# Patient Record
Sex: Male | Born: 1985
Health system: Southern US, Community
[De-identification: ages and names within clinical notes are randomized; demographics above are authoritative.]

## PROBLEM LIST (undated history)

## (undated) DIAGNOSIS — R7989 Other specified abnormal findings of blood chemistry: Secondary | ICD-10-CM

## (undated) DIAGNOSIS — M549 Dorsalgia, unspecified: Secondary | ICD-10-CM

## (undated) DIAGNOSIS — F1910 Other psychoactive substance abuse, uncomplicated: Secondary | ICD-10-CM

## (undated) DIAGNOSIS — F1990 Other psychoactive substance use, unspecified, uncomplicated: Secondary | ICD-10-CM

## (undated) DIAGNOSIS — R945 Abnormal results of liver function studies: Secondary | ICD-10-CM

---
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
## 2009-08-03 ENCOUNTER — Emergency Department (HOSPITAL_COMMUNITY): Admission: EM | Admit: 2009-08-03 | Discharge: 2009-08-04 | Payer: Self-pay | Admitting: Emergency Medicine

## 2009-11-16 ENCOUNTER — Emergency Department (HOSPITAL_COMMUNITY): Admission: EM | Admit: 2009-11-16 | Discharge: 2009-11-16 | Payer: Self-pay | Admitting: Family Medicine

## 2010-01-11 ENCOUNTER — Emergency Department (HOSPITAL_COMMUNITY): Admission: EM | Admit: 2010-01-11 | Discharge: 2010-01-11 | Payer: Self-pay | Admitting: Emergency Medicine

## 2010-09-11 ENCOUNTER — Emergency Department (HOSPITAL_COMMUNITY)
Admission: EM | Admit: 2010-09-11 | Discharge: 2010-09-11 | Payer: BC Managed Care – PPO | Source: Home / Self Care | Admitting: Emergency Medicine

## 2010-11-10 ENCOUNTER — Inpatient Hospital Stay (INDEPENDENT_AMBULATORY_CARE_PROVIDER_SITE_OTHER)
Admission: RE | Admit: 2010-11-10 | Discharge: 2010-11-10 | Disposition: A | Discharge status: Home / Self Care | Payer: BC Managed Care – PPO | Source: Ambulatory Visit | Attending: Family Medicine | Admitting: Family Medicine

## 2010-11-10 DIAGNOSIS — M549 Dorsalgia, unspecified: Secondary | ICD-10-CM

## 2010-11-28 ENCOUNTER — Emergency Department (HOSPITAL_COMMUNITY)
Admission: EM | Admit: 2010-11-28 | Discharge: 2010-11-28 | Disposition: A | Discharge status: Home / Self Care | Payer: BC Managed Care – PPO | Attending: Emergency Medicine | Admitting: Emergency Medicine

## 2010-11-28 DIAGNOSIS — K029 Dental caries, unspecified: Secondary | ICD-10-CM | POA: Insufficient documentation

## 2010-11-28 DIAGNOSIS — K089 Disorder of teeth and supporting structures, unspecified: Secondary | ICD-10-CM | POA: Insufficient documentation

## 2011-02-09 ENCOUNTER — Emergency Department (HOSPITAL_COMMUNITY)
Admission: EM | Admit: 2011-02-09 | Discharge: 2011-02-10 | Disposition: A | Discharge status: Home / Self Care | Payer: Self-pay | Attending: Emergency Medicine | Admitting: Emergency Medicine

## 2011-02-09 DIAGNOSIS — R112 Nausea with vomiting, unspecified: Secondary | ICD-10-CM | POA: Insufficient documentation

## 2011-02-09 DIAGNOSIS — R10819 Abdominal tenderness, unspecified site: Secondary | ICD-10-CM | POA: Insufficient documentation

## 2011-02-09 DIAGNOSIS — R1013 Epigastric pain: Secondary | ICD-10-CM | POA: Insufficient documentation

## 2011-02-09 DIAGNOSIS — R630 Anorexia: Secondary | ICD-10-CM | POA: Insufficient documentation

## 2011-02-09 DIAGNOSIS — R197 Diarrhea, unspecified: Secondary | ICD-10-CM | POA: Insufficient documentation

## 2011-02-09 LAB — URINALYSIS, ROUTINE W REFLEX MICROSCOPIC
Bilirubin Urine: NEGATIVE
Ketones, ur: NEGATIVE mg/dL
Nitrite: NEGATIVE
Protein, ur: NEGATIVE mg/dL
Urobilinogen, UA: 1 mg/dL (ref 0.0–1.0)

## 2011-02-09 LAB — BASIC METABOLIC PANEL
BUN: 14 mg/dL (ref 6–23)
CO2: 30 mEq/L (ref 19–32)
Calcium: 9.2 mg/dL (ref 8.4–10.5)
Chloride: 104 mEq/L (ref 96–112)
Creatinine, Ser: 1.08 mg/dL (ref 0.4–1.5)
GFR calc Af Amer: >60 mL/min (ref >60)
GFR calc non Af Amer: >60 mL/min (ref >60)
Glucose, Bld: 98 mg/dL (ref 70–99)
Potassium: 4.2 mEq/L (ref 3.5–5.1)
Sodium: 141 mEq/L (ref 135–145)

## 2011-02-09 LAB — CBC
HCT: 43 % (ref 39.0–52.0)
Hemoglobin: 15.4 g/dL (ref 13.0–17.0)
MCH: 30.2 pg (ref 26.0–34.0)
MCHC: 35.8 g/dL (ref 30.0–36.0)
MCV: 84.3 fL (ref 78.0–100.0)
Platelets: 191 10*3/uL (ref 150–400)
RBC: 5.1 MIL/uL (ref 4.22–5.81)
RDW: 12.4 % (ref 11.5–15.5)
WBC: 11.9 10*3/uL — ABNORMAL HIGH (ref 4.0–10.5)

## 2011-02-09 LAB — HEPATIC FUNCTION PANEL
ALT: 17 U/L (ref 0–53)
AST: 25 U/L (ref 0–37)
Alkaline Phosphatase: 61 U/L (ref 39–117)

## 2011-02-09 LAB — DIFFERENTIAL
Basophils Absolute: 0 10*3/uL (ref 0.0–0.1)
Basophils Relative: 0 % (ref 0–1)
Eosinophils Absolute: 0.4 10*3/uL (ref 0.0–0.7)
Eosinophils Relative: 3 % (ref 0–5)
Lymphocytes Relative: 22 % (ref 12–46)
Lymphs Abs: 2.6 10*3/uL (ref 0.7–4.0)
Monocytes Absolute: 1.3 10*3/uL — ABNORMAL HIGH (ref 0.1–1.0)
Monocytes Relative: 11 % (ref 3–12)
Neutro Abs: 7.6 10*3/uL (ref 1.7–7.7)
Neutrophils Relative %: 64 % (ref 43–77)

## 2011-02-26 ENCOUNTER — Emergency Department (HOSPITAL_COMMUNITY)
Admission: EM | Admit: 2011-02-26 | Discharge: 2011-02-26 | Disposition: A | Discharge status: Home / Self Care | Payer: Self-pay | Attending: Emergency Medicine | Admitting: Emergency Medicine

## 2011-02-26 ENCOUNTER — Emergency Department (HOSPITAL_COMMUNITY): Payer: Self-pay

## 2011-02-26 DIAGNOSIS — M79609 Pain in unspecified limb: Secondary | ICD-10-CM | POA: Insufficient documentation

## 2011-02-26 DIAGNOSIS — W311XXA Contact with metalworking machines, initial encounter: Secondary | ICD-10-CM | POA: Insufficient documentation

## 2011-02-26 DIAGNOSIS — Y990 Civilian activity done for income or pay: Secondary | ICD-10-CM | POA: Insufficient documentation

## 2011-02-26 DIAGNOSIS — S61409A Unspecified open wound of unspecified hand, initial encounter: Secondary | ICD-10-CM | POA: Insufficient documentation

## 2011-02-26 DIAGNOSIS — S6990XA Unspecified injury of unspecified wrist, hand and finger(s), initial encounter: Secondary | ICD-10-CM | POA: Insufficient documentation

## 2011-04-15 ENCOUNTER — Emergency Department (HOSPITAL_COMMUNITY)
Admission: EM | Admit: 2011-04-15 | Discharge: 2011-04-15 | Disposition: A | Discharge status: Home / Self Care | Payer: Self-pay | Attending: Emergency Medicine | Admitting: Emergency Medicine

## 2011-04-15 ENCOUNTER — Emergency Department (HOSPITAL_COMMUNITY): Payer: Self-pay

## 2011-04-15 DIAGNOSIS — R220 Localized swelling, mass and lump, head: Secondary | ICD-10-CM | POA: Insufficient documentation

## 2011-04-15 DIAGNOSIS — R6884 Jaw pain: Secondary | ICD-10-CM | POA: Insufficient documentation

## 2011-04-15 DIAGNOSIS — S02609A Fracture of mandible, unspecified, initial encounter for closed fracture: Secondary | ICD-10-CM | POA: Insufficient documentation

## 2011-04-15 DIAGNOSIS — R51 Headache: Secondary | ICD-10-CM | POA: Insufficient documentation

## 2011-09-25 ENCOUNTER — Emergency Department (HOSPITAL_COMMUNITY)
Admission: EM | Admit: 2011-09-25 | Discharge: 2011-09-25 | Disposition: A | Discharge status: Home / Self Care | Payer: BC Managed Care – PPO | Attending: Emergency Medicine | Admitting: Emergency Medicine

## 2011-09-25 ENCOUNTER — Encounter (HOSPITAL_COMMUNITY): Payer: Self-pay | Admitting: *Deleted

## 2011-09-25 DIAGNOSIS — K089 Disorder of teeth and supporting structures, unspecified: Secondary | ICD-10-CM | POA: Insufficient documentation

## 2011-09-25 DIAGNOSIS — K047 Periapical abscess without sinus: Secondary | ICD-10-CM

## 2011-09-25 DIAGNOSIS — R6884 Jaw pain: Secondary | ICD-10-CM | POA: Insufficient documentation

## 2011-09-25 DIAGNOSIS — K029 Dental caries, unspecified: Secondary | ICD-10-CM | POA: Insufficient documentation

## 2011-09-25 DIAGNOSIS — K044 Acute apical periodontitis of pulpal origin: Secondary | ICD-10-CM | POA: Insufficient documentation

## 2011-09-25 MED ORDER — PENICILLIN V POTASSIUM 500 MG PO TABS: 500.0000 mg | ORAL_TABLET | Freq: Three times a day (TID) | ORAL | Status: AC

## 2011-09-25 MED ORDER — HYDROCODONE-ACETAMINOPHEN 5-325 MG PO TABS: 1.0000 | ORAL_TABLET | ORAL | Status: AC | PRN

## 2011-09-25 NOTE — ED Notes (Signed)
The pt has a  Toothache for 3 days .

## 2011-09-25 NOTE — ED Notes (Signed)
Patient states that he has lost filling on this left lower side of his mouth in two teeth. Pt states that he has had increase in pain to same x 3 days.  Pt is not established with a dentist. Pt does have dark spots noted to teeth at this time.

## 2011-09-25 NOTE — ED Provider Notes (Signed)
History     CSN: [id number]  Arrival date & time 09/25/11  0800   First MD Initiated Contact with Patient 09/25/11 [phone]      Chief Complaint  Patient presents with  . Dental Pain    (Consider location/radiation/quality/duration/timing/severity/associated sxs/prior treatment) HPI Comments: Pt with 3 days of L lower jaw pain where he knows he has had prior dental fillings and dental work.  Pt with no fevers.  Has increased pain with eating and changes in temp.  Otherwise no SOB or difficulty swallowing.  Has tried ibuprofen for pain.    Patient is a 26 y.o. male presenting with tooth pain. The history is provided by the patient. No language interpreter was used.  Dental PainThe primary symptoms include mouth pain. Primary symptoms do not include headaches, fever, shortness of breath or cough.    History reviewed. No pertinent past medical history.  History reviewed. No pertinent past surgical history.  History reviewed. No pertinent family history.  History  Substance Use Topics  . Smoking status: Current Everyday Smoker  . Smokeless tobacco: Not on file  . Alcohol Use: Yes      Review of Systems  Constitutional: Negative.  Negative for fever and chills.  HENT: Positive for dental problem.   Eyes: Negative.  Negative for discharge and redness.  Respiratory: Negative.  Negative for cough and shortness of breath.   Cardiovascular: Negative.  Negative for chest pain.  Gastrointestinal: Negative.  Negative for nausea, vomiting and abdominal pain.  Genitourinary: Negative.  Negative for hematuria.  Musculoskeletal: Negative.  Negative for back pain.  Skin: Negative.  Negative for color change and rash.  Neurological: Negative for syncope and headaches.  Hematological: Negative.  Negative for adenopathy.  Psychiatric/Behavioral: Negative.  Negative for confusion.  All other systems reviewed and are negative.    Allergies  Tramadol  Home Medications   Current  Outpatient Rx  Name Route Sig Dispense Refill  . HYDROCODONE-ACETAMINOPHEN 5-325 MG PO TABS Oral Take 1 tablet by mouth every 4 (four) hours as needed for pain. 10 tablet 0  . PENICILLIN V POTASSIUM 500 MG PO TABS Oral Take 1 tablet (500 mg total) by mouth 3 (three) times daily. 30 tablet 0    BP 139/69  Pulse 76  Temp(Src) 97.7 F (36.5 C) (Oral)  Resp 20  SpO2 99%  Physical Exam  Nursing note and vitals reviewed. Constitutional: He is oriented to person, place, and time. He appears well-developed and well-nourished.  Non-toxic appearance. He does not have a sickly appearance.  HENT:  Head: Normocephalic and atraumatic.       L lower tooth has erythema and mild decay with tenderness to palpation.  No significant facial swellling.    Eyes: Conjunctivae, EOM and lids are normal. Pupils are equal, round, and reactive to light.  Neck: Trachea normal, normal range of motion and full passive range of motion without pain. Neck supple.  Cardiovascular: Normal rate.   Pulmonary/Chest: Effort normal.  Abdominal: Soft. Normal appearance. There is no CVA tenderness.  Musculoskeletal: Normal range of motion.  Neurological: He is alert and oriented to person, place, and time. He has normal strength.  Skin: Skin is warm, dry and intact. No rash noted.  Psychiatric: He has a normal mood and affect. His behavior is normal. Judgment and thought content normal.    ED Course  Procedures (including critical care time)  Labs Reviewed - No data to display No results found.   1. Dental infection  MDM  Pt with early dental abscess with redness at gums and tenderness.  Will place on penicillin and pain meds and given instruction to f/u with dentist this week.  Call tomorrow for appt.  He understands to return for worsening redness, swelling, pain.          Nat Christen, MD 09/25/11 [phone]

## 2011-12-14 ENCOUNTER — Encounter (HOSPITAL_COMMUNITY): Payer: Self-pay | Admitting: *Deleted

## 2011-12-14 ENCOUNTER — Emergency Department (HOSPITAL_COMMUNITY)
Admission: EM | Admit: 2011-12-14 | Discharge: 2011-12-14 | Disposition: A | Discharge status: Home / Self Care | Payer: Self-pay | Attending: Emergency Medicine | Admitting: Emergency Medicine

## 2011-12-14 ENCOUNTER — Emergency Department (HOSPITAL_COMMUNITY): Payer: Self-pay

## 2011-12-14 DIAGNOSIS — M545 Low back pain, unspecified: Secondary | ICD-10-CM | POA: Insufficient documentation

## 2011-12-14 DIAGNOSIS — S39012A Strain of muscle, fascia and tendon of lower back, initial encounter: Secondary | ICD-10-CM

## 2011-12-14 DIAGNOSIS — W1789XA Other fall from one level to another, initial encounter: Secondary | ICD-10-CM | POA: Insufficient documentation

## 2011-12-14 DIAGNOSIS — S335XXA Sprain of ligaments of lumbar spine, initial encounter: Secondary | ICD-10-CM | POA: Insufficient documentation

## 2011-12-14 DIAGNOSIS — Y9239 Other specified sports and athletic area as the place of occurrence of the external cause: Secondary | ICD-10-CM | POA: Insufficient documentation

## 2011-12-14 MED ORDER — IBUPROFEN 600 MG PO TABS: 600.0000 mg | ORAL_TABLET | Freq: Four times a day (QID) | ORAL | Status: AC | PRN

## 2011-12-14 MED ORDER — HYDROCODONE-ACETAMINOPHEN 5-500 MG PO TABS: 1.0000 | ORAL_TABLET | Freq: Four times a day (QID) | ORAL | Status: AC | PRN

## 2011-12-14 MED ORDER — HYDROCODONE-ACETAMINOPHEN 5-325 MG PO TABS
2.0000 | ORAL_TABLET | Freq: Once | ORAL | Status: AC
  Administered 2011-12-14: 2 via ORAL
  Filled 2011-12-14: qty 2

## 2011-12-14 NOTE — ED Notes (Signed)
To ed for eval of lower back pain since falling out of a bounce house yesterday. Ambulatory into triage. Denies numbness or tingling. Appears in nad.

## 2011-12-14 NOTE — Discharge Instructions (Signed)
Take motrin as prescribed.  You may  take vicodin as need for pain. No driving for the next 6 hours or when taking vicodin. Also, do not take tylenol or acetaminophen containing medication when taking vicodin. Avoid bending at waist or heavy lifting more than 20 lbs for the next week. Try heat/heating pad to sore area. Follow up with primary care doctor in coming wee for recheck - discuss referral to back specialist and/or further workup if symptoms fail to improve/resolve. Return to ER if worse, leg numbness/weakness, intractable pain, other concern.     Back Pain, Adult Low back pain is very common. About 1 in 5 people have back pain.The cause of low back pain is rarely dangerous. The pain often gets better over time.About half of people with a sudden onset of back pain feel better in just 2 weeks. About 8 in 10 people feel better by 6 weeks.  CAUSES Some common causes of back pain include:  Strain of the muscles or ligaments supporting the spine.   Wear and tear (degeneration) of the spinal discs.   Arthritis.   Direct injury to the back.  DIAGNOSIS Most of the time, the direct cause of low back pain is not known.However, back pain can be treated effectively even when the exact cause of the pain is unknown.Answering your caregiver's questions about your overall health and symptoms is one of the most accurate ways to make sure the cause of your pain is not dangerous. If your caregiver needs more information, he or she may order lab work or imaging tests (X-rays or MRIs).However, even if imaging tests show changes in your back, this usually does not require surgery. HOME CARE INSTRUCTIONS For many people, back pain returns.Since low back pain is rarely dangerous, it is often a condition that people can learn to  [facility] their own.   Remain active. It is stressful on the back to sit or stand in one place. Do not sit, drive, or stand in one place for more than 30 minutes at a time. Take  short walks on level surfaces as soon as pain allows.Try to increase the length of time you walk each day.   Do not stay in bed.Resting more than 1 or 2 days can delay your recovery.   Do not avoid exercise or work.Your body is made to move.It is not dangerous to be active, even though your back may hurt.Your back will likely heal faster if you return to being active before your pain is gone.   Pay attention to your body when you bend and lift. Many people have less discomfortwhen lifting if they bend their knees, keep the load close to their bodies,and avoid twisting. Often, the most comfortable positions are those that put less stress on your recovering back.   Find a comfortable position to sleep. Use a firm mattress and lie on your side with your knees slightly bent. If you lie on your back, put a pillow under your knees.   Only take over-the-counter or prescription medicines as directed by your caregiver. Over-the-counter medicines to reduce pain and inflammation are often the most helpful.Your caregiver may prescribe muscle relaxant drugs.These medicines help dull your pain so you can more quickly return to your normal activities and healthy exercise.   Put ice on the injured area.   Put ice in a plastic bag.   Place a towel between your skin and the bag.   Leave the ice on for 15 to 20 minutes, 3 to  4 times a day for the first 2 to 3 days. After that, ice and heat may be alternated to reduce pain and spasms.   Ask your caregiver about trying back exercises and gentle massage. This may be of some benefit.   Avoid feeling anxious or stressed.Stress increases muscle tension and can worsen back pain.It is important to recognize when you are anxious or stressed and learn ways to manage it.Exercise is a great option.  SEEK MEDICAL CARE IF:  You have pain that is not relieved with rest or medicine.   You have pain that does not improve in 1 week.   You have new symptoms.    You are generally not feeling well.  SEEK IMMEDIATE MEDICAL CARE IF:   You have pain that radiates from your back into your legs.   You develop new bowel or bladder control problems.   You have unusual weakness or numbness in your arms or legs.   You develop nausea or vomiting.   You develop abdominal pain.   You feel faint.  Document Released: 08/15/2005 Document Revised: 08/04/2011 Document Reviewed: 01/03/2011 [facility] Patient Information 2012 [employer], [place].     Lumbosacral Strain Lumbosacral strain is one of the most common causes of back pain. There are many causes of back pain. Most are not serious conditions. CAUSES  Your backbone (spinal column) is made up of 24 main vertebral bodies, the sacrum, and the coccyx. These are held together by muscles and tough, fibrous tissue (ligaments). Nerve roots pass through the openings between the vertebrae. A sudden move or injury to the back may cause injury to, or pressure on, these nerves. This may result in localized back pain or pain movement (radiation) into the buttocks, down the leg, and into the foot. Sharp, shooting pain from the buttock down the back of the leg (sciatica) is frequently associated with a ruptured (herniated) disk. Pain may be caused by muscle spasm alone. Your caregiver can often find the cause of your pain by the details of your symptoms and an exam. In some cases, you may need tests (such as X-rays). Your caregiver will work with you to decide if any tests are needed based on your specific exam. HOME CARE INSTRUCTIONS   Avoid an underactive lifestyle. Active exercise, as directed by your caregiver, is your greatest weapon against back pain.   Avoid hard physical activities (tennis, racquetball, waterskiing) if you are not in proper physical condition for it. This may aggravate or create problems.   If you have a back problem, avoid sports requiring sudden body movements. Swimming and walking are generally  safer activities.   Maintain good posture.   Avoid becoming overweight (obese).   Use bed rest for only the most extreme, sudden (acute) episode. Your caregiver will help you determine how much bed rest is necessary.   For acute conditions, you may put ice on the injured area.   Put ice in a plastic bag.   Place a towel between your skin and the bag.   Leave the ice on for 15 to 20 minutes at a time, every 2 hours, or as needed.   After you are improved and more active, it may help to apply heat for 30 minutes before activities.  See your caregiver if you are having pain that lasts longer than expected. Your caregiver can advise appropriate exercises or therapy if needed. With conditioning, most back problems can be avoided. SEEK IMMEDIATE MEDICAL CARE IF:   You have numbness,  tingling, weakness, or problems with the use of your arms or legs.   You experience severe back pain not relieved with medicines.   There is a change in bowel or bladder control.   You have increasing pain in any area of the body, including your belly (abdomen).   You notice shortness of breath, dizziness, or feel faint.   You feel sick to your stomach (nauseous), are throwing up (vomiting), or become sweaty.   You notice discoloration of your toes or legs, or your feet get very cold.   Your back pain is getting worse.   You have a fever.  MAKE SURE YOU:   Understand these instructions.   Will watch your condition.   Will get help right away if you are not doing well or get worse.  Document Released: 05/25/2005 Document Revised: 08/04/2011 Document Reviewed: 11/14/2008 [facility] Patient Information 2012 [place], [place].

## 2011-12-14 NOTE — ED Provider Notes (Signed)
History     CSN: [id number]  Arrival date & time 12/14/11  9562   First MD Initiated Contact with Patient 12/14/11 1029      Chief Complaint  Patient presents with  . Back Pain  pt states fell out of bounce house yesterday. Landed no back. C/o low back pain since. Constant, dull, nonradiating, worse w bending/turning. 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 No radicular or leg pain. No numbness/weakness. No gi or gu c/o. No fever. Denies other pain or injury.   (Consider location/radiation/quality/duration/timing/severity/associated sxs/prior treatment) Patient is a 26 y.o. male presenting with back pain. The history is provided by the patient.  Back Pain  Pertinent negatives include no fever, no numbness, no headaches and no weakness.    History reviewed. No pertinent past medical history.  History reviewed. No pertinent past surgical history.  History reviewed. No pertinent family history.  History  Substance Use Topics  . Smoking status: Current Everyday Smoker  . Smokeless tobacco: Not on file  . Alcohol Use: Yes      Review of Systems  Constitutional: Negative for fever and chills.  HENT: Negative for neck pain.   Musculoskeletal: Positive for back pain.  Skin: Negative for wound.  Neurological: Negative for weakness, numbness and headaches.    Allergies  Tramadol  Home Medications   Current Outpatient Rx  Name Route Sig Dispense Refill  . ACETAMINOPHEN 500 MG PO TABS Oral Take 1,000 mg by mouth every 6 (six) hours as needed. For pain.      BP 121/72  Pulse 72  Temp(Src) 97.7 F (36.5 C) (Oral)  Resp 20  SpO2 98%  Physical Exam  Nursing note and vitals reviewed. Constitutional: He is oriented to person, place, and time. He appears well-developed and well-nourished. No distress.  HENT:  Head: Atraumatic.  Eyes: Pupils are equal, round, and reactive to light.  Neck: Neck supple. No tracheal deviation  present.  Cardiovascular: Normal rate.   Pulmonary/Chest: Effort normal. No accessory muscle usage. No respiratory distress.  Abdominal: Soft. He exhibits no distension. There is no tenderness.  Musculoskeletal: Normal range of motion. He exhibits no edema and no tenderness.       ctls spine nt x for lumbar area where diffuse tenderness. Spine aligned, no step off.   Neurological: He is alert and oriented to person, place, and time. He displays normal reflexes.       Straight leg raise neg. Motor intact bil. Normal reflexes. Steady gait.    Skin: Skin is warm and dry.  Psychiatric: He has a normal mood and affect.    ED Course  Procedures (including critical care time)  Dg Lumbar Spine Complete  12/14/2011  *RADIOLOGY REPORT*  Clinical Data: Fall, low back pain.  LUMBAR SPINE - COMPLETE 4+ VIEW  Comparison: None  Findings: There are five lumbar-type vertebral bodies.  No fracture or malalignment.  Disc spaces well maintained.  SI joints are symmetric.  IMPRESSION: No acute findings.  Original Report Authenticated By: Cyndie Chime, M.D.      MDM  Xrays.   Pt indicates that he has ride, does not have to drive home, requests pain med.  vicodin po.       Suzi Roots, MD 12/14/11 [phone]

## 2011-12-28 ENCOUNTER — Emergency Department (HOSPITAL_COMMUNITY): Payer: Self-pay

## 2011-12-28 ENCOUNTER — Encounter (HOSPITAL_COMMUNITY): Payer: Self-pay | Admitting: *Deleted

## 2011-12-28 ENCOUNTER — Emergency Department (HOSPITAL_COMMUNITY)
Admission: EM | Admit: 2011-12-28 | Discharge: 2011-12-28 | Disposition: A | Discharge status: Home / Self Care | Payer: Self-pay | Attending: Emergency Medicine | Admitting: Emergency Medicine

## 2011-12-28 DIAGNOSIS — M25561 Pain in right knee: Secondary | ICD-10-CM

## 2011-12-28 DIAGNOSIS — M79609 Pain in unspecified limb: Secondary | ICD-10-CM | POA: Insufficient documentation

## 2011-12-28 DIAGNOSIS — F17200 Nicotine dependence, unspecified, uncomplicated: Secondary | ICD-10-CM | POA: Insufficient documentation

## 2011-12-28 DIAGNOSIS — M549 Dorsalgia, unspecified: Secondary | ICD-10-CM | POA: Insufficient documentation

## 2011-12-28 DIAGNOSIS — M542 Cervicalgia: Secondary | ICD-10-CM | POA: Insufficient documentation

## 2011-12-28 DIAGNOSIS — Y9289 Other specified places as the place of occurrence of the external cause: Secondary | ICD-10-CM | POA: Insufficient documentation

## 2011-12-28 MED ORDER — ORPHENADRINE CITRATE ER 100 MG PO TB12
100.0000 mg | ORAL_TABLET | Freq: Two times a day (BID) | ORAL | Status: AC
Start: 2011-12-28 — End: 2012-01-07
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
## 2011-12-28 NOTE — ED Provider Notes (Signed)
Medical screening examination/treatment/procedure(s) were performed by non-physician practitioner and as supervising physician I was immediately available for consultation/collaboration.   Gwyneth Sprout, MD 12/28/11 2040

## 2011-12-28 NOTE — Discharge Instructions (Signed)
Read the information below.  Follow the care instructions listed below, including rest, ice, compression, and elevation of your knee.  Use the resources below to find a primary care provider for follow up if you do not have a primary care provider.  You may return to the ER at any time for worsening condition or any new symptoms that concern you.  Knee Pain Knee pain can be a result of an injury or other medical conditions. Treatment will depend on the cause of your pain. HOME CARE  Only take medicine as told by your doctor.   Keep a healthy weight. Being overweight can make the knee hurt more.   Stretch before exercising or playing sports.   If there is constant knee pain, change the way you exercise. Ask your doctor for advice.   Make sure shoes fit well. Choose the right shoe for the sport or activity.   Protect your knees. Wear kneepads if needed.   Rest when you are tired.  GET HELP RIGHT AWAY IF:   Your knee pain does not stop.   Your knee pain does not get better.   Your knee joint feels hot to the touch.   You have a temperature by mouth above 102 F (38.9 C), not controlled by medicine.   Your baby is older than 3 months with a rectal temperature of 102 F (38.9 C) or higher.   Your baby is 76 months old or younger with a rectal temperature of 100.4 F (38 C) or higher.  MAKE SURE YOU:   Understand these instructions.   Will watch this condition.   Will get help right away if you are not doing well or get worse.  Document Released: 11/11/2008 Document Revised: 08/04/2011 Document Reviewed: 11/11/2008 [facility] Patient Information 2012 [place], [place].  Knee Wraps (Elastic Bandage) and RICE Knee wraps come in many different shapes and sizes and perform many different functions. Some wraps may provide cold therapy or warmth. Your caregiver will help you to determine what is best for your protection, or recovery following your injury. The following are some general  tips to help you use a knee wrap:  Use the wrap as directed.   Do not keep the wrap so tight that it cuts off the circulation of the leg below the wrap.   If your lower leg becomes blue, loses feeling, or becomes swollen below the wrap, it is probably too tight. Loosen the wrap as needed to improve these problems.   See your caregiver or trainer if the wrap seems to be making your problems worse rather than better.  Wraps in general help to remind you that you have an injury. They provide limited support. The few pounds of support they provide are minimal considering the hundreds of pounds of pressure it takes to injure a joint or tear ligaments.  The routine care of many injuries includes Rest, Ice, Compression, and Elevation (RICE).  Rest is required to allow your body to heal. Generally following bumps and bruises, routine activities can be resumed when comfortable. Injured tendons (cord-like structures that attach muscle to bone) and bones take approximately 6 to 12 weeks to heal.   Ice following an injury helps keep the swelling down and reduces pain. Do not apply ice directly to skin. Apply ice bags for 20-30 minutes every 3-4 hours for the first 2-3 days following injury or surgery. Place ice in a plastic bag with a towel around it.   Compression helps keep swelling  down, gives support, and helps with discomfort. If a knee wrap has been applied, it should be removed and reapplied every 3 to 4 hours. It should be applied firmly enough to keep swelling down, but not too tightly. Watch your lower leg and toes for swelling, bluish discoloration, coldness, numbness or excessive pain. If any of these symptoms (problems) occur, remove the knee wrap and reapply more loosely. If these symptoms persist, contact your caregiver immediately.   Elevation helps reduce swelling, and decreases pain. With extremities (arms/hands and legs/feet), the injured area should be placed near to or above the level of  the heart if possible.  Persistent pain and inability to use the injured area for more than 2 to 3 days are warning signs indicating that you should see a caregiver for a follow-up visit as soon as possible. Initially, a hairline fracture (this is the same as a broken bone) may not be seen on x-rays.  Persistent pain and swelling mean limitedweight bearing (use of crutches as instructed) should continue. You may need further x-rays.  X-rays may not show a non-displaced fracture until a week or ten days later. Make a follow-up appointment with your caregiver. A radiologist (a specialist in reading x-rays) will re-read your x-rays. Make sure you know how to get your x-ray results. Do not assume everything is normal if you do not hear from your caregiver. MAKE SURE YOU:   Understand these instructions.   Will watch your condition.   Will get help right away if you are not doing well or get worse.  Document Released: 02/04/2002 Document Revised: 08/04/2011 Document Reviewed: 12/04/2008 [facility] Patient Information 2012 [place], [place].  If you have no primary doctor, here are some resources that may be helpful:  Medicaid-accepting [facility] Providers:   - [facility]- [address]      [phone]      Mon-Fri 9am-7pm, Sat 9am-1pm   - [facility]- [address] [place], [place]      [phone]   - [facility]- [address] [place]      [phone]   [facility]- [address]      [phone]   - Renaye Rakers- [address] [place], [address]      [phone]      Only accepts [place] patients       after they have her name applied to their card   Self Pay (no insurance) in [place]:   - Sickle Cell Patients: Dr Willey Blade, [facility] Internal [facility]      [address] [place]      [phone]   - Health [phone]   - Physician Referral Service- [phone]   - [facility] Urgent Care- [address] [place]      [phone]   [facility]- [address]   - [facility]- see information above      (Speak to [employer] if you do not have insurance)   - [facility]- [address] [place]      [phone]   - [facility] [place]- [address]      [phone]   - [facility]- [address]      [phone]   - Dr Julio Sicks-  [address], [place]  [phone]   [facility]- [address]      [phone]   - [facility]- [address]      [phone]      Also [address]      [phone]   - [facility]- [address]      [phone]      1st and 3rd Saturday every month, 10am-1pm Other agencies that provide inexpensive medical care:    [facility]  [phone]    [facility]  [phone]    [facility]  [phone]    [facility]  [phone]    [facility]  [phone]  General Information: Finding a doctor when you do not have health insurance can be tricky. Although you are not limited by an insurance plan, you are of course limited by her finances and how much but he can pay out of pocket.  What are your options if you don't have health insurance?   1) Find a Librarian, academic and Pay Out of Pocket Although you won't have to find out who is covered by your insurance plan, it is a good idea to ask around and get recommendations. You will then need to call the office and see if the doctor you have chosen will accept you as a new patient and what types of options they offer for patients who are self-pay. Some doctors offer discounts or will set up payment plans for their patients who do not have insurance, but you will need to ask so you aren't surprised when you get to your appointment.  2) Contact Your Local Health Department Not all health departments have doctors that can see patients  for sick visits, but many do, so it is worth a call to see if yours does. If you don't know where your local health department is, you can check in your phone book. The CDC also has a tool to help you locate your state's health department, and many state websites also have listings of all of their local health departments.  3) Find a Walk-in Clinic If your illness is not likely to be very severe or complicated, you may want to try a walk in clinic. These are popping up all over the country in pharmacies, drugstores, and shopping centers. They're usually staffed by nurse practitioners or physician assistants that have been trained to treat common illnesses and complaints. They're usually fairly quick and inexpensive. However, if you have serious medical issues or chronic medical problems, these are probably not your best option   RESOURCE GUIDE  Dental Problems  Patients with Medicaid: [facility] [phone] [address]                                           [phone] [address] [employer] [address] Phone:  [phone]                                                  Phone:  [phone]  If unable to pay or uninsured, contact:  [facility] or [address]  [facility]. to become qualified for the adult dental clinic.  Chronic Pain Problems Contact [facility]  [phone] Patients need to be referred by their primary care doctor.  Insufficient Money for Medicine Contact United Way:  call "211" or [facility] [phone].  No Primary Care Doctor Call [facility]  [phone] Other agencies that provide inexpensive medical care    [facility]  [phone]    [facility]  [phone]    [facility]  [phone]    [facility]  [phone]    [facility]  [phone]    [facility]  [phone]  Psychological Services [facility]  [phone] [facility]  [phone] [facility]   [phone] (emergency services [phone])  Substance Abuse Resources [facility]  [phone] [facility] [phone] [facility] [place] [phone] Floydene Flock [phone] Residential & Outpatient Substance Abuse Program  [phone]  Abuse/Neglect [facility] Child Abuse Hotline 432-599-2920 [facility] Child Abuse Hotline [phone] (After Hours)  Emergency Shelter [facility] [phone]  Maternity Homes Room at the [place] [facility] [place] [phone] [facility] Services [phone]  MRSA Hotline #:   469 301 7930    Shel[facility] Resources  [facility]     United Way                          [facility] Dept. [address] [place]                       [address]      [address]  Blondell Reveal Phone:  [phone]                                   Phone:  [phone]                 Phone:  [phone]  [facility] Phone:  [phone]  [facility] Child Abuse Hotline 534-454-5318 [phone] (After Hours)

## 2011-12-28 NOTE — Progress Notes (Signed)
Orthopedic Tech Progress Note Patient Details:  Boluwatife Flight [birth year] [id number]  Other Ortho Devices Type of Ortho Device: Crutches;Knee Sleeve Ortho Device Location: right knee Ortho Device Interventions: Application   Trinitie Mcgirr T 12/28/2011, 9:18 AM
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
## 2012-03-06 ENCOUNTER — Emergency Department (HOSPITAL_COMMUNITY): Payer: Self-pay

## 2012-03-06 ENCOUNTER — Emergency Department (HOSPITAL_COMMUNITY)
Admission: EM | Admit: 2012-03-06 | Discharge: 2012-03-06 | Disposition: A | Discharge status: Home / Self Care | Payer: Self-pay | Attending: Emergency Medicine | Admitting: Emergency Medicine

## 2012-03-06 ENCOUNTER — Encounter (HOSPITAL_COMMUNITY): Payer: Self-pay | Admitting: Emergency Medicine

## 2012-03-06 DIAGNOSIS — S93601A Unspecified sprain of right foot, initial encounter: Secondary | ICD-10-CM

## 2012-03-06 DIAGNOSIS — S93609A Unspecified sprain of unspecified foot, initial encounter: Secondary | ICD-10-CM | POA: Insufficient documentation

## 2012-03-06 DIAGNOSIS — W19XXXA Unspecified fall, initial encounter: Secondary | ICD-10-CM

## 2012-03-06 DIAGNOSIS — IMO0002 Reserved for concepts with insufficient information to code with codable children: Secondary | ICD-10-CM | POA: Insufficient documentation

## 2012-03-06 DIAGNOSIS — W1789XA Other fall from one level to another, initial encounter: Secondary | ICD-10-CM | POA: Insufficient documentation

## 2012-03-06 DIAGNOSIS — S80212A Abrasion, left knee, initial encounter: Secondary | ICD-10-CM

## 2012-03-06 MED ORDER — HYDROCODONE-ACETAMINOPHEN 5-325 MG PO TABS
2.0000 | ORAL_TABLET | Freq: Once | ORAL | Status: AC
  Administered 2012-03-06: 2 via ORAL
  Filled 2012-03-06: qty 2

## 2012-03-06 MED ORDER — IBUPROFEN 400 MG PO TABS
800.0000 mg | ORAL_TABLET | Freq: Once | ORAL | Status: AC
  Administered 2012-03-06: 800 mg via ORAL
  Filled 2012-03-06: qty 2

## 2012-03-06 MED ORDER — HYDROCODONE-ACETAMINOPHEN 5-325 MG PO TABS: ORAL_TABLET | ORAL | Status: DC

## 2012-03-06 MED ORDER — HYDROCODONE-ACETAMINOPHEN 10-325 MG PO TABS: 2.0000 | ORAL_TABLET | Freq: Once | ORAL | Status: DC

## 2012-03-06 NOTE — ED Notes (Signed)
Pt c/o right foot pain after twisting foot today; pt sts some swelling noted

## 2012-03-06 NOTE — ED Notes (Signed)
Pt to XR

## 2012-03-06 NOTE — Progress Notes (Signed)
Orthopedic Tech Progress Note Patient Details:  Jon Castro 1986-04-12 [id number]  Ortho Devices Type of Ortho Device: Crutches;Postop boot Ortho Device/Splint Location: (R) LE Ortho Device/Splint Interventions: Ordered;Application   Jennye Moccasin 03/06/2012, 6:10 PM

## 2012-03-06 NOTE — ED Notes (Signed)
Pt fell from porch while playing with child, states he "rolled his foot" also has abrasions to left knee.

## 2012-03-06 NOTE — ED Provider Notes (Signed)
History  This chart was scribed for Ward Givens, MD by Erskine Emery. This patient was seen in room TR04C/TR04C and the patient's care was started at 16:18.   CSN: [id number]  Arrival date & time 03/06/12  1547   First MD Initiated Contact with Patient 03/06/12 1618      Chief Complaint  Patient presents with  . Foot Pain    (Consider location/radiation/quality/duration/timing/severity/associated sxs/prior treatment) HPI Jon Castro is a 26 y.o. male who presents to the Emergency Department complaining of constant moderate right foot pain with associated swelling with an onset of 10 minutes PTA. Pt reports he stepped on the outer side of his foot while walking down steps and heard a pop. Pt also has an abrasion on his left knee but no pain in the right knee. Pt denies LOC or hitting his head.   Pt has [name] and went to an orthopedist near the [facility] hospital a number of years ago when he broke his toe.    History reviewed. No pertinent past medical history.  History reviewed. No pertinent past surgical history.  History reviewed. No pertinent family history.  History  Substance Use Topics  . Smoking status: Current Everyday Smoker  . Smokeless tobacco: Not on file  . Alcohol Use: Yes   Pt denies he is a habitual smoker. 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    Review of Systems  Musculoskeletal: Positive for arthralgias.  Skin: Positive for wound.    Allergies  Codeine and Tramadol  Home Medications  No current outpatient prescriptions on file.  Vital Signs: BP 122/77  Pulse 97  Temp 98.5 F (36.9 C) (Oral)  Resp 18  SpO2 96%  Vital signs normal    Physical Exam  Nursing note and vitals reviewed. Constitutional: He is oriented to person, place, and time. He appears well-developed and well-nourished. No distress.  HENT:  Head: Normocephalic and atraumatic.  Right Ear: External ear normal.  Left Ear: External ear normal.    Eyes: Conjunctivae and EOM are normal. Pupils are equal, round, and reactive to light.  Neck: Normal range of motion. Neck supple. No tracheal deviation present.  Cardiovascular: Normal rate and regular rhythm.        Good pedal pulse  Pulmonary/Chest: Effort normal. No respiratory distress.  Musculoskeletal: Normal range of motion. He exhibits tenderness.       No effusion of the right knee or left knee. Right knee non tender. No right lower leg tenderness or malleolus tenderness. Moderate swelling and ecchymosis laterally of right foot with diffuse tenderness.   Neurological: He is alert and oriented to person, place, and time.  Skin: Skin is warm and dry.       Superficial  abrasions of left knee. Normal toe color and warmth.   Psychiatric: He has a normal mood and affect. His behavior is normal.    ED Course  Procedures (including critical care time)  Pt placed on crutches with post op shoe  Meds ordered this encounter  Medications  . ibuprofen (ADVIL,MOTRIN) tablet 800 mg    Sig:   . HYDROcodone-acetaminophen (NORCO) 10-325 MG per tablet 2 tablet    Sig:   . HYDROcodone-acetaminophen (NORCO) 5-325 MG per tablet    Sig: Take 1 or 2 po Q 6hrs for pain    Dispense:  16 tablet    Refill:  0    DIAGNOSTIC STUDIES: Oxygen Saturation is 96% on room air, adequate  by my interpretation.    COORDINATION OF CARE:  17: 42--I discussed treatment plan including a shoe to protect the foot, crutches, and pain medication, with pt and pt agreed. I informed the pt of the results of his x-ray. I instructed the pt to follow up with an orthopedist.   Dg Foot Complete Right  03/06/2012  *RADIOLOGY REPORT*  Clinical Data: Right foot pain in the region of the fourth and fifth metatarsals following an injury today.  RIGHT FOOT COMPLETE - 3+ VIEW  Comparison: None.  Findings: Normal appearing bones and soft tissues without fracture or dislocation.  IMPRESSION: Normal examination.  Original Report  Authenticated By: Darrol Angel, M.D.      1. Sprain of right foot   2. Fall   3. Abrasion of knee, left    Plan discharge  New Prescriptions   HYDROCODONE-ACETAMINOPHEN (NORCO) 5-325 MG PER TABLET    Take 1 or 2 po Q 6hrs for pain  ibuprofen OTC  Devoria Albe, MD, FACEP    MDM   I personally performed the services described in this documentation, which was scribed in my presence. The recorded information has been reviewed and considered.  Devoria Albe, MD, Armando Gang         Ward Givens, MD 03/06/12 [phone]

## 2012-03-06 NOTE — ED Notes (Signed)
Ortho Tech paged.

## 2012-03-06 NOTE — ED Notes (Signed)
NAD noted at time of d/c home. Pt verbalized understanding of d/c inst. 

## 2012-04-19 ENCOUNTER — Emergency Department (HOSPITAL_COMMUNITY)
Admission: EM | Admit: 2012-04-19 | Discharge: 2012-04-19 | Disposition: A | Discharge status: Home / Self Care | Payer: Self-pay | Attending: Emergency Medicine | Admitting: Emergency Medicine

## 2012-04-19 ENCOUNTER — Encounter (HOSPITAL_COMMUNITY): Payer: Self-pay | Admitting: Emergency Medicine

## 2012-04-19 DIAGNOSIS — K089 Disorder of teeth and supporting structures, unspecified: Secondary | ICD-10-CM | POA: Insufficient documentation

## 2012-04-19 DIAGNOSIS — F17200 Nicotine dependence, unspecified, uncomplicated: Secondary | ICD-10-CM | POA: Insufficient documentation

## 2012-04-19 DIAGNOSIS — K0889 Other specified disorders of teeth and supporting structures: Secondary | ICD-10-CM

## 2012-04-19 MED ORDER — OXYCODONE-ACETAMINOPHEN 5-325 MG PO TABS
1.0000 | ORAL_TABLET | Freq: Once | ORAL | Status: AC
  Administered 2012-04-19: 1 via ORAL
  Filled 2012-04-19: qty 1

## 2012-04-19 MED ORDER — OXYCODONE-ACETAMINOPHEN 5-325 MG PO TABS: 1.0000 | ORAL_TABLET | ORAL | Status: AC | PRN

## 2012-04-19 MED ORDER — PENICILLIN V POTASSIUM 500 MG PO TABS: 500.0000 mg | ORAL_TABLET | Freq: Four times a day (QID) | ORAL | Status: AC

## 2012-04-19 NOTE — ED Notes (Signed)
PT. REPROTS PROGRESSING LEFT LOWER MOLAR PAIN / SWELLING FOR SEVERAL DAYS WORSE THIS EVENING .

## 2012-04-19 NOTE — ED Provider Notes (Signed)
History     CSN: [id number]  Arrival date & time 04/19/12  4098   First MD Initiated Contact with Patient 04/19/12 [phone]      No chief complaint on file.   (Consider location/radiation/quality/duration/timing/severity/associated sxs/prior treatment) HPI Comments: Patient reports second lower molar pain x 2 days.  Pain is throbbing radiates back to the back of his jaw, worse with eating.  Has had a hole in that tooth for some time, was supposed to have it pulled last week but was laid off and didn't have the money.  Denies fevers, sore throat, difficulty swallowing or breathing.    The history is provided by the patient.    History reviewed. No pertinent past medical history.  History reviewed. No pertinent past surgical history.  No family history on file.  History  Substance Use Topics  . Smoking status: Current Everyday Smoker  . Smokeless tobacco: Not on file  . Alcohol Use: Yes      Review of Systems  Constitutional: Negative for fever and chills.  HENT: Positive for dental problem. Negative for sore throat, facial swelling and trouble swallowing.   Respiratory: Negative for shortness of breath, wheezing and stridor.     Allergies  Vicodin; Codeine; and Tramadol  Home Medications  No current outpatient prescriptions on file.  BP 131/73  Pulse 71  Temp 98.1 F (36.7 C) (Oral)  Resp 16  Ht 6\' 1"  (1.854 m)  Wt 165 lb (74.844 kg)  BMI 21.77 kg/m2  SpO2 100%  Physical Exam  Nursing note and vitals reviewed. Constitutional: He appears well-developed and well-nourished. No distress.  HENT:  Head: Normocephalic and atraumatic.  Mouth/Throat: Uvula is midline and oropharynx is clear and moist. Mucous membranes are not dry. Abnormal dentition. No uvula swelling. No oropharyngeal exudate, posterior oropharyngeal edema, posterior oropharyngeal erythema or tonsillar abscesses.    Neck: Neck supple. No tracheal deviation present.  Pulmonary/Chest: Effort normal.  No stridor.  Lymphadenopathy:    He has cervical adenopathy.  Neurological: He is alert.  Skin: He is not diaphoretic.    ED Course  Procedures (including critical care time)  Labs Reviewed - No data to display No results found.   1. Pain, dental       MDM  Pt with left lower molar pain.  No voice change, pain or difficulty swallowing, no fevers.  No airway concerns.  No obvious abscess.  No clinical concern for Ludwig's angina or other deeper infection.  Pt d/c home with percocet (see pt's multiple allergies), penicillin, dental follow up.  Pt checked on DEA database showing no prescriptions in the past 6 months.  Discussed diagnosis and follow up with patient.  Pt given return precautions.  Pt verbalizes understanding and agrees with plan.           [place], [place] 04/19/12 [phone]

## 2012-04-20 NOTE — ED Provider Notes (Signed)
Medical screening examination/treatment/procedure(s) were performed by non-physician practitioner and as supervising physician I was immediately available for consultation/collaboration.   Benny Lennert, MD 04/20/12 [phone]

## 2012-05-14 ENCOUNTER — Encounter (HOSPITAL_COMMUNITY): Payer: Self-pay | Admitting: *Deleted

## 2012-05-14 ENCOUNTER — Emergency Department (HOSPITAL_COMMUNITY)
Admission: EM | Admit: 2012-05-14 | Discharge: 2012-05-14 | Disposition: A | Discharge status: Home / Self Care | Payer: Self-pay | Attending: Emergency Medicine | Admitting: Emergency Medicine

## 2012-05-14 DIAGNOSIS — M549 Dorsalgia, unspecified: Secondary | ICD-10-CM | POA: Insufficient documentation

## 2012-05-14 DIAGNOSIS — J029 Acute pharyngitis, unspecified: Secondary | ICD-10-CM | POA: Insufficient documentation

## 2012-05-14 DIAGNOSIS — F17200 Nicotine dependence, unspecified, uncomplicated: Secondary | ICD-10-CM | POA: Insufficient documentation

## 2012-05-14 DIAGNOSIS — Z885 Allergy status to narcotic agent status: Secondary | ICD-10-CM | POA: Insufficient documentation

## 2012-05-14 LAB — POCT I-STAT, CHEM 8
Glucose, Bld: 112 mg/dL — ABNORMAL HIGH (ref 70–99)
HCT: 48 % (ref 39.0–52.0)
Hemoglobin: 16.3 g/dL (ref 13.0–17.0)
Potassium: 4 mEq/L (ref 3.5–5.1)
Sodium: 141 mEq/L (ref 135–145)
TCO2: 26 mmol/L (ref 0–100)

## 2012-05-14 MED ORDER — OXYCODONE HCL 5 MG/5ML PO SOLN: 5.0000 mg | ORAL | Status: DC | PRN

## 2012-05-14 MED ORDER — IBUPROFEN 800 MG PO TABS: 800.0000 mg | ORAL_TABLET | Freq: Three times a day (TID) | ORAL | Status: DC | PRN

## 2012-05-14 MED ORDER — KETOROLAC TROMETHAMINE 30 MG/ML IJ SOLN
60.0000 mg | Freq: Once | INTRAMUSCULAR | Status: AC
  Administered 2012-05-14: 60 mg via INTRAMUSCULAR
  Filled 2012-05-14 (×2): qty 1

## 2012-05-14 MED ORDER — OXYCODONE-ACETAMINOPHEN 5-325 MG PO TABS
1.0000 | ORAL_TABLET | Freq: Once | ORAL | Status: AC
  Administered 2012-05-14: 1 via ORAL
  Filled 2012-05-14: qty 1

## 2012-05-14 NOTE — ED Provider Notes (Signed)
Medical screening examination/treatment/procedure(s) were performed by non-physician practitioner and as supervising physician I was immediately available for consultation/collaboration.  Marwan T Powers, MD 05/14/12 2339 

## 2012-05-14 NOTE — ED Notes (Signed)
Pt reports waking up this am with headache, sore throat, lower back pain, and sweating. Swelling noted to throat.

## 2012-05-14 NOTE — ED Provider Notes (Signed)
History     CSN: [id number]  Arrival date & time 05/14/12  1318   First MD Initiated Contact with Patient 05/14/12 1900      Chief Complaint  Patient presents with  . Sore Throat  . Back Pain  . Headache    (Consider location/radiation/quality/duration/timing/severity/associated sxs/prior treatment) HPI Comments: Patient reports he woke up this morning with stabbing bilateral lower back pain that is constant, nonradiating, also with bilateral temporal headache and sore throat.  Pain with swallowing but no difficulty swallowing.  Pt has been sweating all day, feeling hot, but denies any chills or known fevers.  Denies neck pain or stiffness, myalgias, CP, SOB, coughing, abdominal pain, N/V/D, change in urinary or bowel habits.  No known sick contacts.    Patient is a 26 y.o. male presenting with pharyngitis, back pain, and headaches. The history is provided by the patient.  Sore Throat Associated symptoms include diaphoresis, headaches and a sore throat. Pertinent negatives include no abdominal pain, chest pain, chills, coughing, fever, nausea or vomiting.  Back Pain  Associated symptoms include headaches. Pertinent negatives include no chest pain, no fever, no abdominal pain and no dysuria.  Headache  Pertinent negatives include no fever, no shortness of breath, no nausea and no vomiting.    History reviewed. No pertinent past medical history.  History reviewed. No pertinent past surgical history.  History reviewed. No pertinent family history.  History  Substance Use Topics  . Smoking status: Current Some Day Smoker  . Smokeless tobacco: Not on file  . Alcohol Use: Yes      Review of Systems  Constitutional: Positive for diaphoresis. Negative for fever and chills.  HENT: Positive for sore throat. Negative for trouble swallowing.   Respiratory: Negative for cough and shortness of breath.   Cardiovascular: Negative for chest pain.  Gastrointestinal: Negative for nausea,  vomiting, abdominal pain, diarrhea, constipation and blood in stool.  Genitourinary: Negative for dysuria, urgency, frequency and hematuria.  Musculoskeletal: Positive for back pain.  Neurological: Positive for headaches.    Allergies  Vicodin; Codeine; and Tramadol  Home Medications  No current outpatient prescriptions on file.  BP 110/82  Pulse 100  Temp 98.7 F (37.1 C) (Oral)  Resp 18  SpO2 99%  Physical Exam  Nursing note and vitals reviewed. Constitutional: He appears well-developed and well-nourished. No distress.  HENT:  Head: Normocephalic and atraumatic.  Mouth/Throat: Uvula is midline. Mucous membranes are not dry. No uvula swelling. Oropharyngeal exudate, posterior oropharyngeal edema and posterior oropharyngeal erythema present. No tonsillar abscesses.  Eyes: Conjunctivae normal are normal.  Neck: Normal range of motion and phonation normal. Neck supple. No tracheal tenderness present.  Cardiovascular: Normal rate and regular rhythm.   Pulmonary/Chest: Effort normal and breath sounds normal. No stridor. No respiratory distress. He has no wheezes. He has no rales. He exhibits no tenderness.  Abdominal: Soft. He exhibits no distension and no mass. There is no tenderness. There is no rebound and no guarding.  Musculoskeletal:       Cervical back: He exhibits no tenderness and no bony tenderness.       Thoracic back: He exhibits no tenderness and no bony tenderness.       Lumbar back: He exhibits no tenderness and no bony tenderness.  Lymphadenopathy:    He has cervical adenopathy.  Neurological: He is alert. He exhibits normal muscle tone.  Skin: He is not diaphoretic.    ED Course  Procedures (including critical care time)  Labs Reviewed  POCT I-STAT, CHEM 8 - Abnormal; Notable for the following:    Glucose, Bld 112 (*)     All other components within normal limits  RAPID STREP SCREEN  STREP A DNA PROBE   No results found.   1. Pharyngitis   2. Back  pain       MDM  Pt with one day of sore throat, headache, back pain.  Strep screen is negative.  Pharynx is erythematous, tonsils enlarged, exudate present.  Mild cervical adenopathy, absence of cough.  Pt is afebrile.   Have sent strep dna probe.  Pt worried about his kidneys given his back pain, i-stat chem 8 sent, which is negative except for mildly elevated glucose.  Pt d/c home with symptomatic treatment.  Pt aware dna probe is pending.  Discussed all results with patient.  Pt given return precautions.  Pt verbalizes understanding and agrees with plan.           [place], [place] 05/14/12 2311

## 2012-05-15 LAB — STREP A DNA PROBE: Group A Strep Probe: NEGATIVE

## 2012-06-10 ENCOUNTER — Encounter (HOSPITAL_COMMUNITY): Payer: Self-pay | Admitting: *Deleted

## 2012-06-10 ENCOUNTER — Emergency Department (HOSPITAL_COMMUNITY)
Admission: EM | Admit: 2012-06-10 | Discharge: 2012-06-10 | Disposition: A | Discharge status: Home / Self Care | Payer: Self-pay | Attending: Emergency Medicine | Admitting: Emergency Medicine

## 2012-06-10 DIAGNOSIS — K029 Dental caries, unspecified: Secondary | ICD-10-CM | POA: Insufficient documentation

## 2012-06-10 DIAGNOSIS — K0889 Other specified disorders of teeth and supporting structures: Secondary | ICD-10-CM

## 2012-06-10 MED ORDER — IBUPROFEN 800 MG PO TABS: 800.0000 mg | ORAL_TABLET | Freq: Three times a day (TID) | ORAL | Status: DC

## 2012-06-10 MED ORDER — PENICILLIN V POTASSIUM 500 MG PO TABS: 500.0000 mg | ORAL_TABLET | Freq: Three times a day (TID) | ORAL | Status: DC

## 2012-06-10 NOTE — ED Provider Notes (Signed)
History     CSN: [id number]  Arrival date & time 06/10/12  1126   First MD Initiated Contact with Patient 06/10/12 1131      Chief Complaint  Patient presents with  . Dental Pain    (Consider location/radiation/quality/duration/timing/severity/associated sxs/prior treatment) HPI  26 year old male presents complaining of dental pain. Patient reports a gradual onset of pain to his right lower premolar for the past 2 days. Describe pain as a sharp and throbbing sensation worsening with eating, with cold air, with cold water. Pain has been persistent, nonradiating, and is keeping him up at night. He denies fever, chills, throat swelling, neck stiffness, ear pain, chest pain, short of breath. He has not tried anything to alleviate his symptoms.  History reviewed. No pertinent past medical history.  History reviewed. No pertinent past surgical history.  No family history on file.  History  Substance Use Topics  . Smoking status: Current Some Day Smoker  . Smokeless tobacco: Not on file  . Alcohol Use: Yes      Review of Systems  Constitutional: Negative for fever.  HENT: Positive for dental problem. Negative for sore throat, trouble swallowing and voice change.   Skin: Negative for rash.  Neurological: Negative for numbness.    Allergies  Vicodin; Codeine; and Tramadol  Home Medications   Current Outpatient Rx  Name Route Sig Dispense Refill  . IBUPROFEN 800 MG PO TABS Oral Take 1 tablet (800 mg total) by mouth every 8 (eight) hours as needed for pain. 21 tablet 0  . OXYCODONE HCL 5 MG/5ML PO SOLN Oral Take 5 mLs (5 mg total) by mouth every 4 (four) hours as needed for pain. 80 mL 0    BP 128/67  Pulse 69  Temp 97.9 F (36.6 C) (Oral)  Resp 18  SpO2 99%  Physical Exam  Nursing note and vitals reviewed. Constitutional: He appears well-developed and well-nourished. No distress.  HENT:  Head: Normocephalic and atraumatic.  Right Ear: External ear normal.  Left  Ear: External ear normal.  Nose: Nose normal.  Mouth/Throat: Oropharynx is clear and moist. No oropharyngeal exudate.    Eyes: Conjunctivae normal are normal.  Neck: Normal range of motion. Neck supple.  Pulmonary/Chest: Effort normal. No respiratory distress.  Lymphadenopathy:    He has no cervical adenopathy.  Neurological: He is alert.  Skin: Skin is warm. No rash noted.  Psychiatric: He has a normal mood and affect.    ED Course  Procedures (including critical care time)  Labs Reviewed - No data to display No results found.   No diagnosis found.  1. Dental pain  MDM  Pt with left lower molar pain. No voice change, pain or difficulty swallowing, no fevers. No airway concerns. No obvious abscess. No clinical concern for Ludwig's angina or other deeper infection.  Patient has been seen in the ED multiple times in the past for the same dental pain. I will prescribe antibiotic and will refer to dentist for further care. Discussed diagnosis and follow up with patient. Pt given return precautions. Pt verbalizes understanding and agrees with plan.    he has an extensive list of allergies to narcotic pain medication, therefore I will recommend ibuprofen for pain. A check on [place] drug database revealed his last narcotic prescription was given on 05/14/12.    BP 128/67  Pulse 69  Temp 97.9 F (36.6 C) (Oral)  Resp 18  SpO2 99%  Nursing notes reviewed and considered in documentation  Previous records reviewed  and considered            Fayrene Helper, PA-C 06/10/12 1146

## 2012-06-10 NOTE — ED Provider Notes (Signed)
Medical screening examination/treatment/procedure(s) were performed by non-physician practitioner and as supervising physician I was immediately available for consultation/collaboration.   Zara Wendt B. Bernette Mayers, MD 06/10/12 [phone]

## 2012-06-10 NOTE — ED Notes (Signed)
Pt is here with left lower dental pain

## 2012-06-10 NOTE — ED Notes (Signed)
Pt presents to department for evaluation of L lower molar pain. Ongoing x2 days. States tooth filling fell out. 8/10 pain at the time. No facial swelling. Denies SOB. He is conscious alert and oriented x4. No signs of distress noted.

## 2012-06-12 ENCOUNTER — Emergency Department (HOSPITAL_COMMUNITY): Payer: Self-pay

## 2012-06-12 ENCOUNTER — Encounter (HOSPITAL_COMMUNITY): Payer: Self-pay | Admitting: Emergency Medicine

## 2012-06-12 ENCOUNTER — Emergency Department (HOSPITAL_COMMUNITY)
Admission: EM | Admit: 2012-06-12 | Discharge: 2012-06-12 | Disposition: A | Discharge status: Home / Self Care | Payer: Self-pay | Attending: Emergency Medicine | Admitting: Emergency Medicine

## 2012-06-12 DIAGNOSIS — M542 Cervicalgia: Secondary | ICD-10-CM | POA: Insufficient documentation

## 2012-06-12 DIAGNOSIS — M545 Low back pain, unspecified: Secondary | ICD-10-CM | POA: Insufficient documentation

## 2012-06-12 DIAGNOSIS — W19XXXA Unspecified fall, initial encounter: Secondary | ICD-10-CM

## 2012-06-12 DIAGNOSIS — S335XXA Sprain of ligaments of lumbar spine, initial encounter: Secondary | ICD-10-CM | POA: Insufficient documentation

## 2012-06-12 DIAGNOSIS — S161XXA Strain of muscle, fascia and tendon at neck level, initial encounter: Secondary | ICD-10-CM

## 2012-06-12 DIAGNOSIS — F17200 Nicotine dependence, unspecified, uncomplicated: Secondary | ICD-10-CM | POA: Insufficient documentation

## 2012-06-12 DIAGNOSIS — W11XXXA Fall on and from ladder, initial encounter: Secondary | ICD-10-CM | POA: Insufficient documentation

## 2012-06-12 DIAGNOSIS — S39012A Strain of muscle, fascia and tendon of lower back, initial encounter: Secondary | ICD-10-CM

## 2012-06-12 DIAGNOSIS — S139XXA Sprain of joints and ligaments of unspecified parts of neck, initial encounter: Secondary | ICD-10-CM | POA: Insufficient documentation

## 2012-06-12 MED ORDER — IBUPROFEN 800 MG PO TABS
800.0000 mg | ORAL_TABLET | Freq: Once | ORAL | Status: AC
  Administered 2012-06-12: 800 mg via ORAL
  Filled 2012-06-12: qty 1

## 2012-06-12 MED ORDER — IBUPROFEN 800 MG PO TABS: 800.0000 mg | ORAL_TABLET | Freq: Three times a day (TID) | ORAL | Status: DC | PRN

## 2012-06-12 MED ORDER — CYCLOBENZAPRINE HCL 10 MG PO TABS: 10.0000 mg | ORAL_TABLET | Freq: Three times a day (TID) | ORAL | Status: DC | PRN

## 2012-06-12 NOTE — ED Notes (Signed)
Per patient, states he was cleaning gutters and fell off ladder-fell about 7-8 feet-fell on right side and rolled to back-no LOC-did not hit head

## 2012-06-12 NOTE — ED Provider Notes (Signed)
History     CSN: [id number]  Arrival date & time 06/12/12  1007   First MD Initiated Contact with Patient 06/12/12 1037      Chief Complaint  Patient presents with  . Fall    (Consider location/radiation/quality/duration/timing/severity/associated sxs/prior treatment) HPI Pt reports he fell approx 8 feet off a ladder earlier today landing on his R side and rolling onto his back. Complaining of neck and lower back pain, moderate to severe, aching and worse with palpation. Denies head injury or LOC.   History reviewed. No pertinent past medical history.  History reviewed. No pertinent past surgical history.  No family history on file.  History  Substance Use Topics  . Smoking status: Current Some Day Smoker  . Smokeless tobacco: Not on file  . Alcohol Use: Yes      Review of Systems All other systems reviewed and are negative except as noted in HPI.   Allergies  Vicodin; Codeine; and Tramadol  Home Medications   Current Outpatient Rx  Name Route Sig Dispense Refill  . IBUPROFEN 800 MG PO TABS Oral Take 1 tablet (800 mg total) by mouth 3 (three) times daily. 21 tablet 0  . PENICILLIN V POTASSIUM 500 MG PO TABS Oral Take 1 tablet (500 mg total) by mouth 3 (three) times daily. 30 tablet 0    BP 118/78  Pulse 79  Temp 98.2 F (36.8 C) (Oral)  Resp 18  SpO2 100%  Physical Exam  Nursing note and vitals reviewed. Constitutional: He is oriented to person, place, and time. He appears well-developed and well-nourished.  HENT:  Head: Normocephalic and atraumatic.  Eyes: EOM are normal. Pupils are equal, round, and reactive to light.  Neck: Normal range of motion. Neck supple.  Cardiovascular: Normal rate, normal heart sounds and intact distal pulses.   Pulmonary/Chest: Effort normal and breath sounds normal.  Abdominal: Bowel sounds are normal. He exhibits no distension. There is no tenderness.  Musculoskeletal: Normal range of motion. He exhibits tenderness. He  exhibits no edema.       Cervical back: He exhibits bony tenderness.       Thoracic back: He exhibits no bony tenderness.       Lumbar back: He exhibits bony tenderness.  Neurological: He is alert and oriented to person, place, and time. He has normal strength. No cranial nerve deficit or sensory deficit.  Skin: Skin is warm and dry. No rash noted.  Psychiatric: He has a normal mood and affect.    ED Course  Procedures (including critical care time)  Labs Reviewed - No data to display Dg Lumbar Spine Complete  06/12/2012  *RADIOLOGY REPORT*  Clinical Data: Post fall, now with low back pain and stiffness  LUMBAR SPINE - COMPLETE 4+ VIEW  Comparison: Lumbar spine radiograph - 12/14/2011  Findings:  There are five non-rib bearing lumbar type vertebral bodies.  Mild scoliotic curvature of the thoracolumbar spine, possibly positional.  No anterolisthesis or retrolisthesis.  No definite pars defects.  There is unchanged mild concavity of the superior endplates of the L1, L2 and L3 vertebral bodies.  Remaining vertebral body heights appear preserved.  There is unchanged mild multilevel DDD, worse at L1 - L2 and L2 - L3.  Limited visualization of the bilateral SI joints is normal. Regional bowel gas pattern and soft tissues are normal.  IMPRESSION: Grossly unchanged mild multilevel DDD, worse at L1 - L2 and L2 - L3   Original Report Authenticated By: Judene Companion.D.  No diagnosis found.    MDM  Pt placed in C-collar, xrays ordered. He has numerous ED visits for pain complaints including 2 days ago at [facility] for toothache.   1:26 PM Lumbar xray neg as above. C spine report has not crossed over, but also negative for injury. Collar removed. Pt advised NSAID and flexeril.    Charles B. Bernette Mayers, MD 06/12/12 1327

## 2012-06-12 NOTE — ED Notes (Signed)
Patient transported to X-ray 

## 2012-08-23 ENCOUNTER — Emergency Department (HOSPITAL_COMMUNITY)
Admission: EM | Admit: 2012-08-23 | Discharge: 2012-08-23 | Disposition: A | Discharge status: Home / Self Care | Payer: Self-pay | Attending: Emergency Medicine | Admitting: Emergency Medicine

## 2012-08-23 ENCOUNTER — Encounter (HOSPITAL_COMMUNITY): Payer: Self-pay | Admitting: Emergency Medicine

## 2012-08-23 DIAGNOSIS — K089 Disorder of teeth and supporting structures, unspecified: Secondary | ICD-10-CM | POA: Insufficient documentation

## 2012-08-23 DIAGNOSIS — K0889 Other specified disorders of teeth and supporting structures: Secondary | ICD-10-CM

## 2012-08-23 DIAGNOSIS — F17200 Nicotine dependence, unspecified, uncomplicated: Secondary | ICD-10-CM | POA: Insufficient documentation

## 2012-08-23 MED ORDER — IBUPROFEN 600 MG PO TABS: 600.0000 mg | ORAL_TABLET | Freq: Four times a day (QID) | ORAL | Status: DC | PRN

## 2012-08-23 MED ORDER — AMOXICILLIN 500 MG PO CAPS: 500.0000 mg | ORAL_CAPSULE | Freq: Three times a day (TID) | ORAL | Status: DC

## 2012-08-23 MED ORDER — AMOXICILLIN 500 MG PO CAPS
500.0000 mg | ORAL_CAPSULE | Freq: Once | ORAL | Status: AC
  Administered 2012-08-23: 500 mg via ORAL
  Filled 2012-08-23: qty 1

## 2012-08-23 MED ORDER — OXYCODONE-ACETAMINOPHEN 5-325 MG PO TABS
1.0000 | ORAL_TABLET | Freq: Once | ORAL | Status: AC
  Administered 2012-08-23: 1 via ORAL
  Filled 2012-08-23: qty 1

## 2012-08-23 MED ORDER — KETOROLAC TROMETHAMINE 60 MG/2ML IM SOLN
60.0000 mg | Freq: Once | INTRAMUSCULAR | Status: DC
  Filled 2012-08-23: qty 2

## 2012-08-23 NOTE — ED Provider Notes (Signed)
History   This chart was scribed for Jaynie Crumble, PA, by Leone Payor, ED Scribe. This patient was seen in room WTR7/WTR7 and the patient's care was started at 1832.   CSN: [id number]  Arrival date & time 08/23/12  1653   First MD Initiated Contact with Patient 08/23/12 1832      Chief Complaint  Patient presents with  . Dental Pain     The history is provided by the patient. No language interpreter was used.   Jon Castro is a 26 y.o. male who presents to the Emergency Department complaining of recurrent dental pain to the lower, left 2nd molar starting late last night. Pt reports that he has had his tooth fixed in the past and now states the pain has returned after a part of the tooth fell out. He states that he called his dentist and was told he wouldn't be seen for 3 weeks. Pt reports taking ibuprofen with no relief. He denies fever, headache, nausea, vomiting.   Pt is a current everyday smoker and occasional alcohol user.  History reviewed. No pertinent past medical history.  History reviewed. No pertinent past surgical history.  History reviewed. No pertinent family history.  History  Substance Use Topics  . Smoking status: Current Some Day Smoker  . Smokeless tobacco: Not on file  . Alcohol Use: Yes      Review of Systems  Constitutional: Negative.  Negative for fever.  HENT: Positive for dental problem.   Respiratory: Negative.   Cardiovascular: Negative.   Gastrointestinal: Negative.  Negative for nausea and vomiting.  Musculoskeletal: Negative.   Skin: Negative.   Neurological: Negative.  Negative for headaches.  Hematological: Negative.   Psychiatric/Behavioral: Negative.     Allergies  Vicodin; Codeine; and Tramadol  Home Medications   Current Outpatient Rx  Name  Route  Sig  Dispense  Refill  . CYCLOBENZAPRINE HCL 10 MG PO TABS   Oral   Take 1 tablet (10 mg total) by mouth 3 (three) times daily as needed for muscle spasms.   30 tablet   0   . IBUPROFEN 800 MG PO TABS   Oral   Take 1 tablet (800 mg total) by mouth 3 (three) times daily.   21 tablet   0   . IBUPROFEN 800 MG PO TABS   Oral   Take 1 tablet (800 mg total) by mouth every 8 (eight) hours as needed for pain.   30 tablet   0   . PENICILLIN V POTASSIUM 500 MG PO TABS   Oral   Take 1 tablet (500 mg total) by mouth 3 (three) times daily.   30 tablet   0     BP 114/65  Pulse 80  Temp 98.7 F (37.1 C) (Oral)  Resp 20  SpO2 100%  Physical Exam  Nursing note and vitals reviewed. Constitutional: He is oriented to person, place, and time. He appears well-developed and well-nourished. No distress.  HENT:  Head: Normocephalic and atraumatic.  Right Ear: External ear normal.  Left Ear: External ear normal.       Poor dentition, multiple caries. Tenderness to lower, left 2nd molar. No signs of an abscess.   Eyes: EOM are normal. Pupils are equal, round, and reactive to light.  Neck: Neck supple. No tracheal deviation present.  Cardiovascular: Normal rate, regular rhythm and normal heart sounds.   Pulmonary/Chest: Effort normal and breath sounds normal. No respiratory distress.  Musculoskeletal: Normal range of motion.  Neurological: He  is alert and oriented to person, place, and time.  Skin: Skin is warm and dry.  Psychiatric: He has a normal mood and affect. His behavior is normal.    ED Course  Procedures (including critical care time) DIAGNOSTIC STUDIES: Oxygen Saturation is 100% on room air, normal by my interpretation.    COORDINATION OF CARE:   7:16 PM Discussed treatment plan which includes antibiotic and OTC pain medication with pt at bedside and pt agreed to plan.   Labs Reviewed - No data to display No results found.   1. Pain, dental       MDM  PT with dental pain. Multiple visits for the same. Poor dentition. No signs of an abscess. Afebrile, non toxic. Will treat with antibiotics, pain medications here, over the counter meds  at home. Follow up.      I personally performed the services described in this documentation, which was scribed in my presence. The recorded information has been reviewed and is accurate.    Lottie Mussel, PA 08/23/12 1921

## 2012-08-23 NOTE — ED Notes (Signed)
Pt states he has a ride home. Pt refused IM Toradol.

## 2012-08-23 NOTE — ED Notes (Signed)
Patient reports that he has had this tooth fixed in the past. The patient reports that now the pain is back and he is unable to see the dentist for 3 weeks

## 2012-08-23 NOTE — ED Notes (Signed)
Pt not in [facility] when called

## 2012-08-24 NOTE — ED Provider Notes (Signed)
Medical screening examination/treatment/procedure(s) were performed by non-physician practitioner and as supervising physician I was immediately available for consultation/collaboration.   Dannetta Lekas M  Space, MD 08/24/12 1532 

## 2012-12-05 ENCOUNTER — Emergency Department (HOSPITAL_COMMUNITY)
Admission: EM | Admit: 2012-12-05 | Discharge: 2012-12-05 | Disposition: A | Discharge status: Home / Self Care | Payer: Self-pay | Attending: Emergency Medicine | Admitting: Emergency Medicine

## 2012-12-05 ENCOUNTER — Emergency Department (HOSPITAL_COMMUNITY): Payer: Self-pay

## 2012-12-05 ENCOUNTER — Encounter (HOSPITAL_COMMUNITY): Payer: Self-pay | Admitting: Emergency Medicine

## 2012-12-05 DIAGNOSIS — M25512 Pain in left shoulder: Secondary | ICD-10-CM

## 2012-12-05 DIAGNOSIS — S46012A Strain of muscle(s) and tendon(s) of the rotator cuff of left shoulder, initial encounter: Secondary | ICD-10-CM

## 2012-12-05 DIAGNOSIS — X58XXXA Exposure to other specified factors, initial encounter: Secondary | ICD-10-CM | POA: Insufficient documentation

## 2012-12-05 DIAGNOSIS — Y929 Unspecified place or not applicable: Secondary | ICD-10-CM | POA: Insufficient documentation

## 2012-12-05 DIAGNOSIS — Y9383 Activity, rough housing and horseplay: Secondary | ICD-10-CM | POA: Insufficient documentation

## 2012-12-05 DIAGNOSIS — S43429A Sprain of unspecified rotator cuff capsule, initial encounter: Secondary | ICD-10-CM | POA: Insufficient documentation

## 2012-12-05 DIAGNOSIS — F17200 Nicotine dependence, unspecified, uncomplicated: Secondary | ICD-10-CM | POA: Insufficient documentation

## 2012-12-05 MED ORDER — IBUPROFEN 800 MG PO TABS
800.0000 mg | ORAL_TABLET | Freq: Once | ORAL | Status: AC
  Administered 2012-12-05: 800 mg via ORAL
  Filled 2012-12-05: qty 1

## 2012-12-05 MED ORDER — OXYCODONE-ACETAMINOPHEN 5-325 MG PO TABS
1.0000 | ORAL_TABLET | Freq: Once | ORAL | Status: AC
  Administered 2012-12-05: 1 via ORAL
  Filled 2012-12-05: qty 1

## 2012-12-05 MED ORDER — OXYCODONE-ACETAMINOPHEN 5-325 MG PO TABS: 1.0000 | ORAL_TABLET | ORAL | Status: DC | PRN

## 2012-12-05 NOTE — ED Notes (Signed)
Pt discharged.Vital signs stable and GCS 15 

## 2012-12-05 NOTE — ED Notes (Signed)
PT. REPORTS LEFT SHOULDER INJURY THIS MORNING WHILE PLAYING AROUND / WRESTLING WITH GIRLFRIEND.

## 2012-12-06 NOTE — ED Provider Notes (Signed)
History     CSN: [id number]  Arrival date & time 12/05/12  2440   First MD Initiated Contact with Patient 12/05/12 0534      Chief Complaint  Patient presents with  . Shoulder Injury    (Consider location/radiation/quality/duration/timing/severity/associated sxs/prior treatment) HPI Bailen Geffre is a 27 y.o. male with shoudler pain sustained after wrestling with his girlfriend.  Pain is 8-9/10, sharp in the left shoulder, non-radiating, hurts to move the shoulder in flexion, aBduction especially at extremes of motion, denies fever, chills, chest pain, shortness of breath, abdominal pain, nausea, vomiting, or diarrhea, denies other injuries, denies hitting his head or LOC.  No past medical history on file.  History reviewed. No pertinent past surgical history.  No family history on file.  History  Substance Use Topics  . Smoking status: Current Some Day Smoker  . Smokeless tobacco: Not on file  . Alcohol Use: Yes      Review of Systems At least 10pt or greater review of systems completed and are negative except where specified in the HPI.  Allergies  Vicodin; Codeine; and Tramadol  Home Medications   Current Outpatient Rx  Name  Route  Sig  Dispense  Refill  . oxyCODONE-acetaminophen (PERCOCET/ROXICET) 5-325 MG per tablet   Oral   Take 1-2 tablets by mouth every 4 (four) hours as needed for pain.   13 tablet   0     BP 130/73  Pulse 72  Temp(Src) 98.7 F (37.1 C) (Oral)  Resp 18  SpO2 99%  Physical Exam  Nursing notes reviewed.  Electronic medical record reviewed. VITAL SIGNS:   Filed Vitals:   12/05/12 0337 12/05/12 0705  BP: 98/78 130/73  Pulse: 64 72  Temp: 97.4 F (36.3 C) 98.7 F (37.1 C)  TempSrc: Oral Oral  Resp: 17 18  SpO2: 100% 99%   CONSTITUTIONAL: Awake, oriented, appears non-toxic HENT: Atraumatic, normocephalic, oral mucosa pink and moist, airway patent. Nares patent without drainage. External ears normal. EYES: Conjunctiva  clear, EOMI, PERRLA NECK: Trachea midline, non-tender, supple CARDIOVASCULAR: Normal heart rate, Normal rhythm, No murmurs, rubs, gallops PULMONARY/CHEST: Clear to auscultation, no rhonchi, wheezes, or rales. Symmetrical breath sounds. Non-tender. ABDOMINAL: Non-distended, soft, non-tender - no rebound or guarding.  BS normal. NEUROLOGIC: Non-focal, moving all four extremities, no gross sensory or motor deficits. EXTREMITIES: No clubbing, cyanosis, or edema. Tender to palpation over anterior humeral head over the supraspinatus and infraspinatus.  Pain is worse over the first 20 degrees of abduction and forward flexion.   SKIN: Warm, Dry, No erythema, No rash   ED Course  Procedures (including critical care time)  Labs Reviewed - No data to display Dg Shoulder Left  12/05/2012  *RADIOLOGY REPORT*  Clinical Data: Sudden onset of left shoulder pain.  LEFT SHOULDER - 2+ VIEW  Comparison: None.  Findings: There is no evidence of fracture or dislocation.  The left humeral head is seated within the glenoid fossa.  The acromioclavicular joint is unremarkable in appearance.  No significant soft tissue abnormalities are seen.  The visualized portions of the left lung are clear.  IMPRESSION: No evidence of fracture or dislocation.   Original Report Authenticated By: Tonia Ghent, M.D.      1. Shoulder pain, acute, left   2. Rotator cuff strain, left, initial encounter       MDM  Nolton Denis is a 27 y.o. male presenting with rotator cuff strain.  XR of shoulder are negative, no AC separation.  Sling for comfort,  early mobilization, RICE therapy.  No swelling or discoloration, doubt ruptured tendon, torn muscle, no MSK emergency, doubt nec fasc.  DC to home in good condition.        Jones Skene, MD 12/06/12 2313

## 2012-12-26 ENCOUNTER — Emergency Department (HOSPITAL_COMMUNITY)
Admission: EM | Admit: 2012-12-26 | Discharge: 2012-12-26 | Disposition: A | Discharge status: Home / Self Care | Payer: Self-pay | Attending: Emergency Medicine | Admitting: Emergency Medicine

## 2012-12-26 ENCOUNTER — Encounter (HOSPITAL_COMMUNITY): Payer: Self-pay | Admitting: Emergency Medicine

## 2012-12-26 DIAGNOSIS — M549 Dorsalgia, unspecified: Secondary | ICD-10-CM

## 2012-12-26 DIAGNOSIS — M545 Low back pain, unspecified: Secondary | ICD-10-CM | POA: Insufficient documentation

## 2012-12-26 DIAGNOSIS — F17200 Nicotine dependence, unspecified, uncomplicated: Secondary | ICD-10-CM | POA: Insufficient documentation

## 2012-12-26 MED ORDER — DIAZEPAM 5 MG PO TABS
5.0000 mg | ORAL_TABLET | Freq: Once | ORAL | Status: AC
  Administered 2012-12-26: 5 mg via ORAL
  Filled 2012-12-26: qty 1

## 2012-12-26 MED ORDER — NAPROXEN 500 MG PO TABS: 500.0000 mg | ORAL_TABLET | Freq: Two times a day (BID) | ORAL | Status: DC

## 2012-12-26 MED ORDER — DIAZEPAM 5 MG PO TABS: 5.0000 mg | ORAL_TABLET | Freq: Three times a day (TID) | ORAL | Status: DC | PRN

## 2012-12-26 NOTE — ED Provider Notes (Signed)
History     CSN: [id number]  Arrival date & time 12/26/12  4098   First MD Initiated Contact with Patient 12/26/12 [phone]      Chief Complaint  Patient presents with  . Back Pain    (Consider location/radiation/quality/duration/timing/severity/associated sxs/prior treatment) HPI 27 yo male presents to the ER with complaint of mid to low back pain ongoing for the last 2 weeks.  Pt reports pain has worsened over the last 2-3 days.  He has had shooting pains into his legs, right greater than left.  Pain goes all the way down to heel.  No incontinence (bowel or bladder), no erection problems, no h/o ivdu, no weight loss, no night sweats.  Pain is worse with flexion or extension.  No weakness or numbness of legs.  Pt reports h/o same about 4 years ago when he was doing a lot of lifting with [employer].  Pt started new job 4 weeks ago, has been lifting a lot at work.  He has been taking motrin 600 mg q4-6 hours with minimal improvement.  No pcm.  History reviewed. No pertinent past medical history.  History reviewed. No pertinent past surgical history.  No family history on file.  History  Substance Use Topics  . Smoking status: Current Some Day Smoker  . Smokeless tobacco: Not on file  . Alcohol Use: Yes      Review of Systems  All other systems reviewed and are negative.    Allergies  Vicodin; Codeine; and Tramadol  Home Medications   Current Outpatient Rx  Name  Route  Sig  Dispense  Refill  . oxyCODONE-acetaminophen (PERCOCET/ROXICET) 5-325 MG per tablet   Oral   Take 1-2 tablets by mouth every 4 (four) hours as needed for pain.   13 tablet   0     BP 119/73  Pulse 81  Temp(Src) 99 F (37.2 C) (Oral)  Resp 20  SpO2 100%  Physical Exam  Nursing note and vitals reviewed. Constitutional: He is oriented to person, place, and time. He appears well-developed and well-nourished.  HENT:  Head: Normocephalic and atraumatic.  Nose: Nose normal.  Mouth/Throat: Oropharynx  is clear and moist.  Eyes: Conjunctivae and EOM are normal. Pupils are equal, round, and reactive to light.  Neck: Normal range of motion. Neck supple. No JVD present. No tracheal deviation present. No thyromegaly present.  Cardiovascular: Normal rate, regular rhythm, normal heart sounds and intact distal pulses.  Exam reveals no gallop and no friction rub.   No murmur heard. Pulmonary/Chest: Effort normal and breath sounds normal. No stridor. No respiratory distress. He has no wheezes. He has no rales. He exhibits no tenderness.  Abdominal: Soft. Bowel sounds are normal. He exhibits no distension and no mass. There is no tenderness. There is no rebound and no guarding.  Musculoskeletal: Normal range of motion. He exhibits tenderness (diffuse mid back pain both on spinous process and paraspinal muscles.  No stepoff or crepitus, no overlying skin changes). He exhibits no edema.  Lymphadenopathy:    He has no cervical adenopathy.  Neurological: He is oriented to person, place, and time. He has normal reflexes. He exhibits normal muscle tone. Coordination normal.  Neg straight leg raise bilaterally.  Normal strength and dtrs.  Skin: Skin is warm and dry. No rash noted. No erythema. No pallor.  Psychiatric: He has a normal mood and affect. His behavior is normal. Judgment and thought content normal.    ED Course  Procedures (including critical care time)  Labs Reviewed - No data to display No results found.   1. Back pain       MDM  27 yo male with back pain.  No red flags on history or physical.  Will give valium for muscle spasm component, switch to naprosyn, and given back exercises handout as well as resource information.  Do not feel sxs are due to infection, acute disc herniation, or bony abn.        Olivia Mackie, MD 12/26/12 [phone]

## 2012-12-26 NOTE — ED Notes (Signed)
"  I hurt my back 2 weeks ago, I do a lot of lifting at work, i am having pain in the middle of my back that is going down my right and left leg"

## 2013-01-07 ENCOUNTER — Ambulatory Visit: Payer: Self-pay | Admitting: Family Medicine

## 2013-03-25 ENCOUNTER — Encounter (HOSPITAL_COMMUNITY): Payer: Self-pay | Admitting: *Deleted

## 2013-03-25 ENCOUNTER — Emergency Department (HOSPITAL_COMMUNITY): Payer: Self-pay

## 2013-03-25 ENCOUNTER — Emergency Department (HOSPITAL_COMMUNITY)
Admission: EM | Admit: 2013-03-25 | Discharge: 2013-03-25 | Disposition: A | Discharge status: Home / Self Care | Payer: Self-pay | Attending: Emergency Medicine | Admitting: Emergency Medicine

## 2013-03-25 DIAGNOSIS — S60229A Contusion of unspecified hand, initial encounter: Secondary | ICD-10-CM | POA: Insufficient documentation

## 2013-03-25 DIAGNOSIS — F17200 Nicotine dependence, unspecified, uncomplicated: Secondary | ICD-10-CM | POA: Insufficient documentation

## 2013-03-25 DIAGNOSIS — S60221A Contusion of right hand, initial encounter: Secondary | ICD-10-CM

## 2013-03-25 MED ORDER — OXYCODONE-ACETAMINOPHEN 5-325 MG PO TABS: 1.0000 | ORAL_TABLET | ORAL | Status: DC | PRN

## 2013-03-25 NOTE — ED Provider Notes (Signed)
Medical screening examination/treatment/procedure(s) were performed by non-physician practitioner and as supervising physician I was immediately available for consultation/collaboration.   Anastasija Anfinson, MD 03/25/13 0333 
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
## 2013-03-25 NOTE — ED Notes (Signed)
Pt in route to Xray

## 2013-03-29 ENCOUNTER — Emergency Department (HOSPITAL_COMMUNITY): Payer: No Typology Code available for payment source

## 2013-03-29 ENCOUNTER — Encounter (HOSPITAL_COMMUNITY): Payer: Self-pay | Admitting: Emergency Medicine

## 2013-03-29 ENCOUNTER — Emergency Department (HOSPITAL_COMMUNITY)
Admission: EM | Admit: 2013-03-29 | Discharge: 2013-03-29 | Disposition: A | Discharge status: Home / Self Care | Payer: No Typology Code available for payment source | Attending: Emergency Medicine | Admitting: Emergency Medicine

## 2013-03-29 DIAGNOSIS — Y92410 Unspecified street and highway as the place of occurrence of the external cause: Secondary | ICD-10-CM | POA: Insufficient documentation

## 2013-03-29 DIAGNOSIS — S8000XA Contusion of unspecified knee, initial encounter: Secondary | ICD-10-CM | POA: Insufficient documentation

## 2013-03-29 DIAGNOSIS — F17200 Nicotine dependence, unspecified, uncomplicated: Secondary | ICD-10-CM | POA: Insufficient documentation

## 2013-03-29 DIAGNOSIS — Y939 Activity, unspecified: Secondary | ICD-10-CM | POA: Insufficient documentation

## 2013-03-29 DIAGNOSIS — IMO0002 Reserved for concepts with insufficient information to code with codable children: Secondary | ICD-10-CM | POA: Insufficient documentation

## 2013-03-29 DIAGNOSIS — S199XXA Unspecified injury of neck, initial encounter: Secondary | ICD-10-CM | POA: Insufficient documentation

## 2013-03-29 DIAGNOSIS — S0993XA Unspecified injury of face, initial encounter: Secondary | ICD-10-CM | POA: Insufficient documentation

## 2013-03-29 DIAGNOSIS — S8002XA Contusion of left knee, initial encounter: Secondary | ICD-10-CM

## 2013-03-29 MED ORDER — NAPROXEN 250 MG PO TABS
500.0000 mg | ORAL_TABLET | Freq: Once | ORAL | Status: AC
  Administered 2013-03-29: 500 mg via ORAL
  Filled 2013-03-29: qty 2

## 2013-03-29 MED ORDER — NAPROXEN 500 MG PO TABS: 500.0000 mg | ORAL_TABLET | Freq: Two times a day (BID) | ORAL | Status: DC
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
## 2013-03-30 NOTE — ED Provider Notes (Signed)
Medical screening examination/treatment/procedure(s) were performed by non-physician practitioner and as supervising physician I was immediately available for consultation/collaboration.   Kysha Muralles J. Cherlynn Popiel, MD 03/30/13 1753 

## 2013-07-23 ENCOUNTER — Encounter (HOSPITAL_COMMUNITY): Payer: Self-pay | Admitting: Emergency Medicine

## 2013-07-23 ENCOUNTER — Emergency Department (HOSPITAL_COMMUNITY)
Admission: EM | Admit: 2013-07-23 | Discharge: 2013-07-23 | Disposition: A | Discharge status: Home / Self Care | Payer: Self-pay | Attending: Emergency Medicine | Admitting: Emergency Medicine

## 2013-07-23 DIAGNOSIS — K0381 Cracked tooth: Secondary | ICD-10-CM | POA: Insufficient documentation

## 2013-07-23 DIAGNOSIS — H9209 Otalgia, unspecified ear: Secondary | ICD-10-CM | POA: Insufficient documentation

## 2013-07-23 DIAGNOSIS — Z888 Allergy status to other drugs, medicaments and biological substances status: Secondary | ICD-10-CM | POA: Insufficient documentation

## 2013-07-23 DIAGNOSIS — R6884 Jaw pain: Secondary | ICD-10-CM | POA: Insufficient documentation

## 2013-07-23 DIAGNOSIS — K0889 Other specified disorders of teeth and supporting structures: Secondary | ICD-10-CM

## 2013-07-23 DIAGNOSIS — K089 Disorder of teeth and supporting structures, unspecified: Secondary | ICD-10-CM | POA: Insufficient documentation

## 2013-07-23 DIAGNOSIS — K029 Dental caries, unspecified: Secondary | ICD-10-CM | POA: Insufficient documentation

## 2013-07-23 DIAGNOSIS — Z885 Allergy status to narcotic agent status: Secondary | ICD-10-CM | POA: Insufficient documentation

## 2013-07-23 DIAGNOSIS — M26629 Arthralgia of temporomandibular joint, unspecified side: Secondary | ICD-10-CM

## 2013-07-23 DIAGNOSIS — F17200 Nicotine dependence, unspecified, uncomplicated: Secondary | ICD-10-CM | POA: Insufficient documentation

## 2013-07-23 MED ORDER — CYCLOBENZAPRINE HCL 10 MG PO TABS: 10.0000 mg | ORAL_TABLET | Freq: Three times a day (TID) | ORAL | Status: DC | PRN

## 2013-07-23 MED ORDER — OXYCODONE-ACETAMINOPHEN 5-325 MG PO TABS
2.0000 | ORAL_TABLET | Freq: Once | ORAL | Status: AC
  Administered 2013-07-23: 2 via ORAL
  Filled 2013-07-23: qty 2

## 2013-07-23 MED ORDER — PENICILLIN V POTASSIUM 500 MG PO TABS: 500.0000 mg | ORAL_TABLET | Freq: Four times a day (QID) | ORAL | Status: AC

## 2013-07-23 MED ORDER — OXYCODONE-ACETAMINOPHEN 5-325 MG PO TABS: 1.0000 | ORAL_TABLET | Freq: Four times a day (QID) | ORAL | Status: DC | PRN

## 2013-07-23 MED ORDER — PENICILLIN V POTASSIUM 250 MG PO TABS
500.0000 mg | ORAL_TABLET | Freq: Once | ORAL | Status: AC
  Administered 2013-07-23: 500 mg via ORAL
  Filled 2013-07-23: qty 2

## 2013-07-23 MED ORDER — CYCLOBENZAPRINE HCL 10 MG PO TABS
5.0000 mg | ORAL_TABLET | Freq: Once | ORAL | Status: AC
  Administered 2013-07-23: 5 mg via ORAL
  Filled 2013-07-23: qty 1

## 2013-07-23 NOTE — ED Provider Notes (Signed)
Medical screening examination/treatment/procedure(s) were performed by non-physician practitioner and as supervising physician I was immediately available for consultation/collaboration.    Eren Ryser M Tiyana Galla, MD 07/23/13 0622 
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
## 2013-07-23 NOTE — ED Provider Notes (Signed)
CSN: [id number]     Arrival date & time 07/23/13  0125 History   First MD Initiated Contact with Patient 07/23/13 0243     Chief Complaint  Patient presents with  . Facial Pain   HPI  History provided by the patient. Patient is a 27 year old male who presents with complaints of persistent and worsening left facial pain. Patient reports having pain in her left TMJ the past 2 days. He has been taking Tylenol and ibuprofen at home seemed to help slightly. Today in late this evening pain became significantly worse. The pain radiates towards the left ear. It is worse with opening and closing his mouth. He denies any injuries or trauma. He does report having some ongoing pains to his left lower molar tooth. Denies any swelling of the gums or face. Denies any fever, chills or sweats. No other aggravating or alleviating factors. No other associated symptoms.    History reviewed. No pertinent past medical history. History reviewed. No pertinent past surgical history. History reviewed. No pertinent family history. History  Substance Use Topics  . Smoking status: Current Some Day Smoker  . Smokeless tobacco: Not on file  . Alcohol Use: Yes    Review of Systems  Constitutional: Negative for fever, chills and fatigue.  HENT: Positive for ear pain. Negative for facial swelling, hearing loss, trouble swallowing and voice change.   Respiratory: Negative for cough.   Gastrointestinal: Negative for nausea and vomiting.  All other systems reviewed and are negative.    Allergies  Vicodin; Codeine; and Tramadol  Home Medications   Current Outpatient Rx  Name  Route  Sig  Dispense  Refill  . acetaminophen (TYLENOL) 325 MG tablet   Oral   Take 650 mg by mouth every 6 (six) hours as needed for mild pain.          BP 156/105  Pulse 75  Temp(Src) 98.2 F (36.8 C) (Oral)  Resp 18  Ht 6\' 1"  (1.854 m)  Wt 155 lb 8 oz (70.534 kg)  BMI 20.52 kg/m2  SpO2 99% Physical Exam  Nursing note and  vitals reviewed. Constitutional: He is oriented to person, place, and time. He appears well-developed and well-nourished.  HENT:  Head: Normocephalic.  Right Ear: Tympanic membrane normal.  Left Ear: Tympanic membrane normal.  Mouth/Throat: Oropharynx is clear and moist.  Poor dentition throughout. Multiple dental caries some decay of teeth. There is decaying fracturing of the left lower molar tooth. No sniff and swelling of the gums. No swelling of the tongue. Patient has significant reproducible pains over the left TMJ. There is no significant popping or clicking. No swelling. Skin is normal without erythema.  Cardiovascular: Normal rate and regular rhythm.   Pulmonary/Chest: Effort normal and breath sounds normal. No stridor. No respiratory distress. He has no wheezes. He has no rales.  Abdominal: Soft.  Lymphadenopathy:    He has no cervical adenopathy.  Neurological: He is alert and oriented to person, place, and time.  Skin: Skin is warm.  Psychiatric: He has a normal mood and affect. His behavior is normal.    ED Course  Procedures    DIAGNOSTIC STUDIES: Oxygen Saturation is 99% on room air.    COORDINATION OF CARE:  Nursing notes reviewed. Vital signs reviewed. Initial pt interview and examination performed.   3:00 AM patient seen and evaluated. Patient appears uncomfortable in pain but no acute distress. Discussed treatment plan with patient which includes pain medicine and muscle relaxer. I also discussed  possibilities of dental infection for which we will give initial dose of antibiotics. Patient agrees with this plan. We will provide referral to dentist to call and followup with greater today.   Treatment plan initiated: Medications  cyclobenzaprine (FLEXERIL) tablet 5 mg (5 mg Oral Given 07/23/13 0258)  oxyCODONE-acetaminophen (PERCOCET/ROXICET) 5-325 MG per tablet 2 tablet (2 tablets Oral Given 07/23/13 0257)  penicillin v potassium (VEETID) tablet 500 mg (500 mg  Oral Given 07/23/13 0257)      MDM   1. Pain, dental   2. TMJ arthralgia        Angus Seller, PA-C 07/23/13 [phone]

## 2013-11-21 ENCOUNTER — Emergency Department (HOSPITAL_COMMUNITY)
Admission: EM | Admit: 2013-11-21 | Discharge: 2013-11-21 | Disposition: A | Discharge status: Home / Self Care | Payer: BC Managed Care – PPO | Attending: Emergency Medicine | Admitting: Emergency Medicine

## 2013-11-21 ENCOUNTER — Encounter (HOSPITAL_COMMUNITY): Payer: Self-pay | Admitting: Emergency Medicine

## 2013-11-21 DIAGNOSIS — Y9389 Activity, other specified: Secondary | ICD-10-CM | POA: Insufficient documentation

## 2013-11-21 DIAGNOSIS — Y929 Unspecified place or not applicable: Secondary | ICD-10-CM | POA: Insufficient documentation

## 2013-11-21 DIAGNOSIS — S39012A Strain of muscle, fascia and tendon of lower back, initial encounter: Secondary | ICD-10-CM

## 2013-11-21 DIAGNOSIS — F17200 Nicotine dependence, unspecified, uncomplicated: Secondary | ICD-10-CM | POA: Insufficient documentation

## 2013-11-21 DIAGNOSIS — Y990 Civilian activity done for income or pay: Secondary | ICD-10-CM | POA: Insufficient documentation

## 2013-11-21 DIAGNOSIS — X500XXA Overexertion from strenuous movement or load, initial encounter: Secondary | ICD-10-CM | POA: Insufficient documentation

## 2013-11-21 DIAGNOSIS — S335XXA Sprain of ligaments of lumbar spine, initial encounter: Secondary | ICD-10-CM | POA: Insufficient documentation

## 2013-11-21 DIAGNOSIS — Z8739 Personal history of other diseases of the musculoskeletal system and connective tissue: Secondary | ICD-10-CM | POA: Insufficient documentation

## 2013-11-21 HISTORY — DX: Dorsalgia, unspecified: M54.9

## 2013-11-21 MED ORDER — OXYCODONE-ACETAMINOPHEN 5-325 MG PO TABS
1.0000 | ORAL_TABLET | Freq: Once | ORAL | Status: AC
  Administered 2013-11-21: 1 via ORAL
  Filled 2013-11-21: qty 1

## 2013-11-21 MED ORDER — DIAZEPAM 5 MG PO TABS
5.0000 mg | ORAL_TABLET | Freq: Once | ORAL | Status: AC
  Administered 2013-11-21: 5 mg via ORAL
  Filled 2013-11-21: qty 1

## 2013-11-21 MED ORDER — IBUPROFEN 800 MG PO TABS: 800.0000 mg | ORAL_TABLET | Freq: Three times a day (TID) | ORAL | Status: DC

## 2013-11-21 MED ORDER — KETOROLAC TROMETHAMINE 60 MG/2ML IM SOLN
60.0000 mg | Freq: Once | INTRAMUSCULAR | Status: AC
  Administered 2013-11-21: 60 mg via INTRAMUSCULAR
  Filled 2013-11-21: qty 2

## 2013-11-21 MED ORDER — DIAZEPAM 5 MG PO TABS: 5.0000 mg | ORAL_TABLET | Freq: Two times a day (BID) | ORAL | Status: DC

## 2013-11-21 NOTE — Discharge Instructions (Signed)
Take valium as needed for severe muscle pain/spasm.  Do not drive within four hours of taking this medication (may cause drowsiness or confusion).   Take ibuprofen as well; up to 800mg  three times a day with food.  Apply a heating pad or ice pack to sore muscles and avoid activities that aggravate pain.  You should return to the ER if you develop change in or worsening of pain, fever (100.5 degrees or greater), inability to walk due to leg weakness or loss of control of bladder/bowels.

## 2013-11-21 NOTE — ED Notes (Addendum)
C/o lower back pain that radiates down R leg since yesterday.  Pt states, "I think I just twisted it the wrong way moving a box at work."

## 2013-11-21 NOTE — ED Provider Notes (Signed)
CSN: [id number]     Arrival date & time 11/21/13  0205 History   First MD Initiated Contact with Patient 11/21/13 [phone]     Chief Complaint  Patient presents with  . Back Pain     (Consider location/radiation/quality/duration/timing/severity/associated sxs/prior Treatment) HPI History provided by pt.   Pt presents w/ right low back pain since yesterday morning.  Started while lifting a heavy car part while twisting at work.  Pain constant, non-radiating, no relief w/ tylenol.  No associated fever, urinary sx, bowel dysfunction, abd pain, LE weakness/paresthesias.  Has bulging discs of cervical spine but otherwise no pertinent PMH.   Past Medical History  Diagnosis Date  . Back pain    History reviewed. No pertinent past surgical history. No family history on file. History  Substance Use Topics  . Smoking status: Current Some Day Smoker  . Smokeless tobacco: Not on file  . Alcohol Use: Yes    Review of Systems  All other systems reviewed and are negative.      Allergies  Vicodin; Codeine; and Tramadol  Home Medications   Current Outpatient Rx  Name  Route  Sig  Dispense  Refill  . acetaminophen (TYLENOL) 500 MG tablet   Oral   Take 1,000 mg by mouth every 6 (six) hours as needed for moderate pain.          BP 129/49  Pulse 91  Temp(Src) 98.1 F (36.7 C) (Oral)  Resp 18  Wt 158 lb 5 oz (71.81 kg)  SpO2 96% Physical Exam  Nursing note and vitals reviewed. Constitutional: He is oriented to person, place, and time. He appears well-developed and well-nourished.  HENT:  Head: Normocephalic and atraumatic.  Eyes:  Normal appearance  Neck: Normal range of motion.  Cardiovascular: Normal rate and regular rhythm.   Pulmonary/Chest: Effort normal and breath sounds normal.  Genitourinary:  No CVA ttp  Musculoskeletal:  Lumbar spine non-tender.  Mild tenderness at R SI joint. Full active ROM of LE w/ minimal aggravation of back pain.  Nml patellar reflexes.  No  saddle anesthesia. Distal sensation intact.  2+ DP pulses.    Neurological: He is alert and oriented to person, place, and time.  Skin: Skin is warm and dry. No rash noted.  Psychiatric: He has a normal mood and affect. His behavior is normal.    ED Course  Procedures (including critical care time) Labs Review Labs Reviewed - No data to display Imaging Review No results found.   EKG Interpretation None      MDM   Final diagnoses:  Lumbar strain    28yo healthy M presents w/ R low back pain that started while lifting/twisting at work.  Afebrile,  NAD, localized tenderness SI joint, no NV deficits BLE, ambulatory.  Pt received one percocet, one valium and 60mg  IM toradol w/ improvement in pain.  Prescribed short course of valium and 800mg  ibuprofen.  He has been in the process of finding a neurosurgeon because new job/health insurance.  Has a PCP.  Return precautions discussed.     Otilio Miuatherine E Amaia Lavallie, PA-C 11/21/13 1616  Arie Sabinaatherine E Gill Delrossi, PA-C 11/21/13 1616

## 2013-11-24 NOTE — ED Provider Notes (Signed)
Medical screening examination/treatment/procedure(s) were performed by non-physician practitioner and as supervising physician I was immediately available for consultation/collaboration.   EKG Interpretation None        Maneh Sieben, MD 11/24/13 0814 

## 2014-05-30 ENCOUNTER — Encounter (HOSPITAL_COMMUNITY): Payer: Self-pay | Admitting: Emergency Medicine

## 2014-05-30 ENCOUNTER — Emergency Department (HOSPITAL_COMMUNITY)
Admission: EM | Admit: 2014-05-30 | Discharge: 2014-05-30 | Disposition: A | Discharge status: Home / Self Care | Payer: BC Managed Care – PPO | Attending: Emergency Medicine | Admitting: Emergency Medicine

## 2014-05-30 DIAGNOSIS — K088 Other specified disorders of teeth and supporting structures: Secondary | ICD-10-CM | POA: Insufficient documentation

## 2014-05-30 DIAGNOSIS — K0889 Other specified disorders of teeth and supporting structures: Secondary | ICD-10-CM

## 2014-05-30 DIAGNOSIS — Z720 Tobacco use: Secondary | ICD-10-CM | POA: Insufficient documentation

## 2014-05-30 MED ORDER — OXYCODONE HCL 5 MG PO TABS: 5.0000 mg | ORAL_TABLET | Freq: Two times a day (BID) | ORAL | Status: DC | PRN

## 2014-05-30 MED ORDER — OXYCODONE HCL 5 MG PO TABS
5.0000 mg | ORAL_TABLET | Freq: Once | ORAL | Status: AC
  Administered 2014-05-30: 5 mg via ORAL
  Filled 2014-05-30: qty 1

## 2014-05-30 MED ORDER — BUPIVACAINE-EPINEPHRINE (PF) 0.25% -1:200000 IJ SOLN
10.0000 mL | Freq: Once | INTRAMUSCULAR | Status: AC
  Administered 2014-05-30: 10 mL
  Filled 2014-05-30 (×2): qty 10

## 2014-05-30 NOTE — ED Provider Notes (Signed)
CSN: [id number]     Arrival date & time 05/30/14  [id number] History   First MD Initiated Contact with Patient 05/30/14 0403     Chief Complaint  Patient presents with  . Dental Pain     (Consider location/radiation/quality/duration/timing/severity/associated sxs/prior Treatment) HPI Jon Castro is a 28 y.o. male with no symptoms past medical history coming in with dental pain. Patient states he's had intermittent pain has been worse in the past 2 days. It is his right upper teeth, tooth #2. It radiates up his cheekbone. He got significantly worse today, he tried Goody's powder and Motrin without any relief. Patient states he has not been to dentist because she does not have insurance, and has not had the money to pay for visit. He denies any fevers or recent infections. He denies any swelling or drainage of pus from the area. Patient has no further complaints.  10 Systems reviewed and are negative for acute change except as noted in the HPI.     Past Medical History  Diagnosis Date  . Back pain    History reviewed. No pertinent past surgical history. History reviewed. No pertinent family history. History  Substance Use Topics  . Smoking status: Current Some Day Smoker  . Smokeless tobacco: Not on file  . Alcohol Use: No    Review of Systems    Allergies  Vicodin; Codeine; and Tramadol  Home Medications   Prior to Admission medications   Medication Sig Start Date End Date Taking? Authorizing Provider  Aspirin-Acetaminophen-Caffeine (GOODY HEADACHE PO) Take 1 packet by mouth once as needed (for pain).   Yes Historical Provider, MD  ibuprofen (ADVIL,MOTRIN) 200 MG tablet Take 800 mg by mouth every 6 (six) hours as needed for moderate pain.   Yes Historical Provider, MD  oxyCODONE (ROXICODONE) 5 MG immediate release tablet Take 1 tablet (5 mg total) by mouth 2 (two) times daily as needed for severe pain or breakthrough pain. 05/30/14   Tomasita CrumbleAdeleke Tashema Tiller, MD   BP 145/96  Pulse 78   Temp(Src) 98.2 F (36.8 C) (Oral)  Resp 22  SpO2 99% Physical Exam  Nursing note and vitals reviewed. Constitutional: He is oriented to person, place, and time. Vital signs are normal. He appears well-developed and well-nourished.  Non-toxic appearance. He does not appear ill. He appears distressed.  HENT:  Head: Normocephalic and atraumatic.  Nose: Nose normal.  Mouth/Throat: Oropharynx is clear and moist. No oropharyngeal exudate.  Tooth #2 is tipped down to the root. There is tenderness to palpation. There is no swelling or drainage seen.  Eyes: Conjunctivae and EOM are normal. Pupils are equal, round, and reactive to light. No scleral icterus.  Neck: Normal range of motion. Neck supple. No tracheal deviation, no edema, no erythema and normal range of motion present. No mass and no thyromegaly present.  Cardiovascular: Normal rate, regular rhythm, S1 normal, S2 normal, normal heart sounds, intact distal pulses and normal pulses.  Exam reveals no gallop and no friction rub.   No murmur heard. Pulses:      Radial pulses are 2+ on the right side, and 2+ on the left side.       Dorsalis pedis pulses are 2+ on the right side, and 2+ on the left side.  Pulmonary/Chest: Effort normal and breath sounds normal. No respiratory distress. He has no wheezes. He has no rhonchi. He has no rales.  Abdominal: Soft. Normal appearance and bowel sounds are normal. He exhibits no distension, no ascites and no  mass. There is no hepatosplenomegaly. There is no tenderness. There is no rebound, no guarding and no CVA tenderness.  Musculoskeletal: Normal range of motion. He exhibits no edema and no tenderness.  Lymphadenopathy:    He has no cervical adenopathy.  Neurological: He is alert and oriented to person, place, and time. He has normal strength. No cranial nerve deficit or sensory deficit. GCS eye subscore is 4. GCS verbal subscore is 5. GCS motor subscore is 6.  Skin: Skin is warm, dry and intact. No  petechiae and no rash noted. He is not diaphoretic. No erythema. No pallor.  Psychiatric: He has a normal mood and affect. His behavior is normal. Judgment normal.    ED Course  Procedures (including critical care time) Labs Review Labs Reviewed - No data to display  Imaging Review No results found.   EKG Interpretation None      MDM   Final diagnoses:  Pain, dental    Patient presents emergency department out of concern for dental pain. He was given nerve block in emergency department will be sent home with a small prescription for oxycodone. He was given resources to followup with a dentist to have her teeth pulled or get a root canal. Patient was educated that he did not need antibiotics as is no evidence of infection. Return precautions given. His vital signs remained stable he is safe for discharge.  NERVE BLOCK Performed by: Tomasita Crumble Consent: Verbal consent obtained. Required items: required blood products, implants, devices, and special equipment available Time out: Immediately prior to procedure a "time out" was called to verify the correct patient, procedure, equipment, support staff and site/side marked as required.  Indication: Dental pain  Nerve block body site: Posterior superior alveolar nerve block   Preparation: Patient was prepped and draped in the usual sterile fashion. Needle gauge: 21 G Location technique: anatomical landmarks  Local anesthetic: 0.25% Bupivacaine with epinephrine   Anesthetic total: 8 ml  Outcome: pain improved Patient tolerance: Patient tolerated the procedure well with no immediate complications.     Tomasita Crumble, MD 05/30/14 [phone]

## 2014-05-30 NOTE — Discharge Instructions (Signed)
Dental Pain Jon Castro, you were seen today for dental pain.  You were given a nerve block and will be sent home with a small amount of pain medicine.  Follow up with a dentist to have the tooth pulled or get a root canal.   You were given resources.  If there is any worsening or swelling or drainage of pus, come back to the ED for repeat evaluation.  Thank you. Toothache is pain in or around a tooth. It may get worse with chewing or with cold or heat.  HOME CARE  Your dentist may use a numbing medicine during treatment. If so, you may need to avoid eating until the medicine wears off. Ask your dentist about this.  Only take medicine as told by your dentist or doctor.  Avoid chewing food near the painful tooth until after all treatment is done. Ask your dentist about this. GET HELP RIGHT AWAY IF:   The problem gets worse or new problems appear.  You have a fever.  There is redness and puffiness (swelling) of the face, jaw, or neck.  You cannot open your mouth.  There is pain in the jaw.  There is very bad pain that is not helped by medicine. MAKE SURE YOU:   Understand these instructions.  Will watch your condition.  Will get help right away if you are not doing well or get worse. Document Released: 02/01/2008 Document Revised: 11/07/2011 Document Reviewed: 02/01/2008 [facility] Patient Information 2015 [place], [place]. This information is not intended to replace advice given to you by your health care provider. Make sure you discuss any questions you have with your health care provider.

## 2014-05-30 NOTE — ED Notes (Signed)
Patient presents with c/o pain from a broken tooth to the top right in the back.  Stated that it started hurting yesterday.

## 2014-05-30 NOTE — ED Notes (Signed)
C/o toothache onset yest states pain is worse today

## 2014-12-09 ENCOUNTER — Encounter (HOSPITAL_COMMUNITY): Payer: Self-pay | Admitting: Emergency Medicine

## 2014-12-09 DIAGNOSIS — R509 Fever, unspecified: Secondary | ICD-10-CM | POA: Insufficient documentation

## 2014-12-09 DIAGNOSIS — R079 Chest pain, unspecified: Secondary | ICD-10-CM | POA: Insufficient documentation

## 2014-12-09 DIAGNOSIS — M25552 Pain in left hip: Secondary | ICD-10-CM | POA: Insufficient documentation

## 2014-12-09 DIAGNOSIS — M25551 Pain in right hip: Secondary | ICD-10-CM | POA: Insufficient documentation

## 2014-12-09 DIAGNOSIS — R51 Headache: Secondary | ICD-10-CM | POA: Insufficient documentation

## 2014-12-09 DIAGNOSIS — R05 Cough: Secondary | ICD-10-CM | POA: Insufficient documentation

## 2014-12-09 DIAGNOSIS — Z720 Tobacco use: Secondary | ICD-10-CM | POA: Insufficient documentation

## 2014-12-09 NOTE — ED Notes (Signed)
Pt. reports fever , dry cough , bilateral hip pain onset yesterday , denies injury , no emesis or diarrhea.

## 2014-12-10 ENCOUNTER — Emergency Department (HOSPITAL_COMMUNITY)
Admission: EM | Admit: 2014-12-10 | Discharge: 2014-12-10 | Disposition: A | Discharge status: Home / Self Care | Payer: BLUE CROSS/BLUE SHIELD | Attending: Emergency Medicine | Admitting: Emergency Medicine

## 2014-12-10 ENCOUNTER — Emergency Department (HOSPITAL_COMMUNITY): Payer: BLUE CROSS/BLUE SHIELD

## 2014-12-10 DIAGNOSIS — R05 Cough: Secondary | ICD-10-CM

## 2014-12-10 DIAGNOSIS — R059 Cough, unspecified: Secondary | ICD-10-CM

## 2014-12-10 DIAGNOSIS — R6889 Other general symptoms and signs: Secondary | ICD-10-CM

## 2014-12-10 DIAGNOSIS — R509 Fever, unspecified: Secondary | ICD-10-CM

## 2014-12-10 DIAGNOSIS — M2550 Pain in unspecified joint: Secondary | ICD-10-CM

## 2014-12-10 LAB — COMPREHENSIVE METABOLIC PANEL
ALBUMIN: 3.7 g/dL (ref 3.5–5.2)
ALK PHOS: 68 U/L (ref 39–117)
ALT: 15 U/L (ref 0–53)
AST: 21 U/L (ref 0–37)
Anion gap: 10 (ref 5–15)
BUN: 5 mg/dL — ABNORMAL LOW (ref 6–23)
CHLORIDE: 101 mmol/L (ref 96–112)
CO2: 23 mmol/L (ref 19–32)
CREATININE: 1.22 mg/dL (ref 0.50–1.35)
Calcium: 8.8 mg/dL (ref 8.4–10.5)
GFR calc Af Amer: >90 mL/min (ref >90)
GFR calc non Af Amer: 78 mL/min — ABNORMAL LOW (ref >90)
Glucose, Bld: 119 mg/dL — ABNORMAL HIGH (ref 70–99)
Potassium: 3.5 mmol/L (ref 3.5–5.1)
Sodium: 134 mmol/L — ABNORMAL LOW (ref 135–145)
Total Bilirubin: 0.6 mg/dL (ref 0.3–1.2)
Total Protein: 7.2 g/dL (ref 6.0–8.3)

## 2014-12-10 LAB — CBC WITH DIFFERENTIAL/PLATELET
Basophils Absolute: 0 10*3/uL (ref 0.0–0.1)
Basophils Relative: 1 % (ref 0–1)
EOS PCT: 1 % (ref 0–5)
Eosinophils Absolute: 0 10*3/uL (ref 0.0–0.7)
HCT: 41.2 % (ref 39.0–52.0)
Hemoglobin: 14.5 g/dL (ref 13.0–17.0)
LYMPHS ABS: 0.8 10*3/uL (ref 0.7–4.0)
Lymphocytes Relative: 13 % (ref 12–46)
MCH: 29.4 pg (ref 26.0–34.0)
MCHC: 35.2 g/dL (ref 30.0–36.0)
MCV: 83.4 fL (ref 78.0–100.0)
MONOS PCT: 14 % — AB (ref 3–12)
Monocytes Absolute: 0.9 10*3/uL (ref 0.1–1.0)
NEUTROS PCT: 72 % (ref 43–77)
Neutro Abs: 4.7 10*3/uL (ref 1.7–7.7)
Platelets: 176 10*3/uL (ref 150–400)
RBC: 4.94 MIL/uL (ref 4.22–5.81)
RDW: 12.9 % (ref 11.5–15.5)
WBC: 6.5 10*3/uL (ref 4.0–10.5)

## 2014-12-10 LAB — I-STAT CHEM 8, ED
BUN: 5 mg/dL — ABNORMAL LOW (ref 6–23)
CHLORIDE: 102 mmol/L (ref 96–112)
CREATININE: 1.1 mg/dL (ref 0.50–1.35)
Calcium, Ion: 1.05 mmol/L — ABNORMAL LOW (ref 1.12–1.23)
GLUCOSE: 122 mg/dL — AB (ref 70–99)
HCT: 41 % (ref 39.0–52.0)
HEMOGLOBIN: 13.9 g/dL (ref 13.0–17.0)
Potassium: 3.4 mmol/L — ABNORMAL LOW (ref 3.5–5.1)
Sodium: 137 mmol/L (ref 135–145)
TCO2: 18 mmol/L (ref 0–100)

## 2014-12-10 LAB — I-STAT CG4 LACTIC ACID, ED: Lactic Acid, Venous: 0.54 mmol/L (ref 0.5–2.0)

## 2014-12-10 MED ORDER — IBUPROFEN 600 MG PO TABS: 600.0000 mg | ORAL_TABLET | Freq: Four times a day (QID) | ORAL | Status: DC | PRN

## 2014-12-10 MED ORDER — KETOROLAC TROMETHAMINE 30 MG/ML IJ SOLN
INTRAMUSCULAR | Status: AC
  Filled 2014-12-10: qty 1

## 2014-12-10 MED ORDER — METOCLOPRAMIDE HCL 5 MG/ML IJ SOLN
INTRAMUSCULAR | Status: AC
  Filled 2014-12-10: qty 2

## 2014-12-10 MED ORDER — ACETAMINOPHEN 325 MG PO TABS
ORAL_TABLET | ORAL | Status: AC
  Filled 2014-12-10: qty 2

## 2014-12-10 NOTE — ED Provider Notes (Signed)
CSN: [id number]     Arrival date & time 12/09/14  2342 History   First MD Initiated Contact with Patient 12/10/14 0022     Chief Complaint  Patient presents with  . Fever  . Cough  . Hip Pain     (Consider location/radiation/quality/duration/timing/severity/associated sxs/prior Treatment) HPI Comments: Jon Castro is a 29 y.o. male who presents to the Emergency Department complaining of constant headache and productive cough with yellow phlegm that began 1 day ago. Pt states that the headache is localized to the temporal regions. The headache is moderately severe since Onset and not extremely severe.. Pt has never had symptoms like this before. He denies numbness, tingling, visual changes, neck pain, neck stiffness, or any other symptoms. He also complains of pleuritic chest pain when he coughs in the midsternal region. Pt also mentions rhinorrhea, nasal congestion, sore throat, and bilateral hip pain. Pt has been taking Tylenol and NyQuil without relief. He denies IV drug abuse. Denies recent sick contact with similar symptoms. Denies abdominal pain, nausea, vomiting, diarrhea, penile discharge, flank pain, or any other symptoms. Pt did not get flu vaccine this year.  Patient is a 29 y.o. male presenting with fever, cough, and hip pain. The history is provided by the patient.  Fever Associated symptoms: cough   Associated symptoms: no chest pain and no dysuria   Cough Associated symptoms: fever   Associated symptoms: no chest pain and no shortness of breath   Hip Pain Pertinent negatives include no chest pain, no abdominal pain and no shortness of breath.    Past Medical History  Diagnosis Date  . Back pain    History reviewed. No pertinent past surgical history. No family history on file. History  Substance Use Topics  . Smoking status: Current Some Day Smoker  . Smokeless tobacco: Not on file  . Alcohol Use: No    Review of Systems  Constitutional: Positive for  fever and activity change. Negative for appetite change.  Respiratory: Positive for cough. Negative for shortness of breath.   Cardiovascular: Negative for chest pain.  Gastrointestinal: Negative for abdominal pain.  Genitourinary: Negative for dysuria.  Musculoskeletal: Positive for arthralgias.  All other systems reviewed and are negative.     Allergies  Vicodin; Codeine; and Tramadol  Home Medications   Prior to Admission medications   Medication Sig Start Date End Date Taking? Authorizing Provider  ibuprofen (ADVIL,MOTRIN) 600 MG tablet Take 1 tablet (600 mg total) by mouth every 6 (six) hours as needed. 12/10/14   Derwood Kaplan, MD  oxyCODONE (ROXICODONE) 5 MG immediate release tablet Take 1 tablet (5 mg total) by mouth 2 (two) times daily as needed for severe pain or breakthrough pain. Patient not taking: Reported on 12/10/2014 05/30/14   Tomasita Crumble, MD   BP 110/65 mmHg  Pulse 64  Temp(Src) 98.1 F (36.7 C) (Oral)  Resp 16  SpO2 100% Physical Exam  Constitutional: He is oriented to person, place, and time. He appears well-developed.  HENT:  Head: Normocephalic and atraumatic.  Eyes: Conjunctivae and EOM are normal. Pupils are equal, round, and reactive to light.  Neck: Normal range of motion. Neck supple.  Cardiovascular: Normal rate and regular rhythm.   Pulmonary/Chest: Effort normal and breath sounds normal.  Abdominal: Soft. Bowel sounds are normal. He exhibits no distension. There is no tenderness. There is no rebound and no guarding.  Musculoskeletal: He exhibits tenderness. He exhibits no edema.  Internal and external rotation of the hip is normal bilaterally.  Extension and flexion has normal ROM bilaterally. Lower extremity strength is 4+/5 bilaterally. Tenderness over the posterior pelvic brim bilaterally; no erythema or edema overlying bilateral hip joint.   Neurological: He is alert and oriented to person, place, and time.  Skin: Skin is warm. No rash noted.   Nursing note and vitals reviewed.   ED Course  Procedures (including critical care time) Labs Review Labs Reviewed  CBC WITH DIFFERENTIAL/PLATELET - Abnormal; Notable for the following:    Monocytes Relative 14 (*)    All other components within normal limits  COMPREHENSIVE METABOLIC PANEL - Abnormal; Notable for the following:    Sodium 134 (*)    Glucose, Bld 119 (*)    BUN 5 (*)    GFR calc non Af Amer 78 (*)    All other components within normal limits  I-STAT CHEM 8, ED - Abnormal; Notable for the following:    Potassium 3.4 (*)    BUN 5 (*)    Glucose, Bld 122 (*)    Calcium, Ion 1.05 (*)    All other components within normal limits  I-STAT CG4 LACTIC ACID, ED    Imaging Review Dg Chest 2 View  12/10/2014   CLINICAL DATA:  Cough and fever for 2 days.  EXAM: CHEST  2 VIEW  COMPARISON:  None.  FINDINGS: The cardiomediastinal contours are normal. The lungs are clear. Pulmonary vasculature is normal. No consolidation, pleural effusion, or pneumothorax. No acute osseous abnormalities are seen.  IMPRESSION: No acute pulmonary process.   Electronically Signed   By: Rubye OaksMelanie  Ehinger M.D.   On: 12/10/2014 02:03     EKG Interpretation None      MDM   Final diagnoses:  Flu-like symptoms  Arthralgia    Pt comes in with cc of, what appears to be flu. He has URI like sx, cough, aches, headaches, chills, arthralgias. High fevers, thus labs ordered - and are neg. Fever responded to tylenol. No meningeal signs. Pt has hip pain, bilateral - there is no evidence of infection and the ROM of the hip, active and passive is intact. Pt has no hx of STD, and has no discharge right now. No ivda. We doubt septic joint, suspect viral syndrome related arthralgias. Still, strict return precautions discussed - pt will return to the ER if his pain is getting worse, he is unable to ambulate, he has sweats, confusion.    Derwood KaplanAnkit Syrah Daughtrey, MD 12/10/14 [phone]

## 2014-12-10 NOTE — ED Notes (Signed)
Medications were given, see paper chart during down time

## 2014-12-10 NOTE — ED Notes (Signed)
Pt verbalized understanding of d/c instructions and has no further questions.  

## 2014-12-10 NOTE — Discharge Instructions (Signed)
We think what you have is a viral syndrome - the treatment for which is symptomatic relief only, and your body will fight the infection off in a few days. We are prescribing you some meds for pain and fevers. See your primary care doctor in 1 week if the symptoms dont improve.  Please return to the ER if your symptoms worsen; you have increased pain, fevers, chills, inability to keep any medications down, confusion. Otherwise see the outpatient doctor as requested.   Viral Infections A viral infection can be caused by different types of viruses.Most viral infections are not serious and resolve on their own. However, some infections may cause severe symptoms and may lead to further complications. SYMPTOMS Viruses can frequently cause:  Minor sore throat.  Aches and pains.  Headaches.  Runny nose.  Different types of rashes.  Watery eyes.  Tiredness.  Cough.  Loss of appetite.  Gastrointestinal infections, resulting in nausea, vomiting, and diarrhea. These symptoms do not respond to antibiotics because the infection is not caused by bacteria. However, you might catch a bacterial infection following the viral infection. This is sometimes called a "superinfection." Symptoms of such a bacterial infection may include:  Worsening sore throat with pus and difficulty swallowing.  Swollen neck glands.  Chills and a high or persistent fever.  Severe headache.  Tenderness over the sinuses.  Persistent overall ill feeling (malaise), muscle aches, and tiredness (fatigue).  Persistent cough.  Yellow, green, or brown mucus production with coughing. HOME CARE INSTRUCTIONS   Only take over-the-counter or prescription medicines for pain, discomfort, diarrhea, or fever as directed by your caregiver.  Drink enough water and fluids to keep your urine clear or pale yellow. Sports drinks can provide valuable electrolytes, sugars, and hydration.  Get plenty of rest and maintain proper  nutrition. Soups and broths with crackers or rice are fine. SEEK IMMEDIATE MEDICAL CARE IF:   You have severe headaches, shortness of breath, chest pain, neck pain, or an unusual rash.  You have uncontrolled vomiting, diarrhea, or you are unable to keep down fluids.  You or your child has an oral temperature above 102 F (38.9 C), not controlled by medicine.  Your baby is older than 3 months with a rectal temperature of 102 F (38.9 C) or higher.  Your baby is 563 months old or younger with a rectal temperature of 100.4 F (38 C) or higher. MAKE SURE YOU:   Understand these instructions.  Will watch your condition.  Will get help right away if you are not doing well or get worse. Document Released: 05/25/2005 Document Revised: 11/07/2011 Document Reviewed: 12/20/2010 [facility]Exit[facility] Patient Information 2015 [place]ExitCare, [place]LLC. This information is not intended to replace advice given to you by your health care provider. Make sure you discuss any questions you have with your health care provider.

## 2015-01-20 ENCOUNTER — Emergency Department (HOSPITAL_COMMUNITY)
Admission: EM | Admit: 2015-01-20 | Discharge: 2015-01-21 | Disposition: A | Discharge status: Home / Self Care | Payer: Self-pay | Attending: Emergency Medicine | Admitting: Emergency Medicine

## 2015-01-20 ENCOUNTER — Encounter (HOSPITAL_COMMUNITY): Payer: Self-pay | Admitting: Emergency Medicine

## 2015-01-20 DIAGNOSIS — Y9289 Other specified places as the place of occurrence of the external cause: Secondary | ICD-10-CM | POA: Insufficient documentation

## 2015-01-20 DIAGNOSIS — T401X1A Poisoning by heroin, accidental (unintentional), initial encounter: Secondary | ICD-10-CM | POA: Insufficient documentation

## 2015-01-20 DIAGNOSIS — R000 Tachycardia, unspecified: Secondary | ICD-10-CM | POA: Insufficient documentation

## 2015-01-20 DIAGNOSIS — F1210 Cannabis abuse, uncomplicated: Secondary | ICD-10-CM | POA: Insufficient documentation

## 2015-01-20 DIAGNOSIS — Y998 Other external cause status: Secondary | ICD-10-CM | POA: Insufficient documentation

## 2015-01-20 DIAGNOSIS — F1410 Cocaine abuse, uncomplicated: Secondary | ICD-10-CM | POA: Insufficient documentation

## 2015-01-20 DIAGNOSIS — Y9389 Activity, other specified: Secondary | ICD-10-CM | POA: Insufficient documentation

## 2015-01-20 DIAGNOSIS — Z720 Tobacco use: Secondary | ICD-10-CM | POA: Insufficient documentation

## 2015-01-20 DIAGNOSIS — X58XXXA Exposure to other specified factors, initial encounter: Secondary | ICD-10-CM | POA: Insufficient documentation

## 2015-01-20 NOTE — ED Notes (Signed)
Bed: WA12 Expected date:  Expected time:  Means of arrival:  Comments: EMS heroin overdose/29 yo male/intra-nasal narcan 2 mg, tachycardic

## 2015-01-20 NOTE — ED Notes (Signed)
Pt presents via EMS for heroin OD. Pt admits to same. 1mg  Narcan intranasally before EMS arrival. Denies CP/SOB. A&O x4 upon arrival.   400cc NS in route. 18g Left AC.    VS: 130HR, 120/90, 97%RA, 202CBG.

## 2015-01-21 LAB — COMPREHENSIVE METABOLIC PANEL
ALBUMIN: 3.8 g/dL (ref 3.5–5.0)
ALK PHOS: 89 U/L (ref 38–126)
ALT: 17 U/L (ref 17–63)
ANION GAP: 8 (ref 5–15)
AST: 21 U/L (ref 15–41)
BILIRUBIN TOTAL: 0.4 mg/dL (ref 0.3–1.2)
BUN: 15 mg/dL (ref 6–20)
CHLORIDE: 104 mmol/L (ref 101–111)
CO2: 25 mmol/L (ref 22–32)
CREATININE: 1.12 mg/dL (ref 0.61–1.24)
Calcium: 8.3 mg/dL — ABNORMAL LOW (ref 8.9–10.3)
Glucose, Bld: 186 mg/dL — ABNORMAL HIGH (ref 65–99)
POTASSIUM: 3.5 mmol/L (ref 3.5–5.1)
Sodium: 137 mmol/L (ref 135–145)
Total Protein: 7.4 g/dL (ref 6.5–8.1)

## 2015-01-21 LAB — RAPID URINE DRUG SCREEN, HOSP PERFORMED
AMPHETAMINES: NOT DETECTED
BARBITURATES: NOT DETECTED
Benzodiazepines: NOT DETECTED
Cocaine: POSITIVE — AB
Opiates: POSITIVE — AB
TETRAHYDROCANNABINOL: POSITIVE — AB

## 2015-01-21 LAB — CBC
HEMATOCRIT: 43.5 % (ref 39.0–52.0)
Hemoglobin: 14.2 g/dL (ref 13.0–17.0)
MCH: 28.2 pg (ref 26.0–34.0)
MCHC: 32.6 g/dL (ref 30.0–36.0)
MCV: 86.5 fL (ref 78.0–100.0)
PLATELETS: 270 10*3/uL (ref 150–400)
RBC: 5.03 MIL/uL (ref 4.22–5.81)
RDW: 13.2 % (ref 11.5–15.5)
WBC: 18.8 10*3/uL — AB (ref 4.0–10.5)

## 2015-01-21 LAB — SALICYLATE LEVEL: Salicylate Lvl: <4 mg/dL (ref 2.8–30.0)

## 2015-01-21 LAB — ACETAMINOPHEN LEVEL: Acetaminophen (Tylenol), Serum: <10 ug/mL — ABNORMAL LOW (ref 10–30)

## 2015-01-21 LAB — ETHANOL

## 2015-01-21 NOTE — Discharge Instructions (Signed)
Narcotic Overdose Mr. Jon RileyMitchell, do not use heroin in the future. See primary care physician within 3 days for close follow-up and to help you quit. If any symptoms worsen come back to the emergency department immediately. Thank you. A narcotic overdose is the misuse or overuse of a narcotic drug. A narcotic overdose can make you pass out and stop breathing. If you are not treated right away, this can cause permanent brain damage or stop your heart. Medicine may be given to reverse the effects of an overdose. If so, this medicine may bring on withdrawal symptoms. The symptoms may be abdominal cramps, throwing up (vomiting), sweating, chills, and nervousness. Injecting narcotics can cause more problems than just an overdose. AIDS, hepatitis, and other very serious infections are transmitted by sharing needles and syringes. If you decide to quit using, there are medicines which can help you through the withdrawal period. Trying to quit all at once on your own can be uncomfortable, but not life-threatening. Call your caregiver, Narcotics Anonymous, or any drug and alcohol treatment program for further help.  Document Released: 09/22/2004 Document Revised: 11/07/2011 Document Reviewed: 07/17/2009 [facility] CenterExitCare Patient Information 2015 [place]ExitCare, [place]LL[place]. This information is not intended to replace advice given to you by your health care provider. Make sure you discuss any questions you have with your health care provider.

## 2015-01-21 NOTE — ED Provider Notes (Signed)
CSN: [id number]     Arrival date & time 01/20/15  2332 History   First MD Initiated Contact with Patient 01/21/15 0000     Chief Complaint  Patient presents with  . Drug Overdose     (Consider location/radiation/quality/duration/timing/severity/associated sxs/prior Treatment) HPI  Jon Castro is a 29 y.o. male with no second past medical history presenting tonight with heroin overdose. Patient states he used heroin around 10:30 PM with a friend. He was given Narcan by EMS with good response. Patient believes that the heroin was laced with something else as he completely passed out which is not normal for him. He denies using any other illicit substances tonight. He denies any beer. Patient states he was using heroin in celebration of his recent job that he obtained. He admits to loss of consciousness. He currently is not having pain anywhere. Patient has no further complaints.  10 Systems reviewed and are negative for acute change except as noted in the HPI.    Past Medical History  Diagnosis Date  . Back pain    History reviewed. No pertinent past surgical history. No family history on file. History  Substance Use Topics  . Smoking status: Current Some Day Smoker  . Smokeless tobacco: Not on file  . Alcohol Use: No    Review of Systems    Allergies  Vicodin; Codeine; and Tramadol  Home Medications   Prior to Admission medications   Medication Sig Start Date End Date Taking? Authorizing Provider  ibuprofen (ADVIL,MOTRIN) 600 MG tablet Take 1 tablet (600 mg total) by mouth every 6 (six) hours as needed. 12/10/14   Derwood Kaplan, MD  oxyCODONE (ROXICODONE) 5 MG immediate release tablet Take 1 tablet (5 mg total) by mouth 2 (two) times daily as needed for severe pain or breakthrough pain. Patient not taking: Reported on 12/10/2014 05/30/14   Tomasita Crumble, MD   BP 126/88 mmHg  Pulse 120  Temp(Src) 97.9 F (36.6 C) (Oral)  Resp 15  SpO2 100% Physical Exam   Constitutional: He is oriented to person, place, and time. Vital signs are normal. He appears well-developed and well-nourished.  Non-toxic appearance. He does not appear ill. No distress.  HENT:  Head: Normocephalic and atraumatic.  Nose: Nose normal.  Mouth/Throat: Oropharynx is clear and moist. No oropharyngeal exudate.  Eyes: Conjunctivae and EOM are normal. Pupils are equal, round, and reactive to light. No scleral icterus.  Pinpoint pupils bilaterally.  Neck: Normal range of motion. Neck supple. No tracheal deviation, no edema, no erythema and normal range of motion present. No thyroid mass and no thyromegaly present.  Cardiovascular: Regular rhythm, S1 normal, S2 normal, normal heart sounds, intact distal pulses and normal pulses.  Exam reveals no gallop and no friction rub.   No murmur heard. Pulses:      Radial pulses are 2+ on the right side, and 2+ on the left side.       Dorsalis pedis pulses are 2+ on the right side, and 2+ on the left side.  Tachycardic  Pulmonary/Chest: Effort normal and breath sounds normal. No respiratory distress. He has no wheezes. He has no rhonchi. He has no rales.  Abdominal: Soft. Normal appearance and bowel sounds are normal. He exhibits no distension, no ascites and no mass. There is no hepatosplenomegaly. There is no tenderness. There is no rebound, no guarding and no CVA tenderness.  Musculoskeletal: Normal range of motion. He exhibits no edema or tenderness.  Lymphadenopathy:    He has no  cervical adenopathy.  Neurological: He is alert and oriented to person, place, and time. He has normal strength. No cranial nerve deficit or sensory deficit. He exhibits normal muscle tone.  Normal strength and sensation in all extremities.  Skin: Skin is warm, dry and intact. No petechiae and no rash noted. He is not diaphoretic. No erythema. No pallor.  Psychiatric: He has a normal mood and affect. His behavior is normal. Judgment normal.  Nursing note and  vitals reviewed.   ED Course  Procedures (including critical care time) Labs Review Labs Reviewed  CBC - Abnormal; Notable for the following:    WBC 18.8 (*)    All other components within normal limits  COMPREHENSIVE METABOLIC PANEL - Abnormal; Notable for the following:    Glucose, Bld 186 (*)    Calcium 8.3 (*)    All other components within normal limits  ACETAMINOPHEN LEVEL - Abnormal; Notable for the following:    Acetaminophen (Tylenol), Serum <10 (*)    All other components within normal limits  URINE RAPID DRUG SCREEN (HOSP PERFORMED) - Abnormal; Notable for the following:    Opiates POSITIVE (*)    Cocaine POSITIVE (*)    Tetrahydrocannabinol POSITIVE (*)    All other components within normal limits  ETHANOL  SALICYLATE LEVEL    Imaging Review No results found.   EKG Interpretation   Date/Time:  Tuesday Jan 20 2015 23:35:39 EDT Ventricular Rate:  131 PR Interval:  134 QRS Duration: 95 QT Interval:  317 QTC Calculation: 468 R Axis:   89 Text Interpretation:  Sinus tachycardia Consider inferior infarct  Anterolateral Q wave, probably normal for age tachycardia now present  Confirmed by Erroll Lunani, Chrisha Vogel Ayokunle [phone]([id number]) on 01/21/2015 2:33:59 AM      MDM   Final diagnoses:  None   patient presents emergency department for heroin overdose. He denies using any other illicit substances. His heart rate was initially 1:30, and is now 120 during my assessment the room. He was given a 1 L IV fluid bolus. Will retain the patient in the emergency department until his vital signs normalized.  Patient's heart has normalized down to 91 after IV fluids. He continues to appear well in no acute distress. Urine drug screen is positive for cocaine, explaining his vital sign abnormalities. This is likely with a heroin was laced with. He is advised to follow the primary care physician within 3 days. His vital signs were within his normal limits and he is safe for  discharge.  Tomasita CrumbleAdeleke Sally-Ann Cutbirth, MD 01/21/15 [phone]

## 2017-02-24 IMAGING — CR DG CHEST 2V
2 series · 2 of 2 positions shown · non-contrast
Comparison: None.

CLINICAL DATA: Cough and fever for 2 days.

EXAM:
CHEST  2 VIEW

[chest pa]
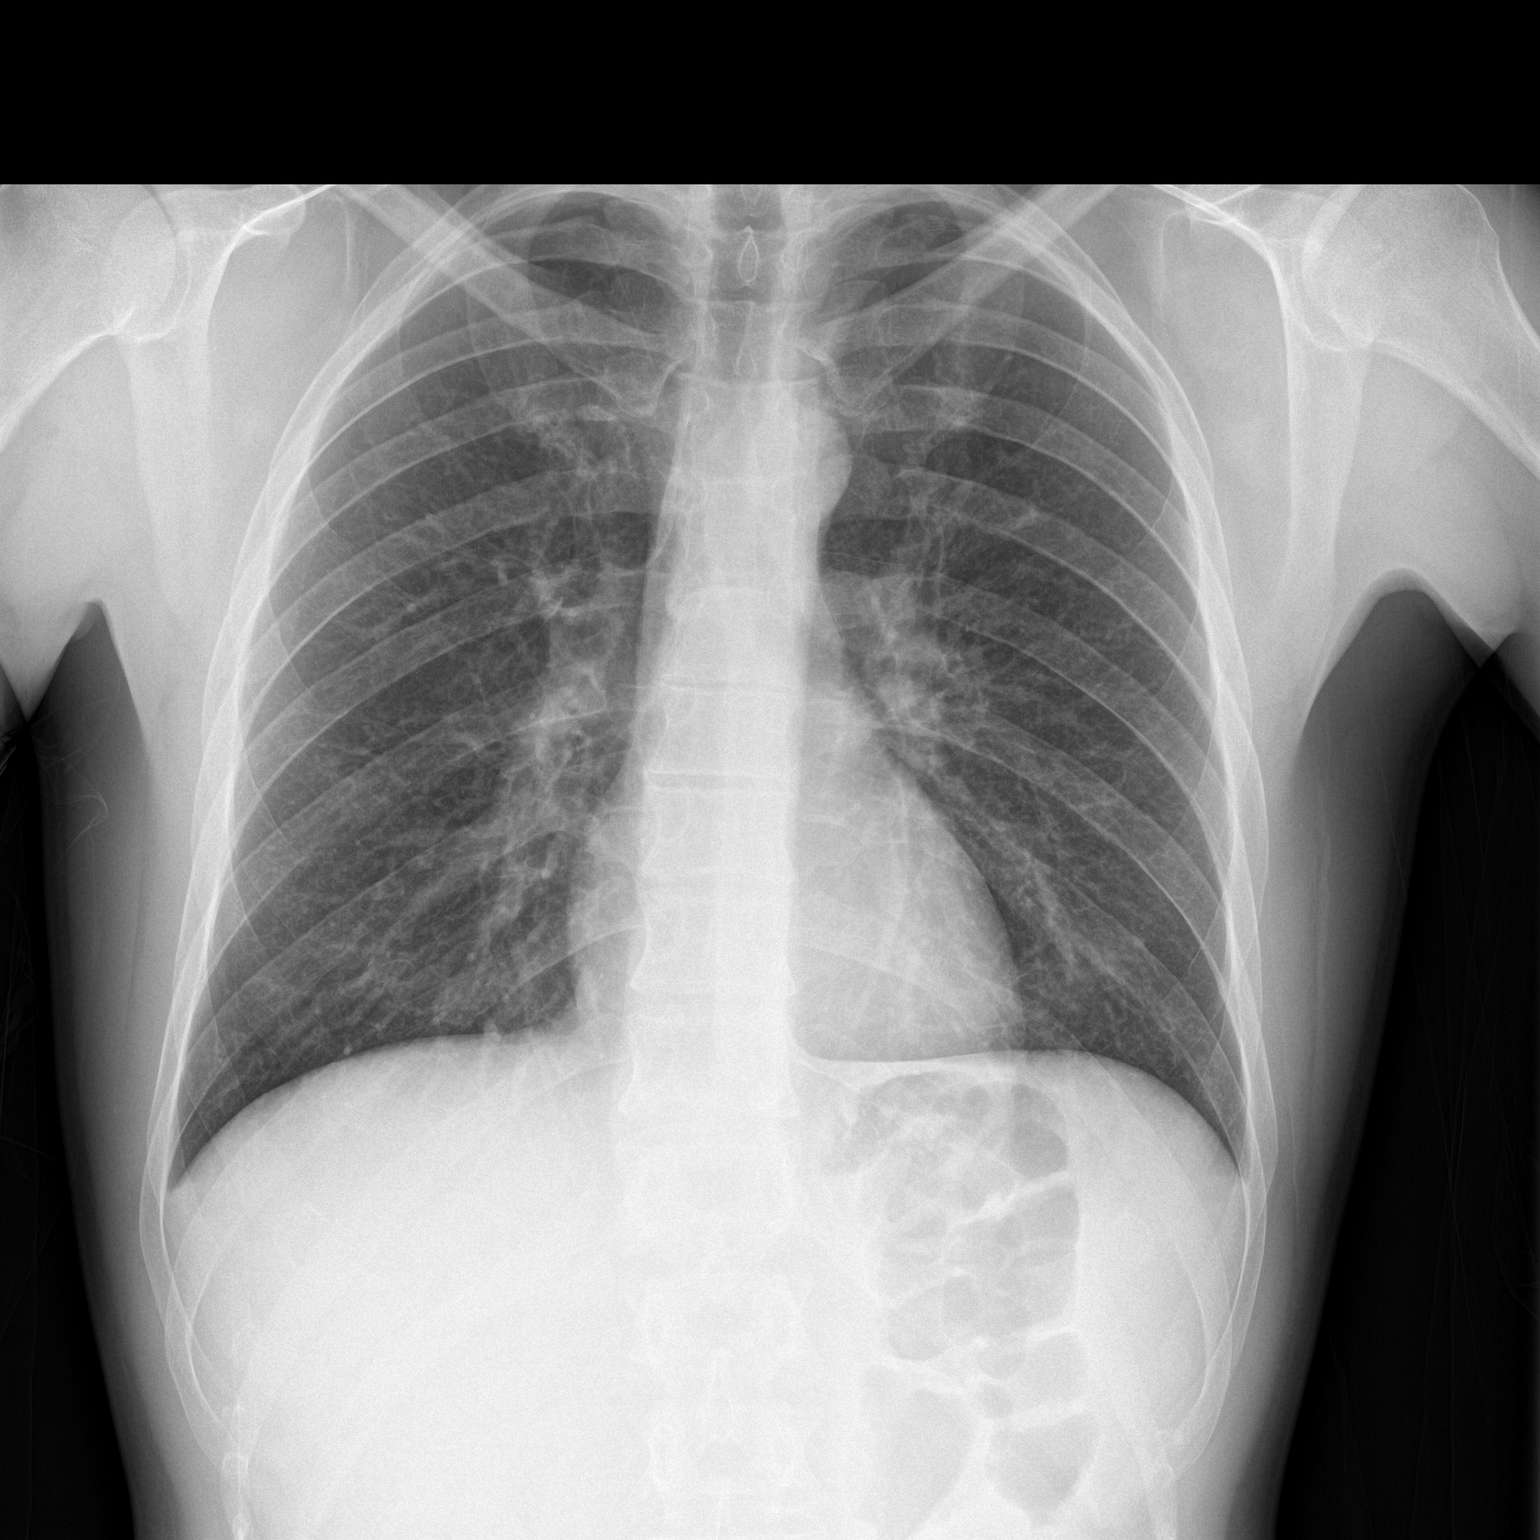

[chest lat]
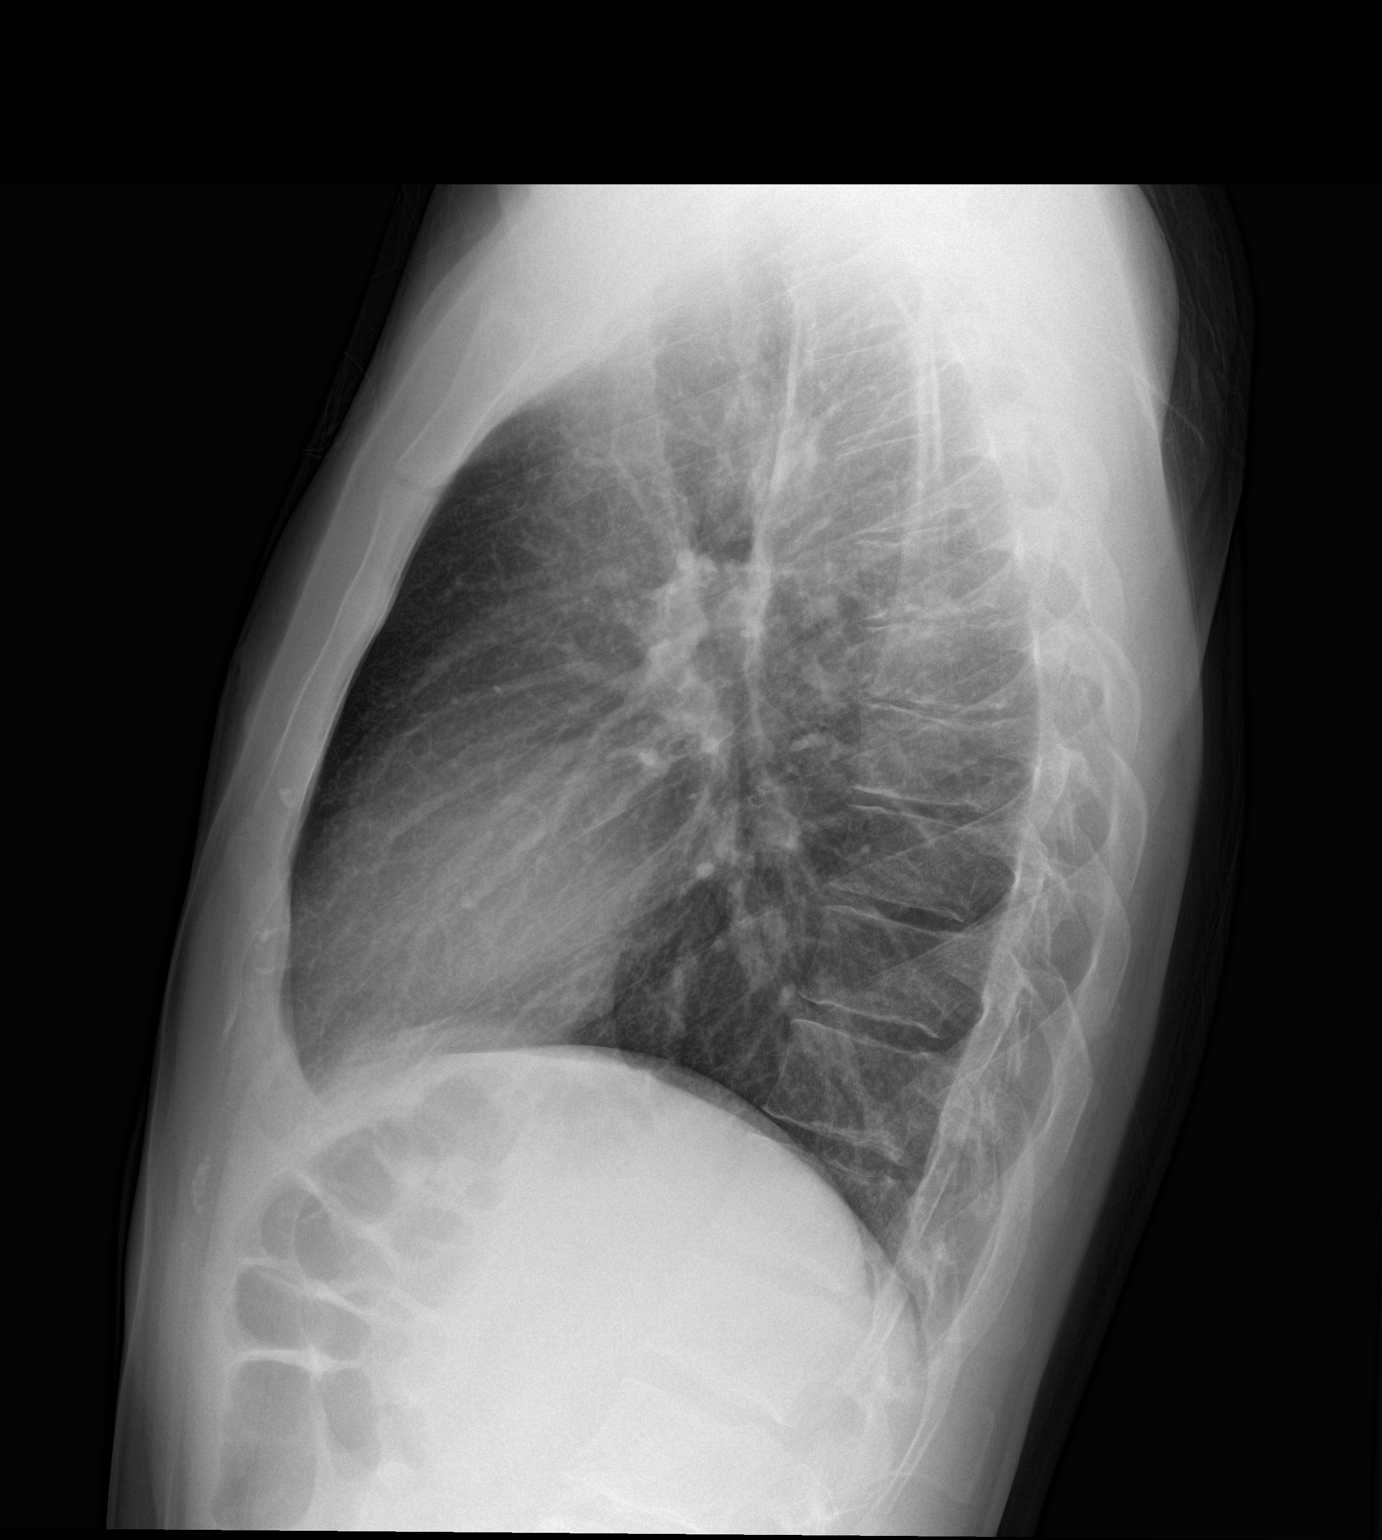

[2 of 2 positions shown; findings below may reference images not displayed]

FINDINGS: The cardiomediastinal contours are normal. The lungs are clear.
Pulmonary vasculature is normal. No consolidation, pleural effusion,
or pneumothorax. No acute osseous abnormalities are seen.
IMPRESSION: No acute pulmonary process.

## 2017-03-18 ENCOUNTER — Emergency Department (HOSPITAL_COMMUNITY)
Admission: EM | Admit: 2017-03-18 | Discharge: 2017-03-18 | Disposition: A | Discharge status: Home / Self Care | Payer: Self-pay | Attending: Emergency Medicine | Admitting: Emergency Medicine

## 2017-03-18 ENCOUNTER — Encounter (HOSPITAL_COMMUNITY): Payer: Self-pay | Admitting: Emergency Medicine

## 2017-03-18 DIAGNOSIS — F17200 Nicotine dependence, unspecified, uncomplicated: Secondary | ICD-10-CM | POA: Insufficient documentation

## 2017-03-18 DIAGNOSIS — L02413 Cutaneous abscess of right upper limb: Secondary | ICD-10-CM | POA: Insufficient documentation

## 2017-03-18 DIAGNOSIS — E876 Hypokalemia: Secondary | ICD-10-CM | POA: Insufficient documentation

## 2017-03-18 LAB — CBC WITH DIFFERENTIAL/PLATELET
BASOS ABS: 0.2 10*3/uL — AB (ref 0.0–0.1)
Basophils Relative: 1 %
EOS ABS: 0.3 10*3/uL (ref 0.0–0.7)
Eosinophils Relative: 2 %
HCT: 37.2 % — ABNORMAL LOW (ref 39.0–52.0)
Hemoglobin: 13.1 g/dL (ref 13.0–17.0)
LYMPHS ABS: 3.2 10*3/uL (ref 0.7–4.0)
LYMPHS PCT: 19 %
MCH: 28.5 pg (ref 26.0–34.0)
MCHC: 35.2 g/dL (ref 30.0–36.0)
MCV: 80.9 fL (ref 78.0–100.0)
MONOS PCT: 8 %
Monocytes Absolute: 1.3 10*3/uL — ABNORMAL HIGH (ref 0.1–1.0)
Neutro Abs: 11.7 10*3/uL — ABNORMAL HIGH (ref 1.7–7.7)
Neutrophils Relative %: 70 %
Platelets: 295 10*3/uL (ref 150–400)
RBC: 4.6 MIL/uL (ref 4.22–5.81)
RDW: 13.7 % (ref 11.5–15.5)
WBC: 16.7 10*3/uL — AB (ref 4.0–10.5)

## 2017-03-18 LAB — COMPREHENSIVE METABOLIC PANEL
ALBUMIN: 3.3 g/dL — AB (ref 3.5–5.0)
ALT: 21 U/L (ref 17–63)
AST: 19 U/L (ref 15–41)
Alkaline Phosphatase: 64 U/L (ref 38–126)
Anion gap: 8 (ref 5–15)
BUN: 8 mg/dL (ref 6–20)
CHLORIDE: 97 mmol/L — AB (ref 101–111)
CO2: 27 mmol/L (ref 22–32)
Calcium: 8.6 mg/dL — ABNORMAL LOW (ref 8.9–10.3)
Creatinine, Ser: 0.97 mg/dL (ref 0.61–1.24)
GFR calc Af Amer: >60 mL/min (ref >60)
GFR calc non Af Amer: >60 mL/min (ref >60)
GLUCOSE: 103 mg/dL — AB (ref 65–99)
POTASSIUM: 3 mmol/L — AB (ref 3.5–5.1)
Sodium: 132 mmol/L — ABNORMAL LOW (ref 135–145)
Total Bilirubin: 0.6 mg/dL (ref 0.3–1.2)
Total Protein: 6.3 g/dL — ABNORMAL LOW (ref 6.5–8.1)

## 2017-03-18 MED ORDER — LIDOCAINE-EPINEPHRINE (PF) 2 %-1:200000 IJ SOLN
10.0000 mL | Freq: Once | INTRAMUSCULAR | Status: AC
  Administered 2017-03-18: 10 mL
  Filled 2017-03-18: qty 20

## 2017-03-18 MED ORDER — POTASSIUM CHLORIDE CRYS ER 20 MEQ PO TBCR: 40.0000 meq | EXTENDED_RELEASE_TABLET | Freq: Once | ORAL | Status: DC

## 2017-03-18 MED ORDER — CLINDAMYCIN HCL 150 MG PO CAPS: 300.0000 mg | ORAL_CAPSULE | Freq: Four times a day (QID) | ORAL | 0 refills | Status: DC

## 2017-03-18 MED ORDER — CLINDAMYCIN PHOSPHATE 600 MG/50ML IV SOLN
600.0000 mg | Freq: Once | INTRAVENOUS | Status: AC
  Administered 2017-03-18: 600 mg via INTRAVENOUS
  Filled 2017-03-18: qty 50

## 2017-03-18 NOTE — ED Provider Notes (Signed)
[facility]-EMERGENCY DEPT Provider Note   CSN: [id number] Arrival date & time: 03/18/17  0736     History   Chief Complaint Chief Complaint  Patient presents with  . Abscess    HPI Jon Castro is a 31 y.o. male.  Patient presents with complaint of right antecubital abscess which started approximately 1 week ago but became much more swollen and painful over the past 2 days. There has been some streaking redness up the arm. No fevers or chills. No nausea or vomiting. Patient has been using warm compresses at home without relief. He admits to previously using heroin but states that he is in rehabilitation and has not been injecting recently. No other complaints. The onset of this condition was acute. The course is constant. Aggravating factors: movement and palpation. Alleviating factors: none.        Past Medical History:  Diagnosis Date  . Back pain     There are no active problems to display for this patient.   No past surgical history on file.     Home Medications    Prior to Admission medications   Medication Sig Start Date End Date Taking? Authorizing Provider  ibuprofen (ADVIL,MOTRIN) 600 MG tablet Take 1 tablet (600 mg total) by mouth every 6 (six) hours as needed. 12/10/14   Derwood KaplanNanavati, Ankit, MD  oxyCODONE (ROXICODONE) 5 MG immediate release tablet Take 1 tablet (5 mg total) by mouth 2 (two) times daily as needed for severe pain or breakthrough pain. Patient not taking: Reported on 12/10/2014 05/30/14   Tomasita Crumbleni, Adeleke, MD    Family History History reviewed. No pertinent family history.  Social History Social History  Substance Use Topics  . Smoking status: Current Some Day Smoker  . Smokeless tobacco: Never Used  . Alcohol use No     Allergies   Vicodin [hydrocodone-acetaminophen]; Codeine; and Tramadol   Review of Systems Review of Systems  Constitutional: Negative for fever.  HENT: Negative for rhinorrhea and sore throat.   Eyes: Negative for redness.   Respiratory: Negative for cough.   Cardiovascular: Negative for chest pain.  Gastrointestinal: Negative for abdominal pain, diarrhea, nausea and vomiting.  Genitourinary: Negative for dysuria.  Musculoskeletal: Negative for myalgias.  Skin: Positive for color change. Negative for rash.       Positive for abscess  Neurological: Negative for headaches.  Hematological: Negative for adenopathy.     Physical Exam Updated Vital Signs BP 139/87   Pulse (!) 108   Temp 98.4 F (36.9 C) (Oral)   Resp 20   Ht 6\' 1"  (1.854 m)   Wt 74.8 kg (165 lb)   SpO2 100%   BMI 21.77 kg/m   Physical Exam  Constitutional: He appears well-developed and well-nourished.  HENT:  Head: Normocephalic and atraumatic.  Eyes: Conjunctivae are normal. Right eye exhibits no discharge. Left eye exhibits no discharge.  Neck: Normal range of motion. Neck supple.  Cardiovascular: Regular rhythm and normal heart sounds.  Tachycardia present.   Pulmonary/Chest: Effort normal and breath sounds normal.  Abdominal: Soft. There is no tenderness.  Neurological: He is alert.  Skin: Skin is warm and dry.  Approximately 5cm R AC abscess with some lymphangitis noted proximally. Very tender. No active drainage.   Psychiatric: He has a normal mood and affect.  Nursing note and vitals reviewed.    ED Treatments / Results  Labs (all labs ordered are listed, but only abnormal results are displayed) Labs Reviewed  CBC WITH DIFFERENTIAL/PLATELET - Abnormal; Notable for  the following:       Result Value   WBC 16.7 (*)    HCT 37.2 (*)    Neutro Abs 11.7 (*)    Monocytes Absolute 1.3 (*)    Basophils Absolute 0.2 (*)    All other components within normal limits  COMPREHENSIVE METABOLIC PANEL - Abnormal; Notable for the following:    Sodium 132 (*)    Potassium 3.0 (*)    Chloride 97 (*)    Glucose, Bld 103 (*)    Calcium 8.6 (*)    Total Protein 6.3 (*)    Albumin 3.3 (*)    All other components within normal  limits  AEROBIC CULTURE (SUPERFICIAL SPECIMEN)    EKG  EKG Interpretation None       Radiology No results found.  Procedures .Marland KitchenIncision and Drainage Date/Time: 03/18/2017 8:47 AM Performed by: Renne Crigler Authorized by: Renne Crigler   Consent:    Consent obtained:  Verbal   Consent given by:  Patient   Risks discussed:  Bleeding, incomplete drainage, pain and infection   Alternatives discussed:  No treatment Location:    Type:  Abscess   Size:  5cm   Location:  Upper extremity   Upper extremity location:  Elbow   Elbow location:  R elbow Pre-procedure details:    Skin preparation:  Betadine Anesthesia (see MAR for exact dosages):    Anesthesia method:  Local infiltration   Local anesthetic:  Lidocaine 2% WITH epi Procedure type:    Complexity:  Complex Procedure details:    Incision types:  Stab incision   Incision depth:  Subcutaneous   Scalpel blade:  11   Techniques: did not probe due to location and close proximity of vascular structures.   Drainage:  Purulent   Drainage amount:  Copious   Packing materials:  1/4 in iodoform gauze Post-procedure details:    Patient tolerance of procedure:  Tolerated well, no immediate complications    (including critical care time)  Medications Ordered in ED Medications  potassium chloride SA (K-DUR,KLOR-CON) CR tablet 40 mEq (not administered)  lidocaine-EPINEPHrine (XYLOCAINE W/EPI) 2 %-1:200000 (PF) injection 10 mL (10 mLs Infiltration Given 03/18/17 0828)  clindamycin (CLEOCIN) IVPB 600 mg (0 mg Intravenous Stopped 03/18/17 0909)     Initial Impression / Assessment and Plan / ED Course  I have reviewed the triage vital signs and the nursing notes.  Pertinent labs & imaging results that were available during my care of the patient were reviewed by me and considered in my medical decision making (see chart for details).     Patient seen and examined. Work-up initiated. Medications ordered. Discussed with Dr.  Fredderick Phenix.   Vital signs reviewed and are as follows: BP 139/87   Pulse (!) 108   Temp 98.4 F (36.9 C) (Oral)   Resp 20   Ht 6\' 1"  (1.854 m)   Wt 74.8 kg (165 lb)   SpO2 100%   BMI 21.77 kg/m   EMERGENCY DEPARTMENT US SOFT TISSUE INTERPRETATION "Study: Limited Soft Tissue Ultrasound"  INDICATIONS: Pain and Soft tissue infection Multiple views of the body part were obtained in real-time with a multi-frequency linear probe  PERFORMED BY: Myself IMAGES ARCHIVED?: Yes SIDE:Right  BODY PART:Upper extremity INTERPRETATION:  Abcess present and Cellulitis present   9:36 AM Discussed with Dr. Fredderick Phenix who has seen.   Offered patient admission vs outpatient trial if he is willing to return to be rechecked by myself tomorrow.   Patient would rather be  discharged today. He agrees to return sooner if he develops fever, worsening redness. Otherwise will recheck in 24 hours to check progression. If worsening, would need admission at that time.  Wound culture sent.    Final Clinical Impressions(s) / ED Diagnoses   Final diagnoses:  Abscess of right arm  Hypokalemia   Patient with R AC abscess. No fever but leukocytosis. I&D performed by myself after evaluation with ultrasound with good results. Patient appears well, nontoxic. Discharge plan discussed as above. Patient would like to be discharged today and agrees to return tomorrow for close follow-up with myself.  Low potassium noted on lab work -- given PO potassium prior to d/c.   New Prescriptions Discharge Medication List as of 03/18/2017  9:18 AM    START taking these medications   Details  clindamycin (CLEOCIN) 150 MG capsule Take 2 capsules (300 mg total) by mouth every 6 (six) hours., Starting Sat 03/18/2017, Print         Renne Crigler, PA-C 03/18/17 1610    Rolan Bucco, MD 03/18/17 1045

## 2017-03-18 NOTE — ED Triage Notes (Signed)
Pt here for an abscess on his right arm , pt states that it started last Sunday and has got worse

## 2017-03-18 NOTE — Discharge Instructions (Signed)
Please read and follow all provided instructions.  Your diagnoses today include:  1. Abscess of right arm     Tests performed today include:  Vital signs. See below for your results today.   Blood counts and electrolytes - shows elevated infection fighting cells and slightly low potassium  Medications prescribed:   Clindamycin - antibiotic  You have been prescribed an antibiotic medicine: take the entire course of medicine even if you are feeling better. Stopping early can cause the antibiotic not to work.  Take any prescribed medications only as directed.   Home care instructions:   Follow any educational materials contained in this packet  Follow-up instructions: Return to the Emergency Department in 24 hours for a recheck.  Return instructions:  Return to the Emergency Department if you have:  Fever  Worsening symptoms  Worsening pain  Worsening swelling  Redness of the skin that moves away from the affected area, especially if it streaks away from the affected area   Any other emergent concerns  Your vital signs today were: BP 139/87    Pulse (!) 108    Temp 98.4 F (36.9 C) (Oral)    Resp 20    Ht 6\' 1"  (1.854 m)    Wt 74.8 kg (165 lb)    SpO2 100%    BMI 21.77 kg/m  If your blood pressure (BP) was elevated above 135/85 this visit, please have this repeated by your doctor within one month. --------------

## 2017-03-19 ENCOUNTER — Emergency Department (HOSPITAL_COMMUNITY)
Admission: EM | Admit: 2017-03-19 | Discharge: 2017-03-19 | Disposition: A | Discharge status: Home / Self Care | Payer: Self-pay | Attending: Emergency Medicine | Admitting: Emergency Medicine

## 2017-03-19 DIAGNOSIS — F17210 Nicotine dependence, cigarettes, uncomplicated: Secondary | ICD-10-CM | POA: Insufficient documentation

## 2017-03-19 DIAGNOSIS — L02413 Cutaneous abscess of right upper limb: Secondary | ICD-10-CM | POA: Insufficient documentation

## 2017-03-19 NOTE — ED Provider Notes (Signed)
[facility]-EMER[facility]ENCY DEPT Provider Note   CSN: [id number] Arrival date & time: 03/19/17  0756     History   Chief Complaint Chief Complaint  Patient presents with  . Abscess    HPI Jon MadeiraKevin Seevers is a 31 y.o. male.  Patient presents for recheck of right AC abscess incised and drained yesterday by myself. Patient had some associated cellulitis at that time. Since last night, pain, swelling, redness has improved per the patient. Minimal drainage. Packing remains in place. No fevers. Streaking redness on upper arm has resolved. He filled the clindamycin and is taking this.       Past Medical History:  Diagnosis Date  . Back pain     There are no active problems to display for this patient.   No past surgical history on file.     Home Medications    Prior to Admission medications   Medication Sig Start Date End Date Taking? Authorizing Provider  clindamycin (CLEOCIN) 150 MG capsule Take 2 capsules (300 mg total) by mouth every 6 (six) hours. 03/18/17  Yes Renne CriglerGeiple, Andrez Lieurance, PA-C    Family History No family history on file.  Social History Social History  Substance Use Topics  . Smoking status: Current Some Day Smoker  . Smokeless tobacco: Never Used  . Alcohol use No     Allergies   Vicodin [hydrocodone-acetaminophen]; Codeine; and Tramadol   Review of Systems Review of Systems  Constitutional: Negative for fever.  Gastrointestinal: Negative for nausea and vomiting.  Skin: Positive for color change.       Positive for abscess     Physical Exam Updated Vital Signs BP 111/76   Pulse 82   Temp 97.7 F (36.5 C) (Oral)   Resp 18   Ht 6\' 1"  (1.854 m)   Wt 74.8 kg (165 lb)   SpO2 100%   BMI 21.77 kg/m   Physical Exam  Constitutional: He appears well-developed and well-nourished. No distress.  Skin:  Interval improvement in redness and swelling of the abscess in the right AC. Packing is in place with mild amount of purulent crusting drainage. No  lymphangitis today. Area is less tender.     ED Treatments / Results   Radiology No results found.  Procedures Procedures (including critical care time)   Initial Impression / Assessment and Plan / ED Course  I have reviewed the triage vital signs and the nursing notes.  Pertinent labs & imaging results that were available during my care of the patient were reviewed by me and considered in my medical decision making (see chart for details).     The patient was urged to return to the Emergency Department urgently with worsening pain, swelling, expanding erythema especially if it streaks away from the affected area, fever, or if they have any other concerns.   The patient was instructed to remove packing at home in 24 hours. Start warm compresses at that time. If symptoms worsen, he is to return to the emergency department for further evaluation. If continuing to improve, continue supportive care until resolution.  The patient verbalized understanding and stated agreement with this plan.     Final Clinical Impressions(s) / ED Diagnoses   Final diagnoses:  Abscess of right arm   Patient with right arm abscess, improving, agree no indication for admission today. Patient will continue care at home as above, return with worsening.  New Prescriptions Discharge Medication List as of 03/19/2017  8:39 AM       Renne CriglerGeiple, Yittel Emrich,  PA-C 03/19/17 4854    Azalia Bilis, MD 03/19/17 1242

## 2017-03-19 NOTE — Discharge Instructions (Signed)
Please read and follow all provided instructions.  Your diagnoses today include:  1. Abscess of right arm     Tests performed today include:  Vital signs. See below for your results today.   Home care instructions:   Follow any educational materials contained in this packet  Remove packing tomorrow morning and monitor area closely. Apply warm compresses after packing removal.  Follow-up instructions: Return to the Emergency Department in 48 hours for a recheck if your symptoms are not significantly improved.  Return instructions:  Return to the Emergency Department if you have:  Fever  Worsening symptoms  Worsening pain  Worsening swelling  Redness of the skin that moves away from the affected area, especially if it streaks away from the affected area   Any other emergent concerns  Your vital signs today were: BP 111/76    Pulse 82    Temp 97.7 F (36.5 C) (Oral)    Resp 18    Ht 6\' 1"  (1.854 m)    Wt 74.8 kg (165 lb)    SpO2 100%    BMI 21.77 kg/m  If your blood pressure (BP) was elevated above 135/85 this visit, please have this repeated by your doctor within one month. --------------

## 2017-03-19 NOTE — ED Triage Notes (Signed)
Patient came in yesterday and had I&D of right forearm abscess done. Patient now here for a wound recheck and packing removal. Patient states he is taking antix as prescribed. No other c/o.

## 2017-03-20 LAB — AEROBIC CULTURE  (SUPERFICIAL SPECIMEN)

## 2017-03-20 LAB — AEROBIC CULTURE W GRAM STAIN (SUPERFICIAL SPECIMEN): Special Requests: NORMAL

## 2017-03-21 ENCOUNTER — Telehealth: Payer: Self-pay | Admitting: Emergency Medicine

## 2017-03-21 NOTE — Telephone Encounter (Signed)
Post ED Visit - Positive Culture Follow-up  Culture report reviewed by antimicrobial stewardship pharmacist:  []  Enzo BiNathan Batchelder, Pharm.D. []  Celedonio MiyamotoJeremy Frens, Pharm.D., BCPS AQ-ID []  Garvin FilaMike Maccia, Pharm.D., BCPS []  Georgina PillionElizabeth Martin, Pharm.D., BCPS []  BonsallMinh Pham, VermontPharm.D., BCPS, AAHIVP []  Estella HuskMichelle Turner, Pharm.D., BCPS, AAHIVP []  Lysle Pearlachel Rumbarger, PharmD, BCPS []  Casilda Carlsaylor Stone, PharmD, BCPS []  Pollyann SamplesAndy Johnston, PharmD, BCPS Sharin MonsEmily Sinclair PharmD  Positive wound culture Treated with clindamycin, organism sensitive to the same and no further patient follow-up is required at this time.  Berle MullMiller, Melodi Happel 03/21/2017, 10:25 AM

## 2019-02-06 ENCOUNTER — Other Ambulatory Visit: Payer: Self-pay

## 2019-02-06 ENCOUNTER — Emergency Department (HOSPITAL_COMMUNITY)
Admission: EM | Admit: 2019-02-06 | Discharge: 2019-02-07 | Disposition: A | Discharge status: Other Acute Inpatient Hospital | Payer: Self-pay | Attending: Emergency Medicine | Admitting: Emergency Medicine

## 2019-02-06 ENCOUNTER — Encounter (HOSPITAL_COMMUNITY): Payer: Self-pay

## 2019-02-06 DIAGNOSIS — F17200 Nicotine dependence, unspecified, uncomplicated: Secondary | ICD-10-CM | POA: Insufficient documentation

## 2019-02-06 DIAGNOSIS — F1120 Opioid dependence, uncomplicated: Secondary | ICD-10-CM | POA: Insufficient documentation

## 2019-02-06 DIAGNOSIS — F322 Major depressive disorder, single episode, severe without psychotic features: Secondary | ICD-10-CM | POA: Insufficient documentation

## 2019-02-06 DIAGNOSIS — R45851 Suicidal ideations: Secondary | ICD-10-CM | POA: Insufficient documentation

## 2019-02-06 DIAGNOSIS — F1910 Other psychoactive substance abuse, uncomplicated: Secondary | ICD-10-CM | POA: Insufficient documentation

## 2019-02-06 LAB — COMPREHENSIVE METABOLIC PANEL WITH GFR
ALT: 22 U/L (ref 0–44)
AST: 19 U/L (ref 15–41)
Albumin: 3.3 g/dL — ABNORMAL LOW (ref 3.5–5.0)
Alkaline Phosphatase: 68 U/L (ref 38–126)
Anion gap: 8 (ref 5–15)
BUN: 8 mg/dL (ref 6–20)
CO2: 23 mmol/L (ref 22–32)
Calcium: 8.7 mg/dL — ABNORMAL LOW (ref 8.9–10.3)
Chloride: 107 mmol/L (ref 98–111)
Creatinine, Ser: 0.9 mg/dL (ref 0.61–1.24)
GFR calc Af Amer: >60 mL/min
GFR calc non Af Amer: >60 mL/min
Glucose, Bld: 103 mg/dL — ABNORMAL HIGH (ref 70–99)
Potassium: 3.2 mmol/L — ABNORMAL LOW (ref 3.5–5.1)
Sodium: 138 mmol/L (ref 135–145)
Total Bilirubin: 0.6 mg/dL (ref 0.3–1.2)
Total Protein: 7.4 g/dL (ref 6.5–8.1)

## 2019-02-06 LAB — RAPID URINE DRUG SCREEN, HOSP PERFORMED
Amphetamines: NOT DETECTED
Barbiturates: NOT DETECTED
Benzodiazepines: NOT DETECTED
Cocaine: POSITIVE — AB
Opiates: POSITIVE — AB
Tetrahydrocannabinol: POSITIVE — AB

## 2019-02-06 LAB — CBC
HCT: 39.1 % (ref 39.0–52.0)
Hemoglobin: 12.8 g/dL — ABNORMAL LOW (ref 13.0–17.0)
MCH: 28.1 pg (ref 26.0–34.0)
MCHC: 32.7 g/dL (ref 30.0–36.0)
MCV: 85.9 fL (ref 80.0–100.0)
Platelets: 349 10*3/uL (ref 150–400)
RBC: 4.55 MIL/uL (ref 4.22–5.81)
RDW: 13.2 % (ref 11.5–15.5)
WBC: 12.9 10*3/uL — ABNORMAL HIGH (ref 4.0–10.5)
nRBC: 0 % (ref 0.0–0.2)

## 2019-02-06 LAB — ACETAMINOPHEN LEVEL: Acetaminophen (Tylenol), Serum: 22 ug/mL (ref 10–30)

## 2019-02-06 LAB — ETHANOL: Alcohol, Ethyl (B): <10 mg/dL (ref <10)

## 2019-02-06 LAB — SALICYLATE LEVEL: Salicylate Lvl: <7 mg/dL (ref 2.8–30.0)

## 2019-02-06 NOTE — ED Triage Notes (Signed)
Pt sent here from a rehab facility for medical clearance. Pt wanting detox from heroin, last use was a couple hours ago. Pt denies any other drug use or alcohol abuse. Pt alert, calm and cooperative

## 2019-02-06 NOTE — ED Notes (Signed)
Patient called for vitals reassessment, no answer.  

## 2019-02-07 ENCOUNTER — Encounter (HOSPITAL_COMMUNITY): Payer: Self-pay | Admitting: Emergency Medicine

## 2019-02-07 ENCOUNTER — Inpatient Hospital Stay (HOSPITAL_COMMUNITY)
Admission: AD | Admit: 2019-02-07 | Discharge: 2019-02-11 | DRG: 885 | Disposition: A | Discharge status: Home / Self Care | Payer: Federal, State, Local not specified - Other | Source: Intra-hospital | Attending: Psychiatry | Admitting: Psychiatry

## 2019-02-07 ENCOUNTER — Encounter (HOSPITAL_COMMUNITY): Payer: Self-pay

## 2019-02-07 ENCOUNTER — Other Ambulatory Visit: Payer: Self-pay

## 2019-02-07 DIAGNOSIS — Z79899 Other long term (current) drug therapy: Secondary | ICD-10-CM

## 2019-02-07 DIAGNOSIS — F322 Major depressive disorder, single episode, severe without psychotic features: Secondary | ICD-10-CM | POA: Diagnosis present

## 2019-02-07 DIAGNOSIS — Z811 Family history of alcohol abuse and dependence: Secondary | ICD-10-CM

## 2019-02-07 DIAGNOSIS — Z560 Unemployment, unspecified: Secondary | ICD-10-CM

## 2019-02-07 DIAGNOSIS — F17200 Nicotine dependence, unspecified, uncomplicated: Secondary | ICD-10-CM | POA: Diagnosis present

## 2019-02-07 DIAGNOSIS — T40605A Adverse effect of unspecified narcotics, initial encounter: Secondary | ICD-10-CM | POA: Diagnosis present

## 2019-02-07 DIAGNOSIS — R45851 Suicidal ideations: Secondary | ICD-10-CM | POA: Diagnosis present

## 2019-02-07 DIAGNOSIS — G4700 Insomnia, unspecified: Secondary | ICD-10-CM | POA: Diagnosis not present

## 2019-02-07 DIAGNOSIS — F1123 Opioid dependence with withdrawal: Secondary | ICD-10-CM | POA: Diagnosis not present

## 2019-02-07 DIAGNOSIS — Z885 Allergy status to narcotic agent status: Secondary | ICD-10-CM

## 2019-02-07 DIAGNOSIS — F1290 Cannabis use, unspecified, uncomplicated: Secondary | ICD-10-CM | POA: Diagnosis present

## 2019-02-07 DIAGNOSIS — F419 Anxiety disorder, unspecified: Secondary | ICD-10-CM | POA: Diagnosis not present

## 2019-02-07 DIAGNOSIS — F1124 Opioid dependence with opioid-induced mood disorder: Secondary | ICD-10-CM | POA: Diagnosis not present

## 2019-02-07 DIAGNOSIS — F1114 Opioid abuse with opioid-induced mood disorder: Secondary | ICD-10-CM | POA: Diagnosis present

## 2019-02-07 DIAGNOSIS — F1490 Cocaine use, unspecified, uncomplicated: Secondary | ICD-10-CM | POA: Diagnosis present

## 2019-02-07 LAB — ACETAMINOPHEN LEVEL: Acetaminophen (Tylenol), Serum: <10 ug/mL — ABNORMAL LOW (ref 10–30)

## 2019-02-07 MED ORDER — NAPROXEN 500 MG PO TABS
500.0000 mg | ORAL_TABLET | Freq: Two times a day (BID) | ORAL | Status: DC | PRN
  Administered 2019-02-07 – 2019-02-08 (×2): 500 mg via ORAL
  Filled 2019-02-07: qty 1

## 2019-02-07 MED ORDER — POTASSIUM CHLORIDE CRYS ER 10 MEQ PO TBCR
10.0000 meq | EXTENDED_RELEASE_TABLET | Freq: Two times a day (BID) | ORAL | Status: AC
  Administered 2019-02-08 – 2019-02-09 (×3): 10 meq via ORAL
  Filled 2019-02-07 (×3): qty 1

## 2019-02-07 MED ORDER — DICYCLOMINE HCL 20 MG PO TABS
20.0000 mg | ORAL_TABLET | Freq: Four times a day (QID) | ORAL | Status: DC | PRN
  Administered 2019-02-07 – 2019-02-08 (×2): 20 mg via ORAL
  Filled 2019-02-07 (×2): qty 1

## 2019-02-07 MED ORDER — ONDANSETRON 4 MG PO TBDP
4.0000 mg | ORAL_TABLET | Freq: Four times a day (QID) | ORAL | Status: DC | PRN
  Administered 2019-02-07: 4 mg via ORAL
  Filled 2019-02-07: qty 1

## 2019-02-07 MED ORDER — HYDROXYZINE HCL 25 MG PO TABS
25.0000 mg | ORAL_TABLET | Freq: Four times a day (QID) | ORAL | Status: DC | PRN
  Administered 2019-02-07 – 2019-02-10 (×4): 25 mg via ORAL
  Filled 2019-02-07 (×5): qty 1

## 2019-02-07 MED ORDER — CLONIDINE HCL 0.1 MG PO TABS
0.1000 mg | ORAL_TABLET | Freq: Every day | ORAL | Status: DC
  Filled 2019-02-07: qty 1

## 2019-02-07 MED ORDER — TRAZODONE HCL 50 MG PO TABS
50.0000 mg | ORAL_TABLET | Freq: Every evening | ORAL | Status: DC | PRN
  Administered 2019-02-07 – 2019-02-10 (×5): 50 mg via ORAL
  Filled 2019-02-07 (×6): qty 1

## 2019-02-07 MED ORDER — MAGNESIUM HYDROXIDE 400 MG/5ML PO SUSP: 30.0000 mL | Freq: Every day | ORAL | Status: DC | PRN

## 2019-02-07 MED ORDER — NICOTINE 21 MG/24HR TD PT24
21.0000 mg | MEDICATED_PATCH | Freq: Every day | TRANSDERMAL | Status: DC
  Administered 2019-02-08 – 2019-02-11 (×4): 21 mg via TRANSDERMAL
  Filled 2019-02-07 (×5): qty 1

## 2019-02-07 MED ORDER — LOPERAMIDE HCL 2 MG PO CAPS
2.0000 mg | ORAL_CAPSULE | ORAL | Status: DC | PRN
  Administered 2019-02-07: 2 mg via ORAL
  Filled 2019-02-07: qty 1

## 2019-02-07 MED ORDER — METHOCARBAMOL 500 MG PO TABS
500.0000 mg | ORAL_TABLET | Freq: Three times a day (TID) | ORAL | Status: DC | PRN
  Administered 2019-02-07 – 2019-02-08 (×2): 500 mg via ORAL
  Filled 2019-02-07 (×2): qty 1

## 2019-02-07 MED ORDER — CLONIDINE HCL 0.1 MG PO TABS
0.1000 mg | ORAL_TABLET | ORAL | Status: AC
  Administered 2019-02-09 – 2019-02-11 (×4): 0.1 mg via ORAL
  Filled 2019-02-07 (×4): qty 1

## 2019-02-07 MED ORDER — ALUM & MAG HYDROXIDE-SIMETH 200-200-20 MG/5ML PO SUSP
30.0000 mL | ORAL | Status: DC | PRN
Start: 2019-02-07 — End: 2019-02-11

## 2019-02-07 MED ORDER — CLONIDINE HCL 0.1 MG PO TABS
0.1000 mg | ORAL_TABLET | Freq: Four times a day (QID) | ORAL | Status: AC
  Administered 2019-02-07 – 2019-02-09 (×7): 0.1 mg via ORAL
  Filled 2019-02-07 (×9): qty 1

## 2019-02-07 NOTE — ED Notes (Signed)
Diet was ordered for Lunch. 

## 2019-02-07 NOTE — [facility] Suicide Risk Assessment (Signed)
[facility] Admission Suicide Risk Assessment   Nursing information obtained from:  Patient Demographic factors:  Male, Caucasian Current Mental Status:  Suicidal ideation indicated by patient Loss Factors:  Decline in physical health Historical Factors:  Impulsivity Risk Reduction Factors:  Positive therapeutic relationship  Total Time spent with patient: 45 minutes Principal Problem: Opiate Dependence, Opiate Induced Mood Disorder  Diagnosis:  Active Problems:   * No active hospital problems. *  Subjective Data:   Continued Clinical Symptoms:  Alcohol Use Disorder Identification Test Final Score (AUDIT): 0 The "Alcohol Use Disorders Identification Test", Guidelines for Use in Primary Care, Second Edition.  [facility]). Score between 0-7:  no or low risk or alcohol related problems. Score between 8-15:  moderate risk of alcohol related problems. Score between 16-19:  high risk of alcohol related problems. Score 20 or above:  warrants further diagnostic evaluation for alcohol dependence and treatment.   CLINICAL FACTORS:  33 year old male, presented to ED reporting opiate dependence, depression, suicidal ideations. Reports using heroin ( IV) daily. Denies prior psychiatric admissions, attributes depression to substance abuse . Currently reports symptoms of opiate WDL ( cramping, aches, loose stools )   Psychiatric Specialty Exam: Physical Exam  ROS  Blood pressure 119/79, pulse 63, temperature 98.2 F (36.8 C), temperature source Oral, resp. rate 16, height 6\' 1"  (1.854 m), weight 73.5 kg, SpO2 100 %.Body mass index is 21.37 kg/m.  See admit MSE                                                        COGNITIVE FEATURES THAT CONTRIBUTE TO RISK:  Closed-mindedness and Loss of executive function    SUICIDE RISK:   Moderate:  Frequent suicidal ideation with limited intensity, and duration, some specificity in terms of plans, no associated  intent, good self-control, limited dysphoria/symptomatology, some risk factors present, and identifiable protective factors, including available and accessible social support.  PLAN OF CARE: Patient will be admitted to inpatient psychiatric unit for stabilization and safety. Will provide and encourage milieu participation. Provide medication management and maked adjustments as needed. Medication management to help address withdrawal symptoms.  Will follow daily.    I certify that inpatient services furnished can reasonably be expected to improve the patient's condition.   Jenne Campus, MD 02/07/2019, 5:54 PM

## 2019-02-07 NOTE — Tx Team (Signed)
Initial Treatment Plan 02/07/2019 1:58 PM Jon Castro [id number]:[id number]    PATIENT STRESSORS: Substance abuse   PATIENT STRENGTHS: Ability for insight Average or above average intelligence Motivation for treatment/growth   PATIENT IDENTIFIED PROBLEMS: "I don't have anywhere to go"  "my attitude"                   DISCHARGE CRITERIA:  Ability to meet basic life and health needs Adequate post-discharge living arrangements Improved stabilization in mood, thinking, and/or behavior  PRELIMINARY DISCHARGE PLAN: Outpatient therapy Placement in alternative living arrangements  PATIENT/FAMILY INVOLVEMENT: This treatment plan has been presented to and reviewed with the patient, Jon Castro. The patient and family have been given the opportunity to ask questions and make suggestions.  Megan Mans, RN 02/07/2019, 1:58 PM

## 2019-02-07 NOTE — H&P (Addendum)
Psychiatric Admission Assessment Adult  Patient Identification: Jon MadeiraKevin Castro MRN:  [id number] Date of Evaluation:  02/07/2019 Chief Complaint: " I need help for Drug Abuse "  Principal Diagnosis:  Opiate Use Disorder , Opiate Induced Mood Disorder, Depressed, Opiate WDL Diagnosis: Opiate Use Disorder , Opiate Induced Mood Disorder, Depressed, Opiate WDL History of Present Illness: Patient is a 33 year old male, presented to the ED requesting help with substance abuse, detox/ reporting history of opiate dependence. Reports long history of heroin use, and states has been using daily ( IV) up to day prior to admission. On admission also reported depression, suicidal ideations of walking into traffic, but currently denies suicidal ideations. States " I just got tired of using ".  He does endorse some recent depression, and presents vaguely constricted and irritable in affect . Describes some neuro-vegetative symptoms as below, but currently denies changes in sleep, appetite or energy level. Denies psychotic symptoms. Admission BAL negative, admission UDS (+) for opiates, cocaine , cannabis .  Patient presents calm, in no acute distress at this time. Vitals are stable, BP 119/79, pulse 63. He does endorse symptoms of opiate withdrawal, including some cramping , loose stools, aches . No vomiting at this time.  Associated Signs/Symptoms: Depression Symptoms:  depressed mood, anhedonia, suicidal thoughts with specific plan, (Hypo) Manic Symptoms:  None noted or reported, presents vaguely irritable Anxiety Symptoms:  Reports some increased anxiety recently, and states he has had recent panic symptoms Psychotic Symptoms: denies  PTSD Symptoms: Denies  Total Time spent with patient: 45 minutes  Past Psychiatric History:  No prior psychiatric admissions . Denies history of suicide attempts, denies history of self injurious behaviors . Denies history of psychosis. Describes history of depression, which  he describes as substance induced, with improving mood during periods of sobriety. Denies history of mania.  Denies history of violence .  Denies panic or agoraphobia, but does endorse some increased anxiety symptoms recently.  Is the patient at risk to self? Yes.    Has the patient been a risk to self in the past 6 months? No.  Has the patient been a risk to self within the distant past? No.  Is the patient a risk to others? No.  Has the patient been a risk to others in the past 6 months? No.  Has the patient been a risk to others within the distant past? No.   Prior Inpatient Therapy:  denies  Prior Outpatient Therapy:  no   Alcohol Screening: 1. How often do you have a drink containing alcohol?: Never 2. How many drinks containing alcohol do you have on a typical day when you are drinking?: 1 or 2 3. How often do you have six or more drinks on one occasion?: Never AUDIT-C Score: 0 4. How often during the last year have you found that you were not able to stop drinking once you had started?: Never 5. How often during the last year have you failed to do what was normally expected from you becasue of drinking?: Never 6. How often during the last year have you needed a first drink in the morning to get yourself going after a heavy drinking session?: Never 7. How often during the last year have you had a feeling of guilt of remorse after drinking?: Never 8. How often during the last year have you been unable to remember what happened the night before because you had been drinking?: Never 9. Have you or someone else been injured as a  result of your drinking?: No 10. Has a relative or friend or a doctor or another health worker been concerned about your drinking or suggested you cut down?: No Alcohol Use Disorder Identification Test Final Score (AUDIT): 0 Alcohol Brief Interventions/Follow-up: Continued Monitoring Substance Abuse History in the last 12 months:  Denies alcohol abuse. Reports  history of opiate dependence and reports regular, daily IVDA ( heroin) . Denies other drug abuse . He reports history of two year episode of sobriety in the past.  He reports he was on methadone maintenance in the past, but states " it did not help me stay sober" Consequences of Substance Abuse: Denies history of accidental overdoses, reports history of skin abscess in the context of IV use in the past . Reports strained /lost relationships related to substance use .   Previous Psychotropic Medications: reports he was not taking any psychiatric medications prior to admission. Has not been on psychiatric medications in the past . Psychological Evaluations:  No  Past Medical History: denies medical illnesses. NKDA. States does not tolerate codeine or tramadol well.  Past Medical History:  Diagnosis Date  . Back pain    History reviewed. No pertinent surgical history. Family History: Parents alive , live together, has one brother and one sister . Family Psychiatric  History: denies history of mental illness in family, no suicides in family, father has history of alcohol use disorder Tobacco Screening: smokes 1/2 PPD Social History: 37. Single, one 73 year old child, who is living with the mother. Currently unemployed. Had been staying in a hotel prior to admission. Denies legal issues .  Social History   Substance and Sexual Activity  Alcohol Use No     Social History   Substance and Sexual Activity  Drug Use Yes  . Types: Cocaine, Marijuana, Heroin    Additional Social History:  Allergies:   Allergies  Allergen Reactions  . Vicodin [Hydrocodone-Acetaminophen] Itching  . Codeine Nausea And Vomiting  . Tramadol Other (See Comments)    "messes with head"   Lab Results:  Results for orders placed or performed during the hospital encounter of 02/06/19 (from the past 48 hour(s))  Rapid urine drug screen (hospital performed)     Status: Abnormal   Collection Time: 02/06/19  8:51 PM   Result Value Ref Range   Opiates POSITIVE (A) NONE DETECTED   Cocaine POSITIVE (A) NONE DETECTED   Benzodiazepines NONE DETECTED NONE DETECTED   Amphetamines NONE DETECTED NONE DETECTED   Tetrahydrocannabinol POSITIVE (A) NONE DETECTED   Barbiturates NONE DETECTED NONE DETECTED    Comment: (NOTE) DRUG SCREEN FOR MEDICAL PURPOSES ONLY.  IF CONFIRMATION IS NEEDED FOR ANY PURPOSE, NOTIFY LAB WITHIN 5 DAYS. LOWEST DETECTABLE LIMITS FOR URINE DRUG SCREEN Drug Class                     Cutoff (ng/mL) Amphetamine and metabolites    1000 Barbiturate and metabolites    200 Benzodiazepine                 200 Tricyclics and metabolites     300 Opiates and metabolites        300 Cocaine and metabolites        300 THC                            50 Performed at [facility], [address], [place], [place] [address]  Comprehensive metabolic panel     Status: Abnormal   Collection Time: 02/06/19  8:59 PM  Result Value Ref Range   Sodium 138 135 - 145 mmol/L   Potassium 3.2 (L) 3.5 - 5.1 mmol/L   Chloride 107 98 - 111 mmol/L   CO2 23 22 - 32 mmol/L   Glucose, Bld 103 (H) 70 - 99 mg/dL   BUN 8 6 - 20 mg/dL   Creatinine, Ser 0.90 0.61 - 1.24 mg/dL   Calcium 8.7 (L) 8.9 - 10.3 mg/dL   Total Protein 7.4 6.5 - 8.1 g/dL   Albumin 3.3 (L) 3.5 - 5.0 g/dL   AST 19 15 - 41 U/L   ALT 22 0 - 44 U/L   Alkaline Phosphatase 68 38 - 126 U/L   Total Bilirubin 0.6 0.3 - 1.2 mg/dL   GFR calc non Af Amer >60 >60 mL/min   GFR calc Af Amer >60 >60 mL/min   Anion gap 8 5 - 15    Comment: Performed at [facility], [place] [address], [place], [place] [address]  Ethanol     Status: None   Collection Time: 02/06/19  8:59 PM  Result Value Ref Range   Alcohol, Ethyl (B) <10 <10 mg/dL    Comment: (NOTE) Lowest detectable limit for serum alcohol is 10 mg/dL. For medical purposes only. Performed at [facility], [place] [address], [place], [place] [address]   Salicylate level     Status:  None   Collection Time: 02/06/19  8:59 PM  Result Value Ref Range   Salicylate Lvl <8.8 2.8 - 30.0 mg/dL    Comment: Performed at [facility] [address], [place], [place] [address]  Acetaminophen level     Status: None   Collection Time: 02/06/19  8:59 PM  Result Value Ref Range   Acetaminophen (Tylenol), Serum 22 10 - 30 ug/mL    Comment: (NOTE) Therapeutic concentrations vary significantly. A range of 10-30 ug/mL  may be an effective concentration for many patients. However, some  are best treated at concentrations outside of this range. Acetaminophen concentrations >150 ug/mL at 4 hours after ingestion  and >50 ug/mL at 12 hours after ingestion are often associated with  toxic reactions. Performed at [facility], [place] [address], [place], [place] [address]   cbc     Status: Abnormal   Collection Time: 02/06/19  8:59 PM  Result Value Ref Range   WBC 12.9 (H) 4.0 - 10.5 K/uL   RBC 4.55 4.22 - 5.81 MIL/uL   Hemoglobin 12.8 (L) 13.0 - 17.0 g/dL   HCT 39.1 39.0 - 52.0 %   MCV 85.9 80.0 - 100.0 fL   MCH 28.1 26.0 - 34.0 pg   MCHC 32.7 30.0 - 36.0 g/dL   RDW 13.2 11.5 - 15.5 %   Platelets 349 150 - 400 K/uL   nRBC 0.0 0.0 - 0.2 %    Comment: Performed at [facility], [place] [address] [place], [place] [address]  Acetaminophen level     Status: Abnormal   Collection Time: 02/07/19  5:07 AM  Result Value Ref Range   Acetaminophen (Tylenol), Serum <10 (L) 10 - 30 ug/mL    Comment: (NOTE) Therapeutic concentrations vary significantly. A range of 10-30 ug/mL  may be an effective concentration for many patients. However, some  are best treated at concentrations outside of this range. Acetaminophen concentrations >150 ug/mL at 4 hours after ingestion  and >50 ug/mL at 12 hours after ingestion are often associated with  toxic reactions. Performed at [facility], [address] [place], [place] [id number]     Blood Alcohol level:  Lab Results   Component Value Date   ETH <10 02/06/2019   ETH <5 01/21/2015    Metabolic Disorder Labs:  No results found for: HGBA1C, MPG No results found for: PROLACTIN No results found for: CHOL, TRIG, HDL, CHOLHDL, VLDL, LDLCALC  Current Medications: No current facility-administered medications for this encounter.    PTA Medications: Medications Prior to Admission  Medication Sig Dispense Refill Last Dose  . clindamycin (CLEOCIN) 150 MG capsule Take 2 capsules (300 mg total) by mouth every 6 (six) hours. (Patient not taking: Reported on 02/07/2019) 56 capsule 0     Musculoskeletal: Strength & Muscle Tone: within normal limits Gait & Station: normal Patient leans: N/A  Psychiatric Specialty Exam: Physical Exam  Review of Systems  Constitutional: Positive for chills.  HENT: Negative.   Eyes: Negative.   Respiratory: Negative for cough and shortness of breath.   Cardiovascular: Negative for chest pain.  Gastrointestinal: Positive for abdominal pain and diarrhea.       Abdominal cramps  Genitourinary: Negative.   Musculoskeletal: Negative for myalgias.  Skin: Negative for rash.  Neurological: Negative for seizures and headaches.  Endo/Heme/Allergies: Negative.   Psychiatric/Behavioral: Positive for depression and substance abuse.  All other systems reviewed and are negative.   Blood pressure 119/79, pulse 63, temperature 98.2 F (36.8 C), temperature source Oral, resp. rate 16, height 6\' 1"  (1.854 m), weight 73.5 kg, SpO2 100 %.Body mass index is 21.37 kg/m.  General Appearance: Fairly Groomed  Eye Contact:  Fair  Speech:  Normal Rate  Volume:  Decreased  Mood:  Dysphoric  Affect:  Congruent and irritable  Thought Process:  Linear and Descriptions of Associations: Intact  Orientation:  Full (Time, Place, and Person)  Thought Content:  no hallucinations , no delusions, not internally preoccupied  Suicidal Thoughts:  No currently denies suicidal or self injurious ideations and  contracts for safety on unit, denies homicidal or violent ideations  Homicidal Thoughts:  No  Memory:  recent and remote grossly intact   Judgement:  Fair  Insight:  Fair  Psychomotor Activity:  no psychomotor agitation or restlessness   Concentration:  Concentration: Good and Attention Span: Good  Recall:  Good  Fund of Knowledge:  Good  Language:  Good  Akathisia:  Negative  Handed:  Right  AIMS (if indicated):     Assets:  Desire for Improvement Resilience  ADL's:  Intact  Cognition:  WNL  Sleep:       Treatment Plan Summary: Daily contact with patient to assess and evaluate symptoms and progress in treatment, Medication management, Plan inpatient treatment  and medications as below  Observation Level/Precautions:  15 minute checks  Laboratory:  Based on history of IVDA he expresses interest in checking  Hep B, C, HIV   Psychotherapy:  Milieu, group therapy   Medications:  Start clonidine detox protocol to address opiate WDL. We reviewed option of  starting an antidepressant medication but as he reports mood tends to improve with sobriety will monitor mood further at this time. KDUR 10 mEQ x 3 doses for hypokalemia   Consultations:  As needed   Discharge Concerns:  Homelessness, currently expressing some interest in going to a rehab after discharge  Estimated LOS: 4-5 days   Other:     Physician  Treatment Plan for Primary Diagnosis:  Opiate Dependence Long Term Goal(s): Improvement in symptoms so as ready for discharge  Short Term Goals: Ability to identify changes in lifestyle to reduce recurrence of condition will improve and Ability to identify triggers associated with substance abuse/mental health issues will improve  Physician Treatment Plan for Secondary Diagnosis: Opiate Induced Mood Disorder, Depressed  Long Term Goal(s): Improvement in symptoms so as ready for discharge  Short Term Goals: Ability to identify changes in lifestyle to reduce recurrence of condition  will improve, Ability to verbalize feelings will improve, Ability to disclose and discuss suicidal ideas, Ability to demonstrate self-control will improve, Ability to identify and develop effective coping behaviors will improve, Ability to maintain clinical measurements within normal limits will improve and Compliance with prescribed medications will improve  I certify that inpatient services furnished can reasonably be expected to improve the patient's condition.    Craige CottaFernando A Johann Santone, MD 6/11/20205:23 PM

## 2019-02-07 NOTE — ED Provider Notes (Signed)
TIME SEEN: 4:26 AM  CHIEF COMPLAINT: Suicidal, heroin abuse  HPI: Patient is a 33 year old male with history of IV heroin abuse who presents to the emergency department feeling suicidal for the past couple of days with a plan to walk out into traffic.  No HI or hallucinations.  Last used IV heroin yesterday.  No other drug or alcohol use.  Denies fevers, chills, cough, chest pain, shortness of breath, vomiting, diarrhea.  ROS: See HPI Constitutional: no fever  Eyes: no drainage  ENT: no runny nose   Cardiovascular:  no chest pain  Resp: no SOB  GI: no vomiting GU: no dysuria Integumentary: no rash  Allergy: no hives  Musculoskeletal: no leg swelling  Neurological: no slurred speech ROS otherwise negative  PAST MEDICAL HISTORY/PAST SURGICAL HISTORY:  Past Medical History:  Diagnosis Date  . Back pain     MEDICATIONS:  Prior to Admission medications   Medication Sig Start Date End Date Taking? Authorizing Provider  clindamycin (CLEOCIN) 150 MG capsule Take 2 capsules (300 mg total) by mouth every 6 (six) hours. Patient not taking: Reported on 02/07/2019 03/18/17   Carlisle Cater, PA-C    ALLERGIES:  Allergies  Allergen Reactions  . Vicodin [Hydrocodone-Acetaminophen] Itching  . Codeine Nausea And Vomiting  . Tramadol Other (See Comments)    "messes with head"    SOCIAL HISTORY:  Social History   Tobacco Use  . Smoking status: Current Some Day Smoker  . Smokeless tobacco: Never Used  Substance Use Topics  . Alcohol use: No    FAMILY HISTORY: No family history on file.  EXAM: BP 133/77 (BP Location: Right Arm)   Pulse 89   Temp 98.1 F (36.7 C) (Oral)   Resp 19   SpO2 100%  CONSTITUTIONAL: Alert and oriented and responds appropriately to questions.  Chronically ill-appearing, thin HEAD: Normocephalic EYES: Conjunctivae clear, pupils appear equal, EOMI ENT: normal nose; moist mucous membranes NECK: Supple, no meningismus, no nuchal rigidity, no LAD  CARD:  RRR; S1 and S2 appreciated; no murmurs, no clicks, no rubs, no gallops RESP: Normal chest excursion without splinting or tachypnea; breath sounds clear and equal bilaterally; no wheezes, no rhonchi, no rales, no hypoxia or respiratory distress, speaking full sentences ABD/GI: Normal bowel sounds; non-distended; soft, non-tender, no rebound, no guarding, no peritoneal signs, no hepatosplenomegaly BACK:  The back appears normal and is non-tender to palpation, there is no CVA tenderness EXT: Normal ROM in all joints; non-tender to palpation; no edema; normal capillary refill; no cyanosis, no calf tenderness or swelling    SKIN: Normal color for age and race; warm; no rash NEURO: Moves all extremities equally, patient is not tremulous, normal speech PSYCH: Endorses suicidal ideation with plan to walk into traffic.  No HI or hallucinations.  No signs or symptoms of withdrawal.  MEDICAL DECISION MAKING: Patient here with SI with plans to walk into traffic.  Rating labs unremarkable other than mild leukocytosis without infectious symptoms.  This may be reactive in nature.  Acetaminophen level was 22 at 9 PM.  We will repeat this to ensure that is going down.  Drug screen positive for opiates, cocaine and marijuana.  TTS consult has been ordered by triage nursing staff.  ED PROGRESS: Patient's repeat Tylenol level is negative.  He is medically cleared.  TTS recommends inpatient treatment.  He is here voluntarily.  I reviewed all nursing notes, vitals, pertinent previous records, EKGs, lab and urine results, imaging (as available).    Arcadia Gorgas, Cyril Mourning  N, DO 02/07/19 [id number]

## 2019-02-07 NOTE — ED Notes (Signed)
TTS in process 

## 2019-02-07 NOTE — BH Assessment (Signed)
Tele Assessment Note   Patient Name: Jon MadeiraKevin Barocio MRN: [id number] Referring Physician: Dr. Elesa MassedWard Location of Patient: [facility] Location of Provider: Behavioral Health TTS Department  Jon Castro is an 33 y.o. male.  -Clinician reviewed note by Dr. Rochele RaringKristen Ward.  Patient is a 33 year old male with history of IV heroin abuse who presents to the emergency department feeling suicidal for the past couple of days with a plan to walk out into traffic.  No HI or hallucinations.  Last used IV heroin yesterday.  No other drug or alcohol use.  Patient is resting when clinician called in.  Patient said that he had contacted [facility] today.  They recommended he come to the hospital for evaluation.  He got his grandfather to bring him.  Patient says he has had depression due to his drug use.  He said he is at the end of his rope.  He has been thinking of walking into traffic for the last two days as a way to kill himself.  Patient has no previous suicide attempts.  Patient has no HI or A/V hallucinations.  Patient is positive for cocaine and surmises it may have been in his heroin dose.  He has been shooting up a half to a full gram of heroin daily for the last two months.  Patient last used around 19:30 on 06/10.  He uses marijuana once in awhile and reports that he does not drink.  Patient is homeless.  He said he has no real family support.  He has a flat affect and does not elaborate on answers.    Patient has no outpatient care.  [facility] [place] [facility].  He had methadone tx from [facility] in 2016.  -Clinician discussed patient care with Nira ConnJason Berry, FNP.  He recommends inpatient psychiatric care.  Clinician contacted RN Gabriel RungMonique to let her know that a COVID 19 test was needed for patient.  [facility]  TTS to seek placement.  Diagnosis: F32.2 MDD single episode severe; F11.20 Opioid use d/o severe  Past  Medical History:  Past Medical History:  Diagnosis Date  . Back pain     History reviewed. No pertinent surgical history.  Family History: No family history on file.  Social History:  reports that he has been smoking. He has never used smokeless tobacco. He reports current drug use. Drugs: Cocaine and Marijuana. He reports that he does not drink alcohol.  Additional Social History:  Alcohol / Drug Use Pain Medications: Abusing pain medications. Prescriptions: None Over the Counter: None History of alcohol / drug use?: Yes Withdrawal Symptoms: Patient aware of relationship between substance abuse and physical/medical complications, Diarrhea, Weakness, Nausea / Vomiting, Sweats, Fever / Chills Substance #1 Name of Substance 1: Heroin (IV) 1 - Age of First Use: 33 years of age 31 - Amount (size/oz): 1/2 to a full gram per day 1 - Frequency: Daily use 1 - Duration: Past two months 1 - Last Use / Amount: 06/10 One gram around 19:30 Substance #2 Name of Substance 2: Marijuana 2 - Age of First Use: 33 years of age 42 - Amount (size/oz): Varies 2 - Frequency: Around 3 times in a month 2 - Duration: ongoing 2 - Last Use / Amount: 02/01/19  CIWA: CIWA-Ar BP: 133/77 Pulse Rate: 89 COWS:    Allergies:  Allergies  Allergen Reactions  . Vicodin [Hydrocodone-Acetaminophen] Itching  . Codeine Nausea And Vomiting  .  Tramadol Other (See Comments)    "messes with head"    Home Medications: (Not in a hospital admission)   OB/GYN Status:  No LMP for male patient.  General Assessment Data [facility] Assessment: In system Is this a Tele or Face-to-Face Assessment?: Tele Assessment Is this an Initial Assessment or a Re-assessment for this encounter?: Initial Assessment Patient Accompanied by:: N/A Language Other than English: No Living Arrangements: Homeless/Shelter What gender do you identify as?: Male Marital status: Single Pregnancy Status:  Unknown Living Arrangements: Other (Comment)(Pt is homeless) Can pt return to current living arrangement?: Yes Admission Status: Voluntary Is patient capable of signing voluntary admission?: Yes Referral Source: Self/Family/Friend([facility] Insurance type: self pay     Crisis Care Plan Living Arrangements: Other (Comment)(Pt is homeless) Name of Psychiatrist: None Name of Therapist: None  Education Status Is patient currently in school?: No Is the patient employed, unemployed or receiving disability?: Unemployed  Risk to self with the past 6 months Suicidal Ideation: Yes-Currently Present Has patient been a risk to self within the past 6 months prior to admission? : No Suicidal Intent: No Has patient had any suicidal intent within the past 6 months prior to admission? : No Is patient at risk for suicide?: Yes Suicidal Plan?: Yes-Currently Present Has patient had any suicidal plan within the past 6 months prior to admission? : No Specify Current Suicidal Plan: Thoughts of walking into traffic Access to Means: Yes Specify Access to Suicidal Means: Traffic What has been your use of drugs/alcohol within the last 12 months?: THC, Heroin Previous Attempts/Gestures: No How many times?: 0 Other Self Harm Risks: SA use Triggers for Past Attempts: None known Intentional Self Injurious Behavior: None Family Suicide History: Unknown Recent stressful life event(s): Financial Problems, Other (Comment)(Drug use and homelessness) Persecutory voices/beliefs?: No Depression: Yes Depression Symptoms: Despondent, Isolating, Guilt, Loss of interest in usual pleasures, Feeling worthless/self pity Substance abuse history and/or treatment for substance abuse?: Yes Suicide prevention information given to non-admitted patients: Not applicable  Risk to Others within the past 6 months Homicidal Ideation: No Does patient have any lifetime risk of violence  toward others beyond the six months prior to admission? : No Thoughts of Harm to Others: No Current Homicidal Intent: No Current Homicidal Plan: No Access to Homicidal Means: No Identified Victim: No one History of harm to others?: No Assessment of Violence: None Noted Violent Behavior Description: None reported Does patient have access to weapons?: No Criminal Charges Pending?: No Does patient have a court date: No Is patient on probation?: No  Psychosis Hallucinations: None noted Delusions: None noted  Mental Status Report Appearance/Hygiene: Disheveled, Body odor, In scrubs Eye Contact: Fair Motor Activity: Freedom of movement, Unremarkable Speech: Logical/coherent Level of Consciousness: Quiet/awake Mood: Depressed, Despair, Sad Affect: Depressed, Blunted Anxiety Level: Moderate Thought Processes: Coherent, Relevant Judgement: Impaired Orientation: Person, Place, Situation, Time Obsessive Compulsive Thoughts/Behaviors: None  Cognitive Functioning Concentration: Normal Memory: Remote Intact, Recent Intact Is patient IDD: No Insight: Fair Impulse Control: Poor Appetite: Good Have you had any weight changes? : No Change Sleep: No Change Total Hours of Sleep: (Depends on drug use) Vegetative Symptoms: None  ADLScreening [facility] Assessment Services) Patient's cognitive ability adequate to safely complete daily activities?: Yes Patient able to express need for assistance with ADLs?: Yes Independently performs ADLs?: Yes (appropriate for developmental age)  Prior Inpatient Therapy Prior Inpatient Therapy: Yes Prior Therapy Dates: 2018 [facility]  Reason for Treatment: rehab  Prior Outpatient Therapy Prior Outpatient Therapy: Yes Prior Therapy Dates: 2016 Prior Therapy Facilty/Provider(s): [facility] Reason for Treatment: Methadone management Does patient have an ACCT team?: No Does patient have Intensive In-House  Services?  : No Does patient have [facility] services? : No Does patient have P4CC services?: No  ADL Screening (condition at time of admission) Patient's cognitive ability adequate to safely complete daily activities?: Yes Is the patient deaf or have difficulty hearing?: No Does the patient have difficulty seeing, even when wearing glasses/contacts?: No Does the patient have difficulty concentrating, remembering, or making decisions?: No Patient able to express need for assistance with ADLs?: Yes Does the patient have difficulty dressing or bathing?: No Independently performs ADLs?: Yes (appropriate for developmental age) Does the patient have difficulty walking or climbing stairs?: No Weakness of Legs: None Weakness of Arms/Hands: None       Abuse/Neglect Assessment (Assessment to be complete while patient is alone) Abuse/Neglect Assessment Can Be Completed: Yes Physical Abuse: Denies Verbal Abuse: Denies Sexual Abuse: Denies Exploitation of patient/patient's resources: Denies Self-Neglect: Denies     Regulatory affairs officer (For Healthcare) Does Patient Have a Medical Advance Directive?: No Would patient like information on creating a medical advance directive?: No - Patient declined          Disposition:  Disposition Initial Assessment Completed for this Encounter: Yes Patient referred to: Other (Comment)(Pt to be referred out)  This service was provided via telemedicine using a 2-way, interactive audio and Radiographer, therapeutic.  Names of all persons participating in this telemedicine service and their role in this encounter. Name: Jon Castro Role: patient  Name: Curlene Dolphin, M.S. LCAS QP Role: clinician  Name:  Role:   Name:  Role:     Raymondo Band 02/07/2019 5:14 AM

## 2019-02-07 NOTE — ED Notes (Signed)
Patient was given a snack and drink. 

## 2019-02-07 NOTE — Progress Notes (Signed)
Pt accepted to [facility], Bed 306-2 Lindon Romp, NP is the accepting provider.  Neita Garnet, MD is the attending provider.  Call report to [phone]  [facility] ED notified.   Pt is Voluntary.  Pt may be transported by [facility]  Pt scheduled  to arrive at [facility]@12 :00 [facility] [place]. Jorja Loa, MSW, [facility] Disposition Clinical Social Work [phone] (cell) [phone] (office)

## 2019-02-07 NOTE — ED Notes (Signed)
ED Provider at bedside. 

## 2019-02-07 NOTE — Progress Notes (Signed)
Patient is a 33 year old male with history of IV heroin abuse who presents to the emergency department feeling suicidal for the past couple of days with a plan to walk out into traffic. No HI or hallucinations. Last used IV heroin yesterday. No other drug or alcohol use. Per counselor. Patient currently denies SI HI AVH. Is stressed because of housing and has no social support. Does not currently have any prior to admission medications.  Tattoos on right and left shoulder and right upper chest. No contraband found.

## 2019-02-07 NOTE — ED Notes (Signed)
PT aysmptomatic for COVID so does not need swab per [facility]

## 2019-02-08 DIAGNOSIS — F322 Major depressive disorder, single episode, severe without psychotic features: Principal | ICD-10-CM

## 2019-02-08 DIAGNOSIS — G4700 Insomnia, unspecified: Secondary | ICD-10-CM

## 2019-02-08 DIAGNOSIS — F419 Anxiety disorder, unspecified: Secondary | ICD-10-CM

## 2019-02-08 DIAGNOSIS — F1123 Opioid dependence with withdrawal: Secondary | ICD-10-CM

## 2019-02-08 LAB — TSH: TSH: 0.296 u[IU]/mL — ABNORMAL LOW (ref 0.350–4.500)

## 2019-02-08 LAB — HIV ANTIBODY (ROUTINE TESTING W REFLEX): HIV Screen 4th Generation wRfx: NONREACTIVE

## 2019-02-08 NOTE — Progress Notes (Signed)
D Pt is seen [facility] on the 300 hall today. HE tolerates this moderately well. He wears his own street clothes , he is clean, he endorses a flat, depressed affect.      A He completed his daily assessment and on this he wrote he denied having SI today and he rated his depression, hopelessness and anxiety " 3/0/2", respectively.     R Safety in place.

## 2019-02-08 NOTE — Progress Notes (Signed)
Patient rated his day as a 8 out of a possible 10. He attributed his good day to being able to speak with his son over the telephone. His goal for tomorrow is to speak with additional family members.

## 2019-02-08 NOTE — Progress Notes (Addendum)
[facility]  02/08/2019 12:11 PM Janene MadeiraKevin Batts  MRN:  [id number] Subjective:  "I'm fine."  [name] found lying in bed. He reports adequate sleep last night but appears fatigued with minimal speech. Clonidine protocol was started yesterday for daily IV heroin use. Reports withdrawal symptoms- indigestion, cramping, general malaise and aches. Labs were drawn this morning including hep B/C pending. Denies depressed mood. Denies SI/HI/AVH. He is hoping for inpatient rehab after discharge. Patient has been staying in his room in bed. No agitated or disruptive behaviors on the unit.  From admission H&P: Patient is a 33 year old male, presented to the ED requesting help with substance abuse, detox/ reporting history of opiate dependence. Reports long history of heroin use, and states has been using daily ( IV) up to day prior to admission.  Principal Problem: <principal problem not specified> Diagnosis: Active Problems:   MDD (major depressive disorder), severe (HCC)  Total Time spent with patient: 15 minutes  Past Psychiatric History: See admission H&P  Past Medical History:  Past Medical History:  Diagnosis Date  . Back pain    History reviewed. No pertinent surgical history. Family History: History reviewed. No pertinent family history. Family Psychiatric  History: See admission H&P Social History:  Social History   Substance and Sexual Activity  Alcohol Use No     Social History   Substance and Sexual Activity  Drug Use Yes  . Types: Cocaine, Marijuana, Heroin    Social History   Socioeconomic History  . Marital status: Single    Spouse name: Not on file  . Number of children: Not on file  . Years of education: Not on file  . Highest education level: Not on file  Occupational History  . Not on file  Social Needs  . Financial resource strain: Not on file  . Food insecurity    Worry: Not on file    Inability: Not on file  . Transportation needs   Medical: Not on file    Non-medical: Not on file  Tobacco Use  . Smoking status: Current Some Day Smoker  . Smokeless tobacco: Never Used  Substance and Sexual Activity  . Alcohol use: No  . Drug use: Yes    Types: Cocaine, Marijuana, Heroin  . Sexual activity: Not on file  Lifestyle  . Physical activity    Days per week: Not on file    Minutes per session: Not on file  . Stress: Not on file  Relationships  . Social Musicianconnections    Talks on phone: Not on file    Gets together: Not on file    Attends religious service: Not on file    Active member of club or organization: Not on file    Attends meetings of clubs or organizations: Not on file    Relationship status: Not on file  Other Topics Concern  . Not on file  Social History Narrative  . Not on file   Additional Social History:                         Sleep: Fair  Appetite:  Fair  Current Medications: Current Facility-Administered Medications  Medication Dose Route Frequency Provider Last Rate Last Dose  . alum & mag hydroxide-simeth (MAALOX/MYLANTA) 200-200-20 MG/5ML suspension 30 mL  30 mL Oral Q4H PRN Money, Feliz Beamravis B, FNP      . cloNIDine (CATAPRES) tablet 0.1 mg  0.1 mg Oral QID , Rockey SituFernando A,  MD   0.1 mg at 02/08/19 1202   Followed by  . [START ON 02/09/2019] cloNIDine (CATAPRES) tablet 0.1 mg  0.1 mg Oral BH-qamhs , Rockey Situ A, MD       Followed by  . [START ON 02/12/2019] cloNIDine (CATAPRES) tablet 0.1 mg  0.1 mg Oral QAC breakfast ,  A, MD      . dicyclomine (BENTYL) tablet 20 mg  20 mg Oral Q6H PRN , Rockey Situ A, MD   20 mg at 02/07/19 2116  . hydrOXYzine (ATARAX/VISTARIL) tablet 25 mg  25 mg Oral Q6H PRN , Rockey Situ A, MD   25 mg at 02/07/19 2116  . loperamide (IMODIUM) capsule 2-4 mg  2-4 mg Oral PRN , Rockey Situ A, MD   2 mg at 02/07/19 2116  . magnesium hydroxide (MILK OF MAGNESIA) suspension 30 mL  30 mL Oral Daily PRN Money, Gerlene Burdockravis B, FNP      . methocarbamol  (ROBAXIN) tablet 500 mg  500 mg Oral Q8H PRN , Rockey Situ A, MD   500 mg at 02/07/19 2116  . naproxen (NAPROSYN) tablet 500 mg  500 mg Oral BID PRN , Rockey Situ A, MD   500 mg at 02/07/19 2116  . nicotine (NICODERM CQ - dosed in mg/24 hours) patch 21 mg  21 mg Transdermal Daily , Rockey Situ A, MD   21 mg at 02/08/19 0858  . ondansetron (ZOFRAN-ODT) disintegrating tablet 4 mg  4 mg Oral Q6H PRN , Rockey Situ A, MD   4 mg at 02/07/19 2115  . potassium chloride (K-DUR) CR tablet 10 mEq  10 mEq Oral BID , Rockey Situ A, MD   10 mEq at 02/08/19 0858  . traZODone (DESYREL) tablet 50 mg  50 mg Oral QHS PRN Money, Gerlene Burdockravis B, FNP   50 mg at 02/07/19 2116    Lab Results:  Results for orders placed or performed during the hospital encounter of 02/07/19 (from the past 48 hour(s))  TSH     Status: Abnormal   Collection Time: 02/08/19  7:05 AM  Result Value Ref Range   TSH 0.296 (L) 0.350 - 4.500 uIU/mL    Comment: Performed by a 3rd Generation assay with a functional sensitivity of <=0.01 uIU/mL. Performed at [facility], [address] [place], [place] [address]     Blood Alcohol level:  Lab Results  Component Value Date   ETH <10 02/06/2019   ETH <5 01/21/2015    Metabolic Disorder Labs: No results found for: HGBA1C, MPG No results found for: PROLACTIN No results found for: CHOL, TRIG, HDL, CHOLHDL, VLDL, LDLCALC  Physical Findings: AIMS:  , ,  ,  ,    CIWA:    COWS:  COWS Total Score: 0  Musculoskeletal: Strength & Muscle Tone: within normal limits Gait & Station: normal Patient leans: N/A  Psychiatric Specialty Exam: Physical Exam  Nursing note and vitals reviewed. Constitutional: He is oriented to person, place, and time. He appears well-developed and well-nourished.  Cardiovascular: Normal rate.  Respiratory: Effort normal.  Neurological: He is alert and oriented to person, place, and time.    Review of Systems  Constitutional: Positive for  malaise/fatigue. Negative for chills and fever.  Respiratory: Negative for cough and shortness of breath.   Cardiovascular: Negative for chest pain.  Gastrointestinal: Positive for abdominal pain and heartburn. Negative for diarrhea and vomiting.  Musculoskeletal: Positive for myalgias.  Neurological: Positive for headaches.  Psychiatric/Behavioral: Positive for substance abuse. Negative for depression, hallucinations and suicidal ideas. The  patient is not nervous/anxious and does not have insomnia.     Blood pressure 118/80, pulse 65, temperature 98.5 F (36.9 C), resp. rate 20, height 6\' 1"  (1.854 m), weight 73.5 kg, SpO2 100 %.Body mass index is 21.37 kg/m.  General Appearance: Disheveled  Eye Contact:  Fair  Speech:  Slow  Volume:  Decreased  Mood:  Irritable  Affect:  Congruent  Thought Process:  Coherent  Orientation:  Full (Time, Place, and Person)  Thought Content:  Logical  Suicidal Thoughts:  No  Homicidal Thoughts:  No  Memory:  Immediate;   Fair Recent;   Fair  Judgement:  Fair  Insight:  Fair  Psychomotor Activity:  Decreased  Concentration:  Concentration: Fair  Recall:  [employer] of Knowledge:  Fair  Language:  Good  Akathisia:  No  Handed:  Right  AIMS (if indicated):     Assets:  Communication Skills Desire for Improvement Resilience  ADL's:  Intact  Cognition:  WNL  Sleep:  Number of Hours: 5.5     Treatment Plan Summary: Daily contact with patient to assess and evaluate symptoms and progress in treatment and Medication management   Continue inpatient hospitalization.  Continue clonidine COWS protocol for opioid withdrawal Continue Vistaril 25 mg PO Q6HR PRN anxiety Continue trazodone 50 mg PO QHS PRN insomnia  Patient will participate in the therapeutic group milieu.  Discharge disposition in progress.   Connye Burkitt, NP 02/08/2019, 12:11 PM   Agree with NP progress note

## 2019-02-08 NOTE — Progress Notes (Signed)
D: Patient observed resting in bed much of the evening tonight. Patient states, "I don't feel well. I'm detoxing. I feel irritable, agitated. My stomach is cramping, I have diarrhea and I'm nauseated. I have body aches and spasms as well. I also need something for sleep." Patient's affect flat, mood irritable. Generalized pain rated at a 5/10. COVID-19 screen negative, afebrile. Respiratory assessment WDL.  A: Medicated per orders, prn zofran, imodium, bentyl, vistaril, robaxin, naproxen and trazadone given for complaints. Medication education provided. Level III obs in place for safety. Emotional support offered. Patient encouraged to complete Suicide Safety Plan before discharge. Encouraged to attend and participate in unit programming.  Fall prevention plan in place and reviewed with patient as pt is a high fall risk.   R: Patient verbalizes understanding of POC, falls prevention education. On reassess, patient was asleep. Patient denies SI/HI/AVH and remains safe on level III obs. Will continue to monitor throughout the night.

## 2019-02-08 NOTE — Progress Notes (Signed)
[facility] NOVEL CORONAVIRUS (COVID-19) DAILY CHECK-OFF SYMPTOMS - answer yes or no to each - every day NO YES  Have you had a fever in the past 24 hours?  . Fever (Temp > 37.80C / 100F) X   Have you had any of these symptoms in the past 24 hours? . New Cough .  Sore Throat  .  Shortness of Breath .  Difficulty Breathing .  Unexplained Body Aches   X   Have you had any one of these symptoms in the past 24 hours not related to allergies?   . Runny Nose .  Nasal Congestion .  Sneezing   X   If you have had runny nose, nasal congestion, sneezing in the past 24 hours, has it worsened?  X   EXPOSURES - check yes or no X   Have you traveled outside the state in the past 14 days?  X   Have you been in contact with someone with a confirmed diagnosis of COVID-19 or PUI in the past 14 days without wearing appropriate PPE?  X   Have you been living in the same home as a person with confirmed diagnosis of COVID-19 or a PUI (household contact)?    X   Have you been diagnosed with COVID-19?    X              What to do next: Answered NO to all: Answered YES to anything:   Proceed with unit schedule Follow the [facility] Inpatient Flowsheet.   

## 2019-02-08 NOTE — Tx Team (Signed)
Interdisciplinary Treatment and Diagnostic Plan Update  02/08/2019 Time of Session: 9:27am Jon Castro MRN: [id number]  Principal Diagnosis: <principal problem not specified>  Secondary Diagnoses: Active Problems:   MDD (major depressive disorder), severe (HCC)   Current Medications:  Current Facility-Administered Medications  Medication Dose Route Frequency Provider Last Rate Last Dose  . alum & mag hydroxide-simeth (MAALOX/MYLANTA) 200-200-20 MG/5ML suspension 30 mL  30 mL Oral Q4H PRN Money, Darnelle Maffucci B, FNP      . cloNIDine (CATAPRES) tablet 0.1 mg  0.1 mg Oral QID Cobos, Myer Peer, MD   0.1 mg at 02/08/19 0858   Followed by  . [START ON 02/09/2019] cloNIDine (CATAPRES) tablet 0.1 mg  0.1 mg Oral BH-qamhs Cobos, Myer Peer, MD       Followed by  . [START ON 02/12/2019] cloNIDine (CATAPRES) tablet 0.1 mg  0.1 mg Oral QAC breakfast Cobos, Fernando A, MD      . dicyclomine (BENTYL) tablet 20 mg  20 mg Oral Q6H PRN Cobos, Myer Peer, MD   20 mg at 02/07/19 2116  . hydrOXYzine (ATARAX/VISTARIL) tablet 25 mg  25 mg Oral Q6H PRN Cobos, Myer Peer, MD   25 mg at 02/07/19 2116  . loperamide (IMODIUM) capsule 2-4 mg  2-4 mg Oral PRN Cobos, Myer Peer, MD   2 mg at 02/07/19 2116  . magnesium hydroxide (MILK OF MAGNESIA) suspension 30 mL  30 mL Oral Daily PRN Money, Lowry Ram, FNP      . methocarbamol (ROBAXIN) tablet 500 mg  500 mg Oral Q8H PRN Cobos, Myer Peer, MD   500 mg at 02/07/19 2116  . naproxen (NAPROSYN) tablet 500 mg  500 mg Oral BID PRN Cobos, Myer Peer, MD   500 mg at 02/07/19 2116  . nicotine (NICODERM CQ - dosed in mg/24 hours) patch 21 mg  21 mg Transdermal Daily Cobos, Myer Peer, MD   21 mg at 02/08/19 0858  . ondansetron (ZOFRAN-ODT) disintegrating tablet 4 mg  4 mg Oral Q6H PRN Cobos, Myer Peer, MD   4 mg at 02/07/19 2115  . potassium chloride (K-DUR) CR tablet 10 mEq  10 mEq Oral BID Cobos, Myer Peer, MD   10 mEq at 02/08/19 0858  . traZODone (DESYREL) tablet 50 mg  50 mg  Oral QHS PRN Money, Lowry Ram, FNP   50 mg at 02/07/19 2116   PTA Medications: Medications Prior to Admission  Medication Sig Dispense Refill Last Dose  . clindamycin (CLEOCIN) 150 MG capsule Take 2 capsules (300 mg total) by mouth every 6 (six) hours. (Patient not taking: Reported on 02/07/2019) 56 capsule 0     Patient Stressors: Substance abuse  Patient Strengths: Ability for insight Average or above average intelligence Motivation for treatment/growth  Treatment Modalities: Medication Management, Group therapy, Case management,  1 to 1 session with clinician, Psychoeducation, Recreational therapy.   Physician Treatment Plan for Primary Diagnosis: <principal problem not specified> Long Term Goal(s): Improvement in symptoms so as ready for discharge Improvement in symptoms so as ready for discharge   Short Term Goals: Ability to identify changes in lifestyle to reduce recurrence of condition will improve Ability to identify triggers associated with substance abuse/mental health issues will improve Ability to identify changes in lifestyle to reduce recurrence of condition will improve Ability to verbalize feelings will improve Ability to disclose and discuss suicidal ideas Ability to demonstrate self-control will improve Ability to identify and develop effective coping behaviors will improve Ability to maintain clinical measurements within normal limits  will improve Compliance with prescribed medications will improve  Medication Management: Evaluate patient's response, side effects, and tolerance of medication regimen.  Therapeutic Interventions: 1 to 1 sessions, Unit Group sessions and Medication administration.  Evaluation of Outcomes: Not Met  Physician Treatment Plan for Secondary Diagnosis: Active Problems:   MDD (major depressive disorder), severe ([place])  Long Term Goal(s): Improvement in symptoms so as ready for discharge Improvement in symptoms so as ready for  discharge   Short Term Goals: Ability to identify changes in lifestyle to reduce recurrence of condition will improve Ability to identify triggers associated with substance abuse/mental health issues will improve Ability to identify changes in lifestyle to reduce recurrence of condition will improve Ability to verbalize feelings will improve Ability to disclose and discuss suicidal ideas Ability to demonstrate self-control will improve Ability to identify and develop effective coping behaviors will improve Ability to maintain clinical measurements within normal limits will improve Compliance with prescribed medications will improve     Medication Management: Evaluate patient's response, side effects, and tolerance of medication regimen.  Therapeutic Interventions: 1 to 1 sessions, Unit Group sessions and Medication administration.  Evaluation of Outcomes: Not Met   RN Treatment Plan for Primary Diagnosis: <principal problem not specified> Long Term Goal(s): Knowledge of disease and therapeutic regimen to maintain health will improve  Short Term Goals: Ability to participate in decision making will improve, Ability to verbalize feelings will improve, Ability to disclose and discuss suicidal ideas, Ability to identify and develop effective coping behaviors will improve and Compliance with prescribed medications will improve  Medication Management: RN will administer medications as ordered by provider, will assess and evaluate patient's response and provide education to patient for prescribed medication. RN will report any adverse and/or side effects to prescribing provider.  Therapeutic Interventions: 1 on 1 counseling sessions, Psychoeducation, Medication administration, Evaluate responses to treatment, Monitor vital signs and CBGs as ordered, Perform/monitor CIWA, COWS, AIMS and Fall Risk screenings as ordered, Perform wound care treatments as ordered.  Evaluation of Outcomes: Not  Met   LCSW Treatment Plan for Primary Diagnosis: <principal problem not specified> Long Term Goal(s): Safe transition to appropriate next level of care at discharge, Engage patient in therapeutic group addressing interpersonal concerns.  Short Term Goals: Engage patient in aftercare planning with referrals and resources and Increase skills for wellness and recovery  Therapeutic Interventions: Assess for all discharge needs, 1 to 1 time with Social worker, Explore available resources and support systems, Assess for adequacy in community support network, Educate family and significant other(s) on suicide prevention, Complete Psychosocial Assessment, Interpersonal group therapy.  Evaluation of Outcomes: Not Met   Progress in Treatment: Attending groups: No. Participating in groups: No. Taking medication as prescribed: Yes. Toleration medication: Yes. Family/Significant other contact made: No, will contact:  will contact if given  consent to contact Patient understands diagnosis: Yes. Discussing patient identified problems/goals with staff: Yes. Medical problems stabilized or resolved: Yes. Denies suicidal/homicidal ideation: Yes. Issues/concerns per patient self-inventory: No. Other:   New problem(s) identified: No, Describe:  None  New Short Term/Long Term Goal(s):  Patient Goals:  "To get clean"  Discharge Plan or Barriers:   Reason for Continuation of Hospitalization: Aggression Anxiety Medication stabilization Withdrawal symptoms  Estimated Length of Stay: 3-5 days  Attendees: Patient: 02/08/2019   Physician: Dr. Neita Garnet, MD 02/08/2019  Nursing: Chong Sicilian, RN 02/08/2019   RN Care Manager: 02/08/2019   Social Worker: Ardelle Anton, LCSW 02/08/2019   Recreational Therapist:  02/08/2019  Other:  02/08/2019   Other:  02/08/2019   Other: 02/08/2019       Scribe for Treatment Team: Trecia Rogers, LCSW 02/08/2019 10:13 AM

## 2019-02-09 LAB — HEPATITIS B SURFACE ANTIGEN: Hepatitis B Surface Ag: NEGATIVE

## 2019-02-09 LAB — HEPATITIS C ANTIBODY: HCV Ab: <0.1 s/co ratio (ref 0.0–0.9)

## 2019-02-09 NOTE — Progress Notes (Signed)
The patient's positive event for the day is that he had a good talk with his son over the telephone. His goal for tomorrow is to try and stay positive.

## 2019-02-09 NOTE — Progress Notes (Signed)
D: Patient observed somewhat isolative to self. Continues to report irritability though is cooperative with assessment. Reports abdominal cramping, body aches/spasms, anxiety and agitation however states some improvement over the last 24 hours. Patient's affect blunted, mood irritable. Generalized body pain at a 4/10. COVID-19 screen negative, afebrile. Respiratory assessment WDL.  A: Medicated per orders, prn trazadone, vistaril, bentyl, robaxin, and naproxen given for discomfort, sleep. Medication education provided. Level III obs in place for safety. Emotional support offered. Patient encouraged to complete Suicide Safety Plan before discharge. Encouraged to attend and participate in unit programming.  Fall prevention plan in place and reviewed with patient as pt is a high fall risk.   R: Patient verbalizes understanding of POC, falls prevention education. On reassess, patient is resting in bed. Patient denies SI/HI/AVH and remains safe on level III obs. Will continue to monitor throughout the night.

## 2019-02-09 NOTE — Progress Notes (Signed)
```    D Pt is observed OOB UAL on the 300 hall today- he endorses a flat, depressed affect. HE wears the same clothes that he wore yesterday. Theyare dirty and wrinkled. He says he does not want to bathe today.     A He completed his daily assessment and on this he wrote he denied SI and he rated his depression, hopelessness and anxiety " 0/0/3", respectively. He says he will be dc'd tomorrow.    R Safety in place.

## 2019-02-09 NOTE — Plan of Care (Signed)
  Problem: Education: Goal: Knowledge of [facility] Education information/materials will improve Outcome: Progressing   

## 2019-02-09 NOTE — Progress Notes (Signed)
Adult Psychoeducational Group Note  Date:  02/09/2019 Time:  1300  Group Topic/Focus:  Identifying Needs:   The focus of this group is to help patients identify their personal needs that have been historically problematic and identify healthy behaviors to address their needs.  Participation Level:  Did Not Attend  Participation Quality:    Affect:    Cognitive:    Insight:   Engagement in Group:    Modes of Intervention:    Additional Comments:    Lauralyn Primes 02/09/2019, 4:57 PM

## 2019-02-09 NOTE — Progress Notes (Signed)
[facility] NOVEL CORONAVIRUS (COVID-19) DAILY CHECK-OFF SYMPTOMS - answer yes or no to each - every day NO YES  Have you had a fever in the past 24 hours?  . Fever (Temp > 37.80C / 100F) X   Have you had any of these symptoms in the past 24 hours? . New Cough .  Sore Throat  .  Shortness of Breath .  Difficulty Breathing .  Unexplained Body Aches   X   Have you had any one of these symptoms in the past 24 hours not related to allergies?   . Runny Nose .  Nasal Congestion .  Sneezing   X   If you have had runny nose, nasal congestion, sneezing in the past 24 hours, has it worsened?  X   EXPOSURES - check yes or no X   Have you traveled outside the state in the past 14 days?  X   Have you been in contact with someone with a confirmed diagnosis of COVID-19 or PUI in the past 14 days without wearing appropriate PPE?  X   Have you been living in the same home as a person with confirmed diagnosis of COVID-19 or a PUI (household contact)?    X   Have you been diagnosed with COVID-19?    X              What to do next: Answered NO to all: Answered YES to anything:   Proceed with unit schedule Follow the [facility] Inpatient Flowsheet.   

## 2019-02-09 NOTE — [facility] Suicide Risk Assessment (Signed)
[facility] [place] INPATIENT:  Family/Significant Other Suicide Prevention Education  Suicide Prevention Education:  Patient Refusal for Family/Significant Other Suicide Prevention Education: The patient Jon Castro has refused to provide written consent for family/significant other to be provided Family/Significant Other Suicide Prevention Education during admission and/or prior to discharge.  Physician notified.  Berlin Hun Grossman-Orr 02/09/2019, 2:20 PM
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
## 2019-02-09 NOTE — [facility] Group Notes (Signed)
LCSW Group Therapy Note  02/09/2019   10:00-11:00am   Type of Therapy and Topic:  Group Therapy: Anger Cues and Responses  Participation Level:  Active   Description of Group:   In this group, patients learned how to recognize the physical, cognitive, emotional, and behavioral responses they have to anger-provoking situations.  They identified a recent time they became angry and how they reacted.  They analyzed how their reaction was possibly beneficial and how it was possibly unhelpful.  The group discussed a variety of healthier coping skills that could help with such a situation in the future.  Deep breathing was practiced briefly.  Therapeutic Goals: 1. Patients will remember their last incident of anger and how they felt emotionally and physically, what their thoughts were at the time, and how they behaved. 2. Patients will identify how their behavior at that time worked for them, as well as how it worked against them. 3. Patients will explore possible new behaviors to use in future anger situations. 4. Patients will learn that anger itself is normal and cannot be eliminated, and that healthier reactions can assist with resolving conflict rather than worsening situations.  Summary of Patient Progress:  The patient shared that his most recent time of anger involved an argument with his father on the phone and said he recognizes that his father is the way he is and no amount of talking could change that. He stated he understands that he has the ability to respond in a positive manner but understands also there are limits with certain people. Patient is aware that anger is normal and that it is accompanied by physical and emotional cues.  Therapeutic Modalities:   Cognitive Behavioral Therapy  Rolanda Jay

## 2019-02-09 NOTE — Progress Notes (Signed)
[facility] MD Progress Note  02/09/2019 11:25 AM Cam Dauphin  MRN:  [id number] Subjective: Patient reports some improvement compared to admission.  He acknowledges that symptoms of opiate withdrawal are abating and currently does not appear to be in any acute distress.  He states he is feeling "better".  As he improves he is becoming more future oriented and is hoping for discharge early next week.  He states he is considering going to rehab as an option.  Objective : Chart notes reviewed, patient seen. 33 year old male, presented to ED reporting opiate dependence, depression, suicidal ideations. Reports using heroin ( IV) daily. Denies prior psychiatric admissions, attributes depression to substance abuse.  Currently patient reports improving, but not completely resolved,  symptoms of opiate withdrawal and does not appear to be in any acute distress.  Denies vomiting or diarrhea today, vitals are stable.  Remains vaguely dysphoric/irritable but states he is feeling better.  Denies suicidal ideations and presents future oriented. Currently does not endorse medication side effects. Visible on unit, in dayroom, no disruptive or agitated behaviors on unit Labs reviewed with patient-hep C, hep B surface antigen, HIV negative.  TSH mildly decreased at 0.296.  Patient denies history of thyroid disorder.   Principal Problem:  Opiate Use Disorder, Opiate Induced Mood Disorder  Diagnosis: Active Problems:   MDD (major depressive disorder), severe ([place])  Total Time spent with patient: 15 minutes  Past Psychiatric History: See admission H&P  Past Medical History:  Past Medical History:  Diagnosis Date  . Back pain    History reviewed. No pertinent surgical history. Family History: History reviewed. No pertinent family history. Family Psychiatric  History: See admission H&P Social History:  Social History   Substance and Sexual Activity  Alcohol Use No     Social History   Substance and Sexual  Activity  Drug Use Yes  . Types: Cocaine, Marijuana, Heroin    Social History   Socioeconomic History  . Marital status: Single    Spouse name: Not on file  . Number of children: Not on file  . Years of education: Not on file  . Highest education level: Not on file  Occupational History  . Not on file  Social Needs  . Financial resource strain: Not on file  . Food insecurity    Worry: Not on file    Inability: Not on file  . Transportation needs    Medical: Not on file    Non-medical: Not on file  Tobacco Use  . Smoking status: Current Some Day Smoker  . Smokeless tobacco: Never Used  Substance and Sexual Activity  . Alcohol use: No  . Drug use: Yes    Types: Cocaine, Marijuana, Heroin  . Sexual activity: Not on file  Lifestyle  . Physical activity    Days per week: Not on file    Minutes per session: Not on file  . Stress: Not on file  Relationships  . Social Herbalist on phone: Not on file    Gets together: Not on file    Attends religious service: Not on file    Active member of club or organization: Not on file    Attends meetings of clubs or organizations: Not on file    Relationship status: Not on file  Other Topics Concern  . Not on file  Social History Narrative  . Not on file   Additional Social History:   Sleep: Improving  Appetite:  Fair  Current  Medications: Current Facility-Administered Medications  Medication Dose Route Frequency Provider Last Rate Last Dose  . alum & mag hydroxide-simeth (MAALOX/MYLANTA) 200-200-20 MG/5ML suspension 30 mL  30 mL Oral Q4H PRN Money, Feliz Beamravis B, FNP      . cloNIDine (CATAPRES) tablet 0.1 mg  0.1 mg Oral QID Natalie Mceuen A, MD   0.1 mg at 02/09/19 0900   Followed by  . cloNIDine (CATAPRES) tablet 0.1 mg  0.1 mg Oral BH-qamhs Lyric Rossano, Rockey SituFernando A, MD       Followed by  . [START ON 02/12/2019] cloNIDine (CATAPRES) tablet 0.1 mg  0.1 mg Oral QAC breakfast Ralynn San A, MD      . dicyclomine (BENTYL)  tablet 20 mg  20 mg Oral Q6H PRN Sherra Kimmons, Rockey SituFernando A, MD   20 mg at 02/08/19 2058  . hydrOXYzine (ATARAX/VISTARIL) tablet 25 mg  25 mg Oral Q6H PRN Allina Riches, Rockey SituFernando A, MD   25 mg at 02/08/19 2057  . loperamide (IMODIUM) capsule 2-4 mg  2-4 mg Oral PRN Alexes Menchaca, Rockey SituFernando A, MD   2 mg at 02/07/19 2116  . magnesium hydroxide (MILK OF MAGNESIA) suspension 30 mL  30 mL Oral Daily PRN Money, Gerlene Burdockravis B, FNP      . methocarbamol (ROBAXIN) tablet 500 mg  500 mg Oral Q8H PRN Kenon Delashmit, Rockey SituFernando A, MD   500 mg at 02/08/19 2058  . naproxen (NAPROSYN) tablet 500 mg  500 mg Oral BID PRN Brolin Dambrosia, Rockey SituFernando A, MD   500 mg at 02/08/19 2058  . nicotine (NICODERM CQ - dosed in mg/24 hours) patch 21 mg  21 mg Transdermal Daily Ellizabeth Dacruz, Rockey SituFernando A, MD   21 mg at 02/09/19 0910  . ondansetron (ZOFRAN-ODT) disintegrating tablet 4 mg  4 mg Oral Q6H PRN Lakira Ogando, Rockey SituFernando A, MD   4 mg at 02/07/19 2115  . traZODone (DESYREL) tablet 50 mg  50 mg Oral QHS PRN Kerry HoughSimon, Spencer E, PA-C   50 mg at 02/08/19 2329    Lab Results:  Results for orders placed or performed during the hospital encounter of 02/07/19 (from the past 48 hour(s))  Hepatitis B surface antigen     Status: None   Collection Time: 02/08/19  7:05 AM  Result Value Ref Range   Hepatitis B Surface Ag Negative Negative    Comment: (NOTE) Performed At: [facility] [place] [place] [address] [place], [place] [id number] Jolene SchimkeNagendra Sanjai MD WU:[phone]   Hepatitis C antibody     Status: None   Collection Time: 02/08/19  7:05 AM  Result Value Ref Range   HCV Ab <0.1 0.0 - 0.9 s/co ratio    Comment: (NOTE)                                  Negative:     < 0.8                             Indeterminate: 0.8 - 0.9                                  Positive:     > 0.9 The CDC recommends that a positive HCV antibody result be followed up with a HCV Nucleic Acid Amplification test ([id number]). Performed At: [facility] [place] [address] [place], [place] [id number] Jolene SchimkeNagendra Sanjai MD  GE:[phone]  HIV Antibody (routine testing w rflx)     Status: None   Collection Time: 02/08/19  7:05 AM  Result Value Ref Range   HIV Screen 4th Generation wRfx Non Reactive Non Reactive    Comment: (NOTE) Performed At: [facility] [place] [address] [place], [place] [id number] Jolene SchimkeNagendra Sanjai MD WU:[id number]:[phone]   TSH     Status: Abnormal   Collection Time: 02/08/19  7:05 AM  Result Value Ref Range   TSH 0.296 (L) 0.350 - 4.500 uIU/mL    Comment: Performed by a 3rd Generation assay with a functional sensitivity of <=0.01 uIU/mL. Performed at [facility], [address] [place], [place] [address]     Blood Alcohol level:  Lab Results  Component Value Date   ETH <10 02/06/2019   ETH <5 01/21/2015    Metabolic Disorder Labs: No results found for: HGBA1C, MPG No results found for: PROLACTIN No results found for: CHOL, TRIG, HDL, CHOLHDL, VLDL, LDLCALC  Physical Findings: AIMS:  , ,  ,  ,    CIWA:    COWS:  COWS Total Score: 6  Musculoskeletal: Strength & Muscle Tone: within normal limits Gait & Station: normal Patient leans: N/A  Psychiatric Specialty Exam: Physical Exam  Nursing note and vitals reviewed. Constitutional: He is oriented to person, place, and time. He appears well-developed and well-nourished.  Cardiovascular: Normal rate.  Respiratory: Effort normal.  Neurological: He is alert and oriented to person, place, and time.    Review of Systems  Constitutional: Positive for malaise/fatigue. Negative for chills and fever.  Respiratory: Negative for cough and shortness of breath.   Cardiovascular: Negative for chest pain.  Gastrointestinal: Positive for abdominal pain and heartburn. Negative for diarrhea and vomiting.  Musculoskeletal: Positive for myalgias.  Neurological: Positive for headaches.  Psychiatric/Behavioral: Positive for substance abuse. Negative for depression, hallucinations and suicidal ideas. The patient is not  nervous/anxious and does not have insomnia.   No chest pain or shortness of breath, no vomiting today, no fever, no chills  Blood pressure 131/86, pulse 66, temperature 98 F (36.7 C), temperature source Oral, resp. rate 20, height 6\' 1"  (1.854 m), weight 73.5 kg, SpO2 100 %.Body mass index is 21.37 kg/m.  General Appearance: Fairly Groomed  Eye Contact:  Fair  Speech:  normal  Volume:  Normal  Mood:  Partially improved, remains vaguely dysphoric and irritable  Affect:  Congruent  Thought Process:  Linear and Descriptions of Associations: Intact  Orientation:  Other:  Alert and attentive  Thought Content:  No hallucinations, no delusions  Suicidal Thoughts:  No at this time denies suicidal or self-injurious ideations, denies homicidal or violent ideations  Homicidal Thoughts:  No  Memory:  Recent and remote grossly intact  Judgement:  Other:  Improving  Insight:  Fair/improving  Psychomotor Activity:  Normal-no psychomotor agitation  Concentration:  Concentration: Improving and Attention Span: Improving  Recall:  Good  Fund of Knowledge:  Good  Language:  Good  Akathisia:  No  Handed:  Right  AIMS (if indicated):     Assets:  Communication Skills Desire for Improvement Resilience  ADL's:  Intact  Cognition:  WNL  Sleep:  Number of Hours: 6.75   Assessment:  33 year old male, presented to ED reporting opiate dependence, depression, suicidal ideations. Reports using heroin ( IV) daily. Denies prior psychiatric admissions, attributes depression to substance abuse.  Today patient describes improved but not resolved symptoms of opiate withdrawal.  He appears calm, comfortable and  in no acute distress.  Vitals are stable.  Mood somewhat improved but affect remains vaguely dysphoric/irritable.  Denies medication side effects.  No suicidal ideations and presents future oriented.  Currently not interested in starting an antidepressant medication, reports mood symptoms are  substance-induced and tend to improve with sobriety.  Treatment Plan Summary: Daily contact with patient to assess and evaluate symptoms and progress in treatment and Medication management  Treatment plan reviewed as below today 6/13 Treatment team working on disposition planning options Encourage group and milieu participation to work on Pharmacologistcoping skills and symptom reduction Encourage efforts to work on sobriety and relapse prevention Continue clonidine COWS protocol for opioid withdrawal Continue Vistaril 25 mg PO Q6HR PRN anxiety Continue trazodone 50 mg PO QHS PRN insomnia Will check T3, T4 to follow-up on decreased TSH.  Craige CottaFernando A Jesicca Dipierro, MD 02/09/2019, 11:25 AM   Patient ID: Janene MadeiraKevin Wallman, male   DOB: 1986-07-09, 33 y.o.   MRN: [id number]

## 2019-02-10 LAB — T4, FREE: Free T4: 0.67 ng/dL (ref 0.61–1.12)

## 2019-02-10 NOTE — Progress Notes (Signed)
Patient ID: Jon Castro, male   DOB: 1985/11/03, 33 y.o.   MRN: [id number]  Nursing Progress Note [id number]  Patient seen up in the milieu this morning. Patient presents calm and cooperative with no concerns. Patient compliant with scheduled medications. Patient currently denies SI/HI/AVH. Patient denies significant withdrawal symptoms at this time. Patient states he is interested in discharge.  Patient is educated about and provided medication per provider's orders. Patient safety maintained with q15 min safety checks and high fall risk precautions. Emotional support given, 1:1 interaction, and active listening provided. Patient encouraged to attend meals, groups, and work on treatment plan and goals. Labs, vital signs and patient behavior monitored throughout shift. Patient encouraged to wear mask when in the milieu and is educated about coronavirus infection control precautions.  Patient refusing to wear mask on the unit but is compliant to wear mask off unit. Patient contracts for safety with staff. Patient remains safe on the unit at this time and agrees to come to staff with any issues/concerns. Patient is interacting with peers appropriately on the unit. Will continue to support and monitor.

## 2019-02-10 NOTE — Progress Notes (Signed)
[place] Group Notes:  (Nursing/MHT/Case Management/Adjunct)  Date:  02/10/2019  Time:  2030  Type of Therapy:  wrap up group  Participation Level:  Active  Participation Quality:  Appropriate, Attentive, Sharing and Supportive  Affect:  Irritable  Cognitive:  Appropriate  Insight:  Improving  Engagement in Group:  Engaged  Modes of Intervention:  Clarification, Education and Support  Summary of Progress/Problems: Pt is looking forward to a plan of discharge tomorrow. Pt plans on finding more positive things to do and more positive people to hang around. Pt is grateful for his family.   Shellia Cleverly 02/10/2019, 9:27 PM

## 2019-02-10 NOTE — [facility] Group Notes (Signed)
[place] LCSW Group Therapy Note  Date/Time:  02/10/2019 9:00-10:00 or 10:00-11:00AM  Type of Therapy and Topic:  Group Therapy:  Healthy and Unhealthy Supports  Participation Level:  Active   Description of Group:  Patients in this group were introduced to the idea of adding a variety of healthy supports to address the various needs in their lives.Patients discussed what additional healthy supports could be helpful in their recovery and wellness after discharge in order to prevent future hospitalizations.   An emphasis was placed on using counselor, doctor, therapy groups, 12-step groups, and problem-specific support groups to expand supports.  They also worked as a group on developing a specific plan for several patients to deal with unhealthy supports through [place], psychoeducation with loved ones, and even termination of relationships.   Therapeutic Goals:   1)  discuss importance of adding supports to stay well once out of the hospital  2)  compare healthy versus unhealthy supports and identify some examples of each  3)  generate ideas and descriptions of healthy supports that can be added  4)  offer mutual support about how to address unhealthy supports  5)  encourage active participation in and adherence to discharge plan    Summary of Patient Progress:  The patient stated that current healthy supports in his life are family while current unhealthy supports include people he used with.  The patient expressed a willingness to add  therapy and support groups as supports to help in his recovery journey.   Therapeutic Modalities:   Motivational Interviewing Brief Solution-Focused Therapy  Rolanda Jay

## 2019-02-10 NOTE — Progress Notes (Signed)
D: Kyian denied SI, HI, AVH, or S/S of withdrawal. He spent most of the evening out in the milieu with peers, took scheduled medications, and denied any concerns or complaints when queried.   A: Scheduled meds given as ordered, with no adverse side effects noted or reported. Trazodone and Vistaril given at pt's request. Pt urged to bring concerns, needs, and questions to staff as needed. Pt urged to attend groups and proceed with treatment plan. Q15 safety checks maintained.  R: Pt is resting in bed with eyes closed and regular/even/unlabored respirations. Pt remains free from harm and continues with treatment. Will continue to monitor for needs/safety.

## 2019-02-10 NOTE — Progress Notes (Signed)
D: Pt denied SI, HI, and AVH. At [facility], he said trazodone did not help him sleep. He was a bit irritable in this regard. Sleep hours documented as 6.25, but pt appears to have not slept well.   A: Scheduled meds given as ordered, with no adverse side effects noted or reported. Pt urged to bring concerns, needs, and questions to staff as needed. Pt urged to complete attend groups and proceed with treatment plan. Q15 safety checks maintained.  R: Pt remains free from harm and continues with treatment. Will continue to monitor for needs/safety.

## 2019-02-10 NOTE — Progress Notes (Signed)
Patient ID: Jon Castro, male   DOB: [birth year], 33 y.o.   MRN: [id number]  [place] NOVEL CORONAVIRUS (COVID-19) DAILY CHECK-OFF SYMPTOMS - answer yes or no to each - every day NO YES  Have you had a fever in the past 24 hours?  . Fever (Temp > 37.80C / 100F) X   Have you had any of these symptoms in the past 24 hours? . New Cough .  Sore Throat  .  Shortness of Breath .  Difficulty Breathing .  Unexplained Body Aches   X   Have you had any one of these symptoms in the past 24 hours not related to allergies?   . Runny Nose .  Nasal Congestion .  Sneezing   X   If you have had runny nose, nasal congestion, sneezing in the past 24 hours, has it worsened?  X   EXPOSURES - check yes or no X   Have you traveled outside the state in the past 14 days?  X   Have you been in contact with someone with a confirmed diagnosis of COVID-19 or PUI in the past 14 days without wearing appropriate PPE?  X   Have you been living in the same home as a person with confirmed diagnosis of COVID-19 or a PUI (household contact)?    X   Have you been diagnosed with COVID-19?    X              What to do next: Answered NO to all: Answered YES to anything:   Proceed with unit schedule Follow the [facility] Inpatient Flowsheet.

## 2019-02-10 NOTE — Progress Notes (Signed)
[facility] MD Progress Note  02/10/2019 3:15 PM Jon Castro  MRN:  [id number] Subjective: patient reports he is feeling better. States symptoms of opiate WDL have subsided . Describes improved mood . Currently future oriented and hoping to discharge soon.   Objective : Chart notes reviewed, patient seen. 33 year old male, presented to ED reporting opiate dependence, depression, suicidal ideations. Reports using heroin ( IV) daily. Denies prior psychiatric admissions, attributes depression to substance abuse.  Patient reports feeling " a lot better". He describes subsiding, resolving symptoms of opiate withdrawal , and currently presents calm,comfortable and in no acute distress . Denies SI. Presents future oriented, and hoping for discharge soon. Visible in day room, interactive. No disruptive or agitated behaviors. Presents calm, in no acute distress or discomfort . Tolerating Clonidine taper well , currently not on standing psychiatric medications. 6/14 labs- FT4 0.67    Principal Problem:  Opiate Use Disorder, Opiate Induced Mood Disorder  Diagnosis: Active Problems:   MDD (major depressive disorder), severe (HCC)  Total Time spent with patient: 15 minutes  Past Psychiatric History: See admission H&P  Past Medical History:  Past Medical History:  Diagnosis Date  . Back pain    History reviewed. No pertinent surgical history. Family History: History reviewed. No pertinent family history. Family Psychiatric  History: See admission H&P Social History:  Social History   Substance and Sexual Activity  Alcohol Use No     Social History   Substance and Sexual Activity  Drug Use Yes  . Types: Cocaine, Marijuana, Heroin    Social History   Socioeconomic History  . Marital status: Single    Spouse name: Not on file  . Number of children: Not on file  . Years of education: Not on file  . Highest education level: Not on file  Occupational History  . Not on file  Social Needs   . Financial resource strain: Not on file  . Food insecurity    Worry: Not on file    Inability: Not on file  . Transportation needs    Medical: Not on file    Non-medical: Not on file  Tobacco Use  . Smoking status: Current Some Day Smoker  . Smokeless tobacco: Never Used  Substance and Sexual Activity  . Alcohol use: No  . Drug use: Yes    Types: Cocaine, Marijuana, Heroin  . Sexual activity: Not on file  Lifestyle  . Physical activity    Days per week: Not on file    Minutes per session: Not on file  . Stress: Not on file  Relationships  . Social Herbalist on phone: Not on file    Gets together: Not on file    Attends religious service: Not on file    Active member of club or organization: Not on file    Attends meetings of clubs or organizations: Not on file    Relationship status: Not on file  Other Topics Concern  . Not on file  Social History Narrative  . Not on file   Additional Social History:   Sleep: Improving  Appetite:  improving  Current Medications: Current Facility-Administered Medications  Medication Dose Route Frequency Provider Last Rate Last Dose  . alum & mag hydroxide-simeth (MAALOX/MYLANTA) 200-200-20 MG/5ML suspension 30 mL  30 mL Oral Q4H PRN Money, Darnelle Maffucci B, FNP      . cloNIDine (CATAPRES) tablet 0.1 mg  0.1 mg Oral BH-qamhs Tally Mattox, Myer Peer, MD   0.1 mg  at 02/10/19 0744   Followed by  . [START ON 02/12/2019] cloNIDine (CATAPRES) tablet 0.1 mg  0.1 mg Oral QAC breakfast Herbie Lehrmann A, MD      . dicyclomine (BENTYL) tablet 20 mg  20 mg Oral Q6H PRN Jodeci Rini, Rockey SituFernando A, MD   20 mg at 02/08/19 2058  . hydrOXYzine (ATARAX/VISTARIL) tablet 25 mg  25 mg Oral Q6H PRN Alyda Megna, Rockey SituFernando A, MD   25 mg at 02/09/19 2111  . loperamide (IMODIUM) capsule 2-4 mg  2-4 mg Oral PRN Marlaine Arey, Rockey SituFernando A, MD   2 mg at 02/07/19 2116  . magnesium hydroxide (MILK OF MAGNESIA) suspension 30 mL  30 mL Oral Daily PRN Money, Gerlene Burdockravis B, FNP      . methocarbamol  (ROBAXIN) tablet 500 mg  500 mg Oral Q8H PRN Jakori Burkett, Rockey SituFernando A, MD   500 mg at 02/08/19 2058  . naproxen (NAPROSYN) tablet 500 mg  500 mg Oral BID PRN Deacon Gadbois, Rockey SituFernando A, MD   500 mg at 02/08/19 2058  . nicotine (NICODERM CQ - dosed in mg/24 hours) patch 21 mg  21 mg Transdermal Daily Elleana Stillson, Rockey SituFernando A, MD   21 mg at 02/10/19 0744  . ondansetron (ZOFRAN-ODT) disintegrating tablet 4 mg  4 mg Oral Q6H PRN Nhat Hearne, Rockey SituFernando A, MD   4 mg at 02/07/19 2115  . traZODone (DESYREL) tablet 50 mg  50 mg Oral QHS PRN Kerry HoughSimon, Spencer E, PA-C   50 mg at 02/09/19 2111    Lab Results:  Results for orders placed or performed during the hospital encounter of 02/07/19 (from the past 48 hour(s))  T4, free     Status: None   Collection Time: 02/10/19  7:37 AM  Result Value Ref Range   Free T4 0.67 0.61 - 1.12 ng/dL    Comment: Please note change in reference range. (NOTE) Biotin ingestion may interfere with free T4 tests. If the results are inconsistent with the TSH level, previous test results, or the clinical presentation, then consider biotin interference. If needed, order repeat testing after stopping biotin. Performed at [facility], [address] [place], [place] [address]     Blood Alcohol level:  Lab Results  Component Value Date   ETH <10 02/06/2019   ETH <5 01/21/2015    Metabolic Disorder Labs: No results found for: HGBA1C, MPG No results found for: PROLACTIN No results found for: CHOL, TRIG, HDL, CHOLHDL, VLDL, LDLCALC  Physical Findings: AIMS:  , ,  ,  ,    CIWA:    COWS:  COWS Total Score: 1  Musculoskeletal: Strength & Muscle Tone: within normal limits Gait & Station: normal Patient leans: N/A  Psychiatric Specialty Exam: Physical Exam  Nursing note and vitals reviewed. Constitutional: He is oriented to person, place, and time. He appears well-developed and well-nourished.  Cardiovascular: Normal rate.  Respiratory: Effort normal.  Neurological: He is alert and  oriented to person, place, and time.    Review of Systems  Constitutional: Positive for malaise/fatigue. Negative for chills and fever.  Respiratory: Negative for cough and shortness of breath.   Cardiovascular: Negative for chest pain.  Gastrointestinal: Positive for abdominal pain and heartburn. Negative for diarrhea and vomiting.  Musculoskeletal: Positive for myalgias.  Neurological: Positive for headaches.  Psychiatric/Behavioral: Positive for substance abuse. Negative for depression, hallucinations and suicidal ideas. The patient is not nervous/anxious and does not have insomnia.   No chest pain or shortness of breath, no vomiting today, no fever, no chills  Blood pressure 126/81, pulse 83, temperature 98.4 F (36.9 C), temperature source Oral, resp. rate 20, height 6\' 1"  (1.854 m), weight 73.5 kg, SpO2 100 %.Body mass index is 21.37 kg/m.  General Appearance: improved grooming   Eye Contact:  Good  Speech:  normal  Volume:  Normal  Mood:  improving mood, states he is feeling better  Affect:  Appropriate and reactive  Thought Process:  Linear and Descriptions of Associations: Intact  Orientation:  Other:  Alert and attentive  Thought Content:  No hallucinations, no delusions  Suicidal Thoughts:  No at this time denies suicidal or self-injurious ideations, denies homicidal or violent ideations  Homicidal Thoughts:  No  Memory:  Recent and remote grossly intact  Judgement:  Other:  Improving  Insight:  Fair/improving  Psychomotor Activity:  Normal-no psychomotor agitation, appears comfortable, in no acute distress  Concentration:  Concentration: Good and Attention Span: Good  Recall:  Good  Fund of Knowledge:  Good  Language:  Good  Akathisia:  No  Handed:  Right  AIMS (if indicated):     Assets:  Communication Skills Desire for Improvement Resilience  ADL's:  Intact  Cognition:  WNL  Sleep:  Number of Hours: 6.25   Assessment:  33 year old male, presented to ED  reporting opiate dependence, depression, suicidal ideations. Reports using heroin ( IV) daily. Denies prior psychiatric admissions, attributes depression to substance abuse.  Patient reports he is feeling better and that symptoms of opiate WDL have subsided . He presents calm, comfortable and in no acute distress. Denies SI and is future oriented. Currently not on standing psychiatric medications other than clonidine detox protocol, which he is tolerating well thus far   Treatment Plan Summary: Daily contact with patient to assess and evaluate symptoms and progress in treatment and Medication management  Treatment plan reviewed as below today 6/14 Treatment team working on disposition planning options Encourage group and milieu participation to work on coping skills and symptom reduction Encourage efforts to work on sobriety and relapse prevention Continue Clonidine COWS protocol for opioid withdrawal Continue Vistaril 25 mg PO Q6HR PRN anxiety Continue Trazodone 50 mg PO QHS PRN insomnia .  Craige CottaFernando A Cierria Height, MD 02/10/2019, 3:15 PM   Patient ID: Jon Castro, male   DOB: 07-Nov-1985, 33 y.o.   MRN: [id number]

## 2019-02-10 NOTE — Plan of Care (Signed)
  Problem: Education: Goal: Knowledge of [facility] Education information/materials will improve Outcome: Progressing   Problem: Activity: Goal: Interest or engagement in activities will improve Outcome: Progressing   Problem: Health Behavior/Discharge Planning: Goal: Compliance with treatment plan for underlying cause of condition will improve Outcome: Progressing   Problem: Safety: Goal: Periods of time without injury will increase Outcome: Progressing   

## 2019-02-11 DIAGNOSIS — F1124 Opioid dependence with opioid-induced mood disorder: Secondary | ICD-10-CM

## 2019-02-11 LAB — T3, FREE: T3, Free: 2.2 pg/mL (ref 2.0–4.4)

## 2019-02-11 MED ORDER — TRAZODONE HCL 50 MG PO TABS: 50.0000 mg | ORAL_TABLET | Freq: Every evening | ORAL | 0 refills | Status: DC | PRN

## 2019-02-11 MED ORDER — NICOTINE 21 MG/24HR TD PT24: 21.0000 mg | MEDICATED_PATCH | Freq: Every day | TRANSDERMAL | 0 refills | Status: DC

## 2019-02-11 MED ORDER — HYDROXYZINE HCL 25 MG PO TABS: 25.0000 mg | ORAL_TABLET | Freq: Four times a day (QID) | ORAL | 0 refills | Status: DC | PRN

## 2019-02-11 NOTE — Progress Notes (Signed)
Jon Castro SYMPTOMS - answer yes or no to each - every day NO YES  Have you had a fever in the past 24 hours?  . Fever (Temp > 37.80C / 100F) X   Have you had any of these symptoms in the past 24 hours? . New Cough .  Sore Throat  .  Shortness of Breath .  Difficulty Breathing .  Unexplained Body Aches   X   Have you had any one of these symptoms in the past 24 hours not related to allergies?   . Runny Nose .  Nasal Congestion .  Sneezing   X   If you have had runny nose, nasal congestion, sneezing in the past 24 hours, has it worsened?  X   EXPOSURES - check yes or no X   Have you traveled outside the state in the past 14 days?  X   Have you been in contact with someone with a confirmed diagnosis of COVID-19 or PUI in the past 14 days without wearing appropriate PPE?  X   Have you been living in the same home as a person with confirmed diagnosis of COVID-19 or a PUI (household contact)?    X   Have you been diagnosed with COVID-19?    X              What to do next: Answered NO to all: Answered YES to anything:   Proceed with unit schedule Follow the [facility] Inpatient Flowsheet.  Patient ID: Jon Castro, male   DOB: Nov 11, 1985, 33 y.o.   MRN: [id number]

## 2019-02-11 NOTE — Progress Notes (Signed)
  [facility] Adult Case Management Discharge Plan :  Will you be returning to the same living situation after discharge:  Yes,  home At discharge, do you have transportation home?: Yes,  taking the bus Do you have the ability to pay for your medications: No. Referred to [facility]  Release of information consent forms completed and in the chart. Patient to Follow up at: Follow-up Information    [facility] Follow up on 02/18/2019.   Why: Telephonic hospital follow up appointment is Monday, 6/22 at 9:30a.  The provider will contact you the day of the appointment.  Contact information: [address] [place] [address] [phone]           Next level of care provider has access to [place] and Suicide Prevention discussed: Yes,  with patient, declined consents  Have you used any form of tobacco in the last 30 days? (Cigarettes, Smokeless Tobacco, Cigars, and/or Pipes): Yes  Has patient been referred to the Quitline?: Patient refused referral  Patient has been referred for addiction treatment: Yes  Joellen Jersey, [place] 02/11/2019, 10:51 AM

## 2019-02-11 NOTE — [facility] Suicide Risk Assessment (Signed)
[facility] Discharge Suicide Risk Assessment   Principal Problem: Opiate Use Disorder  Discharge Diagnoses: Active Problems:   MDD (major depressive disorder), severe ([place])   Total Time spent with patient: 30 minutes  Musculoskeletal: Strength & Muscle Tone: within normal limits Gait & Station: normal Patient leans: N/A  Psychiatric Specialty Exam: ROS denies chest pain, no shortness of breath, no cough, no vomiting, no fever or chills   Blood pressure 128/83, pulse 70, temperature 98.2 F (36.8 C), temperature source Oral, resp. rate 20, height 6\' 1"  (1.854 m), weight 73.5 kg, SpO2 100 %.Body mass index is 21.37 kg/m.  General Appearance: improved grooming   Eye Contact::  Good  Speech:  Normal Rate409  Volume:  Normal  Mood:  improving mood, currently denies feeling depressed   Affect:  more reactive, fuller in range   Thought Process:  Linear and Descriptions of Associations: Intact  Orientation:  Full (Time, Place, and Person)  Thought Content:  no hallucinations, no delusions, not internally preoccupied   Suicidal Thoughts:  No denies suicidal or self injurious ideations, denies homicidal or violent ideations, contracts for safety on unit   Homicidal Thoughts:  No  Memory:  recent and remote grossly intact   Judgement:  Other:  improving   Insight:  fair- improving   Psychomotor Activity:  Normal  Concentration:  Good  Recall:  Good  Fund of Knowledge:Good  Language: Good  Akathisia:  Negative  Handed:  Right  AIMS (if indicated):     Assets:  Communication Skills Desire for Improvement Resilience  Sleep:  Number of Hours: 4  Cognition: WNL  ADL's:  Intact   Mental Status Per Nursing Assessment::   On Admission:  Suicidal ideation indicated by patient  Demographic Factors:  15. Single, one 34 year old child, who is living with the mother. Currently unemployed. Had been staying in a hotel prior to admission.  Loss Factors: Substance abuse ,  unemployment  Historical Factors: History of substance use disorder ( opiates), history of depression which he characterizes as substance induced, improving when sober . Denies prior psychiatric admissions.  Risk Reduction Factors:   Positive coping skills or problem solving skills  Continued Clinical Symptoms:  Alert , attentive, currently calm, pleasant on approach, well related, reports mood as " doing OK" and denies feeling depressed at this time. Affect is reactive and appropriate, no thought disorder, no suicidal or self injurious ideations, no homicidal or violent ideations, no psychotic symptoms. Behavior on unit in good control, visible in day room, interactive with peers Currently not on standing psychiatric medications  Lab results have been reviewed with patient Denies lingering or ongoing symptoms of opiate withdrawal and presents calm, in no acute distress, comfortable, vitals stable.   Cognitive Features That Contribute To Risk:  No gross cognitive deficits noted upon discharge. Is alert , attentive, and oriented x 3   Suicide Risk:  Mild:  Suicidal ideation of limited frequency, intensity, duration, and specificity.  There are no identifiable plans, no associated intent, mild dysphoria and related symptoms, good self-control (both objective and subjective assessment), few other risk factors, and identifiable protective factors, including available and accessible social support.  Follow-up Information    [facility] Follow up on 02/18/2019.   Why: Telephonic hospital follow up appointment is Monday, 6/22 at 9:30a.  The provider will contact you the day of the appointment.  Contact information: [address] [place] [address] [phone]           Plan  Of Care/Follow-up recommendations:  Activity:  as tolerated Diet:  regular Tests:  NA Other:  See below  Patient requests discharge, expresses readiness for discharge Leaving unit in good spirits Follow up  as above  States he plans to live with his grandfather Reports motivation in sobriety, encouraged to consider 12 step meeting participation  Craige CottaFernando A Cobos, MD 02/11/2019, 11:23 AM

## 2019-02-11 NOTE — Progress Notes (Signed)
Patient ID: Jon Castro, male   DOB: 05/31/1986, 33 y.o.   MRN: [id number] Pt d/c to home. D/c instructions and medications reviewed and given. Pt verbalizes understanding.

## 2019-02-11 NOTE — Progress Notes (Signed)
Recreation Therapy Notes  Date:  6.15.20 Time: 0930 Location: [address] Dayroom  Group Topic: Stress Management  Goal Area(s) Addresses:  Patient will identify positive stress management techniques. Patient will identify benefits of using stress management post d/c.  Behavioral Response:  Engaged  Intervention: Stress Management  Activity :  Meditation.  LRT introduced the stress management technique of meditation.  LRT played a meditation of making the most of your day.  Patients were to listen and follow along as meditation played to engage in activity.  Education:  Stress Management, Discharge Planning.   Education Outcome: Acknowledges Education  Clinical Observations/Feedback:  Pt attended and participated in group.     Victorino Sparrow, LRT/CTRS         Ria Comment, Taylen Wendland A 02/11/2019 11:06 AM

## 2019-02-11 NOTE — Discharge Summary (Addendum)
Physician Discharge Summary Note  Patient:  Jon Castro is an 33 y.o., male MRN:  [id number] DOB:  1986-06-09 Patient phone:  [phone] (home)  Patient address:   [address] [place] [id number],  Total Time spent with patient: 15 minutes  Date of Admission:  02/07/2019 Date of Discharge: 02/11/19  Reason for Admission:  suicidal ideation with opioid abuse  Principal Problem: <principal problem not specified> Discharge Diagnoses: Active Problems:   MDD (major depressive disorder), severe (HCC)   Opioid dependence with opioid-induced mood disorder [facility](HCC)   Past Psychiatric History: Per admission H&P: No prior psychiatric admissions . Denies history of suicide attempts, denies history of self injurious behaviors . Denies history of psychosis. Describes history of depression, which he describes as substance induced, with improving mood during periods of sobriety. Denies history of mania.  Denies history of violence .  Denies panic or agoraphobia, but does endorse some increased anxiety symptoms recently.  Past Medical History:  Past Medical History:  Diagnosis Date  . Back pain    History reviewed. No pertinent surgical history. Family History: History reviewed. No pertinent family history. Family Psychiatric  History: denies history of mental illness in family, no suicides in family, father has history of alcohol use disorder Social History:  Social History   Substance and Sexual Activity  Alcohol Use No     Social History   Substance and Sexual Activity  Drug Use Yes  . Types: Cocaine, Marijuana, Heroin    Social History   Socioeconomic History  . Marital status: Single    Spouse name: Not on file  . Number of children: Not on file  . Years of education: Not on file  . Highest education level: Not on file  Occupational History  . Not on file  Social Needs  . Financial resource strain: Not on file  . Food insecurity    Worry: Not on file    Inability:  Not on file  . Transportation needs    Medical: Not on file    Non-medical: Not on file  Tobacco Use  . Smoking status: Current Some Day Smoker  . Smokeless tobacco: Never Used  Substance and Sexual Activity  . Alcohol use: No  . Drug use: Yes    Types: Cocaine, Marijuana, Heroin  . Sexual activity: Not on file  Lifestyle  . Physical activity    Days per week: Not on file    Minutes per session: Not on file  . Stress: Not on file  Relationships  . Social Musicianconnections    Talks on phone: Not on file    Gets together: Not on file    Attends religious service: Not on file    Active member of club or organization: Not on file    Attends meetings of clubs or organizations: Not on file    Relationship status: Not on file  Other Topics Concern  . Not on file  Social History Narrative  . Not on file    Hospital Course:  From admission H&P: Patient is a 33 year old male, presented to the ED requesting help with substance abuse, detox/ reporting history of opiate dependence. Reports long history of heroin use, and states has been using daily ( IV) up to day prior to admission. On admission also reported depression, suicidal ideations of walking into traffic, but currently denies suicidal ideations. States " I just got tired of using ".  He does endorse some recent depression, and presents vaguely constricted and  irritable in affect . Describes some neuro-vegetative symptoms as below, but currently denies changes in sleep, appetite or energy level. Denies psychotic symptoms. Admission BAL negative, admission UDS (+) for opiates, cocaine , cannabis . Patient presents calm, in no acute distress at this time. Vitals are stable, BP 119/79, pulse 63. He does endorse symptoms of opiate withdrawal, including some cramping , loose stools, aches . No vomiting at this time.  Mr. Jon Castro was admitted for suicidal ideation with heroin dependence. He was started on  COWS clonidine protocol. He participated  in group therapy on the unit. He reported IV heroin use. Labs drawn- hep B/C and HIV negative. [facility]. He stabilized with medication and therapy. He was discharged on the medications listed below. He has shown improvement with improved mood, affect, sleep, appetite, and interaction. He denies any SI/HI/AVH and contracts for safety. [facility] agrees [facility] (see below). He is provided with prescriptions for medications upon discharge. He is discharging home via bus.  Physical Findings: AIMS:  , ,  ,  ,    CIWA:    COWS:  COWS Total Score: 2  Musculoskeletal: Strength & Muscle Tone: within normal limits Gait & Station: normal Patient leans: N/A  Psychiatric Specialty Exam: Physical Exam  Nursing note and vitals reviewed. Constitutional: He is oriented to person, place, and time. He appears well-developed and well-nourished.  Cardiovascular: Normal rate.  Respiratory: Effort normal.  Neurological: He is alert and oriented to person, place, and time.    Review of Systems  Constitutional: Negative.   Psychiatric/Behavioral: Positive for substance abuse. Negative for depression, hallucinations and suicidal ideas. The patient is not nervous/anxious and does not have insomnia.     Blood pressure 128/83, pulse 70, temperature 98.2 F (36.8 C), temperature source Oral, resp. rate 20, height 6\' 1"  (1.854 m), weight 73.5 kg, SpO2 100 %.Body mass index is 21.37 kg/m.  See MD's discharge SRA     Have you used any form of tobacco in the last 30 days? (Cigarettes, Smokeless Tobacco, Cigars, and/or Pipes): Yes  Has this patient used any form of tobacco in the last 30 days? (Cigarettes, Smokeless Tobacco, Cigars, and/or Pipes) Yes, a prescription for an FDA-approved medication for tobacco cessation was offered at discharge.   Blood Alcohol level:  Lab Results  Component Value Date   ETH <10 02/06/2019   ETH <5 01/21/2015    Metabolic Disorder Labs:  No  results found for: HGBA1C, MPG No results found for: PROLACTIN No results found for: CHOL, TRIG, HDL, CHOLHDL, VLDL, LDLCALC  See Psychiatric Specialty Exam and Suicide Risk Assessment completed by Attending Physician prior to discharge.  Discharge destination:  Home  Is patient on multiple antipsychotic therapies at discharge:  No   Has Patient had three or more failed trials of antipsychotic monotherapy by history:  No  Recommended Plan for Multiple Antipsychotic Therapies: NA  Discharge Instructions    Discharge instructions   Complete by: As directed    Patient is instructed to take all prescribed medications as recommended. Report any side effects or adverse reactions to your outpatient psychiatrist. Patient is instructed to abstain from alcohol and illegal drugs while on prescription medications. In the event of worsening symptoms, patient is instructed to call the crisis hotline, 911, or go to the nearest emergency department for evaluation and treatment.     Allergies as of 02/11/2019      Reactions   Vicodin [hydrocodone-acetaminophen]  Itching   Codeine Nausea And Vomiting   Tramadol Other (See Comments)   "messes with head"      Medication List    STOP taking these medications   clindamycin 150 MG capsule Commonly known as: CLEOCIN     TAKE these medications     Indication  hydrOXYzine 25 MG tablet Commonly known as: ATARAX/VISTARIL Take 1 tablet (25 mg total) by mouth every 6 (six) hours as needed for anxiety.  Indication: Feeling Anxious   nicotine 21 mg/24hr patch Commonly known as: NICODERM CQ - dosed in mg/24 hours Place 1 patch (21 mg total) onto the skin daily. Start taking on: February 12, 2019  Indication: Nicotine Addiction   traZODone 50 MG tablet Commonly known as: DESYREL Take 1 tablet (50 mg total) by mouth at bedtime as needed for sleep.  Indication: Trouble Sleeping      Follow-up Information    [facility] Follow up on 02/18/2019.   Why:  Telephonic hospital follow up appointment is Monday, 6/22 at 9:30a.  The provider will contact you the day of the appointment.  Contact information: [address] [place] [address] [phone]           Follow-up recommendations: Activity as tolerated. Diet as recommended by primary care physician. Keep all scheduled follow-up appointments as recommended.   Comments:   Patient is instructed to take all prescribed medications as recommended. Report any side effects or adverse reactions to your outpatient psychiatrist. Patient is instructed to abstain from alcohol and illegal drugs while on prescription medications. In the event of worsening symptoms, patient is instructed to call the crisis hotline, 911, or go to the nearest emergency department for evaluation and treatment.  Signed: Connye Burkitt, NP 02/11/2019, 1:01 PM   Patient seen, Suicide Assessment Completed.  Disposition Plan Reviewed

## 2019-02-21 ENCOUNTER — Encounter (HOSPITAL_COMMUNITY): Payer: Self-pay | Admitting: Emergency Medicine

## 2019-02-21 ENCOUNTER — Other Ambulatory Visit: Payer: Self-pay

## 2019-02-21 ENCOUNTER — Emergency Department (HOSPITAL_COMMUNITY)
Admission: EM | Admit: 2019-02-21 | Discharge: 2019-02-22 | Disposition: A | Discharge status: Home / Self Care | Payer: Self-pay | Attending: Emergency Medicine | Admitting: Emergency Medicine

## 2019-02-21 DIAGNOSIS — F17210 Nicotine dependence, cigarettes, uncomplicated: Secondary | ICD-10-CM | POA: Insufficient documentation

## 2019-02-21 DIAGNOSIS — R45851 Suicidal ideations: Secondary | ICD-10-CM | POA: Insufficient documentation

## 2019-02-21 DIAGNOSIS — F1124 Opioid dependence with opioid-induced mood disorder: Secondary | ICD-10-CM | POA: Diagnosis present

## 2019-02-21 DIAGNOSIS — Z590 Homelessness: Secondary | ICD-10-CM | POA: Insufficient documentation

## 2019-02-21 DIAGNOSIS — F332 Major depressive disorder, recurrent severe without psychotic features: Secondary | ICD-10-CM | POA: Insufficient documentation

## 2019-02-21 DIAGNOSIS — Z79899 Other long term (current) drug therapy: Secondary | ICD-10-CM | POA: Insufficient documentation

## 2019-02-21 LAB — CBC
HCT: 38 % — ABNORMAL LOW (ref 39.0–52.0)
Hemoglobin: 12.3 g/dL — ABNORMAL LOW (ref 13.0–17.0)
MCH: 27.7 pg (ref 26.0–34.0)
MCHC: 32.4 g/dL (ref 30.0–36.0)
MCV: 85.6 fL (ref 80.0–100.0)
Platelets: 231 10*3/uL (ref 150–400)
RBC: 4.44 MIL/uL (ref 4.22–5.81)
RDW: 13.2 % (ref 11.5–15.5)
WBC: 12.5 10*3/uL — ABNORMAL HIGH (ref 4.0–10.5)
nRBC: 0 % (ref 0.0–0.2)

## 2019-02-21 LAB — COMPREHENSIVE METABOLIC PANEL
ALT: 23 U/L (ref 0–44)
AST: 27 U/L (ref 15–41)
Albumin: 3.5 g/dL (ref 3.5–5.0)
Alkaline Phosphatase: 72 U/L (ref 38–126)
Anion gap: 10 (ref 5–15)
BUN: 8 mg/dL (ref 6–20)
CO2: 20 mmol/L — ABNORMAL LOW (ref 22–32)
Calcium: 8.3 mg/dL — ABNORMAL LOW (ref 8.9–10.3)
Chloride: 106 mmol/L (ref 98–111)
Creatinine, Ser: 1.12 mg/dL (ref 0.61–1.24)
GFR calc Af Amer: >60 mL/min (ref >60)
GFR calc non Af Amer: >60 mL/min (ref >60)
Glucose, Bld: 109 mg/dL — ABNORMAL HIGH (ref 70–99)
Potassium: 3.3 mmol/L — ABNORMAL LOW (ref 3.5–5.1)
Sodium: 136 mmol/L (ref 135–145)
Total Bilirubin: 0.8 mg/dL (ref 0.3–1.2)
Total Protein: 7.2 g/dL (ref 6.5–8.1)

## 2019-02-21 LAB — RAPID URINE DRUG SCREEN, HOSP PERFORMED
Amphetamines: NOT DETECTED
Barbiturates: NOT DETECTED
Benzodiazepines: NOT DETECTED
Cocaine: NOT DETECTED
Opiates: NOT DETECTED
Tetrahydrocannabinol: POSITIVE — AB

## 2019-02-21 LAB — SALICYLATE LEVEL: Salicylate Lvl: <7 mg/dL (ref 2.8–30.0)

## 2019-02-21 LAB — ACETAMINOPHEN LEVEL: Acetaminophen (Tylenol), Serum: <10 ug/mL — ABNORMAL LOW (ref 10–30)

## 2019-02-21 LAB — ETHANOL: Alcohol, Ethyl (B): <10 mg/dL (ref <10)

## 2019-02-21 NOTE — ED Notes (Signed)
Pt in [place] hallway for psych hold

## 2019-02-21 NOTE — ED Provider Notes (Signed)
[facility] Provider Note   CSN: [id number] Arrival date & time: 02/21/19  2217     History   Chief Complaint Chief Complaint  Patient presents with  . Suicidal    HPI Janene MadeiraKevin Hauth is a 33 y.o. male.     The history is provided by the patient and medical records.     33 year old male with history of chronic back pain, presenting to the ED with suicidal ideation.  He states for the past several days he has been feeling very down and has been having some ruminating suicidal thoughts.  States he had a plan to jump off of a bridge.  He dealt with this earlier in the month and called the suicide hotline but was directed here so he just decided to come here today for help as thoughts were very overwhelming.  He has not made any attempts at self-harm.  He denies any homicidal ideation.  No hallucinations.  He does report ongoing heroin use, last use was earlier today.  He denies any physical complaints.  He has not had any sick contacts or known COVID exposures.  He is not currently seeing a psychiatrist or therapist.  He states he was prescribed Vistaril but all it does is make him sleepy.  Past Medical History:  Diagnosis Date  . Back pain     Patient Active Problem List   Diagnosis Date Noted  . Opioid dependence with opioid-induced mood disorder (HCC)   . MDD (major depressive disorder), severe (HCC) 02/07/2019    History reviewed. No pertinent surgical history.      Home Medications    Prior to Admission medications   Medication Sig Start Date End Date Taking? Authorizing Provider  hydrOXYzine (ATARAX/VISTARIL) 25 MG tablet Take 1 tablet (25 mg total) by mouth every 6 (six) hours as needed for anxiety. 02/11/19   Aldean BakerSykes, Janet E, NP  nicotine (NICODERM CQ - DOSED IN MG/24 HOURS) 21 mg/24hr patch Place 1 patch (21 mg total) onto the skin daily. 02/12/19   Aldean BakerSykes, Janet E, NP  traZODone (DESYREL) 50 MG tablet Take 1 tablet (50 mg total)  by mouth at bedtime as needed for sleep. 02/11/19   Aldean BakerSykes, Janet E, NP    Family History No family history on file.  Social History Social History   Tobacco Use  . Smoking status: Current Some Day Smoker  . Smokeless tobacco: Never Used  Substance Use Topics  . Alcohol use: No  . Drug use: Yes    Types: Cocaine, Marijuana, Heroin     Allergies   Vicodin [hydrocodone-acetaminophen], Codeine, and Tramadol   Review of Systems Review of Systems  Psychiatric/Behavioral: Positive for suicidal ideas.  All other systems reviewed and are negative.    Physical Exam Updated Vital Signs BP (!) 103/92 (BP Location: Right Arm)   Pulse (!) 117   Temp 98.7 F (37.1 C) (Oral)   Resp 18   SpO2 99%   Physical Exam Vitals signs and nursing note reviewed.  Constitutional:      Appearance: He is well-developed.  HENT:     Head: Normocephalic and atraumatic.  Eyes:     Conjunctiva/sclera: Conjunctivae normal.     Pupils: Pupils are equal, round, and reactive to light.  Neck:     Musculoskeletal: Normal range of motion.  Cardiovascular:     Rate and Rhythm: Normal rate and regular rhythm.     Heart sounds: Normal heart sounds.  Pulmonary:  Effort: Pulmonary effort is normal.     Breath sounds: Normal breath sounds.  Abdominal:     General: Bowel sounds are normal.     Palpations: Abdomen is soft.  Musculoskeletal: Normal range of motion.  Skin:    General: Skin is warm and dry.  Neurological:     Mental Status: He is alert and oriented to person, place, and time.  Psychiatric:     Comments: Appears depressed, looking at the floor during exam, limited eye contact SI with plan to jump off bridge Denies HI/AVH      ED Treatments / Results  Labs (all labs ordered are listed, but only abnormal results are displayed) Labs Reviewed  COMPREHENSIVE METABOLIC PANEL - Abnormal; Notable for the following components:      Result Value   Potassium 3.3 (*)    CO2 20 (*)     Glucose, Bld 109 (*)    Calcium 8.3 (*)    All other components within normal limits  ACETAMINOPHEN LEVEL - Abnormal; Notable for the following components:   Acetaminophen (Tylenol), Serum <10 (*)    All other components within normal limits  CBC - Abnormal; Notable for the following components:   WBC 12.5 (*)    Hemoglobin 12.3 (*)    HCT 38.0 (*)    All other components within normal limits  RAPID URINE DRUG SCREEN, HOSP PERFORMED - Abnormal; Notable for the following components:   Tetrahydrocannabinol POSITIVE (*)    All other components within normal limits  ETHANOL  SALICYLATE LEVEL    EKG    Radiology No results found.  Procedures Procedures (including critical care time)  Medications Ordered in ED Medications - No data to display   Initial Impression / Assessment and Plan / ED Course  I have reviewed the triage vital signs and the nursing notes.  Pertinent labs & imaging results that were available during my care of the patient were reviewed by me and considered in my medical decision making (see chart for details).  33 year old male here with suicidal ideation.  He has thoughts of jumping off a bridge.  Was previously prescribed Vistaril but states "it just made me sleepy".  He denies any homicidal ideation.  No hallucinations.  Ongoing heroin abuse, last use earlier today.  Screening labs are overall reassuring.  UDS is positive for THC.  Patient medically cleared.  TTS has evaluated, recommends overnight observation and reassessment in the morning.  Final Clinical Impressions(s) / ED Diagnoses   Final diagnoses:  Suicidal ideation    ED Discharge Orders    None       Larene Pickett, PA-C 02/22/19 0410    Varney Biles, MD 02/22/19 [phone]

## 2019-02-21 NOTE — ED Triage Notes (Signed)
Pt reports feeling suicidal and thought about jumping off a bridge tonight. Reports that he is homeless and lost all his prescriptions.

## 2019-02-22 DIAGNOSIS — F1124 Opioid dependence with opioid-induced mood disorder: Secondary | ICD-10-CM

## 2019-02-22 DIAGNOSIS — R45851 Suicidal ideations: Secondary | ICD-10-CM

## 2019-02-22 DIAGNOSIS — Z590 Homelessness: Secondary | ICD-10-CM

## 2019-02-22 NOTE — BH Assessment (Addendum)
Tele Assessment Note   Patient Name: Jon MadeiraKevin Castro MRN: [id number] Referring Physician: Sharilyn SitesLisa Sanders, PA-C. [facility], [phone] [id number]. Location of Provider: Behavioral Health TTS Department  Jon MadeiraKevin Castro is an 33 y.o. male, who [facility] and [facility]. Clinician asked the pt, "what brought you to the hospital?" Pt reported, he's been feeling like he wanted to hurt himself for about a week. Pt reported, his stressors are due to his drug use and homelessness. Pt reported, he has been homeless for about two months because he lost his place to stay. Pt reported, he thought about jumping off a bridge. Pt described his depressive symptoms as: sadness. Pt denies, HI, AVH, self-injurious and access to weapons.  Pt denies history of abuse. Pt reported, smoking a half of pack of cigerettes, daily. Pt denies use, pt's UDS is positive of rmarijuana. Pt reported daily heroin use. Pt reported, he used (IV) today around 5pm. Pt denies, being linked to OPT resources (medication management and/or counseling.)  Pt reported, [facility] for [facility] rehab program (pt use how long he as there).  Pt presents quiet, awake in scrubs with logical, coherent speech. Pt's eye contact was fair. Pt's mood was pleasant. Pt's affect was blunted. Pt's thought process was coherent, relevant. Pt's judgement was partial. Pt's was oriented x4. Pt's concentration was normal. Pt's insight, impulse control was fair. Pt reported, [facility] he could not contract for safety (try to hurt self). Pt reported, if inpatient treatment is recommended he would sign-in voluntarily. Pt declined when asked if clinician could contact family, friend supports to obtain collateral information.   Diagnosis: Major Depressive Disorder, recurrent, severe without psychotic.                     Opioid use Disorder, severe.   Past Medical History:  Past Medical History:  Diagnosis Date  . Back pain      History reviewed. No pertinent surgical history.  Family History: No family history on file.  Social History:  reports that he has been smoking. He has never used smokeless tobacco. He reports current drug use. Drugs: Cocaine, Marijuana, and Heroin. He reports that he does not drink alcohol.  Additional Social History:  Alcohol / Drug Use Pain Medications: See MAR Prescriptions: See MAR Over the Counter: See MAR History of alcohol / drug use?: Yes Negative Consequences of Use: Financial Substance #1 Name of Substance 1: Cigarettes. 1 - Age of First Use: UTA 1 - Amount (size/oz): Pt reported, smoking a half of pack of cigerettes, daily. 1 - Frequency: Daily. 1 - Duration: Ongoing. 1 - Last Use / Amount: Daily. Substance #2 Name of Substance 2: Marijuana. 2 - Age of First Use: UTA 2 - Amount (size/oz): Pt denies use. Pt's UDS is positive of rmarijuana. 2 - Frequency: UTA 2 - Duration: UTA 2 - Last Use / Amount: UTA Substance #3 Name of Substance 3: Heroin. 3 - Age of First Use: UTA 3 - Amount (size/oz): UTA 3 - Frequency: Daily. 3 - Duration: Ongoing. 3 - Last Use / Amount: Daily.  CIWA: CIWA-Ar BP: (!) 103/92 Pulse Rate: (!) 117 COWS:    Allergies:  Allergies  Allergen Reactions  . Vicodin [Hydrocodone-Acetaminophen] Itching  . Codeine Nausea And Vomiting  . Tramadol Other (See Comments)    "messes with head"    Home Medications: (Not in a hospital admission)   OB/GYN Status:  No LMP for male  patient.  General Assessment Data [facility] Assessment[facility] TTS Assessment: In system Is this a Tele or Face-to-Face Assessment?: Tele Assessment Is this an Initial Assessment or a Re-assessment for this encounter?: Initial Assessment Patient Accompanied by:: N/A Language Other than English: No Living Arrangements: Homeless/Shelter Marital status: Single Living Arrangements: Other (Comment)(Homeless. ) Can pt return to current living arrangement?:  Yes Admission Status: Voluntary Is patient capable of signing voluntary admission?: Yes Referral Source: Self/Family/Friend Insurance type: Self-pay.      Crisis Care Plan Living Arrangements: Other (Comment)(Homeless. ) Legal Guardian: Other:(Self. ) Name of Psychiatrist: NA Name of Therapist: NA  Education Status Is patient currently in school?: No Highest grade of school patient has completed: Per chart, graduated high school.  Is the patient employed, unemployed or receiving disability?: Unemployed  Risk to self with the past 6 months Suicidal Ideation: Yes-Currently Present Has patient been a risk to self within the past 6 months prior to admission? : Yes Suicidal Intent: Yes-Currently Present Has patient had any suicidal intent within the past 6 months prior to admission? : Yes Is patient at risk for suicide?: Yes Suicidal Plan?: Yes-Currently Present Has patient had any suicidal plan within the past 6 months prior to admission? : Yes Specify Current Suicidal Plan: Pt reported, jumpin off of a bridge.  Access to Means: Yes Specify Access to Suicidal Means: Pt has access to bridges.  What has been your use of drugs/alcohol within the last 12 months?: Marijuana and heroin.  Previous Attempts/Gestures: No How many times?: 0 Other Self Harm Risks: Drug use.  Triggers for Past Attempts: None known Intentional Self Injurious Behavior: None(Pt denies. ) Family Suicide History: Unknown Recent stressful life event(s): Other (Comment)(Drug use and homelessness. ) Persecutory voices/beliefs?: No Depression: Yes Depression Symptoms: (sadness ) Substance abuse history and/or treatment for substance abuse?: Yes Suicide prevention information given to non-admitted patients: Not applicable  Risk to Others within the past 6 months Homicidal Ideation: No(Pt denies. ) Does patient have any lifetime risk of violence toward others beyond the six months prior to admission? : No(Pt  denies. ) Thoughts of Harm to Others: No(Pt denies. ) Current Homicidal Intent: No Current Homicidal Plan: No(Pt denies. ) Access to Homicidal Means: No Identified Victim: NA History of harm to others?: No(Pt denies. ) Assessment of Violence: None Noted Violent Behavior Description: NA Does patient have access to weapons?: No(Pt denies. ) Criminal Charges Pending?: No Does patient have a court date: No Is patient on probation?: No  Psychosis Hallucinations: None noted Delusions: None noted  Mental Status Report Appearance/Hygiene: In scrubs Eye Contact: Fair Motor Activity: Unremarkable Speech: Logical/coherent Level of Consciousness: Quiet/awake Mood: Pleasant Affect: Blunted Anxiety Level: None Thought Processes: Coherent, Relevant Judgement: Partial Orientation: Person, Place, Time, Situation Obsessive Compulsive Thoughts/Behaviors: None  Cognitive Functioning Concentration: Normal Memory: Recent Intact Is patient IDD: No Insight: Fair Impulse Control: Fair Appetite: Fair Have you had any weight changes? : No Change Sleep: Decreased Total Hours of Sleep: (sleep up and down.) Vegetative Symptoms: None  ADLScreening [facility] Assessment Services) Patient's cognitive ability adequate to safely complete daily activities?: Yes Patient able to express need for assistance with ADLs?: Yes Independently performs ADLs?: Yes (appropriate for developmental age)  Prior Inpatient Therapy Prior Inpatient Therapy: Yes Prior Therapy Dates: 2014 Prior Therapy Facilty/Provider(s): [facility]. Reason for Treatment: Heroin use.   Prior Outpatient Therapy Prior Outpatient Therapy: No Prior Therapy Dates: Per chart, 2016. Prior Therapy Facilty/Provider(s): Per chart, [facility]. Reason for Treatment: Per  chart, Methadone management. Does patient have Intensive In-House Services?  : No Does patient have [facility] services? : No Does patient have P4CC services?: No  ADL Screening  (condition at time of admission) Patient's cognitive ability adequate to safely complete daily activities?: Yes Is the patient deaf or have difficulty hearing?: No Does the patient have difficulty seeing, even when wearing glasses/contacts?: No Does the patient have difficulty concentrating, remembering, or making decisions?: No Patient able to express need for assistance with ADLs?: Yes Does the patient have difficulty dressing or bathing?: No Independently performs ADLs?: Yes (appropriate for developmental age) Does the patient have difficulty walking or climbing stairs?: No Weakness of Legs: None Weakness of Arms/Hands: None  Home Assistive Devices/Equipment Home Assistive Devices/Equipment: None    Abuse/Neglect Assessment (Assessment to be complete while patient is alone) Abuse/Neglect Assessment Can Be Completed: Yes Physical Abuse: Denies(Pt denies.) Verbal Abuse: Denies(Pt denies.) Sexual Abuse: Denies(Pt denies.) Exploitation of patient/patient's resources: Denies(Pt denies.) Self-Neglect: Denies(Pt denies.)     Advance Directives (For Healthcare) Does Patient Have a Medical Advance Directive?: No          Disposition: Nira ConnJason Berry, NP recommended observation and reassessment by psychiatry. Discussed with Dorathy DaftKayla, RN. RN to discusses with EDP.    Disposition Initial Assessment Completed for this Encounter: Yes  This service was provided via telemedicine using a 2-way, interactive audio and video technology.  Names of all persons participating in this telemedicine service and their role in this encounter. Name: Jon MadeiraKevin Castro. Role: Patient.   Name: Redmond Pullingreylese D Keean Wilmeth, MS, [facility], [facility]. Role: Counselor.           Redmond Pullingreylese D Akhil Piscopo 02/22/2019 1:51 AM     Redmond Pullingreylese D Hendrix Yurkovich, MS, [facility], [facility] Triage Specialist [phone]

## 2019-02-22 NOTE — Consult Note (Signed)
Telepsych Consultation   Reason for Consult:  Suicidal ideations Referring Physician:  EDP Location of Patient:  Location of Provider: Behavioral Health TTS Department  Patient Identification: Jon Castro MRN:  [id number] Principal Diagnosis: Opioid dependence with opioid-induced mood disorder (HCC) Diagnosis:  Principal Problem:   Opioid dependence with opioid-induced mood disorder (HCC)   Total Time spent with patient: 30 minutes  Subjective:   Jon Castro is a 33 y.o. male patient reports that he sometimes has suicidal thoughts because he is homeless and unemployed and nothing seems to go right.  Patient reports that he has been using heroin daily since he got out of the hospital.  Patient reports that he was in the hospital approximately 1 month ago and that was the only hospitalization that he is had.  Patient states that once at the hospital he wishes he would have asked for residential treatment because that would have meant that he got a stay in the hospital a few more days.  Patient did reported he was homeless and he stated that he had nowhere else to go so he came back to the hospital hoping to get into residential treatment.  HPI:  33 y.o. male, who presents voluntary and unaccompanied to [facility]. Clinician asked the pt, "what brought you to the hospital?" Pt reported, he's been feeling like he wanted to hurt himself for about a week. Pt reported, his stressors are due to his drug use and homelessness. Pt reported, he has been homeless for about two months because he lost his place to stay. Pt reported, he thought about jumping off a bridge. Pt described his depressive symptoms as: sadness. Pt denies, HI, AVH, self-injurious and access to weapons. Pt denies history of abuse. Pt reported, smoking a half of pack of cigerettes, daily. Pt denies use, pt's UDS is positive of rmarijuana. Pt reported daily heroin use. Pt reported, he used (IV) today around 5pm. Pt denies, being linked to  OPT resources (medication management and/or counseling.)  Pt reported, he was at [facility] in 2014 for the rehab program (pt use how long he as there). Pt presents quiet, awake in scrubs with logical, coherent speech. Pt's eye contact was fair. Pt's mood was pleasant. Pt's affect was blunted. Pt's thought process was coherent, relevant. Pt's judgement was partial. Pt's was oriented x4. Pt's concentration was normal. Pt's insight, impulse control was fair. Pt reported, if discharged from [facility] he could not contract for safety (try to hurt self). Pt reported, if inpatient treatment is recommended he would sign-in voluntarily. Pt declined when asked if clinician could contact family, friend supports to obtain collateral information.   Patient seen by me via tele-psych and have consulted with Dr. Lucianne Muss.  At this time patient does not meet for inpatient criteria and is psychiatrically cleared.  Patient commented that he came back to the hospital with the intent of getting into residential rehab and that he was looking for a place to stay for a few days.  Patient was informed of the resources for residential rehab facilities.  Patient stated that he was not on any medications when he left the hospital in June, 2020.  Patient denies any suicidal or homicidal ideations when informed that he will be discharging.  I have contacted Alex law and notified her of the recommendations.  Past Psychiatric History: MDD, Opiate dependence, Multiple ED visits, 1 previous hospitalization at [facility] on 02/07/19  Risk to Self: Suicidal Ideation: Yes-Currently Present Suicidal Intent: Yes-Currently Present Is patient at risk for  suicide?: Yes Suicidal Plan?: Yes-Currently Present Specify Current Suicidal Plan: Pt reported, jumpin off of a bridge.  Access to Means: Yes Specify Access to Suicidal Means: Pt has access to bridges.  What has been your use of drugs/alcohol within the last 12 months?: Marijuana and heroin.  How many times?:  0 Other Self Harm Risks: Drug use.  Triggers for Past Attempts: None known Intentional Self Injurious Behavior: None(Pt denies. ) Risk to Others: Homicidal Ideation: No(Pt denies. ) Thoughts of Harm to Others: No(Pt denies. ) Current Homicidal Intent: No Current Homicidal Plan: No(Pt denies. ) Access to Homicidal Means: No Identified Victim: NA History of harm to others?: No(Pt denies. ) Assessment of Violence: None Noted Violent Behavior Description: NA Does patient have access to weapons?: No(Pt denies. ) Criminal Charges Pending?: No Does patient have a court date: No Prior Inpatient Therapy: Prior Inpatient Therapy: Yes Prior Therapy Dates: 2014 Prior Therapy Facilty/Provider(s): [facility]. Reason for Treatment: Heroin use.  Prior Outpatient Therapy: Prior Outpatient Therapy: No Prior Therapy Dates: Per chart, 2016. Prior Therapy Facilty/Provider(s): Per chart, [facility]. Reason for Treatment: Per chart, Methadone management. Does patient have Intensive In-House Services?  : No Does patient have [facility] services? : No Does patient have P4CC services?: No  Past Medical History:  Past Medical History:  Diagnosis Date  . Back pain    History reviewed. No pertinent surgical history. Family History: No family history on file. Family Psychiatric  History: Father abused alcohol, no other family psych history Social History:  Social History   Substance and Sexual Activity  Alcohol Use No     Social History   Substance and Sexual Activity  Drug Use Yes  . Types: Cocaine, Marijuana, Heroin    Social History   Socioeconomic History  . Marital status: Single    Spouse name: Not on file  . Number of children: Not on file  . Years of education: Not on file  . Highest education level: Not on file  Occupational History  . Not on file  Social Needs  . Financial resource strain: Not on file  . Food insecurity    Worry: Not on file    Inability: Not on file  .  Transportation needs    Medical: Not on file    Non-medical: Not on file  Tobacco Use  . Smoking status: Current Some Day Smoker  . Smokeless tobacco: Never Used  Substance and Sexual Activity  . Alcohol use: No  . Drug use: Yes    Types: Cocaine, Marijuana, Heroin  . Sexual activity: Not on file  Lifestyle  . Physical activity    Days per week: Not on file    Minutes per session: Not on file  . Stress: Not on file  Relationships  . Social Musicianconnections    Talks on phone: Not on file    Gets together: Not on file    Attends religious service: Not on file    Active member of club or organization: Not on file    Attends meetings of clubs or organizations: Not on file    Relationship status: Not on file  Other Topics Concern  . Not on file  Social History Narrative  . Not on file   Additional Social History:    Allergies:   Allergies  Allergen Reactions  . Vicodin [Hydrocodone-Acetaminophen] Itching  . Codeine Nausea And Vomiting  . Tramadol Other (See Comments)    "messes with head"    Labs:  Results for  orders placed or performed during the hospital encounter of 02/21/19 (from the past 48 hour(s))  Rapid urine drug screen (hospital performed)     Status: Abnormal   Collection Time: 02/21/19 10:44 PM  Result Value Ref Range   Opiates NONE DETECTED NONE DETECTED   Cocaine NONE DETECTED NONE DETECTED   Benzodiazepines NONE DETECTED NONE DETECTED   Amphetamines NONE DETECTED NONE DETECTED   Tetrahydrocannabinol POSITIVE (A) NONE DETECTED   Barbiturates NONE DETECTED NONE DETECTED    Comment: (NOTE) DRUG SCREEN FOR MEDICAL PURPOSES ONLY.  IF CONFIRMATION IS NEEDED FOR ANY PURPOSE, NOTIFY LAB WITHIN 5 DAYS. LOWEST DETECTABLE LIMITS FOR URINE DRUG SCREEN Drug Class                     Cutoff (ng/mL) Amphetamine and metabolites    1000 Barbiturate and metabolites    200 Benzodiazepine                 382 Tricyclics and metabolites     300 Opiates and metabolites         300 Cocaine and metabolites        300 THC                            50 Performed at [facility], [place] [address] [place], [place] [address]   Comprehensive metabolic panel     Status: Abnormal   Collection Time: 02/21/19 10:46 PM  Result Value Ref Range   Sodium 136 135 - 145 mmol/L   Potassium 3.3 (L) 3.5 - 5.1 mmol/L   Chloride 106 98 - 111 mmol/L   CO2 20 (L) 22 - 32 mmol/L   Glucose, Bld 109 (H) 70 - 99 mg/dL   BUN 8 6 - 20 mg/dL   Creatinine, Ser 1.12 0.61 - 1.24 mg/dL   Calcium 8.3 (L) 8.9 - 10.3 mg/dL   Total Protein 7.2 6.5 - 8.1 g/dL   Albumin 3.5 3.5 - 5.0 g/dL   AST 27 15 - 41 U/L   ALT 23 0 - 44 U/L   Alkaline Phosphatase 72 38 - 126 U/L   Total Bilirubin 0.8 0.3 - 1.2 mg/dL   GFR calc non Af Amer >60 >60 mL/min   GFR calc Af Amer >60 >60 mL/min   Anion gap 10 5 - 15    Comment: Performed at [facility], [place] [address], [place], [place] [address]  Ethanol     Status: None   Collection Time: 02/21/19 10:46 PM  Result Value Ref Range   Alcohol, Ethyl (B) <10 <10 mg/dL    Comment: (NOTE) Lowest detectable limit for serum alcohol is 10 mg/dL. For medical purposes only. Performed at [facility], [place] [address], [place], [place] [address]   Salicylate level     Status: None   Collection Time: 02/21/19 10:46 PM  Result Value Ref Range   Salicylate Lvl <0.2 2.8 - 30.0 mg/dL    Comment: Performed at [place] [address], [place], [place] [address]  Acetaminophen level     Status: Abnormal   Collection Time: 02/21/19 10:46 PM  Result Value Ref Range   Acetaminophen (Tylenol), Serum <10 (L) 10 - 30 ug/mL    Comment: (NOTE) Therapeutic concentrations vary significantly. A range of 10-30 ug/mL  may be an effective concentration for many patients. However, some  are best treated at concentrations  outside of this range. Acetaminophen concentrations >150 ug/mL at 4 hours after ingestion  and >50 ug/mL at 12 hours after ingestion  are often associated with  toxic reactions. Performed at [facility], [address] [place], [place] [address]   cbc     Status: Abnormal   Collection Time: 02/21/19 10:46 PM  Result Value Ref Range   WBC 12.5 (H) 4.0 - 10.5 K/uL   RBC 4.44 4.22 - 5.81 MIL/uL   Hemoglobin 12.3 (L) 13.0 - 17.0 g/dL   HCT 60.438.0 (L) 54.039.0 - 98.152.0 %   MCV 85.6 80.0 - 100.0 fL   MCH 27.7 26.0 - 34.0 pg   MCHC 32.4 30.0 - 36.0 g/dL   RDW 19.113.2 47.811.5 - 29.515.5 %   Platelets 231 150 - 400 K/uL   nRBC 0.0 0.0 - 0.2 %    Comment: Performed at [facility], [address] [place], [place] [address]    Medications:  No current facility-administered medications for this encounter.    Current Outpatient Medications  Medication Sig Dispense Refill  . hydrOXYzine (ATARAX/VISTARIL) 25 MG tablet Take 1 tablet (25 mg total) by mouth every 6 (six) hours as needed for anxiety. (Patient not taking: Reported on 02/21/2019) 30 tablet 0  . nicotine (NICODERM CQ - DOSED IN MG/24 HOURS) 21 mg/24hr patch Place 1 patch (21 mg total) onto the skin daily. (Patient not taking: Reported on 02/21/2019) 28 patch 0  . traZODone (DESYREL) 50 MG tablet Take 1 tablet (50 mg total) by mouth at bedtime as needed for sleep. (Patient not taking: Reported on 02/21/2019) 30 tablet 0    Musculoskeletal: Strength & Muscle Tone: within normal limits Gait & Station: normal Patient leans: N/A  Psychiatric Specialty Exam: Physical Exam  Nursing note and vitals reviewed. Constitutional: He is oriented to person, place, and time. He appears well-developed and well-nourished.  Cardiovascular: Normal rate.  Respiratory: Effort normal.  Musculoskeletal: Normal range of motion.  Neurological: He is alert and oriented to person, place, and time.  Skin: Skin is warm.    Review of Systems  Constitutional: Negative.   HENT: Negative.   Eyes: Negative.   Respiratory: Negative.   Cardiovascular: Negative.   Gastrointestinal: Negative.    Genitourinary: Negative.   Musculoskeletal: Negative.   Skin: Negative.   Neurological: Negative.   Endo/Heme/Allergies: Negative.   Psychiatric/Behavioral: Positive for substance abuse. Suicidal ideas: Vague     Blood pressure 106/61, pulse 75, temperature 98 F (36.7 C), temperature source Oral, resp. rate 20, SpO2 100 %.There is no height or weight on file to calculate BMI.  General Appearance: Disheveled  Eye Contact:  Good  Speech:  Clear and Coherent and Normal Rate  Volume:  Normal  Mood:  Anxious  Affect:  Congruent  Thought Process:  Coherent and Descriptions of Associations: Intact  Orientation:  Full (Time, Place, and Person)  Thought Content:  WDL  Suicidal Thoughts:  SI sometimes and vague responses  Homicidal Thoughts:  No  Memory:  Immediate;   Good Recent;   Good Remote;   Good  Judgement:  Fair  Insight:  Fair  Psychomotor Activity:  Normal  Concentration:  Concentration: Good and Attention Span: Good  Recall:  Good  Fund of Knowledge:  Good  Language:  Good  Akathisia:  No  Handed:  Right  AIMS (if indicated):     Assets:  Communication Skills Desire for Improvement Physical Health Resilience Social Support Transportation  ADL's:  Intact  Cognition:  WNL  Sleep:        Treatment Plan Summary: Follow up with outpatient provider  Seek residential rehab through outpatient resources  Disposition: No evidence of imminent risk to self or others at present.   Patient does not meet criteria for psychiatric inpatient admission. Supportive therapy provided about ongoing stressors. Discussed crisis plan, support from social network, calling 911, coming to the Emergency Department, and calling Suicide Hotline.  This service was provided via telemedicine using a 2-way, interactive audio and video technology.  Names of all persons participating in this telemedicine service and their role in this encounter. Name: Jon Castro Role: Patient  Name:  Reola Calkinsravis Money NP Role: Provider  Name:  Role:   Name:  Role:     Maryfrances Bunnellravis B Money, FNP 02/22/2019 2:25 PM

## 2019-02-22 NOTE — ED Notes (Signed)
Per TTS pt to be reassessed in the am.

## 2019-02-22 NOTE — ED Provider Notes (Signed)
Patient evaluated by Marvia Pickles, FNP with psychiatry.  He has psychiatrically cleared the patient for discharge home with outpatient resources.  Patient has declined medications in his previous behavioral health admissions this past month.  Patient denies any SI or HI for me and states that he is just sleeping, when I went to evaluate him.  Patient will be discharged with outpatient resources.  Return precautions discussed.   Frederica Kuster, PA-C 02/22/19 1458    Milton Ferguson, MD 02/26/19 1253

## 2019-02-22 NOTE — ED Notes (Addendum)
[facility] from a stretcher.

## 2019-02-22 NOTE — Progress Notes (Signed)
Note written in error  Romie Minus T. Judi Cong, MSW, [facility] Social Work [phone] (cell) [phone] (office)

## 2019-02-22 NOTE — ED Notes (Signed)
Patient was given a snack and drink. A Diet was ordered for Lunch. 

## 2019-02-22 NOTE — ED Notes (Signed)
Diet was ordered for Breakfast. 

## 2019-02-22 NOTE — Discharge Instructions (Signed)
Please return to your closest emergency department if you develop any new or worsening symptoms including plan of suicide, hurting or killing anyone else, or any other new or concerning symptoms.

## 2019-02-22 NOTE — ED Notes (Signed)
TTS in progress 

## 2019-03-05 DIAGNOSIS — F1120 Opioid dependence, uncomplicated: Secondary | ICD-10-CM | POA: Diagnosis present

## 2019-03-08 ENCOUNTER — Encounter (HOSPITAL_COMMUNITY): Payer: Self-pay

## 2019-03-08 ENCOUNTER — Emergency Department (HOSPITAL_COMMUNITY)
Admission: EM | Admit: 2019-03-08 | Discharge: 2019-03-08 | Disposition: A | Discharge status: Home / Self Care | Payer: Self-pay | Attending: Emergency Medicine | Admitting: Emergency Medicine

## 2019-03-08 DIAGNOSIS — F1110 Opioid abuse, uncomplicated: Secondary | ICD-10-CM | POA: Insufficient documentation

## 2019-03-08 DIAGNOSIS — Z720 Tobacco use: Secondary | ICD-10-CM | POA: Insufficient documentation

## 2019-03-08 DIAGNOSIS — F1290 Cannabis use, unspecified, uncomplicated: Secondary | ICD-10-CM | POA: Insufficient documentation

## 2019-03-08 LAB — COMPREHENSIVE METABOLIC PANEL
ALT: 40 U/L (ref 0–44)
AST: 27 U/L (ref 15–41)
Albumin: 3.4 g/dL — ABNORMAL LOW (ref 3.5–5.0)
Alkaline Phosphatase: 71 U/L (ref 38–126)
Anion gap: 10 (ref 5–15)
BUN: 9 mg/dL (ref 6–20)
CO2: 22 mmol/L (ref 22–32)
Calcium: 9 mg/dL (ref 8.9–10.3)
Chloride: 108 mmol/L (ref 98–111)
Creatinine, Ser: 1.12 mg/dL (ref 0.61–1.24)
GFR calc Af Amer: >60 mL/min (ref >60)
GFR calc non Af Amer: >60 mL/min (ref >60)
Glucose, Bld: 133 mg/dL — ABNORMAL HIGH (ref 70–99)
Potassium: 3.9 mmol/L (ref 3.5–5.1)
Sodium: 140 mmol/L (ref 135–145)
Total Bilirubin: 0.4 mg/dL (ref 0.3–1.2)
Total Protein: 7.4 g/dL (ref 6.5–8.1)

## 2019-03-08 LAB — SALICYLATE LEVEL: Salicylate Lvl: <7 mg/dL (ref 2.8–30.0)

## 2019-03-08 LAB — CBC
HCT: 42.3 % (ref 39.0–52.0)
Hemoglobin: 13.4 g/dL (ref 13.0–17.0)
MCH: 27.2 pg (ref 26.0–34.0)
MCHC: 31.7 g/dL (ref 30.0–36.0)
MCV: 85.8 fL (ref 80.0–100.0)
Platelets: 359 10*3/uL (ref 150–400)
RBC: 4.93 MIL/uL (ref 4.22–5.81)
RDW: 13.3 % (ref 11.5–15.5)
WBC: 14.2 10*3/uL — ABNORMAL HIGH (ref 4.0–10.5)
nRBC: 0 % (ref 0.0–0.2)

## 2019-03-08 LAB — RAPID URINE DRUG SCREEN, HOSP PERFORMED
Amphetamines: NOT DETECTED
Barbiturates: NOT DETECTED
Benzodiazepines: NOT DETECTED
Cocaine: NOT DETECTED
Opiates: NOT DETECTED
Tetrahydrocannabinol: POSITIVE — AB

## 2019-03-08 LAB — ACETAMINOPHEN LEVEL: Acetaminophen (Tylenol), Serum: <10 ug/mL — ABNORMAL LOW (ref 10–30)

## 2019-03-08 LAB — ETHANOL: Alcohol, Ethyl (B): <10 mg/dL (ref <10)

## 2019-03-08 MED ORDER — CLONIDINE HCL 0.1 MG PO TABS: 0.1000 mg | ORAL_TABLET | Freq: Three times a day (TID) | ORAL | 0 refills | Status: DC

## 2019-03-08 MED ORDER — ONDANSETRON 4 MG PO TBDP
4.0000 mg | ORAL_TABLET | Freq: Once | ORAL | Status: AC
  Administered 2019-03-08: 4 mg via ORAL
  Filled 2019-03-08: qty 1

## 2019-03-08 MED ORDER — ONDANSETRON HCL 4 MG PO TABS: 4.0000 mg | ORAL_TABLET | Freq: Four times a day (QID) | ORAL | 0 refills | Status: AC

## 2019-03-08 MED ORDER — CLONIDINE HCL 0.1 MG PO TABS
0.1000 mg | ORAL_TABLET | Freq: Once | ORAL | Status: AC
  Administered 2019-03-08: 0.1 mg via ORAL
  Filled 2019-03-08: qty 1

## 2019-03-08 NOTE — ED Notes (Signed)
Pt refused discharge signature. 

## 2019-03-08 NOTE — ED Notes (Signed)
Pt discharged with all belongings. Discharge instructions reviewed with pt, and pt verbalized understanding. Opportunity for questions provided.  

## 2019-03-08 NOTE — ED Provider Notes (Signed)
[facility]   CSN: [id number] Arrival date & time: 03/08/19  1731    History   Chief Complaint Chief Complaint  Patient presents with  . Detox    HPI Janene MadeiraKevin Deahl is a 33 y.o. male.     Patient sent for medical clearance.  Try to get into rehab for heroin abuse.  Denies any alcohol use.  Does smoke marijuana.  Has some mild nausea.  No chest pain.  The history is provided by the patient.  Illness Location:  General Severity:  Mild Onset quality:  Gradual Timing:  Intermittent Progression:  Unchanged Chronicity:  New Relieved by:  Nothing Worsened by:  Nothing Associated symptoms: no abdominal pain, no chest pain, no cough, no ear pain, no fever, no rash, no shortness of breath, no sore throat and no vomiting     Past Medical History:  Diagnosis Date  . Back pain     Patient Active Problem List   Diagnosis Date Noted  . Opioid dependence with opioid-induced mood disorder (HCC)   . MDD (major depressive disorder), severe (HCC) 02/07/2019    History reviewed. No pertinent surgical history.      Home Medications    Prior to Admission medications   Medication Sig Start Date End Date Taking? Authorizing Provider  cloNIDine (CATAPRES) 0.1 MG tablet Take 1 tablet (0.1 mg total) by mouth 3 (three) times daily for 5 days. 03/08/19 03/13/19  Verle Brillhart, DO  hydrOXYzine (ATARAX/VISTARIL) 25 MG tablet Take 1 tablet (25 mg total) by mouth every 6 (six) hours as needed for anxiety. Patient not taking: Reported on 02/21/2019 02/11/19   Aldean BakerSykes, Janet E, NP  nicotine (NICODERM CQ - DOSED IN MG/24 HOURS) 21 mg/24hr patch Place 1 patch (21 mg total) onto the skin daily. Patient not taking: Reported on 02/21/2019 02/12/19   Aldean BakerSykes, Janet E, NP  ondansetron (ZOFRAN) 4 MG tablet Take 1 tablet (4 mg total) by mouth every 6 (six) hours for 12 doses. 03/08/19 03/11/19  Rolene Andrades, DO  traZODone (DESYREL) 50 MG tablet Take 1  tablet (50 mg total) by mouth at bedtime as needed for sleep. Patient not taking: Reported on 02/21/2019 02/11/19   Aldean BakerSykes, Janet E, NP    Family History No family history on file.  Social History Social History   Tobacco Use  . Smoking status: Current Some Day Smoker  . Smokeless tobacco: Never Used  Substance Use Topics  . Alcohol use: No  . Drug use: Yes    Types: Cocaine, Marijuana, Heroin     Allergies   Vicodin [hydrocodone-acetaminophen], Codeine, and Tramadol   Review of Systems Review of Systems  Constitutional: Negative for chills and fever.  HENT: Negative for ear pain and sore throat.   Eyes: Negative for pain and visual disturbance.  Respiratory: Negative for cough and shortness of breath.   Cardiovascular: Negative for chest pain and palpitations.  Gastrointestinal: Negative for abdominal pain and vomiting.  Genitourinary: Negative for dysuria and hematuria.  Musculoskeletal: Negative for arthralgias and back pain.  Skin: Negative for color change and rash.  Neurological: Negative for seizures and syncope.  All other systems reviewed and are negative.    Physical Exam Updated Vital Signs  ED Triage Vitals  Enc Vitals Group     BP 03/08/19 1751 101/76     Pulse Rate 03/08/19 1751 (!) 117     Resp 03/08/19 1751 16     Temp 03/08/19 1751 98.8 F (37.1  C)     Temp Source 03/08/19 1751 Oral     SpO2 03/08/19 1751 97 %     Weight --      Height --      Head Circumference --      Peak Flow --      Pain Score 03/08/19 1749 0     Pain Loc --      Pain Edu? --      Excl. in GC? --     Physical Exam Vitals signs and nursing note reviewed.  Constitutional:      Appearance: He is well-developed.  HENT:     Head: Normocephalic and atraumatic.     Mouth/Throat:     Mouth: Mucous membranes are moist.  Eyes:     Extraocular Movements: Extraocular movements intact.     Conjunctiva/sclera: Conjunctivae normal.     Pupils: Pupils are equal, round, and  reactive to light.  Neck:     Musculoskeletal: Normal range of motion and neck supple.  Cardiovascular:     Rate and Rhythm: Normal rate and regular rhythm.     Pulses: Normal pulses.     Heart sounds: Normal heart sounds. No murmur.  Pulmonary:     Effort: Pulmonary effort is normal. No respiratory distress.     Breath sounds: Normal breath sounds.  Abdominal:     Palpations: Abdomen is soft.     Tenderness: There is no abdominal tenderness.  Skin:    General: Skin is warm and dry.  Neurological:     General: No focal deficit present.     Mental Status: He is alert.      ED Treatments / Results  Labs (all labs ordered are listed, but only abnormal results are displayed) Labs Reviewed  COMPREHENSIVE METABOLIC PANEL - Abnormal; Notable for the following components:      Result Value   Glucose, Bld 133 (*)    Albumin 3.4 (*)    All other components within normal limits  ACETAMINOPHEN LEVEL - Abnormal; Notable for the following components:   Acetaminophen (Tylenol), Serum <10 (*)    All other components within normal limits  CBC - Abnormal; Notable for the following components:   WBC 14.2 (*)    All other components within normal limits  RAPID URINE DRUG SCREEN, HOSP PERFORMED - Abnormal; Notable for the following components:   Tetrahydrocannabinol POSITIVE (*)    All other components within normal limits  ETHANOL  SALICYLATE LEVEL    EKG None  Radiology No results found.  Procedures Procedures (including critical care time)  Medications Ordered in ED Medications  cloNIDine (CATAPRES) tablet 0.1 mg (0.1 mg Oral Given 03/08/19 2232)  ondansetron (ZOFRAN-ODT) disintegrating tablet 4 mg (4 mg Oral Given 03/08/19 2232)     Initial Impression / Assessment and Plan / ED Course  I have reviewed the triage vital signs and the nursing notes.  Pertinent labs & imaging results that were available during my care of the patient were reviewed by me and considered in my  medical decision making (see chart for details).     Janene MadeiraKevin Laumann is a 33 year old male here for medical clearance to go to rehab.  Patient was told to come here to get medical clearance from a rehab facility.  Patient denies any suicidal homicidal ideation.  Patient abuses heroin.  Has not used recently.  Lab work is unremarkable.  No signs of withdrawal on exam.  Will give clonidine and Zofran to help with  withdrawal symptoms.  Given outpatient resources for other rehab centers if he is unable to get into this one.  Discharged from ED in good condition.  No cute emergencies are found.  This chart was dictated using voice recognition software.  Despite best efforts to proofread,  errors can occur which can change the documentation meaning.     Final Clinical Impressions(s) / ED Diagnoses   Final diagnoses:  Heroin abuse [facility])    ED Discharge Orders         Ordered    cloNIDine (CATAPRES) 0.1 MG tablet  3 times daily     03/08/19 2319    ondansetron (ZOFRAN) 4 MG tablet  Every 6 hours     03/08/19 2319           Lennice Sites, DO 03/08/19 2328

## 2019-03-08 NOTE — ED Triage Notes (Signed)
Pt requesting detox from heroin last use 2 days ago. Pt has a rehab center to go to but states he has to be detoxed and medically cleared before he can go.

## 2019-03-10 ENCOUNTER — Encounter (HOSPITAL_COMMUNITY): Payer: Self-pay

## 2019-03-10 ENCOUNTER — Emergency Department (HOSPITAL_COMMUNITY)
Admission: EM | Admit: 2019-03-10 | Discharge: 2019-03-11 | Disposition: A | Discharge status: Home / Self Care | Payer: Self-pay | Attending: Emergency Medicine | Admitting: Emergency Medicine

## 2019-03-10 ENCOUNTER — Other Ambulatory Visit: Payer: Self-pay

## 2019-03-10 DIAGNOSIS — T401X1A Poisoning by heroin, accidental (unintentional), initial encounter: Secondary | ICD-10-CM | POA: Insufficient documentation

## 2019-03-10 DIAGNOSIS — Z720 Tobacco use: Secondary | ICD-10-CM | POA: Insufficient documentation

## 2019-03-10 NOTE — ED Triage Notes (Signed)
Pt using heroin tonight overdose on it. Pt was given 2 mg of narcan by PD. Woke up axox4 and came here so he can be discharged and  walk to rehab tomorrow. Pt is having really bad bleeding from Hemorid

## 2019-03-10 NOTE — ED Provider Notes (Signed)
[facility]   CSN: [id number] Arrival date & time: 03/10/19  2236    History   Chief Complaint Chief Complaint  Patient presents with  . Drug Overdose    HPI Janene MadeiraKevin Cordone is a 33 y.o. male with past medical history of substance abuse presents emergency department today with chief complaint of drug overdose.  Onset was acute happening just prior to arrival.  Patient states he injected heroin this evening and the next thing he remembers is the police being at his grandfather's house standing over him.  He was given 2 mg of Narcan and woke immediately.  PD reports he was alert and oriented x4 after Narcan.  Patient also reports he has external hemorrhoids.  While having a bowel movement earlier today there was bright red blood in the toilet.  He denies any associated rectal pain.  He states this is happened in the past and he does not want to be examined for it as he already knows what it is.  He denies any fever, chills, chest pain, shortness of breath, cough, abdominal pain, nausea, vomiting, urinary symptoms, diarrhea.  Also denies alcohol use, admits to smoking marijuana yesterday.  Denies suicidal or homicidal ideations.  Chart review shows patient was in the emergency department yesterday for medical clearance as he is attempting to get into rehab for heroin abuse.  Lab work from yesterday without significant findings and UDS positive for tetrahydrocannabinol    Past Medical History:  Diagnosis Date  . Back pain     Patient Active Problem List   Diagnosis Date Noted  . Opioid dependence with opioid-induced mood disorder (HCC)   . MDD (major depressive disorder), severe (HCC) 02/07/2019    History reviewed. No pertinent surgical history.      Home Medications    Prior to Admission medications   Medication Sig Start Date End Date Taking? Authorizing Provider  cloNIDine (CATAPRES) 0.1 MG tablet Take 1 tablet (0.1 mg  total) by mouth 3 (three) times daily for 5 days. 03/08/19 03/13/19  Curatolo, Adam, DO  hydrOXYzine (ATARAX/VISTARIL) 25 MG tablet Take 1 tablet (25 mg total) by mouth every 6 (six) hours as needed for anxiety. Patient not taking: Reported on 02/21/2019 02/11/19   Aldean BakerSykes, Janet E, NP  nicotine (NICODERM CQ - DOSED IN MG/24 HOURS) 21 mg/24hr patch Place 1 patch (21 mg total) onto the skin daily. Patient not taking: Reported on 02/21/2019 02/12/19   Aldean BakerSykes, Janet E, NP  ondansetron (ZOFRAN) 4 MG tablet Take 1 tablet (4 mg total) by mouth every 6 (six) hours for 12 doses. 03/08/19 03/11/19  Curatolo, Adam, DO  traZODone (DESYREL) 50 MG tablet Take 1 tablet (50 mg total) by mouth at bedtime as needed for sleep. Patient not taking: Reported on 02/21/2019 02/11/19   Aldean BakerSykes, Janet E, NP    Family History History reviewed. No pertinent family history.  Social History Social History   Tobacco Use  . Smoking status: Current Some Day Smoker  . Smokeless tobacco: Never Used  Substance Use Topics  . Alcohol use: No  . Drug use: Yes    Types: Cocaine, Marijuana, Heroin     Allergies   Vicodin [hydrocodone-acetaminophen], Codeine, and Tramadol   Review of Systems Review of Systems  Constitutional: Negative for chills and fever.  HENT: Negative for congestion, rhinorrhea, sinus pressure and sore throat.   Eyes: Negative for pain and redness.  Respiratory: Negative for cough, shortness of breath and wheezing.  Cardiovascular: Negative for chest pain and palpitations.  Gastrointestinal: Positive for blood in stool. Negative for abdominal pain, constipation, diarrhea, nausea and vomiting.  Genitourinary: Negative for dysuria.  Musculoskeletal: Negative for arthralgias, back pain, myalgias and neck pain.  Skin: Negative for rash and wound.  Neurological: Negative for dizziness, syncope, weakness, numbness and headaches.  Psychiatric/Behavioral: Negative for confusion.     Physical Exam Updated Vital  Signs BP 109/82   Temp 98.7 F (37.1 C) (Oral)   Resp 17   Ht 6\' 1"  (1.854 m)   Wt 77.1 kg   SpO2 100%   BMI 22.43 kg/m   Physical Exam Vitals signs and nursing note reviewed.  Constitutional:      General: He is not in acute distress.    Appearance: He is not ill-appearing.  HENT:     Head: Normocephalic and atraumatic.     Right Ear: Tympanic membrane and external ear normal.     Left Ear: Tympanic membrane and external ear normal.     Nose: Nose normal.     Mouth/Throat:     Mouth: Mucous membranes are moist.     Pharynx: Oropharynx is clear.  Eyes:     General: No scleral icterus.       Right eye: No discharge.        Left eye: No discharge.     Extraocular Movements: Extraocular movements intact.     Conjunctiva/sclera: Conjunctivae normal.     Pupils: Pupils are equal, round, and reactive to light.  Neck:     Musculoskeletal: Normal range of motion.     Vascular: No JVD.  Cardiovascular:     Rate and Rhythm: Normal rate and regular rhythm.     Pulses: Normal pulses.          Radial pulses are 2+ on the right side and 2+ on the left side.     Heart sounds: Normal heart sounds.  Pulmonary:     Comments: Lungs clear to auscultation in all fields. Symmetric chest rise. No wheezing, rales, or rhonchi. Abdominal:     Comments: Abdomen is soft, non-distended, and non-tender in all quadrants. No rigidity, no guarding. No peritoneal signs.  Genitourinary:    Comments: Pt defers rectal exam Musculoskeletal: Normal range of motion.  Skin:    General: Skin is warm and dry.     Capillary Refill: Capillary refill takes less than 2 seconds.     Comments: Track marks to right antecubital fossa, no signs of infection. Non tender to palpation, no erythema, no streaking, no discharge.  Full range of motion of right elbow and wrist.  Neurological:     Mental Status: He is oriented to person, place, and time.     GCS: GCS eye subscore is 4. GCS verbal subscore is 5. GCS motor  subscore is 6.     Comments: Fluent speech, no facial droop.  Psychiatric:        Behavior: Behavior normal.      ED Treatments / Results  Labs (all labs ordered are listed, but only abnormal results are displayed) Labs Reviewed - No data to display  EKG None  Radiology No results found.  Procedures Procedures (including critical care time)  Medications Ordered in ED Medications - No data to display   Initial Impression / Assessment and Plan / ED Course  I have reviewed the triage vital signs and the nursing notes.  Pertinent labs & imaging results that were available during my care of  the patient were reviewed by me and considered in my medical decision making (see chart for details).  Arrival patient is alert and oriented x4 in no acute distress, nontoxic in appearance.  He is afebrile, during exam heart rate ranged from 85-94. Patient was given Narcan by PD approximately 9 PM this evening.  He is neurologically intact, no focal deficit.  Patient declines rectal exam.  He states he has history of hemorrhoids and is not concerned about it right now. Given his recent lab work yesterday being overall unremarkable we will not recollect today.  If any changes in mental status will pursue labs and further work-up.  Plan to observe ensure he is stable and without signs of OD.   Patient care transferred to R. Browning PA-C at the end of my shift. Patient presentation, ED course, and plan of care discussed with review of all pertinent labs and imaging. Please see his note for further details regarding further ED course and disposition.   This note was prepared using Dragon voice recognition software and may include unintentional dictation errors due to the inherent limitations of voice recognition software.    Final Clinical Impressions(s) / ED Diagnoses   Final diagnoses:  None    ED Discharge Orders    None       Flint Melter 03/11/19 0032    Fatima Blank, MD 03/11/19 [phone]

## 2019-03-11 NOTE — ED Notes (Signed)
PT states understanding of care given, follow up care, and medication prescribed. PT  ambulated from ED.

## 2019-03-11 NOTE — ED Provider Notes (Signed)
3:34 AM Alert and oriented.  No repeat doses of narcan needed.  Stable for discharge.   Montine Circle, PA-C 03/11/19 0334    Fatima Blank, MD 03/11/19 [phone]

## 2019-03-21 ENCOUNTER — Other Ambulatory Visit: Payer: Self-pay

## 2019-03-21 ENCOUNTER — Emergency Department (HOSPITAL_COMMUNITY): Payer: Self-pay

## 2019-03-21 ENCOUNTER — Encounter (HOSPITAL_COMMUNITY): Payer: Self-pay | Admitting: Emergency Medicine

## 2019-03-21 ENCOUNTER — Emergency Department (HOSPITAL_COMMUNITY)
Admission: EM | Admit: 2019-03-21 | Discharge: 2019-03-21 | Disposition: A | Discharge status: Home / Self Care | Payer: Self-pay | Attending: Emergency Medicine | Admitting: Emergency Medicine

## 2019-03-21 DIAGNOSIS — Z23 Encounter for immunization: Secondary | ICD-10-CM | POA: Insufficient documentation

## 2019-03-21 DIAGNOSIS — F17200 Nicotine dependence, unspecified, uncomplicated: Secondary | ICD-10-CM | POA: Insufficient documentation

## 2019-03-21 DIAGNOSIS — F1210 Cannabis abuse, uncomplicated: Secondary | ICD-10-CM | POA: Insufficient documentation

## 2019-03-21 DIAGNOSIS — L02414 Cutaneous abscess of left upper limb: Secondary | ICD-10-CM | POA: Insufficient documentation

## 2019-03-21 DIAGNOSIS — F1110 Opioid abuse, uncomplicated: Secondary | ICD-10-CM | POA: Insufficient documentation

## 2019-03-21 DIAGNOSIS — L0291 Cutaneous abscess, unspecified: Secondary | ICD-10-CM

## 2019-03-21 DIAGNOSIS — F1410 Cocaine abuse, uncomplicated: Secondary | ICD-10-CM | POA: Insufficient documentation

## 2019-03-21 MED ORDER — LIDOCAINE HCL (PF) 1 % IJ SOLN
5.0000 mL | Freq: Once | INTRAMUSCULAR | Status: AC
  Administered 2019-03-21: 5 mL
  Filled 2019-03-21: qty 5

## 2019-03-21 MED ORDER — CLINDAMYCIN HCL 300 MG PO CAPS: 300.0000 mg | ORAL_CAPSULE | Freq: Three times a day (TID) | ORAL | 0 refills | Status: AC

## 2019-03-21 MED ORDER — TETANUS-DIPHTH-ACELL PERTUSSIS 5-2.5-18.5 LF-MCG/0.5 IM SUSP
0.5000 mL | Freq: Once | INTRAMUSCULAR | Status: AC
  Administered 2019-03-21: 0.5 mL via INTRAMUSCULAR
  Filled 2019-03-21: qty 0.5

## 2019-03-21 MED ORDER — CLINDAMYCIN HCL 150 MG PO CAPS
300.0000 mg | ORAL_CAPSULE | Freq: Once | ORAL | Status: AC
  Administered 2019-03-21: 300 mg via ORAL
  Filled 2019-03-21: qty 2

## 2019-03-21 NOTE — ED Notes (Addendum)
Pt reports he was injecting IV drugs a couple weeks ago and thinks the needle may have been dirty.

## 2019-03-21 NOTE — ED Provider Notes (Signed)
[facility]   CSN: [id number] Arrival date & time: 03/21/19  1750   History   Chief Complaint Chief Complaint  Patient presents with  . Abscess    HPI Janene MadeiraKevin Galli is a 33 y.o. male with past medical history significant for IV drug use who presents for evaluation of abscess.  Patient states he has developed an abscess to his left AC.  Patient states there is possibly a needle stuck in there however he does not think so.  Patient states he last injected at the site 2 weeks ago.  Denies fever, chills, nausea, vomiting, abdominal pain, diarrhea, dysuria, decreased range of motion his extremities, redness, swelling, warmth, streaking up his extremities.  States site has been present x10 days.  Gradually worsening.  He rates his current pain a 4/10.  [facility] located [facility] the left [facility].  Has not taken anything for his pain.  Denies additional aggravating or alleviating factors.  History obtained from patient and past medical records.  No interpreter was used.       HPI  Past Medical History:  Diagnosis Date  . Back pain     Patient Active Problem List   Diagnosis Date Noted  . Opioid dependence with opioid-induced mood disorder (HCC)   . MDD (major depressive disorder), severe (HCC) 02/07/2019    History reviewed. No pertinent surgical history.      Home Medications    Prior to Admission medications   Medication Sig Start Date End Date Taking? Authorizing Provider  clindamycin (CLEOCIN) 300 MG capsule Take 1 capsule (300 mg total) by mouth 3 (three) times daily for 7 days. 03/21/19 03/28/19  Mckayla Mulcahey A, PA-C  cloNIDine (CATAPRES) 0.1 MG tablet Take 1 tablet (0.1 mg total) by mouth 3 (three) times daily for 5 days. Patient not taking: Reported on 03/11/2019 03/08/19 03/13/19  Virgina Norfolkuratolo, Adam, DO  hydrOXYzine (ATARAX/VISTARIL) 25 MG tablet Take 1 tablet (25 mg total) by mouth every 6 (six) hours as needed for anxiety. Patient  not taking: Reported on 02/21/2019 02/11/19   Aldean BakerSykes, Janet E, NP  nicotine (NICODERM CQ - DOSED IN MG/24 HOURS) 21 mg/24hr patch Place 1 patch (21 mg total) onto the skin daily. Patient not taking: Reported on 02/21/2019 02/12/19   Aldean BakerSykes, Janet E, NP  traZODone (DESYREL) 50 MG tablet Take 1 tablet (50 mg total) by mouth at bedtime as needed for sleep. Patient not taking: Reported on 02/21/2019 02/11/19   Aldean BakerSykes, Janet E, NP    Family History History reviewed. No pertinent family history.  Social History Social History   Tobacco Use  . Smoking status: Current Some Day Smoker  . Smokeless tobacco: Never Used  Substance Use Topics  . Alcohol use: No  . Drug use: Yes    Types: Cocaine, Marijuana, Heroin     Allergies   Vicodin [hydrocodone-acetaminophen], Codeine, and Tramadol   Review of Systems Review of Systems  Constitutional: Negative.   HENT: Negative.   Respiratory: Negative.   Cardiovascular: Negative.   Gastrointestinal: Negative.   Genitourinary: Negative.   Musculoskeletal: Negative.   Skin: Positive for wound.  Neurological: Negative.   All other systems reviewed and are negative.   Physical Exam Updated Vital Signs BP 122/72   Pulse 88   Temp 97.7 F (36.5 C) (Oral)   Resp 16   SpO2 99%   Physical Exam Vitals signs and nursing note reviewed.  Constitutional:      General: He is not in  acute distress.    Appearance: He is well-developed. He is not ill-appearing, toxic-appearing or diaphoretic.  HENT:     Head: Normocephalic and atraumatic.     Nose: Nose normal.     Mouth/Throat:     Mouth: Mucous membranes are moist.     Pharynx: Oropharynx is clear.  Eyes:     Pupils: Pupils are equal, round, and reactive to light.  Neck:     Musculoskeletal: Normal range of motion and neck supple.  Cardiovascular:     Rate and Rhythm: Normal rate and regular rhythm.     Pulses: Normal pulses.     Heart sounds: Normal heart sounds.  Pulmonary:     Effort:  Pulmonary effort is normal. No respiratory distress.     Breath sounds: Normal breath sounds.  Abdominal:     General: Bowel sounds are normal. There is no distension.     Palpations: Abdomen is soft.  Musculoskeletal: Normal range of motion.     Comments: 2 cm rounded area of induration to left AC.  Mild surrounding erythema.  Small fluctuant area to center.  No streaking.  No swelling, redness or warmth to forearm.  Full range of motion to left elbow without difficulty.  Mild tender to palpation to left AC.  Skin:    General: Skin is warm and dry.     Comments: 2cm area of rounded erythema to left AC.  See picture in chart.  Brisk capillary refill.  Neurological:     Mental Status: He is alert.     Comments: 5/5 strength to left upper extremity without difficulty.  Ambulatory that difficulty        ED Treatments / Results  Labs (all labs ordered are listed, but only abnormal results are displayed) Labs Reviewed - No data to display  EKG None  Radiology Dg Elbow Complete Left  Result Date: 03/21/2019 CLINICAL DATA:  Elbow soft tissue abscess, possible foreign body, initial encounter EXAM: LEFT ELBOW - COMPLETE 3+ VIEW COMPARISON:  None. FINDINGS: No acute fracture or dislocation is noted. Soft tissue prominence is noted consistent with subcutaneous abscess. No definitive radiopaque foreign body is noted. IMPRESSION: Soft tissue abnormality without radiopaque foreign body or bony abnormality. Electronically Signed   By: Inez Catalina M.D.   On: 03/21/2019 20:28   [place] Lt Upper Extrem Ltd Soft Tissue Non Vascular  Result Date: 03/21/2019 CLINICAL DATA:  33 year old male with a palpable area in the medial aspect of the left antecubital region. History of IV drug use. EXAM: ULTRASOUND left UPPER EXTREMITY LIMITED TECHNIQUE: Ultrasound examination of the upper extremity soft tissues was performed in the area of clinical concern. COMPARISON:  Left upper extremity radiograph dated 03/21/2019  FINDINGS: There is a 2.0 x 3.4 cm complex collection in the soft tissues of the medial antecubital fossa corresponding to the palpable area. There is edema and hyperemia of the surrounding tissue. This is most consistent with phlegmon or developing abscess. IMPRESSION: Phlegmon or developing abscess in the medial antecubital soft tissues. Electronically Signed   By: Anner Crete M.D.   On: 03/21/2019 21:25    Procedures .Marland KitchenIncision and Drainage  Date/Time: 03/21/2019 10:09 PM Performed by: Nettie Elm, PA-C Authorized by: Nettie Elm, PA-C   Consent:    Consent obtained:  Verbal   Consent given by:  Patient   Risks discussed:  Bleeding, incomplete drainage, pain and damage to other organs   Alternatives discussed:  No treatment Universal protocol:  Procedure explained and questions answered to patient or proxy's satisfaction: yes     Relevant documents present and verified: yes     Test results available and properly labeled: yes     Imaging studies available: yes     Required blood products, implants, devices, and special equipment available: yes     Site/side marked: yes     Immediately prior to procedure a time out was called: yes     Patient identity confirmed:  Verbally with patient Location:    Type:  Abscess   Location:  Upper extremity   Upper extremity location:  Elbow   Elbow location:  L elbow Pre-procedure details:    Skin preparation:  Betadine Anesthesia (see MAR for exact dosages):    Anesthesia method:  Local infiltration   Local anesthetic:  Lidocaine 1% w/o epi Procedure type:    Complexity:  Simple Procedure details:    Needle aspiration: yes     Needle size:  20 G   Incision types:  Single straight   Wound management:  Extensive cleaning   Drainage:  Serosanguinous   Drainage amount:  Moderate Post-procedure details:    Patient tolerance of procedure:  Procedure terminated at patient's request   (including critical care time)   Medications Ordered in ED Medications  lidocaine (PF) (XYLOCAINE) 1 % injection 5 mL (5 mLs Infiltration Given 03/21/19 2140)  Tdap (BOOSTRIX) injection 0.5 mL (0.5 mLs Intramuscular Given 03/21/19 2140)  clindamycin (CLEOCIN) capsule 300 mg (300 mg Oral Given 03/21/19 2210)   Initial Impression / Assessment and Plan / ED Course  I have reviewed the triage vital signs and the nursing notes.  Pertinent labs & imaging results that were available during my care of the patient were reviewed by me and considered in my medical decision making (see chart for details).  [age] old male appears otherwise well presents for evaluation of abscess to left AC. Has been present x10 days.  He is afebrile, nonseptic, non-ill-appearing.  Patient history of IV heroin use.  States last injected approximately 2 weeks ago to this area.  2 cm area of rounded area of induration with minimal fluctuance to left AC. Multiple trak marks. He has a normal musculoskeletal exam with full range of motion.  Neurovascularly intact.  Have low suspicion for septic joint, sepsis.  No streaking, redness to arm.  No evidence of involvement to forearm.  He has no systemic symptoms to suggest sepsis.  Plain film negative for retained needle or bony abnormality. Will obtain formal ultrasound to assess for abscess. Patient refuses labs. Discussed risk versus benefit to include sepsis, infection, electrolyte abnormality.  Patient voiced understand risk versus benefit and continues to decline labs. States "Im not being coming into the hospital for this shit." referring to a hospital admission.  US with phlegmon vs developing abscess. Attempted needle aspiration. 1cc of serosanguinous fluid removed. Patient would not tolerate complete drainage. Will dc home with abx. First PO dose given in ED 2/2/ no IV access. SW consulted due to patient stating that he cannot afford abx outpatient. Discussed follow up with hand surgery and reevaluation in ED in 2 days  for wound recheck. Discussed strict return precautions. Voiced understanding and is agreeable for follow up.     Final Clinical Impressions(s) / ED Diagnoses   Final diagnoses:  Abscess    ED Discharge Orders         Ordered    clindamycin (CLEOCIN) 300 MG capsule  3 times daily  03/21/19 2220           Salihah Peckham A, PA-C 03/21/19 2250    Tegeler, Canary Brimhristopher J, MD 03/21/19 [phone]

## 2019-03-21 NOTE — ED Triage Notes (Signed)
Pt as a golf-ball sized abscess to L AC. Pt reports swelling over the last 2-3 days. Pt denies drainage.

## 2019-03-21 NOTE — Progress Notes (Addendum)
CSW received consult for medication assistance. CSW spoke with PA and noted patient had no insurance and would need antibiotics. [facility] [address] [facility] CSW informed RN CM as patient may require MATCH as social work does not have access. CSW signed off at this time. Please consult social work for future social work needs.  Lamonte Richer, LCSW, [place] Worker II [phone]

## 2019-03-21 NOTE — Care Management (Signed)
ED CM was contacted by ED CSW concerning patient needing medication assistance. Patient is uninsured and does not have a PCP, he needs a course of clindamycin for an abscess. CM met with patient to explain MATCH, patient is homeless as per patient, and cannot afford the $3 copay, CM enrolled patient and reviewed the guidelines, [facility] and [facility] [address] [facility] . Patient made aware where to pick up the prescription verbalized understanding he states he wont have a ride until 3am   CM Explained again pharmacy is opened  24 hours. Patient verbalized understand teach back done. Updated EDP and RN on [facility].  No further ED CM needs identifed.

## 2019-03-21 NOTE — Discharge Instructions (Signed)
Take the antibiotics as prescribed.  You need to follow-up in 2 days for a wound check.  If you develop streaking up your arm/ fever you need to seek reevaluation sooner.

## 2019-03-21 NOTE — ED Notes (Signed)
Discharge instructions and prescription discussed with Pt. Pt verbalized understanding. Pt stable and ambulatory.    

## 2019-03-23 ENCOUNTER — Other Ambulatory Visit: Payer: Self-pay

## 2019-03-23 ENCOUNTER — Emergency Department (HOSPITAL_COMMUNITY)
Admission: EM | Admit: 2019-03-23 | Discharge: 2019-03-24 | Disposition: A | Discharge status: Home / Self Care | Payer: Self-pay | Attending: Emergency Medicine | Admitting: Emergency Medicine

## 2019-03-23 ENCOUNTER — Encounter (HOSPITAL_COMMUNITY): Payer: Self-pay | Admitting: Emergency Medicine

## 2019-03-23 DIAGNOSIS — L02414 Cutaneous abscess of left upper limb: Secondary | ICD-10-CM | POA: Insufficient documentation

## 2019-03-23 DIAGNOSIS — F1120 Opioid dependence, uncomplicated: Secondary | ICD-10-CM | POA: Insufficient documentation

## 2019-03-23 DIAGNOSIS — F17200 Nicotine dependence, unspecified, uncomplicated: Secondary | ICD-10-CM | POA: Insufficient documentation

## 2019-03-23 HISTORY — DX: Other psychoactive substance abuse, uncomplicated: F19.10

## 2019-03-23 LAB — CBC WITH DIFFERENTIAL/PLATELET
Abs Immature Granulocytes: 0.01 10*3/uL (ref 0.00–0.07)
Basophils Absolute: 0.1 10*3/uL (ref 0.0–0.1)
Basophils Relative: 1 %
Eosinophils Absolute: 0.3 10*3/uL (ref 0.0–0.5)
Eosinophils Relative: 3 %
HCT: 37.4 % — ABNORMAL LOW (ref 39.0–52.0)
Hemoglobin: 12.1 g/dL — ABNORMAL LOW (ref 13.0–17.0)
Immature Granulocytes: 0 %
Lymphocytes Relative: 41 %
Lymphs Abs: 4 10*3/uL (ref 0.7–4.0)
MCH: 26.9 pg (ref 26.0–34.0)
MCHC: 32.4 g/dL (ref 30.0–36.0)
MCV: 83.3 fL (ref 80.0–100.0)
Monocytes Absolute: 0.7 10*3/uL (ref 0.1–1.0)
Monocytes Relative: 7 %
Neutro Abs: 4.8 10*3/uL (ref 1.7–7.7)
Neutrophils Relative %: 48 %
Platelets: 271 10*3/uL (ref 150–400)
RBC: 4.49 MIL/uL (ref 4.22–5.81)
RDW: 13.5 % (ref 11.5–15.5)
WBC: 9.8 10*3/uL (ref 4.0–10.5)
nRBC: 0 % (ref 0.0–0.2)

## 2019-03-23 LAB — COMPREHENSIVE METABOLIC PANEL
ALT: 50 U/L — ABNORMAL HIGH (ref 0–44)
AST: 39 U/L (ref 15–41)
Albumin: 3.2 g/dL — ABNORMAL LOW (ref 3.5–5.0)
Alkaline Phosphatase: 76 U/L (ref 38–126)
Anion gap: 8 (ref 5–15)
BUN: 8 mg/dL (ref 6–20)
CO2: 24 mmol/L (ref 22–32)
Calcium: 8.6 mg/dL — ABNORMAL LOW (ref 8.9–10.3)
Chloride: 109 mmol/L (ref 98–111)
Creatinine, Ser: 0.99 mg/dL (ref 0.61–1.24)
GFR calc Af Amer: >60 mL/min (ref >60)
GFR calc non Af Amer: >60 mL/min (ref >60)
Glucose, Bld: 103 mg/dL — ABNORMAL HIGH (ref 70–99)
Potassium: 3.5 mmol/L (ref 3.5–5.1)
Sodium: 141 mmol/L (ref 135–145)
Total Bilirubin: 0.3 mg/dL (ref 0.3–1.2)
Total Protein: 6.7 g/dL (ref 6.5–8.1)

## 2019-03-23 LAB — LACTIC ACID, PLASMA: Lactic Acid, Venous: 1.4 mmol/L (ref 0.5–1.9)

## 2019-03-23 NOTE — ED Triage Notes (Addendum)
Pt here for reeval of golf ball sized abscess to [place]. States wound is larger than visit on 23rd, did not get antibiotics filled because he "did not have a coupon." last IV drug use this morning. Pt reports they lanced it with no drainage and was told if it got worse he would need surgery.

## 2019-03-24 ENCOUNTER — Telehealth: Payer: Self-pay | Admitting: Surgery

## 2019-03-24 MED ORDER — SULFAMETHOXAZOLE-TRIMETHOPRIM 800-160 MG PO TABS: 1.0000 | ORAL_TABLET | Freq: Two times a day (BID) | ORAL | 0 refills | Status: AC

## 2019-03-24 MED ORDER — LIDOCAINE-EPINEPHRINE (PF) 2 %-1:200000 IJ SOLN
10.0000 mL | Freq: Once | INTRAMUSCULAR | Status: AC
  Administered 2019-03-24: 10 mL
  Filled 2019-03-24: qty 20

## 2019-03-24 NOTE — Telephone Encounter (Signed)
ED CM  Received message patient had an issue with [place] letter and was unable to get his antibiotic. [facility] [address] [facility] resolved, patient can pick up prescription at anytime.

## 2019-03-24 NOTE — ED Notes (Signed)
Wound care performed. Gave pt sandwich and drink

## 2019-03-24 NOTE — ED Provider Notes (Addendum)
[facility]   CSN: [id number] Arrival date & time: 03/23/19  [id number]    History   Chief Complaint Chief Complaint  Patient presents with  . Abscess    HPI Jon Castro is a 33 y.o. male history of opioid dependence, IV heroin use who presents to the emergency department with a chief complaint of wound recheck.  Patient was seen in the ER on the 23rd for an abscess to his left antecubital area.  Incision and drainage was performed and 1 cc of serosanguineous fluid was removed.  The patient ultimately was unable to tolerate complete drainage.  Social work was consulted to assist with helping the patient obtain his antibiotics prescription.  However, the patient states that the prescription was never called in so he has not been taking antibiotics for the last 2 days.  He denies fever, chills.  He is unable to state if the area is worsening.  He denies worsening pain to the left arm.  No numbness or weakness.  No treatment prior to arrival.     The history is provided by the patient. No language interpreter was used.    Past Medical History:  Diagnosis Date  . Back pain   . IV drug abuse [facility](HCC)     Patient Active Problem List   Diagnosis Date Noted  . Opioid dependence with opioid-induced mood disorder (HCC)   . MDD (major depressive disorder), severe (HCC) 02/07/2019    History reviewed. No pertinent surgical history.      Home Medications    Prior to Admission medications   Medication Sig Start Date End Date Taking? Authorizing Provider  clindamycin (CLEOCIN) 300 MG capsule Take 1 capsule (300 mg total) by mouth 3 (three) times daily for 7 days. 03/21/19 03/28/19  Henderly, Britni A, PA-C  sulfamethoxazole-trimethoprim (BACTRIM DS) 800-160 MG tablet Take 1 tablet by mouth 2 (two) times daily for 7 days. 03/24/19 03/31/19  Jizelle Conkey A, PA-C  cloNIDine (CATAPRES) 0.1 MG tablet Take 1 tablet (0.1 mg total) by mouth 3  (three) times daily for 5 days. Patient not taking: Reported on 03/24/2019 03/08/19 03/24/19  Virgina Norfolkuratolo, Adam, DO  traZODone (DESYREL) 50 MG tablet Take 1 tablet (50 mg total) by mouth at bedtime as needed for sleep. Patient not taking: Reported on 02/21/2019 02/11/19 03/24/19  Aldean BakerSykes, Janet E, NP    Family History No family history on file.  Social History Social History   Tobacco Use  . Smoking status: Current Some Day Smoker  . Smokeless tobacco: Never Used  Substance Use Topics  . Alcohol use: No  . Drug use: Yes    Types: Cocaine, Marijuana, Heroin     Allergies   Vicodin [hydrocodone-acetaminophen], Codeine, and Tramadol   Review of Systems Review of Systems  Constitutional: Negative for activity change, chills and fever.  Respiratory: Negative for shortness of breath.   Cardiovascular: Negative for chest pain.  Gastrointestinal: Negative for abdominal pain.  Musculoskeletal: Negative for back pain.  Skin: Positive for wound. Negative for rash.  Neurological: Negative for weakness and numbness.     Physical Exam Updated Vital Signs BP (!) 110/59   Pulse 69   Temp 97.6 F (36.4 C) (Oral)   Resp 18   SpO2 99%   Physical Exam Vitals signs and nursing note reviewed.  Constitutional:      Appearance: He is well-developed.  HENT:     Head: Normocephalic.  Eyes:     Conjunctiva/sclera: Conjunctivae  normal.  Neck:     Musculoskeletal: Neck supple.  Cardiovascular:     Rate and Rhythm: Normal rate and regular rhythm.     Heart sounds: No murmur.  Pulmonary:     Effort: Pulmonary effort is normal.  Abdominal:     General: There is no distension.     Palpations: Abdomen is soft.  Skin:    General: Skin is warm and dry.     Comments: There is a golf ball sized area of fluctuance and nodularity is noted to the left AC.  No overlying warmth or redness.  No red streaking.  Radial pulses are 2+ and symmetric.  Sensation is intact and equal throughout.  Track marks  noted to the bilateral AC.  Neurological:     Mental Status: He is alert.  Psychiatric:        Behavior: Behavior normal.          ED Treatments / Results  Labs (all labs ordered are listed, but only abnormal results are displayed) Labs Reviewed  COMPREHENSIVE METABOLIC PANEL - Abnormal; Notable for the following components:      Result Value   Glucose, Bld 103 (*)    Calcium 8.6 (*)    Albumin 3.2 (*)    ALT 50 (*)    All other components within normal limits  CBC WITH DIFFERENTIAL/PLATELET - Abnormal; Notable for the following components:   Hemoglobin 12.1 (*)    HCT 37.4 (*)    All other components within normal limits  CULTURE, BLOOD (ROUTINE X 2)  CULTURE, BLOOD (ROUTINE X 2)  LACTIC ACID, PLASMA  LACTIC ACID, PLASMA    EKG None  Radiology No results found.  Procedures .Marland KitchenIncision and Drainage  Date/Time: 03/24/2019 3:03 AM Performed by: Joanne Gavel, PA-C Authorized by: Joanne Gavel, PA-C   Consent:    Consent obtained:  Verbal   Consent given by:  Patient   Risks discussed:  Bleeding, incomplete drainage, pain, infection and damage to other organs   Alternatives discussed:  No treatment Location:    Type:  Fluid collection   Size:  Golf-ball sized    Location: Left antecubital region. Pre-procedure details:    Skin preparation:  Hibiclens Anesthesia (see MAR for exact dosages):    Anesthesia method:  Local infiltration   Local anesthetic:  Lidocaine 2% WITH epi Procedure type:    Complexity:  Simple Procedure details:    Incision types:  Single straight   Incision depth:  Dermal   Scalpel blade:  10   Wound management:  Extensive cleaning, irrigated with saline and probed and deloculated   Drainage:  Serosanguinous and bloody   Drainage amount:  Moderate   Wound treatment:  Wound left open   Packing materials:  None Post-procedure details:    Patient tolerance of procedure:  Tolerated well, no immediate complications   (including  critical care time)  Medications Ordered in ED Medications  lidocaine-EPINEPHrine (XYLOCAINE W/EPI) 2 %-1:200000 (PF) injection 10 mL (10 mLs Infiltration Given by Other 03/24/19 0214)     Initial Impression / Assessment and Plan / ED Course  I have reviewed the triage vital signs and the nursing notes.  Pertinent labs & imaging results that were available during my care of the patient were reviewed by me and considered in my medical decision making (see chart for details).        33 year old male with history of opioid dependence and IV heroin use presenting for wound recheck of  a fluctuant mass to the left AC.  No constitutional symptoms.  Incision and drainage was attempted in the ER on July 23, but only 1 cc of serosanguineous fluid was able to be aspirated.  Ultimately the procedure was stopped due to inability of patient to tolerate procedure.  Social work was consulted to assist with obtaining antibiotics for the patient as he reports he is unable to afford even a $4 prescription.  However, the patient reports that he was unable to pick up the prescription as they did not receive the form to help with financial assistance.  [facility] [address] [facility]   No constitutional symptoms..  Patient is afebrile and otherwise hemodynamically stable here.  There is no overlying signs of infection to the area.  Incision and drainage performed by me with moderate amount of serosanguineous and bloody drainage.  No purulent drainage.  Wound was dressed and bacitracin applied.  Patient was discussed with Dr. Elesa MassedWard, attending physician. Initially plan to have the patient wait overnight for case management consult in the morning in the lobby to assist with MATCH form.  However, case management is not available on weekends. The patient was given a prescription of Bactrim and [facility] coupon, that is less than $10 with coupon. Patient became upset  and was like "I'm homeless and I can't afford anything."   RN spoke with charge nurse and patient decided to wait in the lobby overnight despite alternative prescription. Prior to discharge, the patient was in no acute distress.  No signs of opioid withdrawal as he last used earlier today.  No concern for sepsis at this time or cellulitis.   Home wound care instructions given.  Return precautions the ER given.  Patient is safe for discharge to home at this time.     Final Clinical Impressions(s) / ED Diagnoses   Final diagnoses:  Abscess of left arm    ED Discharge Orders         Ordered    sulfamethoxazole-trimethoprim (BACTRIM DS) 800-160 MG tablet  2 times daily     03/24/19 0242           Sharmeka Palmisano A, PA-C 03/24/19 0309    Ward, Layla MawKristen N, DO 03/24/19 0317    Namari Breton A, PA-C 03/24/19 0459    Ward, Layla MawKristen N, DO 03/24/19 [phone]

## 2019-03-24 NOTE — Discharge Instructions (Addendum)
Thank you for allowing me to care for you today in the Emergency Department.   Case management will see you in the morning. I have placed the consult.   Change the dressing on your wound once daily.  Clean the area with warm water and soap.  After you have clean the area, apply bacitracin or Neosporin to the wound.  Then put a new gauze dressing over the area.  The hardened area around where I drained should soften and drain over the next few days.  [facility] [address]. Follow up as directed by case management.

## 2019-03-24 NOTE — ED Notes (Signed)
Patient verbalizes understanding of discharge instructions. Opportunity for questioning and answers were provided. Armband removed by staff, pt discharged from ED ambulatory.   

## 2019-03-28 LAB — CULTURE, BLOOD (ROUTINE X 2)
Culture: NO GROWTH
Special Requests: ADEQUATE

## 2019-04-21 ENCOUNTER — Emergency Department (HOSPITAL_COMMUNITY)
Admission: EM | Admit: 2019-04-21 | Discharge: 2019-04-23 | Disposition: A | Discharge status: Other Acute Inpatient Hospital | Payer: Self-pay | Attending: Emergency Medicine | Admitting: Emergency Medicine

## 2019-04-21 ENCOUNTER — Other Ambulatory Visit: Payer: Self-pay

## 2019-04-21 DIAGNOSIS — R45851 Suicidal ideations: Secondary | ICD-10-CM | POA: Insufficient documentation

## 2019-04-21 DIAGNOSIS — F17200 Nicotine dependence, unspecified, uncomplicated: Secondary | ICD-10-CM | POA: Insufficient documentation

## 2019-04-21 DIAGNOSIS — F332 Major depressive disorder, recurrent severe without psychotic features: Secondary | ICD-10-CM | POA: Insufficient documentation

## 2019-04-21 DIAGNOSIS — F1120 Opioid dependence, uncomplicated: Secondary | ICD-10-CM | POA: Insufficient documentation

## 2019-04-22 ENCOUNTER — Encounter (HOSPITAL_COMMUNITY): Payer: Self-pay | Admitting: *Deleted

## 2019-04-22 ENCOUNTER — Other Ambulatory Visit: Payer: Self-pay

## 2019-04-22 LAB — CBC
HCT: 41.5 % (ref 39.0–52.0)
Hemoglobin: 12.9 g/dL — ABNORMAL LOW (ref 13.0–17.0)
MCH: 26.9 pg (ref 26.0–34.0)
MCHC: 31.1 g/dL (ref 30.0–36.0)
MCV: 86.5 fL (ref 80.0–100.0)
Platelets: 293 10*3/uL (ref 150–400)
RBC: 4.8 MIL/uL (ref 4.22–5.81)
RDW: 14.8 % (ref 11.5–15.5)
WBC: 8.7 10*3/uL (ref 4.0–10.5)
nRBC: 0 % (ref 0.0–0.2)

## 2019-04-22 LAB — RAPID URINE DRUG SCREEN, HOSP PERFORMED
Amphetamines: NOT DETECTED
Barbiturates: NOT DETECTED
Benzodiazepines: NOT DETECTED
Cocaine: POSITIVE — AB
Opiates: POSITIVE — AB
Tetrahydrocannabinol: POSITIVE — AB

## 2019-04-22 LAB — COMPREHENSIVE METABOLIC PANEL
ALT: 348 U/L — ABNORMAL HIGH (ref 0–44)
AST: 213 U/L — ABNORMAL HIGH (ref 15–41)
Albumin: 3.2 g/dL — ABNORMAL LOW (ref 3.5–5.0)
Alkaline Phosphatase: 217 U/L — ABNORMAL HIGH (ref 38–126)
Anion gap: 9 (ref 5–15)
BUN: 9 mg/dL (ref 6–20)
CO2: 23 mmol/L (ref 22–32)
Calcium: 8.6 mg/dL — ABNORMAL LOW (ref 8.9–10.3)
Chloride: 104 mmol/L (ref 98–111)
Creatinine, Ser: 0.86 mg/dL (ref 0.61–1.24)
GFR calc Af Amer: >60 mL/min (ref >60)
GFR calc non Af Amer: >60 mL/min (ref >60)
Glucose, Bld: 122 mg/dL — ABNORMAL HIGH (ref 70–99)
Potassium: 4.1 mmol/L (ref 3.5–5.1)
Sodium: 136 mmol/L (ref 135–145)
Total Bilirubin: 0.5 mg/dL (ref 0.3–1.2)
Total Protein: 6.6 g/dL (ref 6.5–8.1)

## 2019-04-22 LAB — ACETAMINOPHEN LEVEL: Acetaminophen (Tylenol), Serum: <10 ug/mL — ABNORMAL LOW (ref 10–30)

## 2019-04-22 LAB — ETHANOL: Alcohol, Ethyl (B): <10 mg/dL (ref <10)

## 2019-04-22 LAB — SALICYLATE LEVEL: Salicylate Lvl: <7 mg/dL (ref 2.8–30.0)

## 2019-04-22 MED ORDER — CLONIDINE HCL 0.1 MG PO TABS
0.1000 mg | ORAL_TABLET | Freq: Four times a day (QID) | ORAL | Status: DC
  Administered 2019-04-22 (×3): 0.1 mg via ORAL
  Filled 2019-04-22 (×3): qty 1

## 2019-04-22 MED ORDER — CLONIDINE HCL 0.1 MG PO TABS: 0.1000 mg | ORAL_TABLET | Freq: Every day | ORAL | Status: DC

## 2019-04-22 MED ORDER — CLONIDINE HCL 0.1 MG PO TABS: 0.1000 mg | ORAL_TABLET | ORAL | Status: DC

## 2019-04-22 MED ORDER — METHOCARBAMOL 500 MG PO TABS
500.0000 mg | ORAL_TABLET | Freq: Three times a day (TID) | ORAL | Status: DC | PRN
  Administered 2019-04-22: 500 mg via ORAL
  Filled 2019-04-22: qty 1

## 2019-04-22 MED ORDER — HYDROXYZINE HCL 25 MG PO TABS
25.0000 mg | ORAL_TABLET | Freq: Four times a day (QID) | ORAL | Status: DC | PRN
  Administered 2019-04-22: 25 mg via ORAL
  Filled 2019-04-22: qty 1

## 2019-04-22 MED ORDER — DICYCLOMINE HCL 20 MG PO TABS: 20.0000 mg | ORAL_TABLET | Freq: Four times a day (QID) | ORAL | Status: DC | PRN

## 2019-04-22 MED ORDER — NAPROXEN 250 MG PO TABS
500.0000 mg | ORAL_TABLET | Freq: Two times a day (BID) | ORAL | Status: DC | PRN
  Administered 2019-04-22: 500 mg via ORAL
  Filled 2019-04-22: qty 2

## 2019-04-22 MED ORDER — ONDANSETRON 4 MG PO TBDP
4.0000 mg | ORAL_TABLET | Freq: Four times a day (QID) | ORAL | Status: DC | PRN
  Administered 2019-04-22: 4 mg via ORAL
  Filled 2019-04-22: qty 1

## 2019-04-22 MED ORDER — LOPERAMIDE HCL 2 MG PO CAPS: 2.0000 mg | ORAL_CAPSULE | ORAL | Status: DC | PRN

## 2019-04-22 NOTE — BH Assessment (Addendum)
Tele Assessment Note   Patient Name: Jon Castro MRN: [id number] Referring Physician: Ripley Fraise, MD Location of Patient: [facility], WPT Location of Provider: [place]  Revel Stellmach is an 33 y.o. single male who presents unaccompanied to [facility] reporting heroin use, depressive symptoms and suicidal ideation. Pt has a history of depression and substance abuse and says he has felt severely depressed and suicidal for the past 4-5 days. He reports he is currently suicidal with a plan to walk into traffic. Pt acknowledges symptoms including crying spells, social withdrawal, loss of interest in usual pleasures, fatigue, irritability, decreased concentration, decreased sleep, decreased appetite and feelings of guilt, worthlessness and hopelessness. He denies history of previous suicide attempts. Pt denies any history of intentional self-injurious behaviors. Pt denies current homicidal ideation or history of violence. Pt denies any history of auditory or visual hallucinations. Pt reports using approximately 0.5 grams of heroin daily. He has a history of using other substances but says he has only been using heroin recently.  Pt identifies his mental health symptoms and consequences of his substance use as his primary stressor. He says he doesn't have a permanent residence but is currently staying with a friend. He says he is unemployed and has no Pensions consultant. He identifies his father, who lives in [place], as his only support. He states he has a child who is cared for by the child's mother. He denies history of abuse or trauma. He denies current legal problems. He denies access to firearms.  Pt denies any current outpatient providers. He denies currently taking any psychiatric medications. He was psychiatrically hospitalized at [facility] 06/11-06/15/20 and was at [facility] in 2014.  Pt cannot identify anyone to contact for collateral information.  Pt is  dressed in hospital scrubs, alert and oriented x4. Pt speaks in a clear tone, at moderate volume and normal pace. Motor behavior appears normal. Eye contact is good. Pt's mood is depressed and affect is congruent with mood. Thought process is coherent and relevant. There is no indication Pt is currently responding to internal stimuli or experiencing delusional thought content. Pt was calm and cooperative throughout assessment. He is requesting inpatient dual-diagnosis treatment for heroin use and depression.    Diagnosis:  F33.2 Major depressive disorder, Recurrent episode, Severe F11.20 Opioid use disorder, Severe  Past Medical History:  Past Medical History:  Diagnosis Date  . Back pain   . IV drug abuse ([place])     History reviewed. No pertinent surgical history.  Family History: No family history on file.  Social History:  reports that he has been smoking. He has never used smokeless tobacco. He reports current drug use. Drugs: Cocaine, Marijuana, and Heroin. He reports that he does not drink alcohol.  Additional Social History:  Alcohol / Drug Use Pain Medications: Pt has history of abusing opiates Prescriptions: Denies abuse Over the Counter: Denies abuse History of alcohol / drug use?: Yes Longest period of sobriety (when/how long): 1 year Negative Consequences of Use: Financial, Personal relationships, Work / School Withdrawal Symptoms: (Denies current withdrawal) Substance #1 Name of Substance 1: Heroin (I.V.) 1 - Age of First Use: 26 1 - Amount (size/oz): 0.5 grams 1 - Frequency: Daily 1 - Duration: Ongoing 1 - Last Use / Amount: 04/21/19  CIWA: CIWA-Ar BP: 120/83 Pulse Rate: 88 COWS:    Allergies:  Allergies  Allergen Reactions  . Vicodin [Hydrocodone-Acetaminophen] Itching  . Codeine Nausea And Vomiting  . Tramadol Other (See Comments)    "  messes with head"    Home Medications: (Not in a hospital admission)   OB/GYN Status:  No LMP for male  patient.  General Assessment Data [facility] TTS Assessment: In system Is this a Tele or Face-to-Face Assessment?: Tele Assessment Is this an Initial Assessment or a Re-assessment for this encounter?: Initial Assessment Patient Accompanied by:: N/A Language Other than English: No Living Arrangements: Other (Comment)(Staying with a friend) What gender do you identify as?: Male Marital status: Single Maiden name: NA Pregnancy Status: No Living Arrangements: Other (Comment)(Staying with a friend) Can pt return to current living arrangement?: Yes Admission Status: Voluntary Is patient capable of signing voluntary admission?: Yes Referral Source: Self/Family/Friend Insurance type: Self-pay     Crisis Care Plan Living Arrangements: Other (Comment)(Staying with a friend) Legal Guardian: Other:(Self) Name of Psychiatrist: None Name of Therapist: None  Education Status Is patient currently in school?: No Highest grade of school patient has completed: Per chart, graduated high school.  Is the patient employed, unemployed or receiving disability?: Unemployed  Risk to self with the past 6 months Suicidal Ideation: Yes-Currently Present Has patient been a risk to self within the past 6 months prior to admission? : Yes Suicidal Intent: Yes-Currently Present Has patient had any suicidal intent within the past 6 months prior to admission? : Yes Is patient at risk for suicide?: Yes Suicidal Plan?: Yes-Currently Present Has patient had any suicidal plan within the past 6 months prior to admission? : Yes Specify Current Suicidal Plan: Walk into traffic Access to Means: Yes Specify Access to Suicidal Means: Access to traffic What has been your use of drugs/alcohol within the last 12 months?: Pt reports using heroin daily Previous Attempts/Gestures: No How many times?: 0 Other Self Harm Risks: None Triggers for Past Attempts: None known Intentional Self Injurious  Behavior: None Family Suicide History: No Recent stressful life event(s): Financial Problems, Job Loss Persecutory voices/beliefs?: No Depression: Yes Depression Symptoms: Despondent, Insomnia, Tearfulness, Isolating, Fatigue, Guilt, Loss of interest in usual pleasures, Feeling worthless/self pity, Feeling angry/irritable Substance abuse history and/or treatment for substance abuse?: Yes Suicide prevention information given to non-admitted patients: Not applicable  Risk to Others within the past 6 months Homicidal Ideation: No Does patient have any lifetime risk of violence toward others beyond the six months prior to admission? : No Thoughts of Harm to Others: No Current Homicidal Intent: No Current Homicidal Plan: No Access to Homicidal Means: No Identified Victim: None History of harm to others?: No Assessment of Violence: None Noted Violent Behavior Description: Pt denies history of violence Does patient have access to weapons?: No Criminal Charges Pending?: No Does patient have a court date: No Is patient on probation?: No  Psychosis Hallucinations: None noted Delusions: None noted  Mental Status Report Appearance/Hygiene: In scrubs Eye Contact: Good Motor Activity: Unremarkable Speech: Logical/coherent Level of Consciousness: Quiet/awake Mood: Depressed Affect: Depressed Anxiety Level: None Thought Processes: Coherent, Relevant Judgement: Partial Orientation: Person, Place, Situation, Time Obsessive Compulsive Thoughts/Behaviors: None  Cognitive Functioning Concentration: Normal Memory: Recent Intact, Remote Intact Is patient IDD: No Insight: Fair Impulse Control: Fair Appetite: Good Have you had any weight changes? : No Change Sleep: Decreased Total Hours of Sleep: 3 Vegetative Symptoms: None     Prior Inpatient Therapy Prior Inpatient Therapy: Yes Prior Therapy Dates: 01/2015, 2014 [facility] Reason for  Treatment: MDD, SA  Prior Outpatient Therapy Prior Outpatient Therapy: Yes Prior Therapy Dates: Per chart, 2016. Prior Therapy Facilty[facility]Provider([facility] Reason for Treatment: Per chart, Methadone management. Does patient have an ACCT team?: No Does patient have Intensive In-House Services?  : No Does patient have [facility] services? : No Does patient have P4CC services?: No  ADL Screening (condition at time of admission) Is the patient deaf or have difficulty hearing?: No Does the patient have difficulty seeing, even when wearing glasses/contacts?: No Does the patient have difficulty concentrating, remembering, or making decisions?: No Does the patient have difficulty dressing or bathing?: No Does the patient have difficulty walking or climbing stairs?: No Weakness of Legs: None Weakness of Arms/Hands: None  Home Assistive Devices/Equipment Home Assistive Devices/Equipment: None    Abuse/Neglect Assessment (Assessment to be complete while patient is alone) Abuse/Neglect Assessment Can Be Completed: Yes Physical Abuse: Denies Verbal Abuse: Denies Sexual Abuse: Denies Exploitation of patient/patient's resources: Denies Self-Neglect: Denies     Merchant navy officerAdvance Directives (For Healthcare) Does Patient Have a Medical Advance Directive?: No Would patient like information on creating a medical advance directive?: No - Patient declined          Disposition: [facility]innie RailJoAnn Glover[facility], confirmed adult unit is currently at capacity. Gave clinical report to Nira ConnJason Berry, NP who said Pt meets criteria for inpatient psychiatric treatment. TTS will contact other facilities for placement. Notified Kerrie PleasureKelly Gibson, RN who said she would notify EDP of recommendation.  Disposition Initial Assessment Completed for this Encounter: Yes  This service was provided via telemedicine using a 2-way, interactive audio and video technology.  Names of all persons participating in this  telemedicine service and their role in this encounter. Name: Jon Castro Role: Patient  Name: Jon Castro, [facility] Role: TTS counselor         Jon Castro, [facility], [facility], [facility] Triage Specialist [phone]  Jon Castro, Jon Castro 04/22/2019 1:34 AM

## 2019-04-22 NOTE — Progress Notes (Addendum)
This patient continues to meet inpatient criteria. CSW faxed information to the following facilities:   [facility]- bed availability today, will review [facility]- no beds today, at [facility]- no beds today, at capacity, reviewing for admission tomorrow (08/25) [facility] does not have appropriate bed availability  Stephanie Acre, [place] Social Worker

## 2019-04-22 NOTE — ED Notes (Signed)
Pm snack given. Graham crackers, sugar free cookie, and cola

## 2019-04-22 NOTE — ED Notes (Signed)
Pt belongings found at 0700 not inventoried, this NT and Cytogeneticist inventoried pt belongings. Belongings placed in purple zone cabinet locker #10 (top shelf). Valuables given to security and placed in safe.

## 2019-04-22 NOTE — ED Provider Notes (Signed)
[facility] EMERGENCY DEPARTMENT Provider Note   CSN: [id number] Arrival date & time: 04/21/19  2355     History   Chief Complaint Chief Complaint  Patient presents with  . Suicidal    HPI Jon Castro is a 33 y.o. male.      Mental Health Problem Presenting symptoms: suicidal thoughts   Degree of incapacity (severity):  Moderate Onset quality:  Gradual Timing:  Constant Progression:  Worsening Chronicity:  Recurrent Relieved by:  None tried Worsened by:  Nothing Ineffective treatments:  None tried Associated symptoms: appetite change   Associated symptoms: no chest pain     Past Medical History:  Diagnosis Date  . Back pain   . IV drug abuse [facility])     Patient Active Problem List   Diagnosis Date Noted  . Opioid dependence with opioid-induced mood disorder ([place])   . MDD (major depressive disorder), severe ([place]) 02/07/2019    History reviewed. No pertinent surgical history.      Home Medications    Prior to Admission medications   Medication Sig Start Date End Date Taking? Authorizing Provider  cloNIDine (CATAPRES) 0.1 MG tablet Take 1 tablet (0.1 mg total) by mouth 3 (three) times daily for 5 days. Patient not taking: Reported on 03/24/2019 03/08/19 03/24/19  Lennice Sites, DO  traZODone (DESYREL) 50 MG tablet Take 1 tablet (50 mg total) by mouth at bedtime as needed for sleep. Patient not taking: Reported on 02/21/2019 02/11/19 03/24/19  Connye Burkitt, NP    Family History No family history on file.  Social History Social History   Tobacco Use  . Smoking status: Current Some Day Smoker  . Smokeless tobacco: Never Used  Substance Use Topics  . Alcohol use: No  . Drug use: Yes    Types: Cocaine, Marijuana, Heroin     Allergies   Vicodin [hydrocodone-acetaminophen], Codeine, and Tramadol   Review of Systems Review of Systems  Constitutional: Positive for appetite change.  Cardiovascular: Negative for chest pain.   Gastrointestinal: Positive for nausea.  Psychiatric/Behavioral: Positive for suicidal ideas.  All other systems reviewed and are negative.    Physical Exam Updated Vital Signs BP 120/83 (BP Location: Right Arm)   Pulse 88   Temp 99 F (37.2 C) (Oral)   Resp 16   SpO2 99%   Physical Exam Vitals signs and nursing note reviewed.  Constitutional:      Appearance: He is well-developed.  HENT:     Head: Normocephalic and atraumatic.     Mouth/Throat:     Mouth: Mucous membranes are dry.     Pharynx: Oropharynx is clear.  Eyes:     Extraocular Movements: Extraocular movements intact.     Conjunctiva/sclera: Conjunctivae normal.  Neck:     Musculoskeletal: Normal range of motion.  Cardiovascular:     Rate and Rhythm: Normal rate.  Pulmonary:     Effort: Pulmonary effort is normal. No respiratory distress.  Abdominal:     General: There is no distension.  Musculoskeletal: Normal range of motion.        General: No swelling or tenderness.  Skin:    General: Skin is warm and dry.  Neurological:     General: No focal deficit present.     Mental Status: He is alert.      ED Treatments / Results  Labs (all labs ordered are listed, but only abnormal results are displayed) Labs Reviewed  COMPREHENSIVE METABOLIC PANEL - Abnormal; Notable for the following  components:      Result Value   Glucose, Bld 122 (*)    Calcium 8.6 (*)    Albumin 3.2 (*)    AST 213 (*)    ALT 348 (*)    Alkaline Phosphatase 217 (*)    All other components within normal limits  ACETAMINOPHEN LEVEL - Abnormal; Notable for the following components:   Acetaminophen (Tylenol), Serum <10 (*)    All other components within normal limits  CBC - Abnormal; Notable for the following components:   Hemoglobin 12.9 (*)    All other components within normal limits  RAPID URINE DRUG SCREEN, HOSP PERFORMED - Abnormal; Notable for the following components:   Opiates POSITIVE (*)    Cocaine POSITIVE (*)     Tetrahydrocannabinol POSITIVE (*)    All other components within normal limits  ETHANOL  SALICYLATE LEVEL    EKG None  Radiology No results found.  Procedures Procedures (including critical care time)  Medications Ordered in ED Medications  dicyclomine (BENTYL) tablet 20 mg (has no administration in time range)  hydrOXYzine (ATARAX/VISTARIL) tablet 25 mg (has no administration in time range)  loperamide (IMODIUM) capsule 2-4 mg (has no administration in time range)  methocarbamol (ROBAXIN) tablet 500 mg (has no administration in time range)  naproxen (NAPROSYN) tablet 500 mg (has no administration in time range)  ondansetron (ZOFRAN-ODT) disintegrating tablet 4 mg (has no administration in time range)  cloNIDine (CATAPRES) tablet 0.1 mg (has no administration in time range)    Followed by  cloNIDine (CATAPRES) tablet 0.1 mg (has no administration in time range)    Followed by  cloNIDine (CATAPRES) tablet 0.1 mg (has no administration in time range)     Initial Impression / Assessment and Plan / ED Course  I have reviewed the triage vital signs and the nursing notes.  Pertinent labs & imaging results that were available during my care of the patient were reviewed by me and considered in my medical decision making (see chart for details).        Here with suicidal thoughts, medically clear for TTS consultation.  Will institute opiate withdrawal protocol.  Final Clinical Impressions(s) / ED Diagnoses   Final diagnoses:  Suicidal thoughts    ED Discharge Orders    None       Sarabeth Benton, Barbara CowerJason, MD 04/22/19 [phone]

## 2019-04-22 NOTE — ED Triage Notes (Signed)
Pt updated on POC and will talk with TTS tomorrow.

## 2019-04-22 NOTE — Progress Notes (Signed)
[facility] has been [facility] tomorrow (08/25). At this time, the patient is voluntary and will require [facility] transportation.  Accepting Provider: Rushie Nyhan  Bed: [facility] [place]  RN Call for Report: [phone]  Patient may arrive after 9am tomorrow.  Stephanie Acre, LCSW-A Clinical Social Worker

## 2019-04-22 NOTE — ED Notes (Signed)
Pt has been wanded by security, belongings removed, changed into scrubs, meal given. Awaiting evaluation by EDP. Pt in view of nurses station.

## 2019-04-22 NOTE — ED Notes (Signed)
Breakfast ordered--Jon Castro  

## 2019-04-22 NOTE — ED Notes (Signed)
Lunch Tray Ordered @1106 -called by Levada Dy

## 2019-04-22 NOTE — ED Triage Notes (Signed)
Pt says that he "is depressed and thinking about killing myself" Reports he would run in front of traffic. Last heroin use early Friday morning. Calm and cooperative in triage.

## 2019-04-22 NOTE — ED Notes (Signed)
[facility] with behavioral health called, the patient will be inpatient, no beds available.

## 2019-04-23 NOTE — ED Notes (Signed)
Breakfast tray ordered 

## 2019-04-23 NOTE — ED Notes (Signed)
Patient ambulatory to EMS bay, where picked up by [facility] to go to [facility]. All belongings and paperwork given to [facility].

## 2019-04-23 NOTE — ED Provider Notes (Signed)
Patient accepted to [facility], Nicki Reaper, MD 04/23/19 [phone]

## 2019-04-27 ENCOUNTER — Encounter (HOSPITAL_COMMUNITY): Payer: Self-pay | Admitting: Emergency Medicine

## 2019-04-27 ENCOUNTER — Emergency Department (HOSPITAL_COMMUNITY): Payer: Self-pay

## 2019-04-27 ENCOUNTER — Inpatient Hospital Stay (HOSPITAL_COMMUNITY)
Admission: EM | Admit: 2019-04-27 | Discharge: 2019-05-19 | DRG: 854 | Disposition: A | Discharge status: Home / Self Care | Payer: Self-pay | Attending: Internal Medicine | Admitting: Internal Medicine

## 2019-04-27 ENCOUNTER — Other Ambulatory Visit: Payer: Self-pay

## 2019-04-27 DIAGNOSIS — G894 Chronic pain syndrome: Secondary | ICD-10-CM | POA: Diagnosis present

## 2019-04-27 DIAGNOSIS — R945 Abnormal results of liver function studies: Secondary | ICD-10-CM | POA: Diagnosis present

## 2019-04-27 DIAGNOSIS — F322 Major depressive disorder, single episode, severe without psychotic features: Secondary | ICD-10-CM | POA: Diagnosis present

## 2019-04-27 DIAGNOSIS — A4102 Sepsis due to Methicillin resistant Staphylococcus aureus: Principal | ICD-10-CM | POA: Diagnosis present

## 2019-04-27 DIAGNOSIS — R7881 Bacteremia: Secondary | ICD-10-CM

## 2019-04-27 DIAGNOSIS — Z20828 Contact with and (suspected) exposure to other viral communicable diseases: Secondary | ICD-10-CM | POA: Diagnosis present

## 2019-04-27 DIAGNOSIS — B379 Candidiasis, unspecified: Secondary | ICD-10-CM

## 2019-04-27 DIAGNOSIS — L02413 Cutaneous abscess of right upper limb: Secondary | ICD-10-CM | POA: Diagnosis present

## 2019-04-27 DIAGNOSIS — F1990 Other psychoactive substance use, unspecified, uncomplicated: Secondary | ICD-10-CM | POA: Diagnosis present

## 2019-04-27 DIAGNOSIS — L03113 Cellulitis of right upper limb: Secondary | ICD-10-CM | POA: Diagnosis present

## 2019-04-27 DIAGNOSIS — D72829 Elevated white blood cell count, unspecified: Secondary | ICD-10-CM | POA: Diagnosis present

## 2019-04-27 DIAGNOSIS — B49 Unspecified mycosis: Secondary | ICD-10-CM

## 2019-04-27 DIAGNOSIS — Z79899 Other long term (current) drug therapy: Secondary | ICD-10-CM

## 2019-04-27 DIAGNOSIS — F17200 Nicotine dependence, unspecified, uncomplicated: Secondary | ICD-10-CM | POA: Diagnosis present

## 2019-04-27 DIAGNOSIS — R7989 Other specified abnormal findings of blood chemistry: Secondary | ICD-10-CM | POA: Diagnosis present

## 2019-04-27 DIAGNOSIS — R739 Hyperglycemia, unspecified: Secondary | ICD-10-CM | POA: Diagnosis present

## 2019-04-27 DIAGNOSIS — F1910 Other psychoactive substance abuse, uncomplicated: Secondary | ICD-10-CM

## 2019-04-27 DIAGNOSIS — B1710 Acute hepatitis C without hepatic coma: Secondary | ICD-10-CM | POA: Diagnosis present

## 2019-04-27 DIAGNOSIS — F418 Other specified anxiety disorders: Secondary | ICD-10-CM | POA: Diagnosis present

## 2019-04-27 DIAGNOSIS — F1124 Opioid dependence with opioid-induced mood disorder: Secondary | ICD-10-CM | POA: Diagnosis present

## 2019-04-27 LAB — CBC WITH DIFFERENTIAL/PLATELET
Abs Immature Granulocytes: 0.06 10*3/uL (ref 0.00–0.07)
Basophils Absolute: 0.1 10*3/uL (ref 0.0–0.1)
Basophils Relative: 0 %
Eosinophils Absolute: 0.1 10*3/uL (ref 0.0–0.5)
Eosinophils Relative: 1 %
HCT: 39.5 % (ref 39.0–52.0)
Hemoglobin: 12.4 g/dL — ABNORMAL LOW (ref 13.0–17.0)
Immature Granulocytes: 0 %
Lymphocytes Relative: 16 %
Lymphs Abs: 2.5 10*3/uL (ref 0.7–4.0)
MCH: 26.4 pg (ref 26.0–34.0)
MCHC: 31.4 g/dL (ref 30.0–36.0)
MCV: 84.2 fL (ref 80.0–100.0)
Monocytes Absolute: 1.3 10*3/uL — ABNORMAL HIGH (ref 0.1–1.0)
Monocytes Relative: 8 %
Neutro Abs: 12 10*3/uL — ABNORMAL HIGH (ref 1.7–7.7)
Neutrophils Relative %: 75 %
Platelets: 227 10*3/uL (ref 150–400)
RBC: 4.69 MIL/uL (ref 4.22–5.81)
RDW: 15 % (ref 11.5–15.5)
WBC: 16 10*3/uL — ABNORMAL HIGH (ref 4.0–10.5)
nRBC: 0 % (ref 0.0–0.2)

## 2019-04-27 LAB — PROTIME-INR
INR: 1.1 (ref 0.8–1.2)
Prothrombin Time: 14.2 seconds (ref 11.4–15.2)

## 2019-04-27 LAB — LACTIC ACID, PLASMA: Lactic Acid, Venous: 1.6 mmol/L (ref 0.5–1.9)

## 2019-04-27 MED ORDER — CLONIDINE HCL 0.1 MG PO TABS
0.1000 mg | ORAL_TABLET | Freq: Four times a day (QID) | ORAL | Status: AC
  Administered 2019-04-27 – 2019-04-29 (×7): 0.1 mg via ORAL
  Filled 2019-04-27 (×7): qty 1

## 2019-04-27 MED ORDER — ACETAMINOPHEN 500 MG PO TABS
1000.0000 mg | ORAL_TABLET | Freq: Once | ORAL | Status: AC
  Administered 2019-04-27: 23:00:00 1000 mg via ORAL
  Filled 2019-04-27: qty 2

## 2019-04-27 MED ORDER — PIPERACILLIN-TAZOBACTAM 3.375 G IVPB 30 MIN
3.3750 g | Freq: Once | INTRAVENOUS | Status: AC
  Administered 2019-04-27: 23:00:00 3.375 g via INTRAVENOUS
  Filled 2019-04-27: qty 50

## 2019-04-27 MED ORDER — VANCOMYCIN HCL IN DEXTROSE 1-5 GM/200ML-% IV SOLN
1000.0000 mg | Freq: Once | INTRAVENOUS | Status: AC
  Administered 2019-04-28: 1000 mg via INTRAVENOUS
  Filled 2019-04-27: qty 200

## 2019-04-27 MED ORDER — CLONIDINE HCL 0.1 MG PO TABS
0.1000 mg | ORAL_TABLET | Freq: Every day | ORAL | Status: AC
  Administered 2019-05-02 – 2019-05-03 (×2): 0.1 mg via ORAL
  Filled 2019-04-27 (×2): qty 1

## 2019-04-27 MED ORDER — CLONIDINE HCL 0.1 MG PO TABS
0.1000 mg | ORAL_TABLET | Freq: Two times a day (BID) | ORAL | Status: AC
  Administered 2019-04-29 – 2019-05-01 (×4): 0.1 mg via ORAL
  Filled 2019-04-27 (×4): qty 1

## 2019-04-27 MED ORDER — SODIUM CHLORIDE 0.9 % IV BOLUS
30.0000 mL/kg | Freq: Once | INTRAVENOUS | Status: AC
  Administered 2019-04-27: 23:00:00 2250 mL via INTRAVENOUS

## 2019-04-27 MED ORDER — LIDOCAINE-EPINEPHRINE (PF) 2 %-1:200000 IJ SOLN
10.0000 mL | Freq: Once | INTRAMUSCULAR | Status: AC
  Administered 2019-04-27: 10 mL
  Filled 2019-04-27: qty 20

## 2019-04-27 NOTE — ED Provider Notes (Signed)
[facility] EMERGENCY DEPARTMENT Provider Note   CSN: [id number] Arrival date & time: 04/27/19  2237     History   Chief Complaint Chief Complaint  Patient presents with  . Abscess    HPI Jon Castro is a 33 y.o. male.     Patient to ED with complaint of right forearm infection that started about 10 days ago as a small, sore area at the site if IVDU injection. Today, the arm became significantly more swollen, painful, red and he developed a fever.  Complicating his presentation, he was admitted to [facility], released 2 days ago and been on Suboxone while there for heroin addiction. He states his Rx for same was not available at the pharmacy so he has been experiencing symptoms of withdrawal including restlessness/agitation, palpitations. He admits to heroin use this morning to ease symptoms but is again increasingly restless. No nausea, diarrhea, SOB, or body aches.   The history is provided by the patient. No language interpreter was used.    Past Medical History:  Diagnosis Date  . Back pain   . IV drug abuse [facility])     Patient Active Problem List   Diagnosis Date Noted  . Opioid dependence with opioid-induced mood disorder ([place])   . MDD (major depressive disorder), severe ([place]) 02/07/2019    History reviewed. No pertinent surgical history.      Home Medications    Prior to Admission medications   Medication Sig Start Date End Date Taking? Authorizing Provider  cloNIDine (CATAPRES) 0.1 MG tablet Take 1 tablet (0.1 mg total) by mouth 3 (three) times daily for 5 days. Patient not taking: Reported on 03/24/2019 03/08/19 03/24/19  Lennice Sites, DO  traZODone (DESYREL) 50 MG tablet Take 1 tablet (50 mg total) by mouth at bedtime as needed for sleep. Patient not taking: Reported on 02/21/2019 02/11/19 03/24/19  Connye Burkitt, NP    Family History No family history on file.  Social History Social History   Tobacco Use  . Smoking status: Current Some  Day Smoker  . Smokeless tobacco: Never Used  Substance Use Topics  . Alcohol use: No  . Drug use: Yes    Types: Marijuana, Heroin     Allergies   Vicodin [hydrocodone-acetaminophen], Codeine, and Tramadol   Review of Systems Review of Systems  Constitutional: Positive for chills and fever.  Respiratory: Negative.  Negative for cough and shortness of breath.   Cardiovascular: Negative.   Gastrointestinal: Negative.  Negative for nausea.  Musculoskeletal: Negative for myalgias.       See HPI.  Skin: Positive for color change and wound.  Neurological: Negative.  Negative for numbness.     Physical Exam Updated Vital Signs BP 108/67   Pulse (!) 153   Temp (!) 103 F (39.4 C) (Oral)   Resp 18   Ht 6\' 1"  (1.854 m)   Wt 75 kg   SpO2 95%   BMI 21.81 kg/m   Physical Exam Vitals signs and nursing note reviewed.  Constitutional:      Appearance: He is well-developed.  HENT:     Head: Normocephalic.  Neck:     Musculoskeletal: Normal range of motion and neck supple.  Cardiovascular:     Rate and Rhythm: Regular rhythm. Tachycardia present.  Pulmonary:     Effort: Pulmonary effort is normal.     Breath sounds: Normal breath sounds. No wheezing, rhonchi or rales.  Abdominal:     General: Bowel sounds are normal.  Palpations: Abdomen is soft.     Tenderness: There is no abdominal tenderness. There is no guarding or rebound.  Musculoskeletal: Normal range of motion.     Comments: Significant, generalized right forearm swelling, redness, warmth and induration with large fluctuant area proximal volar aspect c/w abscess. No active drainage.   Skin:    General: Skin is warm and dry.     Findings: No rash.  Neurological:     Mental Status: He is alert and oriented to person, place, and time.     Sensory: No sensory deficit.      ED Treatments / Results  Labs (all labs ordered are listed, but only abnormal results are displayed) Labs Reviewed  CULTURE, BLOOD  (ROUTINE X 2)  CULTURE, BLOOD (ROUTINE X 2)  COMPREHENSIVE METABOLIC PANEL  LACTIC ACID, PLASMA  LACTIC ACID, PLASMA  CBC WITH DIFFERENTIAL/PLATELET  PROTIME-INR    EKG None  Radiology No results found.  Procedures .Marland Kitchen.Incision and Drainage  Date/Time: 04/28/2019 1:16 AM Performed by: Elpidio AnisUpstill, Meghanne Pletz, PA-C Authorized by: Elpidio AnisUpstill, Hadley Detloff, PA-C   Consent:    Consent obtained:  Verbal   Consent given by:  Patient Location:    Type:  Abscess   Location:  Upper extremity   Upper extremity location:  Arm   Arm location:  R lower arm Pre-procedure details:    Skin preparation:  Betadine Anesthesia (see MAR for exact dosages):    Anesthesia method:  Local infiltration   Local anesthetic:  Lidocaine 2% WITH epi Procedure type:    Complexity:  Simple Procedure details:    Needle aspiration: no     Incision types:  Single straight   Scalpel blade:  11   Wound management:  Probed and deloculated and irrigated with saline   Drainage:  Bloody and purulent   Drainage amount:  Moderate   Wound treatment:  Wound left open   Packing materials:  None Post-procedure details:    Patient tolerance of procedure:  Tolerated well, no immediate complications   (including critical care time)  Medications Ordered in ED Medications  sodium chloride 0.9 % bolus 2,250 mL (has no administration in time range)  acetaminophen (TYLENOL) tablet 1,000 mg (has no administration in time range)  vancomycin (VANCOCIN) IVPB 1000 mg/200 mL premix (has no administration in time range)  piperacillin-tazobactam (ZOSYN) IVPB 3.375 g (has no administration in time range)     Initial Impression / Assessment and Plan / ED Course  I have reviewed the triage vital signs and the nursing notes.  Pertinent labs & imaging results that were available during my care of the patient were reviewed by me and considered in my medical decision making (see chart for details).        Patient to ED with abscess of  right FA at site of IVDU, x 10 days, fever and increased pain/swelling today.   Leukocytosis of 16. Lactate is negative. IV fluids at 30 ml/mg running. Clonidine protocol in place considering possible contributing opiate withdrawal. The patient is given a single dose Percocet for pain.   I&D performed. Vanc and Zosyn are running. He will need admission for IV antibiotics and observation.   Discussed with Dr. Mikeal HawthorneGarba who accepts the patient for admission.  Final Clinical Impressions(s) / ED Diagnoses   Final diagnoses:  None   1. Right forearm abscess 2. IVDA  ED Discharge Orders    None       Elpidio AnisUpstill, Onyx Schirmer, Cordelia Poche-C 04/28/19 [id number]    Horton, Mayer Maskerourtney F,  MD 04/29/19 0962

## 2019-04-27 NOTE — ED Triage Notes (Signed)
Pt c/o abscess to R forearm increasing in size and fever since last night. Pt released from [facility] yesterday and was unable to get his Suboxone, did use Heroin today @ 10am.

## 2019-04-28 ENCOUNTER — Inpatient Hospital Stay (HOSPITAL_COMMUNITY): Payer: Self-pay | Admitting: Anesthesiology

## 2019-04-28 ENCOUNTER — Encounter (HOSPITAL_COMMUNITY)
Admission: EM | Disposition: A | Discharge status: Home / Self Care | Payer: Self-pay | Source: Home / Self Care | Attending: Internal Medicine

## 2019-04-28 ENCOUNTER — Inpatient Hospital Stay (HOSPITAL_COMMUNITY): Payer: Self-pay

## 2019-04-28 DIAGNOSIS — D72829 Elevated white blood cell count, unspecified: Secondary | ICD-10-CM | POA: Diagnosis present

## 2019-04-28 DIAGNOSIS — R7989 Other specified abnormal findings of blood chemistry: Secondary | ICD-10-CM | POA: Diagnosis present

## 2019-04-28 DIAGNOSIS — R739 Hyperglycemia, unspecified: Secondary | ICD-10-CM | POA: Diagnosis present

## 2019-04-28 DIAGNOSIS — L03113 Cellulitis of right upper limb: Secondary | ICD-10-CM

## 2019-04-28 DIAGNOSIS — R945 Abnormal results of liver function studies: Secondary | ICD-10-CM | POA: Diagnosis present

## 2019-04-28 DIAGNOSIS — L02413 Cutaneous abscess of right upper limb: Secondary | ICD-10-CM | POA: Diagnosis present

## 2019-04-28 HISTORY — PX: I & D EXTREMITY: SHX5045

## 2019-04-28 LAB — COMPREHENSIVE METABOLIC PANEL
ALT: 325 U/L — ABNORMAL HIGH (ref 0–44)
ALT: 253 U/L — ABNORMAL HIGH (ref 0–44)
AST: 184 U/L — ABNORMAL HIGH (ref 15–41)
AST: 128 U/L — ABNORMAL HIGH (ref 15–41)
Albumin: 3.3 g/dL — ABNORMAL LOW (ref 3.5–5.0)
Albumin: 2.7 g/dL — ABNORMAL LOW (ref 3.5–5.0)
Alkaline Phosphatase: 162 U/L — ABNORMAL HIGH (ref 38–126)
Alkaline Phosphatase: 128 U/L — ABNORMAL HIGH (ref 38–126)
Anion gap: 9 (ref 5–15)
Anion gap: 8 (ref 5–15)
BUN: 11 mg/dL (ref 6–20)
BUN: 9 mg/dL (ref 6–20)
CO2: 24 mmol/L (ref 22–32)
CO2: 23 mmol/L (ref 22–32)
Calcium: 8.5 mg/dL — ABNORMAL LOW (ref 8.9–10.3)
Calcium: 7.7 mg/dL — ABNORMAL LOW (ref 8.9–10.3)
Chloride: 100 mmol/L (ref 98–111)
Chloride: 103 mmol/L (ref 98–111)
Creatinine, Ser: 1.03 mg/dL (ref 0.61–1.24)
Creatinine, Ser: 0.97 mg/dL (ref 0.61–1.24)
GFR calc Af Amer: >60 mL/min (ref >60)
GFR calc Af Amer: >60 mL/min (ref >60)
GFR calc non Af Amer: >60 mL/min (ref >60)
GFR calc non Af Amer: >60 mL/min (ref >60)
Glucose, Bld: 206 mg/dL — ABNORMAL HIGH (ref 70–99)
Glucose, Bld: 137 mg/dL — ABNORMAL HIGH (ref 70–99)
Potassium: 4.1 mmol/L (ref 3.5–5.1)
Potassium: 3.9 mmol/L (ref 3.5–5.1)
Sodium: 133 mmol/L — ABNORMAL LOW (ref 135–145)
Sodium: 134 mmol/L — ABNORMAL LOW (ref 135–145)
Total Bilirubin: 1.6 mg/dL — ABNORMAL HIGH (ref 0.3–1.2)
Total Bilirubin: 1.6 mg/dL — ABNORMAL HIGH (ref 0.3–1.2)
Total Protein: 6.5 g/dL (ref 6.5–8.1)
Total Protein: 5.5 g/dL — ABNORMAL LOW (ref 6.5–8.1)

## 2019-04-28 LAB — BLOOD CULTURE ID PANEL (REFLEXED)

## 2019-04-28 LAB — CBC
HCT: 34.9 % — ABNORMAL LOW (ref 39.0–52.0)
Hemoglobin: 10.8 g/dL — ABNORMAL LOW (ref 13.0–17.0)
MCH: 26.2 pg (ref 26.0–34.0)
MCHC: 30.9 g/dL (ref 30.0–36.0)
MCV: 84.5 fL (ref 80.0–100.0)
Platelets: 152 10*3/uL (ref 150–400)
RBC: 4.13 MIL/uL — ABNORMAL LOW (ref 4.22–5.81)
RDW: 15 % (ref 11.5–15.5)
WBC: 14.6 10*3/uL — ABNORMAL HIGH (ref 4.0–10.5)
nRBC: 0 % (ref 0.0–0.2)

## 2019-04-28 LAB — LACTIC ACID, PLASMA: Lactic Acid, Venous: 1.1 mmol/L (ref 0.5–1.9)

## 2019-04-28 LAB — SARS CORONAVIRUS 2 BY RT PCR (HOSPITAL ORDER, PERFORMED IN ~~LOC~~ HOSPITAL LAB): SARS Coronavirus 2: NEGATIVE

## 2019-04-28 LAB — MRSA PCR SCREENING: MRSA by PCR: POSITIVE — AB

## 2019-04-28 LAB — SARS CORONAVIRUS 2 (TAT 6-24 HRS): SARS Coronavirus 2: NEGATIVE

## 2019-04-28 SURGERY — IRRIGATION AND DEBRIDEMENT EXTREMITY
Anesthesia: General | Site: Arm Lower | Laterality: Right

## 2019-04-28 MED ORDER — FENTANYL CITRATE (PF) 250 MCG/5ML IJ SOLN
INTRAMUSCULAR | Status: AC
  Filled 2019-04-28: qty 5

## 2019-04-28 MED ORDER — SUCCINYLCHOLINE CHLORIDE 20 MG/ML IJ SOLN
INTRAMUSCULAR | Status: DC | PRN
  Administered 2019-04-28: 120 mg via INTRAVENOUS

## 2019-04-28 MED ORDER — OXYCODONE HCL 5 MG PO TABS: 5.0000 mg | ORAL_TABLET | Freq: Four times a day (QID) | ORAL | 0 refills | Status: DC | PRN

## 2019-04-28 MED ORDER — FENTANYL CITRATE (PF) 100 MCG/2ML IJ SOLN
INTRAMUSCULAR | Status: AC
  Filled 2019-04-28: qty 2

## 2019-04-28 MED ORDER — MIDAZOLAM HCL 5 MG/5ML IJ SOLN
INTRAMUSCULAR | Status: DC | PRN
  Administered 2019-04-28: 2 mg via INTRAVENOUS

## 2019-04-28 MED ORDER — OXYCODONE HCL 5 MG PO TABS
5.0000 mg | ORAL_TABLET | Freq: Four times a day (QID) | ORAL | Status: DC | PRN
  Administered 2019-04-28 – 2019-05-05 (×20): 10 mg via ORAL
  Filled 2019-04-28 (×21): qty 2

## 2019-04-28 MED ORDER — IBUPROFEN 600 MG PO TABS: 600.0000 mg | ORAL_TABLET | Freq: Three times a day (TID) | ORAL | 0 refills | Status: DC | PRN

## 2019-04-28 MED ORDER — METHOCARBAMOL 1000 MG/10ML IJ SOLN
500.0000 mg | Freq: Four times a day (QID) | INTRAVENOUS | Status: DC | PRN
  Filled 2019-04-28: qty 5

## 2019-04-28 MED ORDER — DIPHENHYDRAMINE HCL 12.5 MG/5ML PO ELIX
12.5000 mg | ORAL_SOLUTION | ORAL | Status: DC | PRN
  Administered 2019-05-10 – 2019-05-19 (×13): 25 mg via ORAL
  Filled 2019-04-28 (×13): qty 10

## 2019-04-28 MED ORDER — ACETAMINOPHEN 325 MG PO TABS
325.0000 mg | ORAL_TABLET | Freq: Four times a day (QID) | ORAL | Status: DC | PRN
  Administered 2019-05-01 – 2019-05-10 (×2): 650 mg via ORAL
  Filled 2019-04-28 (×2): qty 2

## 2019-04-28 MED ORDER — KETAMINE HCL 50 MG/5ML IJ SOSY
PREFILLED_SYRINGE | INTRAMUSCULAR | Status: AC
  Filled 2019-04-28: qty 5

## 2019-04-28 MED ORDER — POLYETHYLENE GLYCOL 3350 17 G PO PACK
17.0000 g | PACK | Freq: Every day | ORAL | Status: DC | PRN
  Filled 2019-04-28: qty 1

## 2019-04-28 MED ORDER — ACETAMINOPHEN 325 MG PO TABS
650.0000 mg | ORAL_TABLET | Freq: Four times a day (QID) | ORAL | Status: DC | PRN
  Administered 2019-04-28: 15:00:00 650 mg via ORAL
  Filled 2019-04-28: qty 2

## 2019-04-28 MED ORDER — FENTANYL CITRATE (PF) 100 MCG/2ML IJ SOLN
INTRAMUSCULAR | Status: DC | PRN
  Administered 2019-04-28 (×5): 50 ug via INTRAVENOUS

## 2019-04-28 MED ORDER — PROPOFOL 10 MG/ML IV BOLUS
INTRAVENOUS | Status: AC
  Filled 2019-04-28: qty 20

## 2019-04-28 MED ORDER — MIDAZOLAM HCL 2 MG/2ML IJ SOLN
INTRAMUSCULAR | Status: AC
  Filled 2019-04-28: qty 2

## 2019-04-28 MED ORDER — LACTATED RINGERS IV SOLN
INTRAVENOUS | Status: DC | PRN
  Administered 2019-04-28: 20:00:00 via INTRAVENOUS

## 2019-04-28 MED ORDER — SUGAMMADEX SODIUM 200 MG/2ML IV SOLN
INTRAVENOUS | Status: DC | PRN
  Administered 2019-04-28: 200 mg via INTRAVENOUS

## 2019-04-28 MED ORDER — PROPOFOL 10 MG/ML IV BOLUS
INTRAVENOUS | Status: DC | PRN
  Administered 2019-04-28: 200 mg via INTRAVENOUS

## 2019-04-28 MED ORDER — ACETAMINOPHEN 10 MG/ML IV SOLN
1000.0000 mg | Freq: Once | INTRAVENOUS | Status: DC | PRN
  Administered 2019-04-28: 21:00:00 1000 mg via INTRAVENOUS

## 2019-04-28 MED ORDER — LIDOCAINE 2% (20 MG/ML) 5 ML SYRINGE
INTRAMUSCULAR | Status: DC | PRN
  Administered 2019-04-28: 90 mg via INTRAVENOUS

## 2019-04-28 MED ORDER — PROMETHAZINE HCL 25 MG/ML IJ SOLN: 6.2500 mg | INTRAMUSCULAR | Status: DC | PRN

## 2019-04-28 MED ORDER — ACETAMINOPHEN 500 MG PO TABS: 1000.0000 mg | ORAL_TABLET | Freq: Three times a day (TID) | ORAL | 0 refills | Status: DC

## 2019-04-28 MED ORDER — METHOCARBAMOL 500 MG PO TABS
ORAL_TABLET | ORAL | Status: AC
  Filled 2019-04-28: qty 1

## 2019-04-28 MED ORDER — ROCURONIUM BROMIDE 50 MG/5ML IV SOSY
PREFILLED_SYRINGE | INTRAVENOUS | Status: DC | PRN
  Administered 2019-04-28: 30 mg via INTRAVENOUS

## 2019-04-28 MED ORDER — CEFAZOLIN SODIUM-DEXTROSE 2-3 GM-%(50ML) IV SOLR
INTRAVENOUS | Status: DC | PRN
  Administered 2019-04-28: 2 g via INTRAVENOUS

## 2019-04-28 MED ORDER — SODIUM CHLORIDE 0.9 % IV SOLN
INTRAVENOUS | Status: DC
  Administered 2019-04-28 – 2019-05-03 (×6): via INTRAVENOUS

## 2019-04-28 MED ORDER — CEFAZOLIN SODIUM-DEXTROSE 2-4 GM/100ML-% IV SOLN
INTRAVENOUS | Status: AC
  Filled 2019-04-28: qty 100

## 2019-04-28 MED ORDER — ONDANSETRON HCL 4 MG PO TABS: 4.0000 mg | ORAL_TABLET | Freq: Four times a day (QID) | ORAL | Status: DC | PRN

## 2019-04-28 MED ORDER — VANCOMYCIN HCL IN DEXTROSE 1-5 GM/200ML-% IV SOLN
1000.0000 mg | Freq: Three times a day (TID) | INTRAVENOUS | Status: DC
  Administered 2019-04-28 – 2019-04-30 (×8): 1000 mg via INTRAVENOUS
  Filled 2019-04-28 (×9): qty 200

## 2019-04-28 MED ORDER — MUPIROCIN 2 % EX OINT
1.0000 "application " | TOPICAL_OINTMENT | Freq: Two times a day (BID) | CUTANEOUS | Status: AC
  Administered 2019-04-28 – 2019-05-03 (×10): 1 via NASAL
  Filled 2019-04-28: qty 22

## 2019-04-28 MED ORDER — BUPIVACAINE HCL (PF) 0.25 % IJ SOLN
INTRAMUSCULAR | Status: AC
  Filled 2019-04-28: qty 30

## 2019-04-28 MED ORDER — 0.9 % SODIUM CHLORIDE (POUR BTL) OPTIME
TOPICAL | Status: DC | PRN
  Administered 2019-04-28: 19:00:00 1000 mL

## 2019-04-28 MED ORDER — HYDROMORPHONE HCL 1 MG/ML IJ SOLN
1.0000 mg | INTRAMUSCULAR | Status: AC | PRN
  Administered 2019-04-29: 01:00:00 1 mg via INTRAVENOUS
  Filled 2019-04-28: qty 1

## 2019-04-28 MED ORDER — CHLORHEXIDINE GLUCONATE CLOTH 2 % EX PADS
6.0000 | MEDICATED_PAD | Freq: Every day | CUTANEOUS | Status: AC
  Administered 2019-04-30 – 2019-05-03 (×4): 6 via TOPICAL

## 2019-04-28 MED ORDER — DEXAMETHASONE SODIUM PHOSPHATE 4 MG/ML IJ SOLN
INTRAMUSCULAR | Status: DC | PRN
  Administered 2019-04-28: 5 mg via INTRAVENOUS

## 2019-04-28 MED ORDER — KETAMINE HCL 10 MG/ML IJ SOLN
INTRAMUSCULAR | Status: DC | PRN
  Administered 2019-04-28 (×2): 10 mg via INTRAVENOUS

## 2019-04-28 MED ORDER — GABAPENTIN 300 MG PO CAPS: 300.0000 mg | ORAL_CAPSULE | Freq: Two times a day (BID) | ORAL | 0 refills | Status: DC

## 2019-04-28 MED ORDER — KETOROLAC TROMETHAMINE 30 MG/ML IJ SOLN
30.0000 mg | Freq: Four times a day (QID) | INTRAMUSCULAR | Status: AC | PRN
  Administered 2019-04-28 – 2019-05-01 (×6): 30 mg via INTRAVENOUS
  Filled 2019-04-28 (×7): qty 1

## 2019-04-28 MED ORDER — ONDANSETRON HCL 4 MG/2ML IJ SOLN: 4.0000 mg | Freq: Four times a day (QID) | INTRAMUSCULAR | Status: DC | PRN

## 2019-04-28 MED ORDER — SODIUM CHLORIDE 0.9 % IR SOLN
Status: DC | PRN
  Administered 2019-04-28: 3000 mL

## 2019-04-28 MED ORDER — DOCUSATE SODIUM 100 MG PO CAPS
100.0000 mg | ORAL_CAPSULE | Freq: Two times a day (BID) | ORAL | Status: DC
  Administered 2019-04-28 – 2019-05-18 (×34): 100 mg via ORAL
  Filled 2019-04-28 (×37): qty 1

## 2019-04-28 MED ORDER — FENTANYL CITRATE (PF) 100 MCG/2ML IJ SOLN
25.0000 ug | INTRAMUSCULAR | Status: DC | PRN
  Administered 2019-04-28 (×3): 50 ug via INTRAVENOUS

## 2019-04-28 MED ORDER — SODIUM CHLORIDE 0.9 % IV SOLN
INTRAVENOUS | Status: DC | PRN
  Administered 2019-04-28: 50 ug/min via INTRAVENOUS

## 2019-04-28 MED ORDER — OXYCODONE-ACETAMINOPHEN 5-325 MG PO TABS
1.0000 | ORAL_TABLET | Freq: Once | ORAL | Status: AC
  Administered 2019-04-28: 03:00:00 1 via ORAL
  Filled 2019-04-28: qty 1

## 2019-04-28 MED ORDER — ONDANSETRON HCL 4 MG/2ML IJ SOLN
INTRAMUSCULAR | Status: DC | PRN
  Administered 2019-04-28: 4 mg via INTRAVENOUS

## 2019-04-28 MED ORDER — IOHEXOL 300 MG/ML  SOLN
100.0000 mL | Freq: Once | INTRAMUSCULAR | Status: AC | PRN
  Administered 2019-04-28: 03:00:00 100 mL via INTRAVENOUS

## 2019-04-28 MED ORDER — GABAPENTIN 300 MG PO CAPS
300.0000 mg | ORAL_CAPSULE | Freq: Three times a day (TID) | ORAL | Status: DC
  Administered 2019-04-28 – 2019-05-19 (×62): 300 mg via ORAL
  Filled 2019-04-28 (×62): qty 1

## 2019-04-28 MED ORDER — ACETAMINOPHEN 500 MG PO TABS
1000.0000 mg | ORAL_TABLET | Freq: Three times a day (TID) | ORAL | Status: DC
  Administered 2019-04-29 (×3): 1000 mg via ORAL
  Filled 2019-04-28 (×3): qty 2

## 2019-04-28 MED ORDER — ACETAMINOPHEN 10 MG/ML IV SOLN
INTRAVENOUS | Status: AC
  Filled 2019-04-28: qty 100

## 2019-04-28 MED ORDER — METHOCARBAMOL 500 MG PO TABS
500.0000 mg | ORAL_TABLET | Freq: Four times a day (QID) | ORAL | Status: DC | PRN
  Administered 2019-04-28 – 2019-05-18 (×33): 500 mg via ORAL
  Filled 2019-04-28 (×32): qty 1

## 2019-04-28 SURGICAL SUPPLY — 50 items
BLADE SURG 10 STRL SS (BLADE) ×3 IMPLANT
BNDG CMPR 9X4 STRL LF SNTH (GAUZE/BANDAGES/DRESSINGS) ×1
BNDG ELASTIC 3X5.8 VLCR STR LF (GAUZE/BANDAGES/DRESSINGS) ×2 IMPLANT
BNDG ELASTIC 4X5.8 VLCR STR LF (GAUZE/BANDAGES/DRESSINGS) ×3 IMPLANT
BNDG ESMARK 4X9 LF (GAUZE/BANDAGES/DRESSINGS) ×2 IMPLANT
BNDG GAUZE ELAST 4 BULKY (GAUZE/BANDAGES/DRESSINGS) ×6 IMPLANT
CONT SPEC 4OZ CLIKSEAL STRL BL (MISCELLANEOUS) ×2 IMPLANT
COVER SURGICAL LIGHT HANDLE (MISCELLANEOUS) ×3 IMPLANT
CUFF TOURN SGL QUICK 18X4 (TOURNIQUET CUFF) ×2 IMPLANT
DRAPE IMP U-DRAPE 54X76 (DRAPES) ×2 IMPLANT
DRAPE SURG 17X23 STRL (DRAPES) ×2 IMPLANT
DRAPE U-SHAPE 47X51 STRL (DRAPES) ×2 IMPLANT
DRSG ADAPTIC 3X8 NADH LF (GAUZE/BANDAGES/DRESSINGS) ×2 IMPLANT
DRSG PAD ABDOMINAL 8X10 ST (GAUZE/BANDAGES/DRESSINGS) ×1 IMPLANT
ELECT REM PT RETURN 9FT ADLT (ELECTROSURGICAL) ×3
ELECTRODE REM PT RTRN 9FT ADLT (ELECTROSURGICAL) IMPLANT
FACESHIELD WRAPAROUND (MASK) ×3 IMPLANT
FACESHIELD WRAPAROUND OR TEAM (MASK) ×1 IMPLANT
GAUZE SPONGE 4X4 12PLY STRL (GAUZE/BANDAGES/DRESSINGS) ×3 IMPLANT
GAUZE XEROFORM 1X8 LF (GAUZE/BANDAGES/DRESSINGS) ×1 IMPLANT
GLOVE BIO SURGEON STRL SZ7.5 (GLOVE) ×6 IMPLANT
GLOVE BIOGEL PI IND STRL 8 (GLOVE) ×2 IMPLANT
GLOVE BIOGEL PI INDICATOR 8 (GLOVE) ×4
GOWN STRL REUS W/ TWL LRG LVL3 (GOWN DISPOSABLE) ×3 IMPLANT
GOWN STRL REUS W/TWL LRG LVL3 (GOWN DISPOSABLE) ×9
HANDPIECE INTERPULSE COAX TIP (DISPOSABLE)
KIT BASIN OR (CUSTOM PROCEDURE TRAY) ×3 IMPLANT
KIT TURNOVER KIT B (KITS) ×3 IMPLANT
MANIFOLD NEPTUNE II (INSTRUMENTS) ×3 IMPLANT
NDL HYPO 25GX1X1/2 BEV (NEEDLE) IMPLANT
NEEDLE HYPO 25GX1X1/2 BEV (NEEDLE) IMPLANT
NS IRRIG 1000ML POUR BTL (IV SOLUTION) ×3 IMPLANT
PACK ORTHO EXTREMITY (CUSTOM PROCEDURE TRAY) ×3 IMPLANT
PAD ABD 8X10 STRL (GAUZE/BANDAGES/DRESSINGS) ×6 IMPLANT
PAD ARMBOARD 7.5X6 YLW CONV (MISCELLANEOUS) ×4 IMPLANT
PAD CAST 4YDX4 CTTN HI CHSV (CAST SUPPLIES) IMPLANT
PADDING CAST COTTON 4X4 STRL (CAST SUPPLIES) ×12
SET CYSTO W/LG BORE CLAMP LF (SET/KITS/TRAYS/PACK) ×2 IMPLANT
SET HNDPC FAN SPRY TIP SCT (DISPOSABLE) IMPLANT
SPONGE LAP 18X18 RF (DISPOSABLE) IMPLANT
STOCKINETTE IMPERVIOUS 9X36 MD (GAUZE/BANDAGES/DRESSINGS) ×3 IMPLANT
SUT ETHILON 3 0 PS 1 (SUTURE) ×6 IMPLANT
SUT PDS AB 2-0 CT1 27 (SUTURE) IMPLANT
SYR CONTROL 10ML LL (SYRINGE) IMPLANT
TOWEL GREEN STERILE (TOWEL DISPOSABLE) ×3 IMPLANT
TOWEL GREEN STERILE FF (TOWEL DISPOSABLE) ×3 IMPLANT
TUBE CONNECTING 12'X1/4 (SUCTIONS) ×1
TUBE CONNECTING 12X1/4 (SUCTIONS) ×2 IMPLANT
UNDERPAD 30X30 (UNDERPADS AND DIAPERS) ×3 IMPLANT
YANKAUER SUCT BULB TIP NO VENT (SUCTIONS) ×3 IMPLANT

## 2019-04-28 NOTE — Progress Notes (Signed)
Received patient from PACU in stable condition ,alert and oriented x 4,moving fingers on right hand with full range of motion. Denies numbness,tingling. Good cap refill. Instructed patient to open and close right hand frequently to promote good circulation. Pt voided in bathroom and asked for something to eat.

## 2019-04-28 NOTE — Progress Notes (Signed)
Pharmacy Antibiotic Note  Zymarion Favorite is a 33 y.o. male admitted on 04/27/2019 with RUE abcess/cellulitis.  Pharmacy has been consulted for Vancomycin  Dosing.  Vancomycin 1 g IV given in ED at [place]: Vancomycin 1 g IV q8h  Height: 6\' 1"  (185.4 cm) Weight: 170 lb (77.1 kg) IBW/kg (Calculated) : 79.9  Temp (24hrs), Avg:103 F (39.4 C), Min:103 F (39.4 C), Max:103 F (39.4 C)  Recent Labs  Lab 04/22/19 0041 04/27/19 2253  WBC 8.7 16.0*  CREATININE 0.86 1.03  LATICACIDVEN  --  1.6    Estimated Creatinine Clearance: 110.2 mL/min (by C-G formula based on SCr of 1.03 mg/dL).    Allergies  Allergen Reactions  . Vicodin [Hydrocodone-Acetaminophen] Itching  . Codeine Nausea And Vomiting  . Tramadol Other (See Comments)    "messes with head"    Caryl Pina 04/28/2019 1:39 AM

## 2019-04-28 NOTE — Interval H&P Note (Signed)
I participated in the care of this patient and agree with the above history, physical and evaluation. I performed a review of the history and a physical exam as detailed   Calven Gilkes Daniel Yalanda Soderman MD  

## 2019-04-28 NOTE — Anesthesia Preprocedure Evaluation (Addendum)
Anesthesia Evaluation  Patient identified by MRN, date of birth, ID band Patient awake    Reviewed: Allergy & Precautions, NPO status , Patient's Chart, lab work & pertinent test results  Airway Mallampati: III  TM Distance: >3 FB Neck ROM: Full    Dental  (+) Dental Advisory Given, Missing   Pulmonary Current Smoker and Patient abstained from smoking.,    Pulmonary exam normal breath sounds clear to auscultation       Cardiovascular negative cardio ROS Normal cardiovascular exam Rhythm:Regular Rate:Normal     Neuro/Psych PSYCHIATRIC DISORDERS Depression negative neurological ROS     GI/Hepatic negative GI ROS, (+)     substance abuse  IV drug use,   Endo/Other  negative endocrine ROS  Renal/GU negative Renal ROS     Musculoskeletal negative musculoskeletal ROS (+) narcotic dependent  Abdominal   Peds  Hematology  (+) Blood dyscrasia, anemia ,   Anesthesia Other Findings Day of surgery medications reviewed with the patient.  Reproductive/Obstetrics                            Anesthesia Physical Anesthesia Plan  ASA: II and emergent  Anesthesia Plan: General   Post-op Pain Management:    Induction: Intravenous  PONV Risk Score and Plan: 2 and Midazolam, Ondansetron and Dexamethasone  Airway Management Planned: Oral ETT  Additional Equipment:   Intra-op Plan:   Post-operative Plan: Extubation in OR  Informed Consent: I have reviewed the patients History and Physical, chart, labs and discussed the procedure including the risks, benefits and alternatives for the proposed anesthesia with the patient or authorized representative who has indicated his/her understanding and acceptance.     Dental advisory given  Plan Discussed with: CRNA  Anesthesia Plan Comments:         Anesthesia Quick Evaluation

## 2019-04-28 NOTE — Anesthesia Procedure Notes (Signed)
Procedure Name: Intubation Date/Time: 04/28/2019 7:44 PM Performed by: Suzy Bouchard, CRNA Pre-anesthesia Checklist: Patient identified, Emergency Drugs available, Suction available, Patient being monitored and Timeout performed Patient Re-evaluated:Patient Re-evaluated prior to induction Oxygen Delivery Method: Circle system utilized Preoxygenation: Pre-oxygenation with 100% oxygen Induction Type: IV induction and Rapid sequence Laryngoscope Size: Glidescope and 4 Grade View: Grade I Tube type: Oral Tube size: 7.5 mm Number of attempts: 1 Airway Equipment and Method: Stylet and Video-laryngoscopy Placement Confirmation: ETT inserted through vocal cords under direct vision,  positive ETCO2 and breath sounds checked- equal and bilateral Secured at: 22 cm Tube secured with: Tape Dental Injury: Teeth and Oropharynx as per pre-operative assessment

## 2019-04-28 NOTE — Anesthesia Postprocedure Evaluation (Signed)
Anesthesia Post Note  Patient: Jon Castro  Procedure(s) Performed: IRRIGATION AND DEBRIDEMENT ARM ABSCESS (Right Arm Lower)     Patient location during evaluation: PACU Anesthesia Type: General Level of consciousness: awake and alert, oriented and awake Pain management: pain level controlled Vital Signs Assessment: post-procedure vital signs reviewed and stable Respiratory status: spontaneous breathing, nonlabored ventilation and respiratory function stable Cardiovascular status: blood pressure returned to baseline and stable Postop Assessment: no apparent nausea or vomiting Anesthetic complications: no    Last Vitals:  Vitals:   04/28/19 2130 04/28/19 2155  BP:  100/65  Pulse:  81  Resp:  16  Temp: 36.9 C 36.8 C  SpO2:  98%    Last Pain:  Vitals:   04/28/19 2155  TempSrc: Oral  PainSc:                  Catalina Gravel

## 2019-04-28 NOTE — Progress Notes (Signed)
PROGRESS NOTE    Jon Castro  OTL:[id number] DOB: 1986-02-16 DOA: 04/27/2019 PCP: Patient, [name] Per   Brief Narrative:  Jon Castro is a 33 y.o. male with medical history significant of IV drug abuse, chronic pain syndrome, recent suicide attempt with transfer to behavioral health who is on Suboxone and apparently unable to get the clinic and get his Suboxone so he used heroin today.  Since he was released from behavioral health he has noticed increase in size of a lesion on his right forearm with fever.  Now the whole hand is swollen tender painful.  Patient was seen in the ER suspected abscess.  Initial I&D done superficially and area dressed.  He is being admitted with cellulitis.  Patient's pain is 9 out of 10 at the moment.  He attempted to shoot the heroin in the area.  No contact with anybody was covered.  No previous bacteremia or sepsis denied any MRSA infections.  Patient is being admitted for IV antibiotic and supportive care. In ED temperature 103 blood pressure 97/61 pulse 153 respiratory rate of 20 oxygen sat 95% room air.  White count 16,000 hemoglobin 12.4 and platelet 227.  Sodium 133 potassium 4.1 chloride 100 CO2 24 with BUN 11 and creatinine 1.03.  Calcium 8.5 and glucose 206 INR 1.1.  Patient given IV Zosyn with initial incision and drainage and is being admitted to the hospital for treatment   Assessment & Plan:   Principal Problem:   Right forearm cellulitis Active Problems:   MDD (major depressive disorder), severe ([facility])   Opioid dependence with opioid-induced mood disorder ([facility])   Hyperglycemia   Leucocytosis   LFTs abnormal   Sepsis in the setting of acute/subacute cellulitis and abscess of the right forearm.  POA.   Secondary to IV drug use, admits to injecting in this region a few days to a week prior to initial erythema and pain CT of the right upper extremity as below markable for 2 areas of fluid concerning for deeper myositis and abscess as well as  more superficial area. Patient did have superficial I&D in the emergency department Orthopedics has been consulted given deeper tissue infection and questionable myositis/abscess Continue IV antibiotics with vancomycin, follow cultures  Depression with anxiety history of suicidal ideation:  Patient was just released from behavioral health.   Continue home medications once verified, patient's home med rec currently is empty  Incidentally noted hyperglycemia:  No personal history of diabetes, unclear family history A1c pending Glucose remains minimally elevated with morning labs  Elevated LFTs:  Unclear etiology, concern for polypharmacy and illicit drug/substance use ALT 253, previously 325 AST 128, previously 184 Alk phos 128, previously 162 T bili stable at 1.6 Albumin downtrending to 2.6 Repeat liver panel this morning appears somewhat improving but not yet resolved No abdominal pain, patient tolerating p.o. quite well without difficulty Tylenol level at admission undetectable Patient declines any recent alcohol, EtOH level also undetectable  DVT prophylaxis: SCD Code Status: Full Family Communication: No family at bedside Disposition Plan:  Pending clinical course, possible disposition back home Consults called: Orthopedics Admission status: Inpatient, continues to require IV antibiotics, possible surgical evaluation and intervention.  Subjective: No acute issues or events overnight, pain well controlled, tolerating p.o. well, denies chest pain, shortness of breath, nausea, vomiting, diarrhea, constipation, headache, fever, chills  Objective: Vitals:   04/27/19 2318 04/27/19 2345 04/28/19 0208 04/28/19 0342  BP:  120/69 97/61 106/67  Pulse:  (!) 123 (!) 106 99  Resp:  20 20 19   Temp:   98.3 F (36.8 C) 97.7 F (36.5 C)  TempSrc:   Oral Oral  SpO2:  96% 96% 99%  Weight: 77.1 kg     Height: 6' 1"  (1.854 m)      No intake or output data in the 24 hours ending  04/28/19 0752 Filed Weights   04/27/19 2257 04/27/19 2318  Weight: 75 kg 77.1 kg    Examination:  General exam: Appears calm and comfortable  Respiratory system: Clear to auscultation. Respiratory effort normal. Cardiovascular system: S1 & S2 heard, RRR. No JVD, murmurs, rubs, gallops or clicks. No pedal edema. Gastrointestinal system: Abdomen is nondistended, soft and nontender. No organomegaly or masses felt. Normal bowel sounds heard. Central nervous system: Alert and oriented. No focal neurological deficits. Extremities: Right medial forearm erythematous, markedly tender, somewhat firm, erythema nearly circumferential involving the distal forearm and elbow, decreased range of motion of the right elbow.  Pearline Cables to green purulent material noted from medial I&D site, scant blood. Skin: No rashes, lesions or ulcers other than above  Data Reviewed: I have personally reviewed following labs and imaging studies  CBC: Recent Labs  Lab 04/22/19 0041 04/27/19 2253 04/28/19 0411  WBC 8.7 16.0* 14.6*  NEUTROABS  --  12.0*  --   HGB 12.9* 12.4* 10.8*  HCT 41.5 39.5 34.9*  MCV 86.5 84.2 84.5  PLT 293 227 132   Basic Metabolic Panel: Recent Labs  Lab 04/22/19 0041 04/27/19 2253 04/28/19 0411  NA 136 133* 134*  K 4.1 4.1 3.9  CL 104 100 103  CO2 23 24 23   GLUCOSE 122* 206* 137*  BUN 9 11 9   CREATININE 0.86 1.03 0.97  CALCIUM 8.6* 8.5* 7.7*   GFR: Estimated Creatinine Clearance: 117 mL/min (by C-G formula based on SCr of 0.97 mg/dL). Liver Function Tests: Recent Labs  Lab 04/22/19 0041 04/27/19 2253 04/28/19 0411  AST 213* 184* 128*  ALT 348* 325* 253*  ALKPHOS 217* 162* 128*  BILITOT 0.5 1.6* 1.6*  PROT 6.6 6.5 5.5*  ALBUMIN 3.2* 3.3* 2.7*   Coagulation Profile: Recent Labs  Lab 04/27/19 2253  INR 1.1   Sepsis Labs: Recent Labs  Lab 04/27/19 2253 04/28/19 0411  LATICACIDVEN 1.6 1.1    Radiology Studies: Ct Forearm Right W Contrast  Result Date:  04/28/2019 CLINICAL DATA:  Soft tissue infection EXAM: CT OF THE UPPER RIGHT EXTREMITY WITH CONTRAST TECHNIQUE: Multidetector CT imaging of the upper right extremity was performed according to the standard protocol following intravenous contrast administration. COMPARISON:  None. CONTRAST:  14m OMNIPAQUE IOHEXOL 300 MG/ML  SOLN FINDINGS: Bones/Joint/Cartilage No fracture or malalignment. No periostitis or bone destruction. No significant elbow effusion. Ligaments Suboptimally assessed by CT. Muscles and Tendons Low-attenuation within the muscles of the proximal forearm on the volar and ulnar side. Triceps tendon grossly intact. Soft tissues Marked subcutaneous edema and fluid throughout the forearm. Rim enhancing fluid collection within the subcutaneous fat measuring 2.3 by 3.7 x 1.7 cm within the proximal forearm, volar side about 9 cm distal to the elbow joint. No soft tissue emphysema or radiopaque foreign body. IMPRESSION: 1. No acute osseous abnormality. 2. Extensive subcutaneous fluid and edema throughout the forearm consistent with cellulitis. 2.3 x 3.7 x 1.7 cm rim enhancing fluid collection within the superficial soft tissues of the proximal forearm on the volar side, about 9 cm distal to the elbow joint consistent with a soft tissue abscess. 3. Low-attenuation within the proximal forearm muscles on the  volar and ulnar side the forearm compatible with myositis and possible intramuscular abscess though no strong rim enhancement. MRI would better delineate the extent of soft tissue/muscular involvement. Electronically Signed   By: Donavan Foil M.D.   On: 04/28/2019 04:02        Scheduled Meds: . cloNIDine  0.1 mg Oral QID   Followed by  . [START ON 04/29/2019] cloNIDine  0.1 mg Oral BID   Followed by  . [START ON 05/02/2019] cloNIDine  0.1 mg Oral Daily   Continuous Infusions: . sodium chloride 100 mL/hr at 04/28/19 0350  . vancomycin 1,000 mg (04/28/19 0621)    LOS: 0 days   Time spent:  76mn  Izola Teague C Guiliana Shor, DO [facility]  If 7PM-7AM, please contact night-coverage [email or web address] Password [email or web address] 04/28/2019, 7:52 AM

## 2019-04-28 NOTE — ED Notes (Signed)
ED TO INPATIENT HANDOFF REPORT  ED Nurse Name and Phone #:  [phone]  S Name/Age/Gender Jon Castro 33 y.o. male Room/Bed: 017C/017C  Code Status   Code Status: Prior  Home/SNF/Other Home Patient oriented to: self, place, time and situation Is this baseline? Yes   Triage Complete: Triage complete  Chief Complaint Swollen Right Arm, Abcess  Triage Note Pt c/o abscess to R forearm increasing in size and fever since last night. Pt released from [facility] yesterday and was unable to get his Suboxone, did use Heroin today @ 10am.     Allergies Allergies  Allergen Reactions  . Vicodin [Hydrocodone-Acetaminophen] Itching  . Codeine Nausea And Vomiting  . Tramadol Other (See Comments)    "messes with head"    Level of Care/Admitting Diagnosis ED Disposition    ED Disposition Condition Comment   Admit  Hospital Area: [facility] [[id number]]  Level of Care: Med-Surg [16]  Covid Evaluation: Asymptomatic Screening Protocol (No Symptoms)  Diagnosis: Right forearm cellulitis [474259]  Admitting Physician: Rometta Emery [[id number]]  Attending Physician: Rometta Emery [[id number]]  Estimated length of stay: past midnight tomorrow  Certification:: I certify this patient will need inpatient services for at least 2 midnights  PT Class (Do Not Modify): Inpatient [101]  PT Acc Code (Do Not Modify): Private [1]       B Medical/Surgery History Past Medical History:  Diagnosis Date  . Back pain   . IV drug abuse (HCC)    History reviewed. No pertinent surgical history.   A IV Location/Drains/Wounds Patient Lines/Drains/Airways Status   Active Line/Drains/Airways    Name:   Placement date:   Placement time:   Site:   Days:   Peripheral IV 04/27/19 Left;Upper Forearm   04/27/19    2319    Forearm   1          Intake/Output Last 24 hours No intake or output data in the 24 hours ending 04/28/19 0220  Labs/Imaging Results for orders placed or performed  during the hospital encounter of 04/27/19 (from the past 48 hour(s))  Comprehensive metabolic panel     Status: Abnormal   Collection Time: 04/27/19 10:53 PM  Result Value Ref Range   Sodium 133 (L) 135 - 145 mmol/L   Potassium 4.1 3.5 - 5.1 mmol/L   Chloride 100 98 - 111 mmol/L   CO2 24 22 - 32 mmol/L   Glucose, Bld 206 (H) 70 - 99 mg/dL   BUN 11 6 - 20 mg/dL   Creatinine, Ser 5.63 0.61 - 1.24 mg/dL   Calcium 8.5 (L) 8.9 - 10.3 mg/dL   Total Protein 6.5 6.5 - 8.1 g/dL   Albumin 3.3 (L) 3.5 - 5.0 g/dL   AST 875 (H) 15 - 41 U/L   ALT 325 (H) 0 - 44 U/L   Alkaline Phosphatase 162 (H) 38 - 126 U/L   Total Bilirubin 1.6 (H) 0.3 - 1.2 mg/dL   GFR calc non Af Amer >60 >60 mL/min   GFR calc Af Amer >60 >60 mL/min   Anion gap 9 5 - 15    Comment: Performed at [facility], [address] [place], [place] [address]  Lactic acid, plasma     Status: None   Collection Time: 04/27/19 10:53 PM  Result Value Ref Range   Lactic Acid, Venous 1.6 0.5 - 1.9 mmol/L    Comment: Performed at [facility], [address] [place], [place] [address]  CBC with Differential     Status: Abnormal   Collection Time: 04/27/19 10:53 PM  Result Value Ref Range   WBC 16.0 (H) 4.0 - 10.5 K/uL   RBC 4.69 4.22 - 5.81 MIL/uL   Hemoglobin 12.4 (L) 13.0 - 17.0 g/dL   HCT 39.5 39.0 - 52.0 %   MCV 84.2 80.0 - 100.0 fL   MCH 26.4 26.0 - 34.0 pg   MCHC 31.4 30.0 - 36.0 g/dL   RDW 15.0 11.5 - 15.5 %   Platelets 227 150 - 400 K/uL   nRBC 0.0 0.0 - 0.2 %   Neutrophils Relative % 75 %   Neutro Abs 12.0 (H) 1.7 - 7.7 K/uL   Lymphocytes Relative 16 %   Lymphs Abs 2.5 0.7 - 4.0 K/uL   Monocytes Relative 8 %   Monocytes Absolute 1.3 (H) 0.1 - 1.0 K/uL   Eosinophils Relative 1 %   Eosinophils Absolute 0.1 0.0 - 0.5 K/uL   Basophils Relative 0 %   Basophils Absolute 0.1 0.0 - 0.1 K/uL   Immature Granulocytes 0 %   Abs Immature Granulocytes 0.06 0.00 - 0.07 K/uL    Comment: Performed at [facility], [address] [place], [place] [address]  Protime-INR     Status: None   Collection Time: 04/27/19 10:53 PM  Result Value Ref Range   Prothrombin Time 14.2 11.4 - 15.2 seconds   INR 1.1 0.8 - 1.2    Comment: (NOTE) INR goal varies based on device and disease states. Performed at [facility], [place] [address] [place], [place] [address]    No results found.  Pending Labs Unresulted Labs (From admission, onward)    Start     Ordered   04/27/19 2243  Lactic acid, plasma  Now then every 2 hours,   STAT     04/27/19 2243   04/27/19 2243  Culture, blood (Routine x 2)  BLOOD CULTURE X 2,   STAT     04/27/19 2243   Signed and Held  Comprehensive metabolic panel  Tomorrow morning,   R     Signed and Held   Signed and Held  CBC  Tomorrow morning,   R     Signed and Held          Vitals/Pain Today's Vitals   04/27/19 2318 04/27/19 2345 04/27/19 2354 04/28/19 0208  BP:  120/69  97/61  Pulse:  (!) 123  (!) 106  Resp:  20  20  Temp:    98.3 F (36.8 C)  TempSrc:    Oral  SpO2:  96%  96%  Weight: 77.1 kg     Height: 6\' 1"  (1.854 m)     PainSc:   8      Isolation Precautions No active isolations  Medications Medications  cloNIDine (CATAPRES) tablet 0.1 mg (0.1 mg Oral Given 04/27/19 2322)    Followed by  cloNIDine (CATAPRES) tablet 0.1 mg (has no administration in time range)    Followed by  cloNIDine (CATAPRES) tablet 0.1 mg (has no administration in time range)  oxyCODONE-acetaminophen (PERCOCET/ROXICET) 5-325 MG per tablet 1 tablet (has no administration in time range)  vancomycin (VANCOCIN) IVPB 1000 mg/200 mL premix (has no administration in time range)  sodium chloride 0.9 % bolus 2,250 mL (2,250 mLs Intravenous New Bag/Given 04/27/19 2328)  acetaminophen (TYLENOL) tablet 1,000 mg (1,000 mg Oral Given 04/27/19 2322)  vancomycin (VANCOCIN) IVPB 1000 mg/200 mL premix (0 mg Intravenous  Stopped 04/28/19 0154)  piperacillin-tazobactam (ZOSYN) IVPB 3.375 g (0 g  Intravenous Stopped 04/28/19 0017)  lidocaine-EPINEPHrine (XYLOCAINE W/EPI) 2 %-1:200000 (PF) injection 10 mL (10 mLs Infiltration Given 04/27/19 2354)    Mobility walks     Focused Assessments RUE   R Recommendations: See Admitting Provider Note  Report given to:   Additional Notes: -

## 2019-04-28 NOTE — H&P (Signed)
History and Physical   Jon MadeiraKevin Sieg ZOX:[id number] DOB: 1986-05-04 DOA: 04/27/2019  Referring MD/NP/PA: Dr. Wilkie AyeHorton  PCP: Patient, [name] Per   Outpatient Specialists: None  Patient coming from: Home  Chief Complaint: Right arm pain  HPI: Jon MadeiraKevin Castro is a 33 y.o. male with medical history significant of IV drug abuse, chronic pain syndrome, recent suicide attempt with transfer to behavioral health who is on Suboxone and apparently unable to get the clinic and get his Suboxone so he used heroin today.  Since he was released from behavioral health he has noticed increase in size of a lesion on his right forearm with fever.  Now the whole hand is swollen tender painful.  Patient was seen in the ER suspected abscess.  Initial I&D done superficially and area dressed.  He is being admitted with cellulitis.  Patient's pain is 9 out of 10 at the moment.  He attempted to shoot the heroin in the area.  No contact with anybody was covered.  No previous bacteremia or sepsis denied any MRSA infections.  Patient is being admitted for IV antibiotic and supportive care.  ED Course: Temperature 103 blood pressure 97/61 pulse 153 respiratory rate of 20 oxygen sat 95% room air.  White count 16,000 hemoglobin 12.4 and platelet 227.  Sodium 133 potassium 4.1 chloride 100 CO2 24 with BUN 11 and creatinine 1.03.  Calcium 8.5 and glucose 206 INR 1.1.  Patient given IV Zosyn with initial incision and drainage and is being admitted to the hospital for treatment  Review of Systems: As per HPI otherwise 10 point review of systems negative.    Past Medical History:  Diagnosis Date  . Back pain   . IV drug abuse (HCC)     History reviewed. No pertinent surgical history.   reports that he has been smoking. He has never used smokeless tobacco. He reports current drug use. Drugs: Marijuana and Heroin. He reports that he does not drink alcohol.  Allergies  Allergen Reactions  . Vicodin [Hydrocodone-Acetaminophen]  Itching  . Codeine Nausea And Vomiting  . Tramadol Other (See Comments)    "messes with head"    No family history on file.   Prior to Admission medications   Medication Sig Start Date End Date Taking? Authorizing Provider  cloNIDine (CATAPRES) 0.1 MG tablet Take 1 tablet (0.1 mg total) by mouth 3 (three) times daily for 5 days. Patient not taking: Reported on 03/24/2019 03/08/19 03/24/19  Virgina Norfolkuratolo, Adam, DO  traZODone (DESYREL) 50 MG tablet Take 1 tablet (50 mg total) by mouth at bedtime as needed for sleep. Patient not taking: Reported on 02/21/2019 02/11/19 03/24/19  Aldean BakerSykes, Janet E, NP    Physical Exam: Vitals:   04/27/19 2241 04/27/19 2257 04/27/19 2318 04/27/19 2345  BP: 108/67   120/69  Pulse: (!) 153   (!) 123  Resp: 18   20  Temp: (!) 103 F (39.4 C)     TempSrc: Oral     SpO2: 95%   96%  Weight:  75 kg 77.1 kg   Height:  6\' 1"  (1.854 m) 6\' 1"  (1.854 m)       Constitutional: NAD, appears anxious and in mild distress due to pain Vitals:   04/27/19 2241 04/27/19 2257 04/27/19 2318 04/27/19 2345  BP: 108/67   120/69  Pulse: (!) 153   (!) 123  Resp: 18   20  Temp: (!) 103 F (39.4 C)     TempSrc: Oral     SpO2:  95%   96%  Weight:  75 kg 77.1 kg   Height:  6\' 1"  (1.854 m) 6\' 1"  (1.854 m)    Eyes: PERRL, lids and conjunctivae normal ENMT: Mucous membranes are moist. Posterior pharynx clear of any exudate or lesions.Normal dentition.  Neck: normal, supple, no masses, no thyromegaly Respiratory: clear to auscultation bilaterally, no wheezing, no crackles. Normal respiratory effort. No accessory muscle use.  Cardiovascular: Regular rate and rhythm, no murmurs / rubs / gallops. No extremity edema. 2+ pedal pulses. No carotid bruits.  Abdomen: no tenderness, no masses palpated. No hepatosplenomegaly. Bowel sounds positive.  Musculoskeletal: Right forearm swollen, tender, lower arm and elbow are also involved, decreased range of motion of the right elbow.  Warm to touch with  an area in the medial part that is oozing pus. Skin: no rashes, lesions, ulcers. No induration Neurologic: CN 2-12 grossly intact. Sensation intact, DTR normal. Strength 5/5 in all 4.  Psychiatric: Normal judgment and insight. Alert and oriented x 3. Normal mood.     Labs on Admission: I have personally reviewed following labs and imaging studies  CBC: Recent Labs  Lab 04/22/19 0041 04/27/19 2253  WBC 8.7 16.0*  NEUTROABS  --  12.0*  HGB 12.9* 12.4*  HCT 41.5 39.5  MCV 86.5 84.2  PLT 293 227   Basic Metabolic Panel: Recent Labs  Lab 04/22/19 0041 04/27/19 2253  NA 136 133*  K 4.1 4.1  CL 104 100  CO2 23 24  GLUCOSE 122* 206*  BUN 9 11  CREATININE 0.86 1.03  CALCIUM 8.6* 8.5*   GFR: Estimated Creatinine Clearance: 110.2 mL/min (by C-G formula based on SCr of 1.03 mg/dL). Liver Function Tests: Recent Labs  Lab 04/22/19 0041 04/27/19 2253  AST 213* 184*  ALT 348* 325*  ALKPHOS 217* 162*  BILITOT 0.5 1.6*  PROT 6.6 6.5  ALBUMIN 3.2* 3.3*   No results for input(s): LIPASE, AMYLASE in the last 168 hours. No results for input(s): AMMONIA in the last 168 hours. Coagulation Profile: Recent Labs  Lab 04/27/19 2253  INR 1.1   Cardiac Enzymes: No results for input(s): CKTOTAL, CKMB, CKMBINDEX, TROPONINI in the last 168 hours. BNP (last 3 results) No results for input(s): PROBNP in the last 8760 hours. HbA1C: No results for input(s): HGBA1C in the last 72 hours. CBG: No results for input(s): GLUCAP in the last 168 hours. Lipid Profile: No results for input(s): CHOL, HDL, LDLCALC, TRIG, CHOLHDL, LDLDIRECT in the last 72 hours. Thyroid Function Tests: No results for input(s): TSH, T4TOTAL, FREET4, T3FREE, THYROIDAB in the last 72 hours. Anemia Panel: No results for input(s): VITAMINB12, FOLATE, FERRITIN, TIBC, IRON, RETICCTPCT in the last 72 hours. Urine analysis:    Component Value Date/Time   COLORURINE YELLOW 02/09/2011 2210   APPEARANCEUR CLOUDY (A)  02/09/2011 2210   LABSPEC 1.025 02/09/2011 2210   PHURINE 7.0 02/09/2011 2210   GLUCOSEU NEGATIVE 02/09/2011 2210   HGBUR NEGATIVE 02/09/2011 2210   BILIRUBINUR NEGATIVE 02/09/2011 2210   KETONESUR NEGATIVE 02/09/2011 2210   PROTEINUR NEGATIVE 02/09/2011 2210   UROBILINOGEN 1.0 02/09/2011 2210   NITRITE NEGATIVE 02/09/2011 2210   LEUKOCYTESUR NEGATIVE 02/09/2011 2210   Sepsis Labs: @LABRCNTIP (procalcitonin:4,lacticidven:4) )No results found for this or any previous visit (from the past 240 hour(s)).   Radiological Exams on Admission: No results found.  EKG: Independently reviewed.  Sinus tachycardia with a rate of 140s.  No significant ST changes  Assessment/Plan Principal Problem:   Right forearm cellulitis Active Problems:  MDD (major depressive disorder), severe ([place])   Opioid dependence with opioid-induced mood disorder (HCC)   Hyperglycemia   Leucocytosis   LFTs abnormal     #1 right forearm cellulitis and abscess: Secondary to IV drug use.  Patient will be admitted to the hospital for IV antibiotics.  Will get CT of the forearm to see the extent of his cellulitis.  If deep abscess or pressure or any surgical indication based on the CT we will consult orthopedics.  #2 depression with anxiety: Patient was just released from behavioral health.  Of 10 medications recommended and continue.  #3 leukocytosis: Secondary to cellulitis.  Continue to monitor  #4 hyperglycemia: Not a nondiabetic.  Check hemoglobin A1c.  #6 abnormal LFTs: Probably drug-related.  Recheck and monitor   DVT prophylaxis: SCD Code Status: Full Family Communication: No family at bedside Disposition Plan: To be determined Consults called: None at the moment Admission status: Inpatient  Severity of Illness: The appropriate patient status for this patient is INPATIENT. Inpatient status is judged to be reasonable and necessary in order to provide the required intensity of service to ensure the  patient's safety. The patient's presenting symptoms, physical exam findings, and initial radiographic and laboratory data in the context of their chronic comorbidities is felt to place them at high risk for further clinical deterioration. Furthermore, it is not anticipated that the patient will be medically stable for discharge from the hospital within 2 midnights of admission. The following factors support the patient status of inpatient.   " The patient's presenting symptoms include right forearm swelling and pain. " The worrisome physical exam findings include swollen right forearm tender and warm. " The initial radiographic and laboratory data are worrisome because of leukocytosis. " The chronic co-morbidities include IV drug abuse.   * I certify that at the point of admission it is my clinical judgment that the patient will require inpatient hospital care spanning beyond 2 midnights from the point of admission due to high intensity of service, high risk for further deterioration and high frequency of surveillance required.Barbette Merino MD [facility] Pager [phone]  If 7PM-7AM, please contact night-coverage [email or web address] Password [email or web address]  04/28/2019, 1:31 AM

## 2019-04-28 NOTE — Consult Note (Signed)
ORTHOPAEDIC CONSULTATION  REQUESTING PHYSICIAN: Little Ishikawa, MD  Chief Complaint: R arm abscess  HPI: I reviewed and agree with the below history:   Jon Castro is a 33 y.o. male with medical history significant of IV drug abuse, chronic pain syndrome, recent suicide attempt with transfer to behavioral health who is on Suboxone and apparently unable to get the clinic and get his Suboxone so he used heroin today.  Since he was released from behavioral health he has noticed increase in size of a lesion on his right forearm with fever.  Now the whole hand is swollen tender painful.  Patient was seen in the ER suspected abscess.  Initial I&D done superficially and area dressed.  He is being admitted with cellulitis.  Patient's pain is 9 out of 10 at the moment.  He attempted to shoot the heroin in the area.  No contact with anybody was covered.  No previous bacteremia or sepsis denied any MRSA infections.  Patient is being admitted for IV antibiotic and supportive care.   Past Medical History:  Diagnosis Date  . Back pain   . IV drug abuse ([place])    History reviewed. No pertinent surgical history. Social History   Socioeconomic History  . Marital status: Single    Spouse name: Not on file  . Number of children: Not on file  . Years of education: Not on file  . Highest education level: Not on file  Occupational History  . Not on file  Social Needs  . Financial resource strain: Not on file  . Food insecurity    Worry: Not on file    Inability: Not on file  . Transportation needs    Medical: Not on file    Non-medical: Not on file  Tobacco Use  . Smoking status: Current Some Day Smoker  . Smokeless tobacco: Never Used  Substance and Sexual Activity  . Alcohol use: No  . Drug use: Yes    Types: Marijuana, Heroin  . Sexual activity: Not on file  Lifestyle  . Physical activity    Days per week: Not on file    Minutes per session: Not on file  . Stress: Not on  file  Relationships  . Social Herbalist on phone: Not on file    Gets together: Not on file    Attends religious service: Not on file    Active member of club or organization: Not on file    Attends meetings of clubs or organizations: Not on file    Relationship status: Not on file  Other Topics Concern  . Not on file  Social History Narrative  . Not on file   No family history on file. Allergies  Allergen Reactions  . Vicodin [Hydrocodone-Acetaminophen] Itching  . Codeine Nausea And Vomiting  . Tramadol Other (See Comments)    "messes with head"   Prior to Admission medications   Medication Sig Start Date End Date Taking? Authorizing Provider  cloNIDine (CATAPRES) 0.1 MG tablet Take 1 tablet (0.1 mg total) by mouth 3 (three) times daily for 5 days. Patient not taking: Reported on 03/24/2019 03/08/19 03/24/19  Lennice Sites, DO  traZODone (DESYREL) 50 MG tablet Take 1 tablet (50 mg total) by mouth at bedtime as needed for sleep. Patient not taking: Reported on 02/21/2019 02/11/19 03/24/19  Connye Burkitt, NP   Ct Forearm Right W Contrast  Result Date: 04/28/2019 CLINICAL DATA:  Soft tissue infection EXAM:  CT OF THE UPPER RIGHT EXTREMITY WITH CONTRAST TECHNIQUE: Multidetector CT imaging of the upper right extremity was performed according to the standard protocol following intravenous contrast administration. COMPARISON:  None. CONTRAST:  161m OMNIPAQUE IOHEXOL 300 MG/ML  SOLN FINDINGS: Bones/Joint/Cartilage No fracture or malalignment. No periostitis or bone destruction. No significant elbow effusion. Ligaments Suboptimally assessed by CT. Muscles and Tendons Low-attenuation within the muscles of the proximal forearm on the volar and ulnar side. Triceps tendon grossly intact. Soft tissues Marked subcutaneous edema and fluid throughout the forearm. Rim enhancing fluid collection within the subcutaneous fat measuring 2.3 by 3.7 x 1.7 cm within the proximal forearm, volar side  about 9 cm distal to the elbow joint. No soft tissue emphysema or radiopaque foreign body. IMPRESSION: 1. No acute osseous abnormality. 2. Extensive subcutaneous fluid and edema throughout the forearm consistent with cellulitis. 2.3 x 3.7 x 1.7 cm rim enhancing fluid collection within the superficial soft tissues of the proximal forearm on the volar side, about 9 cm distal to the elbow joint consistent with a soft tissue abscess. 3. Low-attenuation within the proximal forearm muscles on the volar and ulnar side the forearm compatible with myositis and possible intramuscular abscess though no strong rim enhancement. MRI would better delineate the extent of soft tissue/muscular involvement. Electronically Signed   By: KDonavan FoilM.D.   On: 04/28/2019 04:02    Positive ROS: All other systems have been reviewed and were otherwise negative with the exception of those mentioned in the HPI and as above.  Labs cbc Recent Labs    04/27/19 2253 04/28/19 0411  WBC 16.0* 14.6*  HGB 12.4* 10.8*  HCT 39.5 34.9*  PLT 227 152    Labs inflam No results for input(s): CRP in the last 72 hours.  Invalid input(s): ESR  Labs coag Recent Labs    04/27/19 2253  INR 1.1    Recent Labs    04/27/19 2253 04/28/19 0411  NA 133* 134*  K 4.1 3.9  CL 100 103  CO2 24 23  GLUCOSE 206* 137*  BUN 11 9  CREATININE 1.03 0.97  CALCIUM 8.5* 7.7*    Physical Exam: Vitals:   04/28/19 1430 04/28/19 1642  BP: 107/67   Pulse: 91   Resp: 20   Temp: (!) 102.3 F (39.1 C) 98.6 F (37 C)  SpO2: 97%    General: Alert, no acute distress.  Upright in bed.  Calm, conversant. Cardiovascular: No pedal edema Respiratory: No cyanosis, no use of accessory musculature GI: No organomegaly, abdomen is soft and non-tender Skin: No lesions in the area of chief complaint other than those listed below in MSK exam.  Neurologic: Sensation intact distally save for the below mentioned MSK exam Psychiatric: Patient is  competent for consent with normal mood and affect Lymphatic: No axillary or cervical lymphadenopathy  MUSCULOSKELETAL:  RUE: Right elbow forearm and hand swollen.  Erythema present at the elbow and forearm.  There is some boggy fluctuance over his olecranon bursa.  Minimal pain with range of motion at the elbow wrist and hand.  No pain at the shoulder.  Gauze dressing over previous I&D site right forearm- Purulent drainage.  Antecubital space with some chronic scarred areas.  NVI distally, compartments soft.   Other extremities are atraumatic with painless ROM and NVI.  Assessment: R arm abscess  Plan: OR today for I&D of this abscess and exploration.    HPrudencio BurlyIII, PA-C 04/28/2019 5:12 PM   TRenette Butters  MD Cell [phone]   04/28/2019 4:47 PM

## 2019-04-28 NOTE — OR Nursing (Signed)
Patient came to OR with shorts on, pockets contain cigarettes, light and wallet. Wallet contained 12$ (2-5's,2-1's) and $0.80 cents. Money was placed in pink container with pt label and amount. All items were placed in specimen bag, labeled and attached to pt chart. Chart and belongings taken with patient to PACU.

## 2019-04-28 NOTE — H&P (View-Only) (Signed)
ORTHOPAEDIC CONSULTATION  REQUESTING PHYSICIAN: Little Ishikawa, MD  Chief Complaint: R arm abscess  HPI: I reviewed and agree with the below history:   Jon Castro is a 33 y.o. male with medical history significant of IV drug abuse, chronic pain syndrome, recent suicide attempt with transfer to behavioral health who is on Suboxone and apparently unable to get the clinic and get his Suboxone so he used heroin today.  Since he was released from behavioral health he has noticed increase in size of a lesion on his right forearm with fever.  Now the whole hand is swollen tender painful.  Patient was seen in the ER suspected abscess.  Initial I&D done superficially and area dressed.  He is being admitted with cellulitis.  Patient's pain is 9 out of 10 at the moment.  He attempted to shoot the heroin in the area.  No contact with anybody was covered.  No previous bacteremia or sepsis denied any MRSA infections.  Patient is being admitted for IV antibiotic and supportive care.   Past Medical History:  Diagnosis Date  . Back pain   . IV drug abuse ([place])    History reviewed. No pertinent surgical history. Social History   Socioeconomic History  . Marital status: Single    Spouse name: Not on file  . Number of children: Not on file  . Years of education: Not on file  . Highest education level: Not on file  Occupational History  . Not on file  Social Needs  . Financial resource strain: Not on file  . Food insecurity    Worry: Not on file    Inability: Not on file  . Transportation needs    Medical: Not on file    Non-medical: Not on file  Tobacco Use  . Smoking status: Current Some Day Smoker  . Smokeless tobacco: Never Used  Substance and Sexual Activity  . Alcohol use: No  . Drug use: Yes    Types: Marijuana, Heroin  . Sexual activity: Not on file  Lifestyle  . Physical activity    Days per week: Not on file    Minutes per session: Not on file  . Stress: Not on  file  Relationships  . Social Herbalist on phone: Not on file    Gets together: Not on file    Attends religious service: Not on file    Active member of club or organization: Not on file    Attends meetings of clubs or organizations: Not on file    Relationship status: Not on file  Other Topics Concern  . Not on file  Social History Narrative  . Not on file   No family history on file. Allergies  Allergen Reactions  . Vicodin [Hydrocodone-Acetaminophen] Itching  . Codeine Nausea And Vomiting  . Tramadol Other (See Comments)    "messes with head"   Prior to Admission medications   Medication Sig Start Date End Date Taking? Authorizing Provider  cloNIDine (CATAPRES) 0.1 MG tablet Take 1 tablet (0.1 mg total) by mouth 3 (three) times daily for 5 days. Patient not taking: Reported on 03/24/2019 03/08/19 03/24/19  Lennice Sites, DO  traZODone (DESYREL) 50 MG tablet Take 1 tablet (50 mg total) by mouth at bedtime as needed for sleep. Patient not taking: Reported on 02/21/2019 02/11/19 03/24/19  Connye Burkitt, NP   Ct Forearm Right W Contrast  Result Date: 04/28/2019 CLINICAL DATA:  Soft tissue infection EXAM:  CT OF THE UPPER RIGHT EXTREMITY WITH CONTRAST TECHNIQUE: Multidetector CT imaging of the upper right extremity was performed according to the standard protocol following intravenous contrast administration. COMPARISON:  None. CONTRAST:  165m OMNIPAQUE IOHEXOL 300 MG/ML  SOLN FINDINGS: Bones/Joint/Cartilage No fracture or malalignment. No periostitis or bone destruction. No significant elbow effusion. Ligaments Suboptimally assessed by CT. Muscles and Tendons Low-attenuation within the muscles of the proximal forearm on the volar and ulnar side. Triceps tendon grossly intact. Soft tissues Marked subcutaneous edema and fluid throughout the forearm. Rim enhancing fluid collection within the subcutaneous fat measuring 2.3 by 3.7 x 1.7 cm within the proximal forearm, volar side  about 9 cm distal to the elbow joint. No soft tissue emphysema or radiopaque foreign body. IMPRESSION: 1. No acute osseous abnormality. 2. Extensive subcutaneous fluid and edema throughout the forearm consistent with cellulitis. 2.3 x 3.7 x 1.7 cm rim enhancing fluid collection within the superficial soft tissues of the proximal forearm on the volar side, about 9 cm distal to the elbow joint consistent with a soft tissue abscess. 3. Low-attenuation within the proximal forearm muscles on the volar and ulnar side the forearm compatible with myositis and possible intramuscular abscess though no strong rim enhancement. MRI would better delineate the extent of soft tissue/muscular involvement. Electronically Signed   By: KDonavan FoilM.D.   On: 04/28/2019 04:02    Positive ROS: All other systems have been reviewed and were otherwise negative with the exception of those mentioned in the HPI and as above.  Labs cbc Recent Labs    04/27/19 2253 04/28/19 0411  WBC 16.0* 14.6*  HGB 12.4* 10.8*  HCT 39.5 34.9*  PLT 227 152    Labs inflam No results for input(s): CRP in the last 72 hours.  Invalid input(s): ESR  Labs coag Recent Labs    04/27/19 2253  INR 1.1    Recent Labs    04/27/19 2253 04/28/19 0411  NA 133* 134*  K 4.1 3.9  CL 100 103  CO2 24 23  GLUCOSE 206* 137*  BUN 11 9  CREATININE 1.03 0.97  CALCIUM 8.5* 7.7*    Physical Exam: Vitals:   04/28/19 1430 04/28/19 1642  BP: 107/67   Pulse: 91   Resp: 20   Temp: (!) 102.3 F (39.1 C) 98.6 F (37 C)  SpO2: 97%    General: Alert, no acute distress.  Upright in bed.  Calm, conversant. Cardiovascular: No pedal edema Respiratory: No cyanosis, no use of accessory musculature GI: No organomegaly, abdomen is soft and non-tender Skin: No lesions in the area of chief complaint other than those listed below in MSK exam.  Neurologic: Sensation intact distally save for the below mentioned MSK exam Psychiatric: Patient is  competent for consent with normal mood and affect Lymphatic: No axillary or cervical lymphadenopathy  MUSCULOSKELETAL:  RUE: Right elbow forearm and hand swollen.  Erythema present at the elbow and forearm.  There is some boggy fluctuance over his olecranon bursa.  Minimal pain with range of motion at the elbow wrist and hand.  No pain at the shoulder.  Gauze dressing over previous I&D site right forearm- Purulent drainage.  Antecubital space with some chronic scarred areas.  NVI distally, compartments soft.   Other extremities are atraumatic with painless ROM and NVI.  Assessment: R arm abscess  Plan: OR today for I&D of this abscess and exploration.    HPrudencio BurlyIII, PA-C 04/28/2019 5:12 PM   TRenette Butters  MD Cell [phone]   04/28/2019 4:47 PM

## 2019-04-28 NOTE — Progress Notes (Signed)
PHARMACY - PHYSICIAN COMMUNICATION CRITICAL VALUE ALERT - BLOOD CULTURE IDENTIFICATION (BCID)  Jon Castro is an 33 y.o. male who presented to [facility] on 04/27/2019 with a chief complaint of IV drug abuse / arm abscess  Assessment:  With MRSA bacteremia  Name of physician (or Provider) Contacted: [facility]  Current antibiotics: Vancomycin / Zosyn  Changes to prescribed antibiotics recommended:  No change in antibiotics needed Will be an automatic ID consult ? DC Zosyn  Results for orders placed or performed during the hospital encounter of 04/27/19  Blood Culture ID Panel (Reflexed) (Collected: 04/27/2019 10:45 PM)  Result Value Ref Range   Enterococcus species NOT DETECTED NOT DETECTED   Listeria monocytogenes NOT DETECTED NOT DETECTED   Staphylococcus species DETECTED (A) NOT DETECTED   Staphylococcus aureus (BCID) DETECTED (A) NOT DETECTED   Methicillin resistance DETECTED (A) NOT DETECTED   Streptococcus species NOT DETECTED NOT DETECTED   Streptococcus agalactiae NOT DETECTED NOT DETECTED   Streptococcus pneumoniae NOT DETECTED NOT DETECTED   Streptococcus pyogenes NOT DETECTED NOT DETECTED   Acinetobacter baumannii NOT DETECTED NOT DETECTED   Enterobacteriaceae species NOT DETECTED NOT DETECTED   Enterobacter cloacae complex NOT DETECTED NOT DETECTED   Escherichia coli NOT DETECTED NOT DETECTED   Klebsiella oxytoca NOT DETECTED NOT DETECTED   Klebsiella pneumoniae NOT DETECTED NOT DETECTED   Proteus species NOT DETECTED NOT DETECTED   Serratia marcescens NOT DETECTED NOT DETECTED   Haemophilus influenzae NOT DETECTED NOT DETECTED   Neisseria meningitidis NOT DETECTED NOT DETECTED   Pseudomonas aeruginosa NOT DETECTED NOT DETECTED   Candida albicans NOT DETECTED NOT DETECTED   Candida glabrata NOT DETECTED NOT DETECTED   Candida krusei NOT DETECTED NOT DETECTED   Candida parapsilosis NOT DETECTED NOT DETECTED   Candida tropicalis NOT DETECTED NOT DETECTED     Tad Moore 04/28/2019  7:42 PM

## 2019-04-28 NOTE — Transfer of Care (Signed)
Immediate Anesthesia Transfer of Care Note  Patient: Jon Castro  Procedure(s) Performed: IRRIGATION AND DEBRIDEMENT ARM ABSCESS (Right Arm Lower)  Patient Location: PACU  Anesthesia Type:General  Level of Consciousness: awake, alert  and oriented  Airway & Oxygen Therapy: Patient Spontanous Breathing and Patient connected to nasal cannula oxygen  Post-op Assessment: Report given to RN, Post -op Vital signs reviewed and stable and Patient moving all extremities X 4  Post vital signs: Reviewed and stable  Last Vitals:  Vitals Value Taken Time  BP 131/81 04/28/19 2048  Temp 36.4 C 04/28/19 2045  Pulse 80 04/28/19 2049  Resp 14 04/28/19 2049  SpO2 100 % 04/28/19 2049  Vitals shown include unvalidated device data.  Last Pain:  Vitals:   04/28/19 1642  TempSrc: Oral  PainSc:       Patients Stated Pain Goal: 0 (46/96/29 5284)  Complications: No apparent anesthesia complications

## 2019-04-28 NOTE — ED Notes (Signed)
Patient transported to CT 

## 2019-04-28 NOTE — Discharge Instructions (Signed)
°  Right Arm: Elevate arm at all times. Do not bear weight with Right arm. Keep dressings on and dry until follow up. Follow up in one week.

## 2019-04-29 ENCOUNTER — Inpatient Hospital Stay (HOSPITAL_COMMUNITY): Payer: Self-pay

## 2019-04-29 ENCOUNTER — Other Ambulatory Visit: Payer: Self-pay

## 2019-04-29 ENCOUNTER — Encounter (HOSPITAL_COMMUNITY): Payer: Self-pay | Admitting: Orthopedic Surgery

## 2019-04-29 DIAGNOSIS — F332 Major depressive disorder, recurrent severe without psychotic features: Secondary | ICD-10-CM

## 2019-04-29 DIAGNOSIS — L02413 Cutaneous abscess of right upper limb: Secondary | ICD-10-CM

## 2019-04-29 DIAGNOSIS — R7881 Bacteremia: Secondary | ICD-10-CM

## 2019-04-29 DIAGNOSIS — B9562 Methicillin resistant Staphylococcus aureus infection as the cause of diseases classified elsewhere: Secondary | ICD-10-CM

## 2019-04-29 DIAGNOSIS — Z885 Allergy status to narcotic agent status: Secondary | ICD-10-CM

## 2019-04-29 DIAGNOSIS — F1910 Other psychoactive substance abuse, uncomplicated: Secondary | ICD-10-CM

## 2019-04-29 DIAGNOSIS — F1990 Other psychoactive substance use, unspecified, uncomplicated: Secondary | ICD-10-CM | POA: Diagnosis present

## 2019-04-29 HISTORY — DX: Methicillin resistant Staphylococcus aureus infection as the cause of diseases classified elsewhere: B95.62

## 2019-04-29 HISTORY — DX: Bacteremia: R78.81

## 2019-04-29 LAB — COMPREHENSIVE METABOLIC PANEL
ALT: 210 U/L — ABNORMAL HIGH (ref 0–44)
AST: 103 U/L — ABNORMAL HIGH (ref 15–41)
Albumin: 2.5 g/dL — ABNORMAL LOW (ref 3.5–5.0)
Alkaline Phosphatase: 130 U/L — ABNORMAL HIGH (ref 38–126)
Anion gap: 10 (ref 5–15)
BUN: 8 mg/dL (ref 6–20)
CO2: 19 mmol/L — ABNORMAL LOW (ref 22–32)
Calcium: 8.3 mg/dL — ABNORMAL LOW (ref 8.9–10.3)
Chloride: 108 mmol/L (ref 98–111)
Creatinine, Ser: 0.79 mg/dL (ref 0.61–1.24)
GFR calc Af Amer: >60 mL/min (ref >60)
GFR calc non Af Amer: >60 mL/min (ref >60)
Glucose, Bld: 261 mg/dL — ABNORMAL HIGH (ref 70–99)
Potassium: 4.2 mmol/L (ref 3.5–5.1)
Sodium: 137 mmol/L (ref 135–145)
Total Bilirubin: 0.8 mg/dL (ref 0.3–1.2)
Total Protein: 5.7 g/dL — ABNORMAL LOW (ref 6.5–8.1)

## 2019-04-29 LAB — CBC
HCT: 35.4 % — ABNORMAL LOW (ref 39.0–52.0)
Hemoglobin: 11.2 g/dL — ABNORMAL LOW (ref 13.0–17.0)
MCH: 26.8 pg (ref 26.0–34.0)
MCHC: 31.6 g/dL (ref 30.0–36.0)
MCV: 84.7 fL (ref 80.0–100.0)
Platelets: 71 10*3/uL — ABNORMAL LOW (ref 150–400)
RBC: 4.18 MIL/uL — ABNORMAL LOW (ref 4.22–5.81)
RDW: 15.3 % (ref 11.5–15.5)
WBC: 9.5 10*3/uL (ref 4.0–10.5)
nRBC: 0 % (ref 0.0–0.2)

## 2019-04-29 LAB — HEMOGLOBIN A1C
Hgb A1c MFr Bld: 5.8 % — ABNORMAL HIGH (ref 4.8–5.6)
Mean Plasma Glucose: 119.76 mg/dL

## 2019-04-29 LAB — ECHOCARDIOGRAM COMPLETE
Height: 73 in
Weight: 2720 oz

## 2019-04-29 LAB — GLUCOSE, CAPILLARY: Glucose-Capillary: 188 mg/dL — ABNORMAL HIGH (ref 70–99)

## 2019-04-29 MED ORDER — INSULIN ASPART 100 UNIT/ML ~~LOC~~ SOLN: 0.0000 [IU] | Freq: Three times a day (TID) | SUBCUTANEOUS | Status: DC

## 2019-04-29 NOTE — Evaluation (Signed)
Occupational Therapy Evaluation and Discharge Patient Details Name: Jon MadeiraKevin Castro MRN: [id number] DOB: 08/10/1986 Today's Date: 04/29/2019    History of Present Illness s/p I&D R forearm, +MRSA PMH: MDD with recent suicide attempt, IV drug use, chronic pain.   Clinical Impression   Educated pt in edema management of R UE. Pt verbalized understanding. Placed 2 pillows and sponge pads under R UE and iced. Pt is freely moving fingers and shoulder frequently. He is functioning modified independently in self care.     Follow Up Recommendations  No OT follow up    Equipment Recommendations  None recommended by OT    Recommendations for Other Services       Precautions / Restrictions Restrictions Weight Bearing Restrictions: Yes RUE Weight Bearing: Non weight bearing      Mobility Bed Mobility Overal bed mobility: Modified Independent                Transfers Overall transfer level: Independent                    Balance                                           ADL either performed or assessed with clinical judgement   ADL Overall ADL's : Modified independent                                             Vision Baseline Vision/History: No visual deficits       Perception     Praxis      Pertinent Vitals/Pain Pain Assessment: Faces Faces Pain Scale: Hurts a little bit Pain Location: R UE Pain Descriptors / Indicators: Throbbing Pain Intervention(s): Monitored during session;Repositioned;Premedicated before session     Hand Dominance Right   Extremity/Trunk Assessment Upper Extremity Assessment Upper Extremity Assessment: RUE deficits/detail RUE Deficits / Details: educated in elevation, icing and AROM R UE to manage edema   Lower Extremity Assessment Lower Extremity Assessment: Overall WFL for tasks assessed   Cervical / Trunk Assessment Cervical / Trunk Assessment: Normal   Communication  Communication Communication: No difficulties   Cognition Arousal/Alertness: Awake/alert Behavior During Therapy: WFL for tasks assessed/performed Overall Cognitive Status: Within Functional Limits for tasks assessed                                     General Comments       Exercises     Shoulder Instructions      Home Living                                   Additional Comments: pt reports he lives alone      Prior Functioning/Environment Level of Independence: Independent                 OT Problem List:        OT Treatment/Interventions:      OT Goals(Current goals can be found in the care plan section)    OT Frequency:     Barriers to D/C:  Co-evaluation              AM-PAC OT "6 Clicks" Daily Activity     Outcome Measure Help from another person eating meals?: None Help from another person taking care of personal grooming?: None Help from another person toileting, which includes using toliet, bedpan, or urinal?: None Help from another person bathing (including washing, rinsing, drying)?: None Help from another person to put on and taking off regular upper body clothing?: None Help from another person to put on and taking off regular lower body clothing?: None 6 Click Score: 24   End of Session    Activity Tolerance: Patient tolerated treatment well Patient left: in bed;with call bell/phone within reach  OT Visit Diagnosis: Pain                Time: 8299-3716 OT Time Calculation (min): 23 min Charges:  OT General Charges $OT Visit: 1 Visit OT Evaluation $OT Eval Low Complexity: 1 Low OT Treatments $Therapeutic Activity: 8-22 mins  Nestor Lewandowsky, OTR/L Acute Rehabilitation Services Pager: [phone] Office: [phone] Malka So 04/29/2019, 9:22 AM

## 2019-04-29 NOTE — Plan of Care (Signed)
  Problem: Education: Goal: Knowledge of General Education information will improve Description Including pain rating scale, medication(s)/side effects and non-pharmacologic comfort measures Outcome: Progressing   

## 2019-04-29 NOTE — Op Note (Signed)
04/28/2019  7:58 AM  PATIENT:  Jon Castro    PRE-OPERATIVE DIAGNOSIS:  ABSCESS  POST-OPERATIVE DIAGNOSIS:  Same  PROCEDURE:  IRRIGATION AND DEBRIDEMENT ARM ABSCESS  SURGEON:  Renette Butters, MD  ASSISTANT: Roxan Hockey, PA-C, he was present and scrubbed throughout the case, critical for completion in a timely fashion, and for retraction, instrumentation, and closure.   ANESTHESIA:   gen  PREOPERATIVE INDICATIONS:  [name] is a  33 y.o. male with a diagnosis of ABSCESS who failed conservative measures and elected for surgical management.    The risks benefits and alternatives were discussed with the patient preoperatively including but not limited to the risks of infection, bleeding, nerve injury, cardiopulmonary complications, the need for revision surgery, among others, and the patient was willing to proceed.  OPERATIVE IMPLANTS: none  OPERATIVE FINDINGS: purulent fluid  BLOOD LOSS: min  COMPLICATIONS: none  TOURNIQUET TIME: 63min  OPERATIVE PROCEDURE:  Patient was identified in the preoperative holding area and site was marked by me He was transported to the operating theater and placed on the table in supine position taking care to pad all bony prominences. After a preincinduction time out anesthesia was induced. The Right upper extremity was prepped and draped in normal sterile fashion and a pre-incision timeout was performed. He received ancef for preoperative antibiotics.   Initially examined his forearm seropurulent drainage and made a longitudinal incision extending this wound proximally and distally.  I was able to probe with a hemostat and expressed purulent fluid I sent this for culture.  I debrided any necrotic fascia muscle and skin with pickups and Metzenbaum scissors.   I thoroughly irrigated the space.  Wound closure complex closure was performed using a nylon stitch.  Next I turned my attention was olecranon bursa there was erythema  tenderness preoperatively and boggy fluid collection performed a longitudinal incision here minimal purulent fluid primarily edematous fluid was noted debrided and performed a bursectomy.  After this I&D of his bursa and bursectomy performed the closure here as well with nylon stitches.  Sterile dressings were applied to the arm he was awoken and taken the PACU in stable condition  POST OPERATIVE PLAN: Mobilize for dvt px. Wound check in 1wk

## 2019-04-29 NOTE — Progress Notes (Addendum)
PROGRESS NOTE    Jon Castro  KNL:[id number] DOB: 01/27/1986 DOA: 04/27/2019 PCP: Patient, [name] Per   Brief Narrative:  Jon Castro is a 33 y.o. male with medical history significant of IV drug abuse, chronic pain syndrome, recent suicide attempt with transfer to behavioral health who is on Suboxone and apparently unable to get the clinic and get his Suboxone so he used heroin today.  Since he was released from behavioral health he has noticed increase in size of a lesion on his right forearm with fever.  Now the whole hand is swollen tender painful.  Patient was seen in the ER suspected abscess.  Initial I&D done superficially and area dressed.  He is being admitted with cellulitis.  Patient's pain is 9 out of 10 at the moment.  He attempted to shoot the heroin in the area.  No contact with anybody was covered.  No previous bacteremia or sepsis denied any MRSA infections.  Patient is being admitted for IV antibiotic and supportive care. In ED temperature 103 blood pressure 97/61 pulse 153 respiratory rate of 20 oxygen sat 95% room air.  White count 16,000 hemoglobin 12.4 and platelet 227.  Sodium 133 potassium 4.1 chloride 100 CO2 24 with BUN 11 and creatinine 1.03.  Calcium 8.5 and glucose 206 INR 1.1.  Patient given IV Zosyn with initial incision and drainage and is being admitted to the hospital for treatment   Assessment & Plan:   Principal Problem:   Right forearm cellulitis Active Problems:   MDD (major depressive disorder), severe (HCC)   Opioid dependence with opioid-induced mood disorder (HCC)   Hyperglycemia   Leucocytosis   LFTs abnormal   Sepsis in the setting of acute/subacute cellulitis and abscess of the right forearm, POA Concurrent bacteremia - prelim gm+ cocci.  POA.   Secondary to IV drug use, admits to injecting in this region a few days to a week prior to initial erythema and pain CT of the right upper extremity as below markable for 2 areas of fluid concerning  for deeper myositis and abscess as well as more superficial area. Patient did have superficial I&D in the emergency department 04/27/19 Orthopedics performed repeat deep I&D and washout on 04/28/19 Blood sugars reported positive, gram-positive cocci, ID following recommending TTE Repeat blood cultures 04/29/2019 pending Continue IV antibiotics with vancomycin, follow for operative cultures, repeat blood cultures  Depression with anxiety history of suicidal ideation, stable:  Patient was just released from behavioral health.   Continue home medications once verified, patient's home med rec currently is empty  Incidentally noted hyperglycemia:  No personal history of diabetes, unclear family history A1c now ordered twice - unclear if collection issue Glucose remains elevated - start low dose sliding scale  Elevated LFTs:  Unclear etiology, concern for polypharmacy and illicit drug/substance use ALT/AST downtrending appropriately Albumin downtrending to 2.6 Repeat liver panel this morning appears somewhat improving but not yet resolved No abdominal pain, patient tolerating p.o. quite well without difficulty Tylenol level at admission undetectable Patient declines any recent alcohol, EtOH level at admission undetectable  DVT prophylaxis: SCD Code Status: Full Family Communication: No family at bedside Disposition Plan:  Pending clinical course, likely discharge home however given patient's new blood culture positive results likely will need prolonged hospital stay with IV antibiotics given high risk for PICC line abuse at home in the setting of IV drug abuse history. Consults called: Orthopedics Admission status: Inpatient, continues to require IV antibiotics, possible further surgical evaluation and intervention.  Subjective: No acute issues or events overnight, pain well controlled, tolerating p.o. well, denies chest pain, shortness of breath, nausea, vomiting, diarrhea, constipation,  headache, fever, chills  Objective: Vitals:   04/28/19 2308 04/29/19 0000 04/29/19 0100 04/29/19 0415  BP: 100/64 103/65 103/62 109/73  Pulse: 70 72 67 62  Resp: 18 20 18 18   Temp: 98.2 F (36.8 C) 98.4 F (36.9 C) 97.7 F (36.5 C) (!) 97.5 F (36.4 C)  TempSrc: Oral Oral Oral Oral  SpO2: 98% 99% 99% 100%  Weight:      Height:        Intake/Output Summary (Last 24 hours) at 04/29/2019 0804 Last data filed at 04/29/2019 0612 Gross per 24 hour  Intake 1710 ml  Output 405 ml  Net 1305 ml   Filed Weights   04/27/19 2257 04/27/19 2318  Weight: 75 kg 77.1 kg    Examination:  General exam: Appears calm and comfortable  Respiratory system: Clear to auscultation. Respiratory effort normal. Cardiovascular system: S1 & S2 heard, RRR. No JVD, murmurs, rubs, gallops or clicks. No pedal edema. Gastrointestinal system: Abdomen is nondistended, soft and nontender. No organomegaly or masses felt. Normal bowel sounds heard. Central nervous system: Alert and oriented. No focal neurological deficits. Extremities: Right medial forearm erythematous, markedly tender, somewhat firm, erythema nearly circumferential involving the distal forearm and elbow, decreased range of motion of the right elbow.  Wallace CullensGray to green purulent material noted from medial I&D site, scant blood. Skin: No rashes, lesions or ulcers other than above  Data Reviewed: I have personally reviewed following labs and imaging studies  CBC: Recent Labs  Lab 04/27/19 2253 04/28/19 0411  WBC 16.0* 14.6*  NEUTROABS 12.0*  --   HGB 12.4* 10.8*  HCT 39.5 34.9*  MCV 84.2 84.5  PLT 227 152   Basic Metabolic Panel: Recent Labs  Lab 04/27/19 2253 04/28/19 0411  NA 133* 134*  K 4.1 3.9  CL 100 103  CO2 24 23  GLUCOSE 206* 137*  BUN 11 9  CREATININE 1.03 0.97  CALCIUM 8.5* 7.7*   GFR: Estimated Creatinine Clearance: 117 mL/min (by C-G formula based on SCr of 0.97 mg/dL). Liver Function Tests: Recent Labs  Lab  04/27/19 2253 04/28/19 0411  AST 184* 128*  ALT 325* 253*  ALKPHOS 162* 128*  BILITOT 1.6* 1.6*  PROT 6.5 5.5*  ALBUMIN 3.3* 2.7*   Coagulation Profile: Recent Labs  Lab 04/27/19 2253  INR 1.1   Sepsis Labs: Recent Labs  Lab 04/27/19 2253 04/28/19 0411  LATICACIDVEN 1.6 1.1    Radiology Studies: Ct Forearm Right W Contrast  Result Date: 04/28/2019 CLINICAL DATA:  Soft tissue infection EXAM: CT OF THE UPPER RIGHT EXTREMITY WITH CONTRAST TECHNIQUE: Multidetector CT imaging of the upper right extremity was performed according to the standard protocol following intravenous contrast administration. COMPARISON:  None. CONTRAST:  100mL OMNIPAQUE IOHEXOL 300 MG/ML  SOLN FINDINGS: Bones/Joint/Cartilage No fracture or malalignment. No periostitis or bone destruction. No significant elbow effusion. Ligaments Suboptimally assessed by CT. Muscles and Tendons Low-attenuation within the muscles of the proximal forearm on the volar and ulnar side. Triceps tendon grossly intact. Soft tissues Marked subcutaneous edema and fluid throughout the forearm. Rim enhancing fluid collection within the subcutaneous fat measuring 2.3 by 3.7 x 1.7 cm within the proximal forearm, volar side about 9 cm distal to the elbow joint. No soft tissue emphysema or radiopaque foreign body. IMPRESSION: 1. No acute osseous abnormality. 2. Extensive subcutaneous fluid and edema throughout the  forearm consistent with cellulitis. 2.3 x 3.7 x 1.7 cm rim enhancing fluid collection within the superficial soft tissues of the proximal forearm on the volar side, about 9 cm distal to the elbow joint consistent with a soft tissue abscess. 3. Low-attenuation within the proximal forearm muscles on the volar and ulnar side the forearm compatible with myositis and possible intramuscular abscess though no strong rim enhancement. MRI would better delineate the extent of soft tissue/muscular involvement. Electronically Signed   By: Donavan Foil  M.D.   On: 04/28/2019 04:02        Scheduled Meds:  acetaminophen  1,000 mg Oral Q8H   Chlorhexidine Gluconate Cloth  6 each Topical Q0600   cloNIDine  0.1 mg Oral QID   Followed by   cloNIDine  0.1 mg Oral BID   Followed by   Derrill Memo ON 05/02/2019] cloNIDine  0.1 mg Oral Daily   docusate sodium  100 mg Oral BID   fentaNYL       fentaNYL       gabapentin  300 mg Oral TID   methocarbamol       mupirocin ointment  1 application Nasal BID   Continuous Infusions:  sodium chloride 100 mL/hr at 04/28/19 0350   methocarbamol (ROBAXIN) IV     vancomycin 1,000 mg (04/29/19 0612)    LOS: 1 day   Time spent: 42min  Lunabella Badgett C Alphus Zeck, DO [facility]  If 7PM-7AM, please contact night-coverage [email or web address] Password [email or web address] 04/29/2019, 8:04 AM

## 2019-04-29 NOTE — Progress Notes (Signed)
Echocardiogram 2D Echocardiogram has been performed.  Oneal Deputy Bayler Nehring 04/29/2019, 2:45 PM

## 2019-04-29 NOTE — Progress Notes (Signed)
Pt is angry because Dilaudid was D/Cd and he wants something for breakthrough pain. On call MD notified, no new orders received.

## 2019-04-29 NOTE — Progress Notes (Signed)
    Subjective: Patient reports pain as mild to moderate, controlled.    Objective:   VITALS:   Vitals:   04/28/19 2308 04/29/19 0000 04/29/19 0100 04/29/19 0415  BP: 100/64 103/65 103/62 109/73  Pulse: 70 72 67 62  Resp: 18 20 18 18   Temp: 98.2 F (36.8 C) 98.4 F (36.9 C) 97.7 F (36.5 C) (!) 97.5 F (36.4 C)  TempSrc: Oral Oral Oral Oral  SpO2: 98% 99% 99% 100%  Weight:      Height:       CBC Latest Ref Rng & Units 04/28/2019 04/27/2019 04/22/2019  WBC 4.0 - 10.5 K/uL 14.6(H) 16.0(H) 8.7  Hemoglobin 13.0 - 17.0 g/dL 10.8(L) 12.4(L) 12.9(L)  Hematocrit 39.0 - 52.0 % 34.9(L) 39.5 41.5  Platelets 150 - 400 K/uL 152 227 293   BMP Latest Ref Rng & Units 04/28/2019 04/27/2019 04/22/2019  Glucose 70 - 99 mg/dL 137(H) 206(H) 122(H)  BUN 6 - 20 mg/dL 9 11 9   Creatinine 0.61 - 1.24 mg/dL 0.97 1.03 0.86  Sodium 135 - 145 mmol/L 134(L) 133(L) 136  Potassium 3.5 - 5.1 mmol/L 3.9 4.1 4.1  Chloride 98 - 111 mmol/L 103 100 104  CO2 22 - 32 mmol/L 23 24 23   Calcium 8.9 - 10.3 mg/dL 7.7(L) 8.5(L) 8.6(L)   Intake/Output      08/30 0701 - 08/31 0700 08/31 0701 - 09/01 0700   P.O. 660    I.V. (mL/kg) 650 (8.4)    IV Piggyback 400    Total Intake(mL/kg) 1710 (22.2)    Urine (mL/kg/hr) 400 (0.2)    Stool 0    Blood 5    Total Output 405    Net +1305         Urine Occurrence 1 x    Stool Occurrence 1 x       Physical Exam: General: NAD.  Upright in bed.  Calm, conversant. MSK RUE: Neurovascularly intact Sensation intact distally Hand warm Incision: dressings C/D/I  Assessment: 1 Day Post-Op  S/P Procedure(s) (LRB): IRRIGATION AND DEBRIDEMENT ARM ABSCESS (Right) by Dr. Ernesta Amble. Percell Miller on 04/28/2019  Principal Problem:   Right forearm cellulitis Active Problems:   MDD (major depressive disorder), severe (HCC)   Opioid dependence with opioid-induced mood disorder (HCC)   Hyperglycemia   Leucocytosis   LFTs abnormal   Right arm cellulitis/abscess is post I&D Doing  well postop day 1 Tolerating diet and voiding Pain controlled   Plan: Incentive Spirometry Elevate at all times. Continue antibiotics  Weightbearing: NWB RUE.  Elevate. Insicional and dressing care: Compressive Dressings left intact until follow-up Showering: Keep dressing dry Pain control: Minimize and wean narcotics.   Follow - up plan: 1 week - Friday 05/03/2019 In the a.m. Contact information:  Edmonia Lynch MD, Roxan Hockey PA-C  Dispo: Discharge when ready medically.  Follow-up with Dr. Percell Miller this Friday a.m.   Prudencio Burly III, PA-C 04/29/2019, 7:49 AM

## 2019-04-29 NOTE — Consult Note (Signed)
[facility]    Date of Admission:  04/27/2019           Day 2 vancomycin       Reason for Consult: Automatic consultation for MRSA bacteremia     Assessment: He had a soft tissue infection of his right forearm complicated by MRSA bacteremia. Will continue vancomycin obtain repeat blood cultures and TTE.  Plan: 1. Continue vancomycin 2. Repeat blood cultures 3. Transthoracic echocardiogram  Principal Problem:   Abscess of right forearm Active Problems:   MRSA bacteremia   IVDU (intravenous drug user)   MDD (major depressive disorder), severe ([place])   Opioid dependence with opioid-induced mood disorder (HCC)   Hyperglycemia   Leucocytosis   LFTs abnormal   Scheduled Meds:  acetaminophen  1,000 mg Oral Q8H   Chlorhexidine Gluconate Cloth  6 each Topical Q0600   cloNIDine  0.1 mg Oral QID   Followed by   cloNIDine  0.1 mg Oral BID   Followed by   Derrill Memo ON 05/02/2019] cloNIDine  0.1 mg Oral Daily   docusate sodium  100 mg Oral BID   gabapentin  300 mg Oral TID   mupirocin ointment  1 application Nasal BID   Continuous Infusions:  sodium chloride 100 mL/hr at 04/28/19 0350   methocarbamol (ROBAXIN) IV     vancomycin 1,000 mg (04/29/19 0612)   PRN Meds:.acetaminophen, diphenhydrAMINE, HYDROmorphone (DILAUDID) injection, ketorolac, methocarbamol **OR** methocarbamol (ROBAXIN) IV, ondansetron **OR** ondansetron (ZOFRAN) IV, oxyCODONE, polyethylene glycol  HPI: Jon Castro is a 33 y.o. male with a history of major depression, polysubstance addiction intravenous drug use.  He developed cellulitis of his right forearm several weeks ago.  Area recently began to swell and become more painful.  He was admitted with fever and a right forearm abscess and incision and drainage this morning.  Blood cultures are growing MRSA.  Abscess Gram stain shows gram-positive cocci.   Review of Systems: Review of Systems  Constitutional:  Positive for chills, diaphoresis and fever.  Respiratory: Negative for cough, sputum production and shortness of breath.   Cardiovascular: Negative for chest pain.  Gastrointestinal: Negative for abdominal pain, diarrhea, nausea and vomiting.  Musculoskeletal: Positive for joint pain.  Psychiatric/Behavioral: Positive for depression and substance abuse.    Past Medical History:  Diagnosis Date   Back pain    IV drug abuse ([place])     Social History   Tobacco Use   Smoking status: Current Some Day Smoker   Smokeless tobacco: Never Used  Substance Use Topics   Alcohol use: No   Drug use: Yes    Types: Marijuana, Heroin    No family history on file. Allergies  Allergen Reactions   Vicodin [Hydrocodone-Acetaminophen] Itching   Codeine Nausea And Vomiting   Tramadol Other (See Comments)    "messes with head"    OBJECTIVE: Blood pressure 109/73, pulse 62, temperature (!) 97.5 F (36.4 C), temperature source Oral, resp. rate 18, height 6\' 1"  (1.854 m), weight 77.1 kg, SpO2 100 %.  Physical Exam Constitutional:      Comments: He is resting quietly in bed watching television.  He is soft spoken and pleasant.  Cardiovascular:     Rate and Rhythm: Normal rate and regular rhythm.     Heart sounds: No murmur.  Pulmonary:     Effort: Pulmonary effort is normal.     Breath sounds: Normal breath sounds.  Abdominal:     Palpations:  Abdomen is soft.     Tenderness: There is no abdominal tenderness.  Musculoskeletal:     Comments: He has a bulky surgical dressing on his right forearm and hand.  Psychiatric:        Mood and Affect: Mood normal.     Lab Results Lab Results  Component Value Date   WBC 9.5 04/29/2019   HGB 11.2 (L) 04/29/2019   HCT 35.4 (L) 04/29/2019   MCV 84.7 04/29/2019   PLT 71 (L) 04/29/2019    Lab Results  Component Value Date   CREATININE 0.79 04/29/2019   BUN 8 04/29/2019   NA 137 04/29/2019   K 4.2 04/29/2019   CL 108 04/29/2019   CO2  19 (L) 04/29/2019    Lab Results  Component Value Date   ALT 210 (H) 04/29/2019   AST 103 (H) 04/29/2019   ALKPHOS 130 (H) 04/29/2019   BILITOT 0.8 04/29/2019     Microbiology: Recent Results (from the past 240 hour(s))  Culture, blood (Routine x 2)     Status: Abnormal (Preliminary result)   Collection Time: 04/27/19 10:45 PM   Specimen: BLOOD RIGHT ARM  Result Value Ref Range Status   Specimen Description BLOOD RIGHT ARM  Final   Special Requests   Final    BOTTLES DRAWN AEROBIC AND ANAEROBIC Blood Culture adequate volume   Culture  Setup Time   Final    GRAM POSITIVE COCCI AEROBIC BOTTLE ONLY CRITICAL RESULT CALLED TO, READ BACK BY AND VERIFIED WITH: L. POWELL, PHARMD AT 1935 ON 04/28/19 BY C. JESSUP, MT.    Culture (A)  Final    STAPHYLOCOCCUS AUREUS SUSCEPTIBILITIES TO FOLLOW Performed at [facility], [address] [place], [place] [address]    Report Status PENDING  Incomplete  Blood Culture ID Panel (Reflexed)     Status: Abnormal   Collection Time: 04/27/19 10:45 PM  Result Value Ref Range Status   Enterococcus species NOT DETECTED NOT DETECTED Final   Listeria monocytogenes NOT DETECTED NOT DETECTED Final   Staphylococcus species DETECTED (A) NOT DETECTED Final    Comment: CRITICAL RESULT CALLED TO, READ BACK BY AND VERIFIED WITH: L. POWELL, PHARMD AT 1935 ON 04/28/19 BY C. JESSUP, MT.    Staphylococcus aureus (BCID) DETECTED (A) NOT DETECTED Final    Comment: Methicillin (oxacillin)-resistant Staphylococcus aureus (MRSA). MRSA is predictably resistant to beta-lactam antibiotics (except ceftaroline). Preferred therapy is vancomycin unless clinically contraindicated. Patient requires contact precautions if  hospitalized. CRITICAL RESULT CALLED TO, READ BACK BY AND VERIFIED WITH: L. POWELL, PHARMD AT 1935 ON 04/28/19 BY C. JESSUP, MT.    Methicillin resistance DETECTED (A) NOT DETECTED Final    Comment: CRITICAL RESULT CALLED TO, READ BACK BY AND VERIFIED  WITH: L. POWELL, PHARMD AT 1935 ON 04/28/19 BY C. JESSUP, MT.    Streptococcus species NOT DETECTED NOT DETECTED Final   Streptococcus agalactiae NOT DETECTED NOT DETECTED Final   Streptococcus pneumoniae NOT DETECTED NOT DETECTED Final   Streptococcus pyogenes NOT DETECTED NOT DETECTED Final   Acinetobacter baumannii NOT DETECTED NOT DETECTED Final   Enterobacteriaceae species NOT DETECTED NOT DETECTED Final   Enterobacter cloacae complex NOT DETECTED NOT DETECTED Final   Escherichia coli NOT DETECTED NOT DETECTED Final   Klebsiella oxytoca NOT DETECTED NOT DETECTED Final   Klebsiella pneumoniae NOT DETECTED NOT DETECTED Final   Proteus species NOT DETECTED NOT DETECTED Final   Serratia marcescens NOT DETECTED NOT DETECTED Final   Haemophilus influenzae NOT DETECTED  NOT DETECTED Final   Neisseria meningitidis NOT DETECTED NOT DETECTED Final   Pseudomonas aeruginosa NOT DETECTED NOT DETECTED Final   Candida albicans NOT DETECTED NOT DETECTED Final   Candida glabrata NOT DETECTED NOT DETECTED Final   Candida krusei NOT DETECTED NOT DETECTED Final   Candida parapsilosis NOT DETECTED NOT DETECTED Final   Candida tropicalis NOT DETECTED NOT DETECTED Final    Comment: Performed at [facility], [address] [place], [place] [address]  Culture, blood (Routine x 2)     Status: None (Preliminary result)   Collection Time: 04/27/19 10:55 PM   Specimen: BLOOD  Result Value Ref Range Status   Specimen Description BLOOD RIGHT HAND  Final   Special Requests   Final    BOTTLES DRAWN AEROBIC ONLY Blood Culture results may not be optimal due to an excessive volume of blood received in culture bottles   Culture   Final    NO GROWTH 1 DAY Performed at [facility], [address], [place], [place] [address]    Report Status PENDING  Incomplete  SARS CORONAVIRUS 2 (TAT 6-12 HRS) Nasal Swab Aptima Multi Swab     Status: None   Collection Time: 04/28/19  3:21 AM   Specimen: Aptima  Multi Swab; Nasal Swab  Result Value Ref Range Status   SARS Coronavirus 2 NEGATIVE NEGATIVE Final    Comment: (NOTE) SARS-CoV-2 target nucleic acids are NOT DETECTED. The SARS-CoV-2 RNA is generally detectable in upper and lower respiratory specimens during the acute phase of infection. Negative results do not preclude SARS-CoV-2 infection, do not rule out co-infections with other pathogens, and should not be used as the sole basis for treatment or other patient management decisions. Negative results must be combined with clinical observations, patient history, and epidemiological information. The expected result is Negative. Fact Sheet for Patients: HairSlick.no Fact Sheet for Healthcare Providers: quierodirigir.com This test is not yet approved or cleared by the [place] FDA and  has been authorized for detection and/or diagnosis of SARS-CoV-2 by FDA under an Emergency Use Authorization (EUA). This EUA will remain  in effect (meaning this test can be used) for the duration of the COVID-19 declaration under Section 56 4(b)(1) of the Act, 21 U.S.C. section 360bbb-3(b)(1), unless the authorization is terminated or revoked sooner. Performed at [facility], [address], [place], [place] [address]   SARS Coronavirus 2 [facility] order, Performed in [facility] hospital lab) Nasopharyngeal Nasopharyngeal Swab     Status: None   Collection Time: 04/28/19  3:14 PM   Specimen: Nasopharyngeal Swab  Result Value Ref Range Status   SARS Coronavirus 2 NEGATIVE NEGATIVE Final    Comment: (NOTE) If result is NEGATIVE SARS-CoV-2 target nucleic acids are NOT DETECTED. The SARS-CoV-2 RNA is generally detectable in upper and lower  respiratory specimens during the acute phase of infection. The lowest  concentration of SARS-CoV-2 viral copies this assay can detect is 250  copies / mL. A negative result does not preclude  SARS-CoV-2 infection  and should not be used as the sole basis for treatment or other  patient management decisions.  A negative result may occur with  improper specimen collection / handling, submission of specimen other  than nasopharyngeal swab, presence of viral mutation(s) within the  areas targeted by this assay, and inadequate number of viral copies  (<250 copies / mL). A negative result must be combined with clinical  observations, patient history, and epidemiological information.  If result is POSITIVE SARS-CoV-2 target nucleic acids are DETECTED. The SARS-CoV-2 RNA is generally detectable in upper and lower  respiratory specimens dur ing the acute phase of infection.  Positive  results are indicative of active infection with SARS-CoV-2.  Clinical  correlation with patient history and other diagnostic information is  necessary to determine patient infection status.  Positive results do  not rule out bacterial infection or co-infection with other viruses. If result is PRESUMPTIVE POSTIVE SARS-CoV-2 nucleic acids MAY BE PRESENT.   A presumptive positive result was obtained on the submitted specimen  and confirmed on repeat testing.  While 2019 novel coronavirus  (SARS-CoV-2) nucleic acids may be present in the submitted sample  additional confirmatory testing may be necessary for epidemiological  and / or clinical management purposes  to differentiate between  SARS-CoV-2 and other Sarbecovirus currently known to infect humans.  If clinically indicated additional testing with an alternate test  methodology [phone] is advised. The SARS-CoV-2 RNA is generally  detectable in upper and lower respiratory sp ecimens during the acute  phase of infection. The expected result is Negative. Fact Sheet for Patients:  BoilerBrush.com.cyhttps://www.fda.gov/media/136312/download Fact Sheet for Healthcare Providers: https://pope.com/https://www.fda.gov/media/136313/download This test is not yet approved or cleared by the  [place] FDA and has been authorized for detection and/or diagnosis of SARS-CoV-2 by FDA under an Emergency Use Authorization (EUA).  This EUA will remain in effect (meaning this test can be used) for the duration of the COVID-19 declaration under Section 564(b)(1) of the Act, 21 U.S.C. section 360bbb-3(b)(1), unless the authorization is terminated or revoked sooner. Performed at [facility], [address] [place], [place] [address]   MRSA PCR Screening     Status: Abnormal   Collection Time: 04/28/19  4:21 PM   Specimen: Nasal Mucosa; Nasopharyngeal  Result Value Ref Range Status   MRSA by PCR POSITIVE (A) NEGATIVE Final    Comment:        The GeneXpert MRSA Assay (FDA approved for NASAL specimens only), is one component of a comprehensive MRSA colonization surveillance program. It is not intended to diagnose MRSA infection nor to guide or monitor treatment for MRSA infections. CRITICAL RESULT CALLED TO, READ BACK BY AND VERIFIED WITH: RN [facility] IRISH [phone] [id number] FCP Performed at [facility], [address] [place], [place] [address]   Aerobic/Anaerobic Culture (surgical/deep wound)     Status: None (Preliminary result)   Collection Time: 04/28/19  8:08 PM   Specimen: Abscess  Result Value Ref Range Status   Specimen Description ABSCESS RIGHT FOREARM  Final   Special Requests PATIENT ON FOLLOWING VANCOMYCIN,ANCEF  Final   Gram Stain   Final    MODERATE WBC PRESENT, PREDOMINANTLY PMN FEW GRAM POSITIVE COCCI Performed at [facility], [address] [place], [place] [address]    Culture PENDING  Incomplete   Report Status PENDING  Incomplete    Cliffton AstersJohn Lynnsie Linders, MD [facility] [phone] pager   [phone] cell 04/29/2019, 12:10 PM

## 2019-04-30 DIAGNOSIS — A4102 Sepsis due to Methicillin resistant Staphylococcus aureus: Principal | ICD-10-CM

## 2019-04-30 LAB — VANCOMYCIN, RANDOM: Vancomycin Rm: 25

## 2019-04-30 LAB — VANCOMYCIN, TROUGH: Vancomycin Tr: 13 ug/mL — ABNORMAL LOW (ref 15–20)

## 2019-04-30 MED ORDER — VANCOMYCIN HCL 10 G IV SOLR
1500.0000 mg | Freq: Two times a day (BID) | INTRAVENOUS | Status: DC
  Administered 2019-05-01 – 2019-05-07 (×13): 1500 mg via INTRAVENOUS
  Filled 2019-04-30 (×14): qty 1500

## 2019-04-30 NOTE — Progress Notes (Signed)
PROGRESS NOTE    Jon Castro  BOF:[id number] DOB: 1985-10-26 DOA: 04/27/2019 PCP: Patient, [name] Per   Brief Narrative:  Jon Castro is a 33 y.o. male with medical history significant of IV drug abuse, chronic pain syndrome, recent suicide attempt with transfer to behavioral health who is on Suboxone and apparently unable to get the clinic and get his Suboxone so he used heroin today.  Since he was released from behavioral health he has noticed increase in size of a lesion on his right forearm with fever.  Now the whole hand is swollen tender painful.  Patient was seen in the ER suspected abscess.  Initial I&D done superficially and area dressed.  He is being admitted with cellulitis.  Patient's pain is 9 out of 10 at the moment.  He attempted to shoot the heroin in the area.  No contact with anybody was covered.  No previous bacteremia or sepsis denied any MRSA infections.  Patient is being admitted for IV antibiotic and supportive care. In ED temperature 103 blood pressure 97/61 pulse 153 respiratory rate of 20 oxygen sat 95% room air.  White count 16,000 hemoglobin 12.4 and platelet 227.  Sodium 133 potassium 4.1 chloride 100 CO2 24 with BUN 11 and creatinine 1.03.  Calcium 8.5 and glucose 206 INR 1.1.  Patient given IV Zosyn with initial incision and drainage and is being admitted to the hospital for treatment   Assessment & Plan:   Principal Problem:   Abscess of right forearm Active Problems:   MDD (major depressive disorder), severe (HCC)   Opioid dependence with opioid-induced mood disorder (HCC)   Hyperglycemia   Leucocytosis   LFTs abnormal   MRSA bacteremia   IVDU (intravenous drug user)    Sepsis in the setting of acute/subacute cellulitis and abscess of the right forearm, POA Concurrent bacteremia - prelim gm+ cocci.  POA.   Secondary to IV drug use, admits to injecting in this region a few days to a week prior to initial erythema and pain CT of the right upper  extremity as below markable for 2 areas of fluid concerning for deeper myositis and abscess as well as more superficial area. Patient did have superficial I&D in the emergency department 04/27/19 Orthopedics performed repeat deep I&D and washout on 04/28/19 Blood cultures reported positive, MRSA Intraoperative culture preliminary staph aureus ID following, we appreciate insight and recommendations Continue IV vancomycin TTE -was noted to be without overt vegetation Repeat blood cultures 04/29/2019 pending (preliminary negative)  Depression with anxiety history of suicidal ideation, stable:  Patient was just released from behavioral health.   Continue home medications once verified, patient's home med rec currently is empty  Incidentally noted hyperglycemia:  No personal history of diabetes, unclear family history A1c 5.8, discontinue all point-of-care glucose checks, sliding scale insulin  Elevated LFTs:  Unclear etiology, concern for polypharmacy and illicit drug/substance use Repeat liver panel, improving daily but not yet resolved No abdominal pain, patient tolerating p.o. quite well Tylenol level at admission undetectable Patient declines any recent alcohol, EtOH level at admission undetectable  DVT prophylaxis: SCD Code Status: Full Family Communication: No family at bedside Disposition Plan:  Pending clinical course, likely discharge home however given patient's new blood culture positive results likely will need prolonged hospital stay with IV antibiotics given high risk for PICC line abuse at home in the setting of IV drug abuse history. Consults called: Orthopedics Admission status: Inpatient, continues to require IV antibiotics, possible further surgical evaluation and intervention.  Subjective: No acute issues or events overnight, pain well controlled, tolerating p.o. well, denies chest pain, shortness of breath, nausea, vomiting, diarrhea, constipation, headache, fever,  chills  Objective: Vitals:   04/29/19 1301 04/29/19 1834 04/29/19 2119 04/30/19 0528  BP: 110/69 116/69 106/68 110/78  Pulse: 60 80 73 64  Resp: (!) 22  18 18   Temp: 97.6 F (36.4 C)  98.3 F (36.8 C) 98.3 F (36.8 C)  TempSrc: Oral  Oral Oral  SpO2: 100%  100% 100%  Weight:      Height:        Intake/Output Summary (Last 24 hours) at 04/30/2019 1341 Last data filed at 04/30/2019 0600 Gross per 24 hour  Intake 3282.48 ml  Output 0 ml  Net 3282.48 ml   Filed Weights   04/27/19 2257 04/27/19 2318  Weight: 75 kg 77.1 kg    Examination:  General exam: Appears calm and comfortable  Respiratory system: Clear to auscultation. Respiratory effort normal. Cardiovascular system: S1 & S2 heard, RRR. No JVD, murmurs, rubs, gallops or clicks. No pedal edema. Gastrointestinal system: Abdomen is nondistended, soft and nontender. No organomegaly or masses felt. Normal bowel sounds heard. Central nervous system: Alert and oriented. No focal neurological deficits. Extremities: Right upper extremity bandage clean dry intact, follow wound care/surgery note for postop wound description Skin: No rashes, lesions or ulcers other than above  Data Reviewed: I have personally reviewed following labs and imaging studies  CBC: Recent Labs  Lab 04/27/19 2253 04/28/19 0411 04/29/19 0800  WBC 16.0* 14.6* 9.5  NEUTROABS 12.0*  --   --   HGB 12.4* 10.8* 11.2*  HCT 39.5 34.9* 35.4*  MCV 84.2 84.5 84.7  PLT 227 152 71*   Basic Metabolic Panel: Recent Labs  Lab 04/27/19 2253 04/28/19 0411 04/29/19 0800  NA 133* 134* 137  K 4.1 3.9 4.2  CL 100 103 108  CO2 24 23 19*  GLUCOSE 206* 137* 261*  BUN 11 9 8   CREATININE 1.03 0.97 0.79  CALCIUM 8.5* 7.7* 8.3*   GFR: Estimated Creatinine Clearance: 141.9 mL/min (by C-G formula based on SCr of 0.79 mg/dL). Liver Function Tests: Recent Labs  Lab 04/27/19 2253 04/28/19 0411 04/29/19 0800  AST 184* 128* 103*  ALT 325* 253* 210*  ALKPHOS 162*  128* 130*  BILITOT 1.6* 1.6* 0.8  PROT 6.5 5.5* 5.7*  ALBUMIN 3.3* 2.7* 2.5*   Coagulation Profile: Recent Labs  Lab 04/27/19 2253  INR 1.1   Sepsis Labs: Recent Labs  Lab 04/27/19 2253 04/28/19 0411  LATICACIDVEN 1.6 1.1    Radiology Studies: No results found.      Scheduled Meds: . Chlorhexidine Gluconate Cloth  6 each Topical Q0600  . cloNIDine  0.1 mg Oral BID   Followed by  . [START ON 05/02/2019] cloNIDine  0.1 mg Oral Daily  . docusate sodium  100 mg Oral BID  . gabapentin  300 mg Oral TID  . insulin aspart  0-9 Units Subcutaneous TID WC  . mupirocin ointment  1 application Nasal BID   Continuous Infusions: . sodium chloride 100 mL/hr at 04/29/19 1000  . methocarbamol (ROBAXIN) IV    . vancomycin 1,000 mg (04/30/19 0545)    LOS: 2 days   Time spent: 35min  Azucena FallenWilliam C Griffin Gerrard, DO [facility]  If 7PM-7AM, please contact night-coverage [email or web address] Password [email or web address] 04/30/2019, 1:41 PM

## 2019-04-30 NOTE — Progress Notes (Signed)
[facility]   Reason for visit: Follow up on MRSA sepsis with RT forearm abscess  Antibiotics: Vancomycin, day 4  Interval History: TTE yesterday was unrevealing for endocarditis. Repeat blood cxs are currently pending. Pt has visitor in room that is clearly under the influence of illicit substances. Pt and visitor share with me that the visitor has actively injected heroin within the past 24 hrs and "wants to get clean." Pt's appetite has been good today. RT arm/hand swelling improved but pain persisting. Fever curves, WBC trends, cx results, ABX usage, imaging, and OP notes all independently reviewed    Current Facility-Administered Medications:  .  0.9 %  sodium chloride infusion, , Intravenous, Continuous, Martensen, Charna Elizabeth III, PA-C, Last Rate: 100 mL/hr at 04/29/19 1000 .  acetaminophen (TYLENOL) tablet 325-650 mg, 325-650 mg, Oral, Q6H PRN, Prudencio Burly III, PA-C .  Chlorhexidine Gluconate Cloth 2 % PADS 6 each, 6 each, Topical, Q0600, Prudencio Burly III, PA-C, 6 each at 04/30/19 0507 .  cloNIDine (CATAPRES) tablet 0.1 mg, 0.1 mg, Oral, QID, 0.1 mg at 04/29/19 1835 **FOLLOWED BY** cloNIDine (CATAPRES) tablet 0.1 mg, 0.1 mg, Oral, BID, 0.1 mg at 04/30/19 1024 **FOLLOWED BY** [START ON 05/02/2019] cloNIDine (CATAPRES) tablet 0.1 mg, 0.1 mg, Oral, Daily, Martensen, Charna Elizabeth III, PA-C .  diphenhydrAMINE (BENADRYL) 12.5 MG/5ML elixir 12.5-25 mg, 12.5-25 mg, Oral, Q4H PRN, Martensen, Charna Elizabeth III, PA-C .  docusate sodium (COLACE) capsule 100 mg, 100 mg, Oral, BID, Martensen, Charna Elizabeth III, PA-C, 100 mg at 04/30/19 1023 .  gabapentin (NEURONTIN) capsule 300 mg, 300 mg, Oral, TID, Martensen, Charna Elizabeth III, PA-C, 300 mg at 04/30/19 1023 .  insulin aspart (novoLOG) injection 0-9 Units, 0-9 Units, Subcutaneous, TID WC, Little Ishikawa, MD .  ketorolac (TORADOL) 30 MG/ML injection 30 mg, 30 mg, Intravenous, Q6H PRN, Prudencio Burly III, PA-C, 30 mg at 04/30/19 0543 .  methocarbamol (ROBAXIN) tablet 500 mg, 500 mg, Oral, Q6H PRN, 500 mg at 04/30/19 1032 **OR** methocarbamol (ROBAXIN) 500 mg in dextrose 5 % 50 mL IVPB, 500 mg, Intravenous, Q6H PRN, Martensen, Charna Elizabeth III, PA-C .  mupirocin ointment (BACTROBAN) 2 % 1 application, 1 application, Nasal, BID, Prudencio Burly III, PA-C, 1 application at 69/62/95 1023 .  ondansetron (ZOFRAN) tablet 4 mg, 4 mg, Oral, Q6H PRN **OR** ondansetron (ZOFRAN) injection 4 mg, 4 mg, Intravenous, Q6H PRN, Martensen, Charna Elizabeth III, PA-C .  oxyCODONE (Oxy IR/ROXICODONE) immediate release tablet 5-10 mg, 5-10 mg, Oral, Q6H PRN, Prudencio Burly III, PA-C, 10 mg at 04/30/19 1032 .  polyethylene glycol (MIRALAX / GLYCOLAX) packet 17 g, 17 g, Oral, Daily PRN, Martensen, Charna Elizabeth III, PA-C .  vancomycin (VANCOCIN) IVPB 1000 mg/200 mL premix, 1,000 mg, Intravenous, Q8H, Martensen, Charna Elizabeth III, PA-C, Last Rate: 200 mL/hr at 04/30/19 0545, 1,000 mg at 04/30/19 0545   Physical Exam:   Vitals:   04/29/19 2119 04/30/19 0528  BP: 106/68 110/78  Pulse: 73 64  Resp: 18 18  Temp: 98.3 F (36.8 C) 98.3 F (36.8 C)  SpO2: 100% 100%   Physical Exam Gen: anxious, mild distress secondary to RT arm pain, A&Ox 3 Head: NCAT, no temporal wasting evident EENT: PERRL, EOMI, MMM, adequate dentition Neck: supple, no JVD CV: NRRR, no murmurs appreciated Pulm: CTA bilaterally, mild expiratory wheeze, no retractions Abd: soft, NTND, +BS (hypoactive) Extrems: 1+ RT arm/hand edema, no LE edema, 2+ pulses Skin: multiple tattoos, adequate skin  turgor MSK: RT forearm and upper arm covered with compressive bandaging (no drain intact) Neuro: CN II-XII grossly intact, no focal neurologic deficits appreciated, gait was not assessed, A&Ox 3   Review of Systems:  Review of Systems  Constitutional: Negative for chills, fever and weight loss.  HENT: Negative for congestion,  hearing loss, sinus pain and sore throat.   Eyes: Negative for blurred vision, photophobia and discharge.  Respiratory: Negative for cough, hemoptysis and shortness of breath.   Cardiovascular: Negative for chest pain, palpitations, orthopnea and leg swelling.  Gastrointestinal: Negative for abdominal pain, constipation, diarrhea, heartburn, nausea and vomiting.  Genitourinary: Negative for dysuria, flank pain, frequency and urgency.  Musculoskeletal: Positive for joint pain and myalgias. Negative for back pain.       RT arm/elbow  Skin: Negative for itching and rash.  Neurological: Negative for tremors, seizures, weakness and headaches.  Endo/Heme/Allergies: Negative for polydipsia. Does not bruise/bleed easily.  Psychiatric/Behavioral: Positive for substance abuse. Negative for depression. The patient is nervous/anxious. The patient does not have insomnia.      Lab Results  Component Value Date   WBC 9.5 04/29/2019   HGB 11.2 (L) 04/29/2019   HCT 35.4 (L) 04/29/2019   MCV 84.7 04/29/2019   PLT 71 (L) 04/29/2019    Lab Results  Component Value Date   CREATININE 0.79 04/29/2019   BUN 8 04/29/2019   NA 137 04/29/2019   K 4.2 04/29/2019   CL 108 04/29/2019   CO2 19 (L) 04/29/2019    Lab Results  Component Value Date   ALT 210 (H) 04/29/2019   AST 103 (H) 04/29/2019   ALKPHOS 130 (H) 04/29/2019     Microbiology: Recent Results (from the past 240 hour(s))  Culture, blood (Routine x 2)     Status: Abnormal   Collection Time: 04/27/19 10:45 PM   Specimen: BLOOD RIGHT ARM  Result Value Ref Range Status   Specimen Description BLOOD RIGHT ARM  Final   Special Requests   Final    BOTTLES DRAWN AEROBIC AND ANAEROBIC Blood Culture adequate volume   Culture  Setup Time   Final    GRAM POSITIVE COCCI AEROBIC BOTTLE ONLY CRITICAL RESULT CALLED TO, READ BACK BY AND VERIFIED WITH: L. POWELL, PHARMD AT 1935 ON 04/28/19 BY C. JESSUP, MT. Performed at [facility], [address] [place], [place] [address]    Culture METHICILLIN RESISTANT STAPHYLOCOCCUS AUREUS (A)  Final   Report Status 04/30/2019 FINAL  Final   Organism ID, Bacteria METHICILLIN RESISTANT STAPHYLOCOCCUS AUREUS  Final      Susceptibility   Methicillin resistant staphylococcus aureus - MIC*    CIPROFLOXACIN <=0.5 SENSITIVE Sensitive     ERYTHROMYCIN >=8 RESISTANT Resistant     GENTAMICIN <=0.5 SENSITIVE Sensitive     OXACILLIN >=4 RESISTANT Resistant     TETRACYCLINE <=1 SENSITIVE Sensitive     VANCOMYCIN <=0.5 SENSITIVE Sensitive     TRIMETH/SULFA <=10 SENSITIVE Sensitive     CLINDAMYCIN <=0.25 SENSITIVE Sensitive     RIFAMPIN <=0.5 SENSITIVE Sensitive     Inducible Clindamycin NEGATIVE Sensitive     * METHICILLIN RESISTANT STAPHYLOCOCCUS AUREUS  Blood Culture ID Panel (Reflexed)     Status: Abnormal   Collection Time: 04/27/19 10:45 PM  Result Value Ref Range Status   Enterococcus species NOT DETECTED NOT DETECTED Final   Listeria monocytogenes NOT DETECTED NOT DETECTED Final   Staphylococcus species DETECTED (A) NOT DETECTED Final    Comment: CRITICAL RESULT CALLED  TO, READ BACK BY AND VERIFIED WITH: L. POWELL, PHARMD AT 1935 ON 04/28/19 BY C. JESSUP, MT.    Staphylococcus aureus (BCID) DETECTED (A) NOT DETECTED Final    Comment: Methicillin (oxacillin)-resistant Staphylococcus aureus (MRSA). MRSA is predictably resistant to beta-lactam antibiotics (except ceftaroline). Preferred therapy is vancomycin unless clinically contraindicated. Patient requires contact precautions if  hospitalized. CRITICAL RESULT CALLED TO, READ BACK BY AND VERIFIED WITH: L. POWELL, PHARMD AT 1935 ON 04/28/19 BY C. JESSUP, MT.    Methicillin resistance DETECTED (A) NOT DETECTED Final    Comment: CRITICAL RESULT CALLED TO, READ BACK BY AND VERIFIED WITH: L. POWELL, PHARMD AT 1935 ON 04/28/19 BY C. JESSUP, MT.    Streptococcus species NOT DETECTED NOT DETECTED Final   Streptococcus agalactiae NOT DETECTED NOT  DETECTED Final   Streptococcus pneumoniae NOT DETECTED NOT DETECTED Final   Streptococcus pyogenes NOT DETECTED NOT DETECTED Final   Acinetobacter baumannii NOT DETECTED NOT DETECTED Final   Enterobacteriaceae species NOT DETECTED NOT DETECTED Final   Enterobacter cloacae complex NOT DETECTED NOT DETECTED Final   Escherichia coli NOT DETECTED NOT DETECTED Final   Klebsiella oxytoca NOT DETECTED NOT DETECTED Final   Klebsiella pneumoniae NOT DETECTED NOT DETECTED Final   Proteus species NOT DETECTED NOT DETECTED Final   Serratia marcescens NOT DETECTED NOT DETECTED Final   Haemophilus influenzae NOT DETECTED NOT DETECTED Final   Neisseria meningitidis NOT DETECTED NOT DETECTED Final   Pseudomonas aeruginosa NOT DETECTED NOT DETECTED Final   Candida albicans NOT DETECTED NOT DETECTED Final   Candida glabrata NOT DETECTED NOT DETECTED Final   Candida krusei NOT DETECTED NOT DETECTED Final   Candida parapsilosis NOT DETECTED NOT DETECTED Final   Candida tropicalis NOT DETECTED NOT DETECTED Final    Comment: Performed at [facility], [address] [place], [place] [address]  Culture, blood (Routine x 2)     Status: None (Preliminary result)   Collection Time: 04/27/19 10:55 PM   Specimen: BLOOD  Result Value Ref Range Status   Specimen Description BLOOD RIGHT HAND  Final   Special Requests   Final    BOTTLES DRAWN AEROBIC ONLY Blood Culture results may not be optimal due to an excessive volume of blood received in culture bottles   Culture   Final    NO GROWTH 2 DAYS Performed at [facility], [address] [place], [place] [address]    Report Status PENDING  Incomplete  SARS CORONAVIRUS 2 (TAT 6-12 HRS) Nasal Swab Aptima Multi Swab     Status: None   Collection Time: 04/28/19  3:21 AM   Specimen: Aptima Multi Swab; Nasal Swab  Result Value Ref Range Status   SARS Coronavirus 2 NEGATIVE NEGATIVE Final    Comment: (NOTE) SARS-CoV-2 target nucleic acids are NOT  DETECTED. The SARS-CoV-2 RNA is generally detectable in upper and lower respiratory specimens during the acute phase of infection. Negative results do not preclude SARS-CoV-2 infection, do not rule out co-infections with other pathogens, and should not be used as the sole basis for treatment or other patient management decisions. Negative results must be combined with clinical observations, patient history, and epidemiological information. The expected result is Negative. Fact Sheet for Patients: HairSlick.nohttps://www.fda.gov/media/138098/download Fact Sheet for Healthcare Providers: quierodirigir.comhttps://www.fda.gov/media/138095/download This test is not yet approved or cleared by the [place] FDA and  has been authorized for detection and/or diagnosis of SARS-CoV-2 by FDA under an Emergency Use Authorization (EUA). This EUA will remain  in  effect (meaning this test can be used) for the duration of the COVID-19 declaration under Section 56 4(b)(1) of the Act, 21 U.S.C. section 360bbb-3(b)(1), unless the authorization is terminated or revoked sooner. Performed at [facility], [address] [place], [place] [address]   SARS Coronavirus 2 [facility](Hospital order, Performed in [facility] hospital lab) Nasopharyngeal Nasopharyngeal Swab     Status: None   Collection Time: 04/28/19  3:14 PM   Specimen: Nasopharyngeal Swab  Result Value Ref Range Status   SARS Coronavirus 2 NEGATIVE NEGATIVE Final    Comment: (NOTE) If result is NEGATIVE SARS-CoV-2 target nucleic acids are NOT DETECTED. The SARS-CoV-2 RNA is generally detectable in upper and lower  respiratory specimens during the acute phase of infection. The lowest  concentration of SARS-CoV-2 viral copies this assay can detect is 250  copies / mL. A negative result does not preclude SARS-CoV-2 infection  and should not be used as the sole basis for treatment or other  patient management decisions.  A negative result may occur with  improper  specimen collection / handling, submission of specimen other  than nasopharyngeal swab, presence of viral mutation(s) within the  areas targeted by this assay, and inadequate number of viral copies  (<250 copies / mL). A negative result must be combined with clinical  observations, patient history, and epidemiological information. If result is POSITIVE SARS-CoV-2 target nucleic acids are DETECTED. The SARS-CoV-2 RNA is generally detectable in upper and lower  respiratory specimens dur ing the acute phase of infection.  Positive  results are indicative of active infection with SARS-CoV-2.  Clinical  correlation with patient history and other diagnostic information is  necessary to determine patient infection status.  Positive results do  not rule out bacterial infection or co-infection with other viruses. If result is PRESUMPTIVE POSTIVE SARS-CoV-2 nucleic acids MAY BE PRESENT.   A presumptive positive result was obtained on the submitted specimen  and confirmed on repeat testing.  While 2019 novel coronavirus  (SARS-CoV-2) nucleic acids may be present in the submitted sample  additional confirmatory testing may be necessary for epidemiological  and / or clinical management purposes  to differentiate between  SARS-CoV-2 and other Sarbecovirus currently known to infect humans.  If clinically indicated additional testing with an alternate test  methodology [phone] is advised. The SARS-CoV-2 RNA is generally  detectable in upper and lower respiratory sp ecimens during the acute  phase of infection. The expected result is Negative. Fact Sheet for Patients:  BoilerBrush.com.cyhttps://www.fda.gov/media/136312/download Fact Sheet for Healthcare Providers: https://pope.com/https://www.fda.gov/media/136313/download This test is not yet approved or cleared by the [place] FDA and has been authorized for detection and/or diagnosis of SARS-CoV-2 by FDA under an Emergency Use Authorization (EUA).  This EUA will remain in  effect (meaning this test can be used) for the duration of the COVID-19 declaration under Section 564(b)(1) of the Act, 21 U.S.C. section 360bbb-3(b)(1), unless the authorization is terminated or revoked sooner. Performed at [facility], [address] [place], [place] [address]   MRSA PCR Screening     Status: Abnormal   Collection Time: 04/28/19  4:21 PM   Specimen: Nasal Mucosa; Nasopharyngeal  Result Value Ref Range Status   MRSA by PCR POSITIVE (A) NEGATIVE Final    Comment:        The GeneXpert MRSA Assay (FDA approved for NASAL specimens only), is one component of a comprehensive MRSA colonization surveillance program. It is not intended to diagnose MRSA infection nor  to guide or monitor treatment for MRSA infections. CRITICAL RESULT CALLED TO, READ BACK BY AND VERIFIED WITH: RN [facility] IRISH [phone] FCP Performed at [facility], [address], [place], [place] [address]   Aerobic/Anaerobic Culture (surgical/deep wound)     Status: None (Preliminary result)   Collection Time: 04/28/19  8:08 PM   Specimen: Abscess  Result Value Ref Range Status   Specimen Description ABSCESS RIGHT FOREARM  Final   Special Requests PATIENT ON FOLLOWING VANCOMYCIN,ANCEF  Final   Gram Stain   Final    MODERATE WBC PRESENT, PREDOMINANTLY PMN FEW GRAM POSITIVE COCCI Performed at [facility], [address], [place], [place] [address]    Culture ABUNDANT STAPHYLOCOCCUS AUREUS  Final   Report Status PENDING  Incomplete  Culture, blood (routine x 2)     Status: None (Preliminary result)   Collection Time: 04/29/19 12:20 PM   Specimen: BLOOD LEFT HAND  Result Value Ref Range Status   Specimen Description BLOOD LEFT HAND  Final   Special Requests   Final    BOTTLES DRAWN AEROBIC ONLY Blood Culture adequate volume   Culture   Final    NO GROWTH < 24 HOURS Performed at [facility], [address] [place], [place] [address]    Report Status PENDING   Incomplete  Culture, blood (routine x 2)     Status: None (Preliminary result)   Collection Time: 04/29/19 12:25 PM   Specimen: BLOOD  Result Value Ref Range Status   Specimen Description BLOOD LEFT ANTECUBITAL  Final   Special Requests   Final    BOTTLES DRAWN AEROBIC ONLY Blood Culture adequate volume   Culture   Final    NO GROWTH < 24 HOURS Performed at [facility], [address] [place], [place] [address]    Report Status PENDING  Incomplete    Impression/Plan: Patient is a 33 year old white male IV drug user admitted for MRSA sepsis and right forearm abscess, status post debridement.  1.  MRSA sepsis- patient's initial blood cultures were positive for MRSA.  Continue vancomycin dosed for goal trough of 15-20 for the current time.  TTE did not show evidence of endocarditis and repeat blood cultures thus far are showing no growth.  If he continues to show improvement with his right forearm abscess can likely limit antibiotic treatment to 3 weeks total, but will need to visualize the patient's right forearm wounds directly in order to make this assessment.  If short course of treatment is deemed appropriate.  His antibiotics may be able to be completed with p.o. Zyvox 600 mg twice daily, provided that repeat CBC shows a normalization of his platelet count, which had acutely declined to the high 70s on his most recent CBC.  Hopefully, will plan and if repeat platelet count is back to within normal limits tomorrow this may be a viable plan.  2. RT forearm abscess -appreciate orthopedics abrading the patient's wound on April 28, 2019.  Will need direct visualization of his wound as noted above in order to determine proper duration of antibiotics.  If significant improvement is confirmed, will likely limit antibiotic treatment to 3 weeks in total from the time of his right forearm debridement.  3.  IV drug use- HIV, hep B and hep C serologies were all negative in June 2020.  Consider  repeating these labs as the patient's IV drug use has been ongoing.  I counseled the patient extensively  regarding the relationship between his current infection and his ongoing IV drug use particularly with reuse of needles for multiple injections.  I also have concerns about his current visitor in the room who is clearly intoxicated on unknown substances.  My concern is that he may bring illicit drugs into the patient while he is here.  Nursing staff was made aware and may need to limit visitors should there be any concerns moving forward.

## 2019-04-30 NOTE — Progress Notes (Addendum)
Pharmacy Antibiotic Note  Jon Castro is a 33 y.o. male admitted on 04/27/2019 with RUE abcess/cellulitis and MRSA bacteremia.  Pharmacy has been consulted for vancomycin dosing. SCr is currently stable with CrCl > 100 ml/min.   Levels today came back with a peak of 25 and a trough of 13 with an estimated AUC of 504 which is therapeutic   Plan: Will adjust Vancomycin slightly to 1500 mg every 12 hours for ease of administration (Predicted AUC 503) Monitor renal function  Monitor levels as needed   Height: 6\' 1"  (185.4 cm) Weight: 170 lb (77.1 kg) IBW/kg (Calculated) : 79.9  Temp (24hrs), Avg:98.3 F (36.8 C), Min:98.3 F (36.8 C), Max:98.3 F (36.8 C)  Recent Labs  Lab 04/27/19 2253 04/28/19 0411 04/29/19 0800 04/30/19 0837 04/30/19 1327  WBC 16.0* 14.6* 9.5  --   --   CREATININE 1.03 0.97 0.79  --   --   LATICACIDVEN 1.6 1.1  --   --   --   VANCOTROUGH  --   --   --   --  13*  VANCORANDOM  --   --   --  25  --     Estimated Creatinine Clearance: 141.9 mL/min (by C-G formula based on SCr of 0.79 mg/dL).    Allergies  Allergen Reactions  . Vicodin [Hydrocodone-Acetaminophen] Itching  . Codeine Nausea And Vomiting  . Tramadol Other (See Comments)    "messes with head"    Jimmy Footman, PharmD, BCPS, BCIDP Infectious Diseases Clinical Pharmacist Phone: [phone] 04/30/2019 4:12 PM

## 2019-05-01 DIAGNOSIS — F1990 Other psychoactive substance use, unspecified, uncomplicated: Secondary | ICD-10-CM

## 2019-05-01 DIAGNOSIS — B49 Unspecified mycosis: Secondary | ICD-10-CM

## 2019-05-01 LAB — COMPREHENSIVE METABOLIC PANEL
ALT: 143 U/L — ABNORMAL HIGH (ref 0–44)
AST: 71 U/L — ABNORMAL HIGH (ref 15–41)
Albumin: 2.3 g/dL — ABNORMAL LOW (ref 3.5–5.0)
Alkaline Phosphatase: 125 U/L (ref 38–126)
Anion gap: 9 (ref 5–15)
BUN: 9 mg/dL (ref 6–20)
CO2: 22 mmol/L (ref 22–32)
Calcium: 8 mg/dL — ABNORMAL LOW (ref 8.9–10.3)
Chloride: 108 mmol/L (ref 98–111)
Creatinine, Ser: 0.93 mg/dL (ref 0.61–1.24)
GFR calc Af Amer: >60 mL/min (ref >60)
GFR calc non Af Amer: >60 mL/min (ref >60)
Glucose, Bld: 95 mg/dL (ref 70–99)
Potassium: 3.6 mmol/L (ref 3.5–5.1)
Sodium: 139 mmol/L (ref 135–145)
Total Bilirubin: 0.7 mg/dL (ref 0.3–1.2)
Total Protein: 5.7 g/dL — ABNORMAL LOW (ref 6.5–8.1)

## 2019-05-01 LAB — CBC
HCT: 38.3 % — ABNORMAL LOW (ref 39.0–52.0)
Hemoglobin: 11.9 g/dL — ABNORMAL LOW (ref 13.0–17.0)
MCH: 26.8 pg (ref 26.0–34.0)
MCHC: 31.1 g/dL (ref 30.0–36.0)
MCV: 86.3 fL (ref 80.0–100.0)
Platelets: 165 10*3/uL (ref 150–400)
RBC: 4.44 MIL/uL (ref 4.22–5.81)
RDW: 16 % — ABNORMAL HIGH (ref 11.5–15.5)
WBC: 6.4 10*3/uL (ref 4.0–10.5)
nRBC: 0 % (ref 0.0–0.2)

## 2019-05-01 LAB — BLOOD CULTURE ID PANEL (REFLEXED)
Acinetobacter baumannii: NOT DETECTED
Candida albicans: NOT DETECTED
Candida glabrata: DETECTED — AB
Candida krusei: NOT DETECTED
Candida parapsilosis: NOT DETECTED
Candida tropicalis: NOT DETECTED
Enterobacter cloacae complex: NOT DETECTED
Enterobacteriaceae species: NOT DETECTED
Enterococcus species: NOT DETECTED
Escherichia coli: NOT DETECTED
Haemophilus influenzae: NOT DETECTED
Klebsiella oxytoca: NOT DETECTED
Klebsiella pneumoniae: NOT DETECTED
Listeria monocytogenes: NOT DETECTED
Neisseria meningitidis: NOT DETECTED
Proteus species: NOT DETECTED
Pseudomonas aeruginosa: NOT DETECTED
Serratia marcescens: NOT DETECTED
Staphylococcus aureus (BCID): NOT DETECTED
Staphylococcus species: NOT DETECTED
Streptococcus agalactiae: NOT DETECTED
Streptococcus pneumoniae: NOT DETECTED
Streptococcus pyogenes: NOT DETECTED
Streptococcus species: NOT DETECTED

## 2019-05-01 MED ORDER — SODIUM CHLORIDE 0.9% FLUSH: 10.0000 mL | INTRAVENOUS | Status: DC | PRN

## 2019-05-01 MED ORDER — SODIUM CHLORIDE 0.9 % IV SOLN
100.0000 mg | INTRAVENOUS | Status: AC
  Administered 2019-05-02 – 2019-05-15 (×14): 100 mg via INTRAVENOUS
  Filled 2019-05-01 (×14): qty 100

## 2019-05-01 MED ORDER — SODIUM CHLORIDE 0.9 % IV SOLN
200.0000 mg | Freq: Once | INTRAVENOUS | Status: AC
  Administered 2019-05-01: 08:00:00 200 mg via INTRAVENOUS
  Filled 2019-05-01: qty 200

## 2019-05-01 NOTE — Progress Notes (Signed)
PROGRESS NOTE    Jon Castro  VFI:[id number] DOB: 08-05-1986 DOA: 04/27/2019 PCP: Patient, [name] Per   Brief Narrative:  Jon Castro is a 33 y.o. male with medical history significant of IV drug abuse, chronic pain syndrome, recent suicide attempt with transfer to behavioral health who is on Suboxone and apparently unable to get the clinic and get his Suboxone so he used heroin today.  Since he was released from behavioral health he has noticed increase in size of a lesion on his right forearm with fever.  Now the whole hand is swollen tender painful.  Patient was seen in the ER suspected abscess.  Initial I&D done superficially and area dressed.  He is being admitted with cellulitis.  Patient's pain is 9 out of 10 at the moment.  He attempted to shoot the heroin in the area.  No contact with anybody was covered.  No previous bacteremia or sepsis denied any MRSA infections.  Patient is being admitted for IV antibiotic and supportive care. In ED temperature 103 blood pressure 97/61 pulse 153 respiratory rate of 20 oxygen sat 95% room air.  White count 16,000 hemoglobin 12.4 and platelet 227.  Sodium 133 potassium 4.1 chloride 100 CO2 24 with BUN 11 and creatinine 1.03.  Calcium 8.5 and glucose 206 INR 1.1.  Patient given IV Zosyn with initial incision and drainage and is being admitted to the hospital for treatment   Assessment & Plan:   Principal Problem:   Abscess of right forearm Active Problems:   MDD (major depressive disorder), severe (HCC)   Opioid dependence with opioid-induced mood disorder (HCC)   Hyperglycemia   Leucocytosis   LFTs abnormal   MRSA bacteremia   IVDU (intravenous drug user)   Sepsis in the setting of acute/subacute cellulitis and abscess of the right forearm, POA Concurrent bacteremia -MRSA with concurrent candidemia.  POA.   Secondary to IV drug use, admits to injecting in this region a few days to a week prior to initial erythema and pain Superficial I&D  in the emergency department 04/27/19 Orthopedics performed repeat deep I&D and washout on 04/28/19 Blood cultures   MRSA +8/29  Candida +8/29  Repeat blood cultures 04/29/19 preliminary negative Intraoperative culture preliminary staph aureus ID following, we appreciate insight and recommendations:  Continue IV vancomycin - 04/26/19>> ongoing  Initiate IV Anidulafungin - 05/01/19 >> ongoing TTE -was noted to be without overt vegetation TEE now recommended per ID given new fungal cultures  Depression with anxiety history of suicidal ideation, stable:  Patient was just released from behavioral health prior to admission.   Continue home medications once verified, patient's home med rec currently is empty  Incidentally noted hyperglycemia:  No personal history of diabetes, unclear family history A1c 5.8, discontinue all point-of-care glucose checks, sliding scale insulin  Elevated LFTs, resolving:  Unclear etiology, concern for polypharmacy and illicit drug/substance use Repeat liver panel, improving daily but not yet resolved No abdominal pain, patient tolerating p.o. quite well Tylenol level at admission undetectable Patient declines any recent alcohol, EtOH level at admission undetectable  DVT prophylaxis: SCD Code Status: Full Family Communication: No family at bedside Disposition Plan:  Pending clinical course, likely discharge home however given blood culture results as above patient will likely need prolonged hospital stay with IV antibiotics given high risk for PICC line abuse at home in the setting of IV drug abuse history.  Consults called: Orthopedics Admission status: Inpatient, continues to require IV antibiotics, possible further surgical evaluation and intervention.  Subjective: Denies chest pain, shortness of breath, nausea, vomiting, diarrhea, constipation, headache, fever, chills  Objective: Vitals:   04/30/19 0528 04/30/19 1631 04/30/19 2111 05/01/19 0533  BP:  110/78 121/88 113/81 108/69  Pulse: 64 74 (!) 57 (!) 58  Resp: 18 18 18 18   Temp: 98.3 F (36.8 C) (!) 97.5 F (36.4 C) 98.2 F (36.8 C) 97.8 F (36.6 C)  TempSrc: Oral Oral Oral Oral  SpO2: 100% 100% 100% 97%  Weight:      Height:        Intake/Output Summary (Last 24 hours) at 05/01/2019 0810 Last data filed at 05/01/2019 0533 Gross per 24 hour  Intake 710 ml  Output 203 ml  Net 507 ml   Filed Weights   04/27/19 2257 04/27/19 2318  Weight: 75 kg 77.1 kg    Examination:  General exam: Appears calm and comfortable  Respiratory system: Clear to auscultation. Respiratory effort normal. Cardiovascular system: S1 & S2 heard, RRR. No JVD, murmurs, rubs, gallops or clicks. No pedal edema. Gastrointestinal system: Abdomen is nondistended, soft and nontender. No organomegaly or masses felt. Normal bowel sounds heard. Central nervous system: Alert and oriented. No focal neurological deficits. Extremities: Right upper extremity bandage clean dry intact, follow wound care/surgery note for postop wound description Skin: No rashes, lesions or ulcers other than above  Data Reviewed: I have personally reviewed following labs and imaging studies  CBC: Recent Labs  Lab 04/27/19 2253 04/28/19 0411 04/29/19 0800  WBC 16.0* 14.6* 9.5  NEUTROABS 12.0*  --   --   HGB 12.4* 10.8* 11.2*  HCT 39.5 34.9* 35.4*  MCV 84.2 84.5 84.7  PLT 227 152 71*   Basic Metabolic Panel: Recent Labs  Lab 04/27/19 2253 04/28/19 0411 04/29/19 0800  NA 133* 134* 137  K 4.1 3.9 4.2  CL 100 103 108  CO2 24 23 19*  GLUCOSE 206* 137* 261*  BUN 11 9 8   CREATININE 1.03 0.97 0.79  CALCIUM 8.5* 7.7* 8.3*   GFR: Estimated Creatinine Clearance: 141.9 mL/min (by C-G formula based on SCr of 0.79 mg/dL). Liver Function Tests: Recent Labs  Lab 04/27/19 2253 04/28/19 0411 04/29/19 0800  AST 184* 128* 103*  ALT 325* 253* 210*  ALKPHOS 162* 128* 130*  BILITOT 1.6* 1.6* 0.8  PROT 6.5 5.5* 5.7*  ALBUMIN  3.3* 2.7* 2.5*   Coagulation Profile: Recent Labs  Lab 04/27/19 2253  INR 1.1   Sepsis Labs: Recent Labs  Lab 04/27/19 2253 04/28/19 0411  LATICACIDVEN 1.6 1.1    Radiology Studies: No results found.  Scheduled Meds: . Chlorhexidine Gluconate Cloth  6 each Topical Q0600  . cloNIDine  0.1 mg Oral BID   Followed by  . [START ON 05/02/2019] cloNIDine  0.1 mg Oral Daily  . docusate sodium  100 mg Oral BID  . gabapentin  300 mg Oral TID  . mupirocin ointment  1 application Nasal BID   Continuous Infusions: . sodium chloride 100 mL/hr at 05/01/19 0047  . [START ON 05/02/2019] anidulafungin    . anidulafungin 200 mg (05/01/19 0752)  . methocarbamol (ROBAXIN) IV    . vancomycin      LOS: 3 days   Time spent: 35min  Azucena FallenWilliam C Avrey Flanagin, DO [facility]  If 7PM-7AM, please contact night-coverage [email or web address] Password [email or web address] 05/01/2019, 8:10 AM

## 2019-05-01 NOTE — Progress Notes (Signed)
[facility]   Reason for visit: Follow up on MRSA sepsis with RT forearm abscess  Antibiotics: Vancomycin, day 5 Anidulafungin, day 1  Interval History: 1 of pt's initial blood cxs from admission became positive for Candida glabrata overnight. He has now been started on anidulafungin. C/o RT arm pain and swelling still but appetite and ambulation have returned to normal. Discussed significance of new blood cx results with him and need for TEE. He expressed understanding. Fever curves, WBC trends, cx results, ABX usage, imaging, and OP notes all independently reviewed    Current Facility-Administered Medications:    0.9 %  sodium chloride infusion, , Intravenous, Continuous, Martensen, Lucretia KernHenry Calvin III, PA-C, Last Rate: 100 mL/hr at 05/01/19 0047   acetaminophen (TYLENOL) tablet 325-650 mg, 325-650 mg, Oral, Q6H PRN, Albina BilletMartensen, Henry Calvin III, PA-C, 650 mg at 05/01/19 0046   [START ON 05/02/2019] anidulafungin (ERAXIS) 100 mg in sodium chloride 0.9 % 100 mL IVPB, 100 mg, Intravenous, Q24H, Ledford, Shylin Keizer L, RPH   anidulafungin (ERAXIS) 200 mg in sodium chloride 0.9 % 200 mL IVPB, 200 mg, Intravenous, Once, Stevphen RochesterLedford, Tynslee Bowlds L, [facility], Last Rate: 78 mL/hr at 05/01/19 0752, 200 mg at 05/01/19 [id number]   Chlorhexidine Gluconate Cloth 2 % PADS 6 each, 6 each, Topical, Q0600, Albina BilletMartensen, Henry Calvin III, PA-C, 6 each at 04/30/19 0507   cloNIDine (CATAPRES) tablet 0.1 mg, 0.1 mg, Oral, QID, 0.1 mg at 04/29/19 1835 **FOLLOWED BY** cloNIDine (CATAPRES) tablet 0.1 mg, 0.1 mg, Oral, BID, 0.1 mg at 04/30/19 2223 **FOLLOWED BY** [START ON 05/02/2019] cloNIDine (CATAPRES) tablet 0.1 mg, 0.1 mg, Oral, Daily, Martensen, Lucretia KernHenry Calvin III, PA-C   diphenhydrAMINE (BENADRYL) 12.5 MG/5ML elixir 12.5-25 mg, 12.5-25 mg, Oral, Q4H PRN, Martensen, Lucretia KernHenry Calvin III, PA-C   docusate sodium (COLACE) capsule 100 mg, 100 mg, Oral, BID, Martensen, Lucretia KernHenry Calvin III, PA-C, 100 mg at 04/30/19 2223    gabapentin (NEURONTIN) capsule 300 mg, 300 mg, Oral, TID, Martensen, Lucretia KernHenry Calvin III, PA-C, 300 mg at 04/30/19 2225   ketorolac (TORADOL) 30 MG/ML injection 30 mg, 30 mg, Intravenous, Q6H PRN, Albina BilletMartensen, Henry Calvin III, PA-C, 30 mg at 04/30/19 1802   methocarbamol (ROBAXIN) tablet 500 mg, 500 mg, Oral, Q6H PRN, 500 mg at 05/01/19 0045 **OR** methocarbamol (ROBAXIN) 500 mg in dextrose 5 % 50 mL IVPB, 500 mg, Intravenous, Q6H PRN, Martensen, Lucretia KernHenry Calvin III, PA-C   mupirocin ointment (BACTROBAN) 2 % 1 application, 1 application, Nasal, BID, Martensen, Lucretia KernHenry Calvin III, PA-C, 1 application at 04/30/19 2223   ondansetron (ZOFRAN) tablet 4 mg, 4 mg, Oral, Q6H PRN **OR** ondansetron (ZOFRAN) injection 4 mg, 4 mg, Intravenous, Q6H PRN, Martensen, Lucretia KernHenry Calvin III, PA-C   oxyCODONE (Oxy IR/ROXICODONE) immediate release tablet 5-10 mg, 5-10 mg, Oral, Q6H PRN, Albina BilletMartensen, Henry Calvin III, PA-C, 10 mg at 05/01/19 0805   polyethylene glycol (MIRALAX / GLYCOLAX) packet 17 g, 17 g, Oral, Daily PRN, Martensen, Lucretia KernHenry Calvin III, PA-C   vancomycin (VANCOCIN) 1,500 mg in sodium chloride 0.9 % 500 mL IVPB, 1,500 mg, Intravenous, Q12H, Della GooSinclair, Emily S, [facility]   Physical Exam:   Vitals:   04/30/19 2111 05/01/19 0533  BP: 113/81 108/69  Pulse: (!) 57 (!) 58  Resp: 18 18  Temp: 98.2 F (36.8 C) 97.8 F (36.6 C)  SpO2: 100% 97%   Physical Exam Gen: anxious, mild distress secondary to RT arm pain, A&Ox 3, more withdrawn today Head: NCAT, no temporal wasting evident EENT: PERRL, EOMI, MMM, adequate dentition  Neck: supple, no JVD CV: NRRR, no murmurs appreciated Pulm: CTA bilaterally, mild expiratory wheeze, no retractions Abd: soft, NTND, +BS (hypoactive) Extrems: 1+ RT arm/hand edema, no LE edema, 2+ pulses Skin: multiple tattoos, adequate skin turgor MSK: RT forearm and upper arm covered with compressive bandaging (no drain intact) Neuro: CN II-XII grossly intact, no focal neurologic deficits  appreciated, gait was not assessed, A&Ox 3   Review of Systems:  Review of Systems  Constitutional: Negative for chills, fever and weight loss.  HENT: Negative for congestion, hearing loss, sinus pain and sore throat.   Eyes: Negative for blurred vision, photophobia and discharge.  Respiratory: Negative for cough, hemoptysis and shortness of breath.   Cardiovascular: Negative for chest pain, palpitations, orthopnea and leg swelling.  Gastrointestinal: Negative for abdominal pain, constipation, diarrhea, heartburn, nausea and vomiting.  Genitourinary: Negative for dysuria, flank pain, frequency and urgency.  Musculoskeletal: Positive for joint pain and myalgias. Negative for back pain.       RT arm/elbow  Skin: Negative for itching and rash.  Neurological: Negative for tremors, seizures, weakness and headaches.  Endo/Heme/Allergies: Negative for polydipsia. Does not bruise/bleed easily.  Psychiatric/Behavioral: Positive for substance abuse. Negative for depression. The patient is nervous/anxious. The patient does not have insomnia.      Lab Results  Component Value Date   WBC 6.4 05/01/2019   HGB 11.9 (L) 05/01/2019   HCT 38.3 (L) 05/01/2019   MCV 86.3 05/01/2019   PLT 165 05/01/2019    Lab Results  Component Value Date   CREATININE 0.79 04/29/2019   BUN 8 04/29/2019   NA 137 04/29/2019   K 4.2 04/29/2019   CL 108 04/29/2019   CO2 19 (L) 04/29/2019    Lab Results  Component Value Date   ALT 210 (H) 04/29/2019   AST 103 (H) 04/29/2019   ALKPHOS 130 (H) 04/29/2019     Microbiology: Recent Results (from the past 240 hour(s))  Culture, blood (Routine x 2)     Status: Abnormal   Collection Time: 04/27/19 10:45 PM   Specimen: BLOOD RIGHT ARM  Result Value Ref Range Status   Specimen Description BLOOD RIGHT ARM  Final   Special Requests   Final    BOTTLES DRAWN AEROBIC AND ANAEROBIC Blood Culture adequate volume   Culture  Setup Time   Final    GRAM POSITIVE  COCCI AEROBIC BOTTLE ONLY CRITICAL RESULT CALLED TO, READ BACK BY AND VERIFIED WITH: L. POWELL, PHARMD AT 1935 ON 04/28/19 BY C. JESSUP, MT. YEAST ANAEROBIC BOTTLE ONLY CRITICAL RESULT CALLED TO, READ BACK BY AND VERIFIED WITH: J. LEDFORD,PHARMD 1610 05/01/2019 Girtha Hake Performed at [facility], [address] [place], [place] [address]    Culture METHICILLIN RESISTANT STAPHYLOCOCCUS AUREUS (A)  Final   Report Status 04/30/2019 FINAL  Final   Organism ID, Bacteria METHICILLIN RESISTANT STAPHYLOCOCCUS AUREUS  Final      Susceptibility   Methicillin resistant staphylococcus aureus - MIC*    CIPROFLOXACIN <=0.5 SENSITIVE Sensitive     ERYTHROMYCIN >=8 RESISTANT Resistant     GENTAMICIN <=0.5 SENSITIVE Sensitive     OXACILLIN >=4 RESISTANT Resistant     TETRACYCLINE <=1 SENSITIVE Sensitive     VANCOMYCIN <=0.5 SENSITIVE Sensitive     TRIMETH/SULFA <=10 SENSITIVE Sensitive     CLINDAMYCIN <=0.25 SENSITIVE Sensitive     RIFAMPIN <=0.5 SENSITIVE Sensitive     Inducible Clindamycin NEGATIVE Sensitive     * METHICILLIN RESISTANT STAPHYLOCOCCUS AUREUS  Blood Culture  ID Panel (Reflexed)     Status: Abnormal   Collection Time: 04/27/19 10:45 PM  Result Value Ref Range Status   Enterococcus species NOT DETECTED NOT DETECTED Final   Listeria monocytogenes NOT DETECTED NOT DETECTED Final   Staphylococcus species DETECTED (A) NOT DETECTED Final    Comment: CRITICAL RESULT CALLED TO, READ BACK BY AND VERIFIED WITH: L. POWELL, PHARMD AT 1935 ON 04/28/19 BY C. JESSUP, MT.    Staphylococcus aureus (BCID) DETECTED (A) NOT DETECTED Final    Comment: Methicillin (oxacillin)-resistant Staphylococcus aureus (MRSA). MRSA is predictably resistant to beta-lactam antibiotics (except ceftaroline). Preferred therapy is vancomycin unless clinically contraindicated. Patient requires contact precautions if  hospitalized. CRITICAL RESULT CALLED TO, READ BACK BY AND VERIFIED WITH: L. POWELL, PHARMD AT 1935 ON  04/28/19 BY C. JESSUP, MT.    Methicillin resistance DETECTED (A) NOT DETECTED Final    Comment: CRITICAL RESULT CALLED TO, READ BACK BY AND VERIFIED WITH: L. POWELL, PHARMD AT 1935 ON 04/28/19 BY C. JESSUP, MT.    Streptococcus species NOT DETECTED NOT DETECTED Final   Streptococcus agalactiae NOT DETECTED NOT DETECTED Final   Streptococcus pneumoniae NOT DETECTED NOT DETECTED Final   Streptococcus pyogenes NOT DETECTED NOT DETECTED Final   Acinetobacter baumannii NOT DETECTED NOT DETECTED Final   Enterobacteriaceae species NOT DETECTED NOT DETECTED Final   Enterobacter cloacae complex NOT DETECTED NOT DETECTED Final   Escherichia coli NOT DETECTED NOT DETECTED Final   Klebsiella oxytoca NOT DETECTED NOT DETECTED Final   Klebsiella pneumoniae NOT DETECTED NOT DETECTED Final   Proteus species NOT DETECTED NOT DETECTED Final   Serratia marcescens NOT DETECTED NOT DETECTED Final   Haemophilus influenzae NOT DETECTED NOT DETECTED Final   Neisseria meningitidis NOT DETECTED NOT DETECTED Final   Pseudomonas aeruginosa NOT DETECTED NOT DETECTED Final   Candida albicans NOT DETECTED NOT DETECTED Final   Candida glabrata NOT DETECTED NOT DETECTED Final   Candida krusei NOT DETECTED NOT DETECTED Final   Candida parapsilosis NOT DETECTED NOT DETECTED Final   Candida tropicalis NOT DETECTED NOT DETECTED Final    Comment: Performed at [facility], [place] [place], [place] [address]  Culture, blood (Routine x 2)     Status: None (Preliminary result)   Collection Time: 04/27/19 10:55 PM   Specimen: BLOOD  Result Value Ref Range Status   Specimen Description BLOOD RIGHT HAND  Final   Special Requests   Final    BOTTLES DRAWN AEROBIC ONLY Blood Culture results may not be optimal due to an excessive volume of blood received in culture bottles   Culture  Setup Time   Final    YEAST AEROBIC BOTTLE ONLY CRITICAL RESULT CALLED TO, READ BACK BY AND VERIFIED WITH: J. LEDFORD,PHARMD [id number]  05/01/2019 Mena Goes Performed at [facility], [place] [address], [place], [place] [address]    Culture YEAST  Final   Report Status PENDING  Incomplete  Blood Culture ID Panel (Reflexed)     Status: Abnormal   Collection Time: 04/27/19 10:55 PM  Result Value Ref Range Status   Enterococcus species NOT DETECTED NOT DETECTED Final   Listeria monocytogenes NOT DETECTED NOT DETECTED Final   Staphylococcus species NOT DETECTED NOT DETECTED Final   Staphylococcus aureus (BCID) NOT DETECTED NOT DETECTED Final   Streptococcus species NOT DETECTED NOT DETECTED Final   Streptococcus agalactiae NOT DETECTED NOT DETECTED Final   Streptococcus pneumoniae NOT DETECTED NOT DETECTED Final   Streptococcus pyogenes NOT DETECTED  NOT DETECTED Final   Acinetobacter baumannii NOT DETECTED NOT DETECTED Final   Enterobacteriaceae species NOT DETECTED NOT DETECTED Final   Enterobacter cloacae complex NOT DETECTED NOT DETECTED Final   Escherichia coli NOT DETECTED NOT DETECTED Final   Klebsiella oxytoca NOT DETECTED NOT DETECTED Final   Klebsiella pneumoniae NOT DETECTED NOT DETECTED Final   Proteus species NOT DETECTED NOT DETECTED Final   Serratia marcescens NOT DETECTED NOT DETECTED Final   Haemophilus influenzae NOT DETECTED NOT DETECTED Final   Neisseria meningitidis NOT DETECTED NOT DETECTED Final   Pseudomonas aeruginosa NOT DETECTED NOT DETECTED Final   Candida albicans NOT DETECTED NOT DETECTED Final   Candida glabrata DETECTED (A) NOT DETECTED Final    Comment: CRITICAL RESULT CALLED TO, READ BACK BY AND VERIFIED WITH: J. LEDFORD,PHARMD 1779 05/01/2019 T. TYSOR    Candida krusei NOT DETECTED NOT DETECTED Final   Candida parapsilosis NOT DETECTED NOT DETECTED Final   Candida tropicalis NOT DETECTED NOT DETECTED Final    Comment: Performed at [facility], [address] [place], [place] [address]  SARS CORONAVIRUS 2 (TAT 6-12 HRS) Nasal Swab Aptima Multi Swab     Status: None    Collection Time: 04/28/19  3:21 AM   Specimen: Aptima Multi Swab; Nasal Swab  Result Value Ref Range Status   SARS Coronavirus 2 NEGATIVE NEGATIVE Final    Comment: (NOTE) SARS-CoV-2 target nucleic acids are NOT DETECTED. The SARS-CoV-2 RNA is generally detectable in upper and lower respiratory specimens during the acute phase of infection. Negative results do not preclude SARS-CoV-2 infection, do not rule out co-infections with other pathogens, and should not be used as the sole basis for treatment or other patient management decisions. Negative results must be combined with clinical observations, patient history, and epidemiological information. The expected result is Negative. Fact Sheet for Patients: HairSlick.no Fact Sheet for Healthcare Providers: quierodirigir.com This test is not yet approved or cleared by the [place] FDA and  has been authorized for detection and/or diagnosis of SARS-CoV-2 by FDA under an Emergency Use Authorization (EUA). This EUA will remain  in effect (meaning this test can be used) for the duration of the COVID-19 declaration under Section 56 4(b)(1) of the Act, 21 U.S.C. section 360bbb-3(b)(1), unless the authorization is terminated or revoked sooner. Performed at [facility], [address] [place], [place] [address]   SARS Coronavirus 2 [facility] order, Performed in [facility] hospital lab) Nasopharyngeal Nasopharyngeal Swab     Status: None   Collection Time: 04/28/19  3:14 PM   Specimen: Nasopharyngeal Swab  Result Value Ref Range Status   SARS Coronavirus 2 NEGATIVE NEGATIVE Final    Comment: (NOTE) If result is NEGATIVE SARS-CoV-2 target nucleic acids are NOT DETECTED. The SARS-CoV-2 RNA is generally detectable in upper and lower  respiratory specimens during the acute phase of infection. The lowest  concentration of SARS-CoV-2 viral copies this assay can detect is 250    copies / mL. A negative result does not preclude SARS-CoV-2 infection  and should not be used as the sole basis for treatment or other  patient management decisions.  A negative result may occur with  improper specimen collection / handling, submission of specimen other  than nasopharyngeal swab, presence of viral mutation(s) within the  areas targeted by this assay, and inadequate number of viral copies  (<250 copies / mL). A negative result must be combined with clinical  observations, patient history, and epidemiological information. If result  is POSITIVE SARS-CoV-2 target nucleic acids are DETECTED. The SARS-CoV-2 RNA is generally detectable in upper and lower  respiratory specimens dur ing the acute phase of infection.  Positive  results are indicative of active infection with SARS-CoV-2.  Clinical  correlation with patient history and other diagnostic information is  necessary to determine patient infection status.  Positive results do  not rule out bacterial infection or co-infection with other viruses. If result is PRESUMPTIVE POSTIVE SARS-CoV-2 nucleic acids MAY BE PRESENT.   A presumptive positive result was obtained on the submitted specimen  and confirmed on repeat testing.  While 2019 novel coronavirus  (SARS-CoV-2) nucleic acids may be present in the submitted sample  additional confirmatory testing may be necessary for epidemiological  and / or clinical management purposes  to differentiate between  SARS-CoV-2 and other Sarbecovirus currently known to infect humans.  If clinically indicated additional testing with an alternate test  methodology [id number]) is advised. The SARS-CoV-2 RNA is generally  detectable in upper and lower respiratory sp ecimens during the acute  phase of infection. The expected result is Negative. Fact Sheet for Patients:  BoilerBrush.com.cy Fact Sheet for Healthcare  Providers: https://pope.com/ This test is not yet approved or cleared by the [place] FDA and has been authorized for detection and/or diagnosis of SARS-CoV-2 by FDA under an Emergency Use Authorization (EUA).  This EUA will remain in effect (meaning this test can be used) for the duration of the COVID-19 declaration under Section 564(b)(1) of the Act, 21 U.S.C. section 360bbb-3(b)(1), unless the authorization is terminated or revoked sooner. Performed at [facility], [address] [place], [place] [address]   MRSA PCR Screening     Status: Abnormal   Collection Time: 04/28/19  4:21 PM   Specimen: Nasal Mucosa; Nasopharyngeal  Result Value Ref Range Status   MRSA by PCR POSITIVE (A) NEGATIVE Final    Comment:        The GeneXpert MRSA Assay (FDA approved for NASAL specimens only), is one component of a comprehensive MRSA colonization surveillance program. It is not intended to diagnose MRSA infection nor to guide or monitor treatment for MRSA infections. CRITICAL RESULT CALLED TO, READ BACK BY AND VERIFIED WITH: RN [facility] IRISH [phone] FCP Performed at [facility], [address], [place], [place] [address]   Aerobic/Anaerobic Culture (surgical/deep wound)     Status: None (Preliminary result)   Collection Time: 04/28/19  8:08 PM   Specimen: Abscess  Result Value Ref Range Status   Specimen Description ABSCESS RIGHT FOREARM  Final   Special Requests PATIENT ON FOLLOWING VANCOMYCIN,ANCEF  Final   Gram Stain   Final    MODERATE WBC PRESENT, PREDOMINANTLY PMN FEW GRAM POSITIVE COCCI Performed at [facility], [address] [place], [place] [address]    Culture   Final    ABUNDANT METHICILLIN RESISTANT STAPHYLOCOCCUS AUREUS   Report Status PENDING  Incomplete   Organism ID, Bacteria METHICILLIN RESISTANT STAPHYLOCOCCUS AUREUS  Final      Susceptibility   Methicillin resistant staphylococcus aureus - MIC*     CIPROFLOXACIN <=0.5 SENSITIVE Sensitive     ERYTHROMYCIN >=8 RESISTANT Resistant     GENTAMICIN <=0.5 SENSITIVE Sensitive     OXACILLIN >=4 RESISTANT Resistant     TETRACYCLINE <=1 SENSITIVE Sensitive     VANCOMYCIN <=0.5 SENSITIVE Sensitive     TRIMETH/SULFA <=10 SENSITIVE Sensitive     CLINDAMYCIN >=8 RESISTANT Resistant     RIFAMPIN <=  0.5 SENSITIVE Sensitive     Inducible Clindamycin NEGATIVE Sensitive     * ABUNDANT METHICILLIN RESISTANT STAPHYLOCOCCUS AUREUS  Culture, blood (routine x 2)     Status: None (Preliminary result)   Collection Time: 04/29/19 12:20 PM   Specimen: BLOOD LEFT HAND  Result Value Ref Range Status   Specimen Description BLOOD LEFT HAND  Final   Special Requests   Final    BOTTLES DRAWN AEROBIC ONLY Blood Culture adequate volume   Culture   Final    NO GROWTH < 24 HOURS Performed at [facility], [address] [place], [place] [address]    Report Status PENDING  Incomplete  Culture, blood (routine x 2)     Status: None (Preliminary result)   Collection Time: 04/29/19 12:25 PM   Specimen: BLOOD  Result Value Ref Range Status   Specimen Description BLOOD LEFT ANTECUBITAL  Final   Special Requests   Final    BOTTLES DRAWN AEROBIC ONLY Blood Culture adequate volume   Culture   Final    NO GROWTH < 24 HOURS Performed at [facility] [place] [facility], [address] [place], [place] [address]    Report Status PENDING  Incomplete    Impression/Plan: Patient is a 33 year old white male IV drug user admitted for MRSA sepsis/fungemia and right forearm abscess, status post debridement.  1.  MRSA sepsis- patient's initial blood cultures were positive for MRSA.  Continue vancomycin dosed for goal trough of 15-20 for now.  TTE did not show evidence of endocarditis and repeat blood cultures thus far are showing no growth.  See comments below re: TEE. If he continues to show improvement with his right forearm abscess and fungemia is simple (without IE), can likely limit  antibiotic treatment to 3-4 weeks total.  As he now will require an IV antifungal, there is likely limited benefit in pursuing transition to PO zyvox.  His platelet count did rebound on today's labs.  2. Fungemia - New growth of Candida glabrata noted overnight, so anidulafungin load started this AM. Will repeat blood cxs x 2 today to assess for clearance. As he now has 2 high risk pathogen noted for endocarditis, I would proceed to TEE today to assist in determining duration of antimicrobials +/- need for cardiac surgery intervention. Unfortunately, Candida glabrata is often resistant to azoles, so he anticipate IV antifungals as an inpt now.  3. RT forearm abscess -appreciate orthopedics debriding the patient's wound on April 28, 2019.  Will need direct visualization of his wound as noted above in order to determine proper duration of antibiotics.  If significant improvement is confirmed, will likely limit vanc arm of treatment to 3 weeks in total from the time of his right forearm debridement, provided his TEE is also unrevealing.  4.  IV drug use- HIV, hep B and hep C serologies were all negative in June 2020.  Consider repeating these labs as the patient's IV drug use has been ongoing.  I counseled the patient extensively regarding the relationship between his current infection and his ongoing IV drug use particularly with reuse of needles for multiple injections. Pt had visitor yesterday that was actively under the influence of illicit substances. Nursing reports concern that he was smoking in his room overnight as well. Monitor closely for further illicit substance use as an inpt given these issues.  Nursing staff was made aware and may need to limit visitors should there be any persistent concerns moving forward.

## 2019-05-01 NOTE — Progress Notes (Signed)
PHARMACY - PHYSICIAN COMMUNICATION CRITICAL VALUE ALERT - BLOOD CULTURE IDENTIFICATION (BCID)  Jon Castro is an 33 y.o. male who presented to [facility] on 04/27/2019 with a chief complaint of MRSA bacteremia, abscess, cellulitis.   Name of physician (or Provider) Contacted: [facility] ([facility])  Current antibiotics: Vancomycin   Changes to prescribed antibiotics recommended:  Continue Vancomycin for MRSA bacteremia  Start Eraxis for Candida Glabrata fungemia   Results for orders placed or performed during the hospital encounter of 04/27/19  Blood Culture ID Panel (Reflexed) (Collected: 04/27/2019 10:55 PM)  Result Value Ref Range   Enterococcus species NOT DETECTED NOT DETECTED   Listeria monocytogenes NOT DETECTED NOT DETECTED   Staphylococcus species NOT DETECTED NOT DETECTED   Staphylococcus aureus (BCID) NOT DETECTED NOT DETECTED   Streptococcus species NOT DETECTED NOT DETECTED   Streptococcus agalactiae NOT DETECTED NOT DETECTED   Streptococcus pneumoniae NOT DETECTED NOT DETECTED   Streptococcus pyogenes NOT DETECTED NOT DETECTED   Acinetobacter baumannii NOT DETECTED NOT DETECTED   Enterobacteriaceae species NOT DETECTED NOT DETECTED   Enterobacter cloacae complex NOT DETECTED NOT DETECTED   Escherichia coli NOT DETECTED NOT DETECTED   Klebsiella oxytoca NOT DETECTED NOT DETECTED   Klebsiella pneumoniae NOT DETECTED NOT DETECTED   Proteus species NOT DETECTED NOT DETECTED   Serratia marcescens NOT DETECTED NOT DETECTED   Haemophilus influenzae NOT DETECTED NOT DETECTED   Neisseria meningitidis NOT DETECTED NOT DETECTED   Pseudomonas aeruginosa NOT DETECTED NOT DETECTED   Candida albicans NOT DETECTED NOT DETECTED   Candida glabrata DETECTED (A) NOT DETECTED   Candida krusei NOT DETECTED NOT DETECTED   Candida parapsilosis NOT DETECTED NOT DETECTED   Candida tropicalis NOT DETECTED NOT DETECTED    Narda Bonds 05/01/2019  6:09 AM

## 2019-05-02 DIAGNOSIS — B49 Unspecified mycosis: Secondary | ICD-10-CM

## 2019-05-02 DIAGNOSIS — B379 Candidiasis, unspecified: Secondary | ICD-10-CM

## 2019-05-02 HISTORY — DX: Unspecified mycosis: B49

## 2019-05-02 LAB — COMPREHENSIVE METABOLIC PANEL
ALT: 156 U/L — ABNORMAL HIGH (ref 0–44)
AST: 107 U/L — ABNORMAL HIGH (ref 15–41)
Albumin: 2.3 g/dL — ABNORMAL LOW (ref 3.5–5.0)
Alkaline Phosphatase: 138 U/L — ABNORMAL HIGH (ref 38–126)
Anion gap: 9 (ref 5–15)
BUN: 9 mg/dL (ref 6–20)
CO2: 24 mmol/L (ref 22–32)
Calcium: 8.1 mg/dL — ABNORMAL LOW (ref 8.9–10.3)
Chloride: 104 mmol/L (ref 98–111)
Creatinine, Ser: 0.81 mg/dL (ref 0.61–1.24)
GFR calc Af Amer: >60 mL/min (ref >60)
GFR calc non Af Amer: >60 mL/min (ref >60)
Glucose, Bld: 97 mg/dL (ref 70–99)
Potassium: 3.9 mmol/L (ref 3.5–5.1)
Sodium: 137 mmol/L (ref 135–145)
Total Bilirubin: 0.5 mg/dL (ref 0.3–1.2)
Total Protein: 5.6 g/dL — ABNORMAL LOW (ref 6.5–8.1)

## 2019-05-02 LAB — CBC
HCT: 33.1 % — ABNORMAL LOW (ref 39.0–52.0)
Hemoglobin: 10.5 g/dL — ABNORMAL LOW (ref 13.0–17.0)
MCH: 26.8 pg (ref 26.0–34.0)
MCHC: 31.7 g/dL (ref 30.0–36.0)
MCV: 84.4 fL (ref 80.0–100.0)
Platelets: 210 10*3/uL (ref 150–400)
RBC: 3.92 MIL/uL — ABNORMAL LOW (ref 4.22–5.81)
RDW: 15.3 % (ref 11.5–15.5)
WBC: 7.4 10*3/uL (ref 4.0–10.5)
nRBC: 0 % (ref 0.0–0.2)

## 2019-05-02 NOTE — Progress Notes (Signed)
    [facility] has been requested to perform a transesophageal echocardiogram on Jon Castro for bacteremia.  After careful review of history and examination, the risks and benefits of transesophageal echocardiogram have been explained including risks of esophageal damage, perforation (1:10,000 risk), bleeding, pharyngeal hematoma as well as other potential complications associated with conscious sedation including aspiration, arrhythmia, respiratory failure and death. Alternatives to treatment were discussed, questions were answered. Patient is willing to proceed.   Braelin Costlow Ninfa Meeker, PA-C  05/02/2019 2:22 PM

## 2019-05-02 NOTE — Progress Notes (Signed)
Patient ID: Jon Castro, male   DOB: [birth year], 33 y.o.   MRN: [id number]    LOS: 4 days   Subjective: Doing well from arm standpoint, still quite painful however.   Objective: Vital signs in last 24 hours: Temp:  [98.1 F (36.7 C)-98.4 F (36.9 C)] 98.1 F (36.7 C) (09/03 0547) Pulse Rate:  [56-68] 68 (09/03 0547) Resp:  [18-20] 18 (09/03 0547) BP: (119-124)/(82-87) 124/85 (09/03 0547) SpO2:  [97 %-100 %] 99 % (09/03 0547) Last BM Date: 04/30/19   Laboratory  CBC Recent Labs    05/01/19 0654 05/02/19 0450  WBC 6.4 7.4  HGB 11.9* 10.5*  HCT 38.3* 33.1*  PLT 165 210   BMET Recent Labs    05/01/19 1035 05/02/19 0450  NA 139 137  K 3.6 3.9  CL 108 104  CO2 22 24  GLUCOSE 95 97  BUN 9 9  CREATININE 0.93 0.81  CALCIUM 8.0* 8.1*     Physical Exam General appearance: alert and no distress  RUE: Incisions C/D/I, no purulence, erythema, or discharge       Assessment/Plan: Right forearm/elbow abscesses s/p I&D -- Doing very well from this standpoint. F/u with Dr. Percell Miller as OP.    Lisette Abu, PA-C Orthopedic Surgery [phone] 05/02/2019

## 2019-05-02 NOTE — TOC Initial Note (Signed)
Transition of Care [facility]) - Initial/Assessment Note    Patient Details  Name: Blayn Whetsell MRN: [id number] Date of Birth: Jul 02, 1986  Transition of Care [facility]) CM/SW Contact:    Marilu Favre, RN Phone Number: 05/02/2019, 10:51 AM  Clinical Narrative:                 Patient confirmed face sheet information. Patient from home alone, provided [employer] [facility] information. Will follow for determination of ABX   Expected Discharge Plan: Home/Self Care Barriers to Discharge: Continued Medical Work up   Patient Goals and CMS Choice Patient states their goals for this hospitalization and ongoing recovery are:: to go home CMS Medicare.gov Compare Post Acute Care list provided to:: Patient Choice offered to / list presented to : NA  Expected Discharge Plan and Services Expected Discharge Plan: Home/Self Care In-house Referral: Financial Counselor Discharge Planning Services: CM Consult   Living arrangements for the past 2 months: Single Family Home                 DME Arranged: N/A         HH Arranged: NA          Prior Living Arrangements/Services Living arrangements for the past 2 months: Single Family Home Lives with:: Self Patient language and need for interpreter reviewed:: Yes Do you feel safe going back to the place where you live?: Yes      Need for Family Participation in Patient Care: No (Comment) Care giver support system in place?: Yes (comment)   Criminal Activity/Legal Involvement Pertinent to Current Situation/Hospitalization: No - Comment as needed  Activities of Daily Living Home Assistive Devices/Equipment: None ADL Screening (condition at time of admission) Patient's cognitive ability adequate to safely complete daily activities?: Yes Is the patient deaf or have difficulty hearing?: No Does the patient have difficulty seeing, even when wearing glasses/contacts?: No Does the patient have difficulty concentrating, remembering, or  making decisions?: No Patient able to express need for assistance with ADLs?: Yes Does the patient have difficulty dressing or bathing?: No Independently performs ADLs?: Yes (appropriate for developmental age) Does the patient have difficulty walking or climbing stairs?: No Weakness of Legs: None Weakness of Arms/Hands: Right  Permission Sought/Granted   Permission granted to share information with : No              Emotional Assessment Appearance:: Appears stated age Attitude/Demeanor/Rapport: Engaged Affect (typically observed): Accepting Orientation: : Oriented to Self, Oriented to Place, Oriented to  Time, Oriented to Situation   Psych Involvement: No (comment)  Admission diagnosis:  Abscess of right forearm [L02.413] IV drug abuse ([place]) [F19.10] Patient Active Problem List   Diagnosis Date Noted  . Fungemia 05/02/2019  . Blood culture positive for Candida species 05/02/2019  . MRSA bacteremia 04/29/2019  . IVDU (intravenous drug user) 04/29/2019  . Abscess of right forearm 04/28/2019  . Hyperglycemia 04/28/2019  . Leucocytosis 04/28/2019  . LFTs abnormal 04/28/2019  . Opioid dependence with opioid-induced mood disorder ([place])   . MDD (major depressive disorder), severe ([place]) 02/07/2019   PCP:  Patient, [name] Per Pharmacy:   [id number] - [place], [place] [address] [place] [address] Phone: [phone] Fax: [phone]  [place] ([place]), [place] - [address] [place] ([place]) [place] [address] Phone: [phone] Fax: [place],  [place] - [address] [place] [address] Phone: [phone] Fax: [phone]     Social Determinants of Health (SDOH) Interventions    Readmission Risk Interventions No flowsheet data found.

## 2019-05-02 NOTE — H&P (View-Only) (Signed)
PROGRESS NOTE    Jon MadeiraKevin Castro  ZOX:[id number] DOB: 11/14/1985 DOA: 04/27/2019 PCP: Patient, [name] Per   Brief Narrative:  Jon MadeiraKevin Cespedes is a 33 y.o. male with medical history significant of IV drug abuse, chronic pain syndrome, recent suicide attempt with transfer to behavioral health who is on Suboxone and apparently unable to get the clinic and get his Suboxone so he used heroin today.  Since he was released from behavioral health he has noticed increase in size of a lesion on his right forearm with fever.  Now the whole hand is swollen tender painful.  Patient was seen in the ER suspected abscess.  Initial I&D done superficially and area dressed.  He is being admitted with cellulitis.  Patient's pain is 9 out of 10 at the moment.  He attempted to shoot the heroin in the area.  No contact with anybody was covered.  No previous bacteremia or sepsis denied any MRSA infections.  Patient is being admitted for IV antibiotic and supportive care. In ED temperature 103 blood pressure 97/61 pulse 153 respiratory rate of 20 oxygen sat 95% room air.  White count 16,000 hemoglobin 12.4 and platelet 227.  Sodium 133 potassium 4.1 chloride 100 CO2 24 with BUN 11 and creatinine 1.03.  Calcium 8.5 and glucose 206 INR 1.1.  Patient given IV Zosyn with initial incision and drainage and is being admitted to the hospital for treatment   Assessment & Plan:   Principal Problem:   Abscess of right forearm Active Problems:   MDD (major depressive disorder), severe ([facility])   Opioid dependence with opioid-induced mood disorder ([facility])   Hyperglycemia   Leucocytosis   LFTs abnormal   MRSA bacteremia   IVDU (intravenous drug user)   Fungemia   Blood culture positive for Candida species   Acute sepsis in the setting of acute/subacute cellulitis and abscess of the right forearm, POA Concurrent bacteremia -MRSA with concurrent candidemia.  POA.   Secondary to IV drug use, admits to injecting in this region a few  days to a week prior to initial erythema and pain Superficial I&D in the emergency department 04/27/19 Orthopedics performed repeat deep I&D and washout on 04/28/19 Blood cultures   MRSA +8/29  Candida +8/29  Repeat blood cultures 04/29/19 preliminary negative Intraoperative culture   MRSA - resistant to clinda/erythromycin/oxacillin ID following, we appreciate insight and recommendations:  Continue IV vancomycin - 04/26/19>> ongoing  Initiate IV Anidulafungin - 05/01/19 >> ongoing TTE -was noted to be without overt vegetation TEE now recommended per ID given new fungal cultures - pending 05/03/19  Depression with anxiety history of suicidal ideation, stable:  Patient was just released from behavioral health prior to admission.   Continue home medications once verified, patient's home med rec currently is empty  Incidentally noted hyperglycemia:  No personal history of diabetes, unclear family history A1c 5.8, discontinue all point-of-care glucose checks, sliding scale insulin  Elevated LFTs, stabilizing:  Unclear etiology, concern for polypharmacy and illicit drug/substance use Repeat liver panel, improving previously but now stabilizing No abdominal pain, patient tolerating p.o. quite well Tylenol level at admission undetectable Patient declines any recent alcohol, EtOH level at admission undetectable  DVT prophylaxis: SCD Code Status: Full Family Communication: No family at bedside Disposition Plan:  Pending clinical course, likely discharge home however given blood culture results as above patient will likely need prolonged hospital stay with IV antibiotics given high risk for PICC line abuse at home in the setting of IV drug abuse history.  Consults called: Orthopedics Admission status: Inpatient, continues to require IV antibiotics, possible further surgical evaluation and intervention.  Subjective: Denies chest pain, shortness of breath, nausea, vomiting, diarrhea,  constipation, headache, fever, chills  Objective: Vitals:   05/01/19 0533 05/01/19 1436 05/01/19 2132 05/02/19 0547  BP: 108/69 119/82 121/87 124/85  Pulse: (!) 58 (!) 56 65 68  Resp: 18 20 19 18   Temp: 97.8 F (36.6 C) 98.2 F (36.8 C) 98.4 F (36.9 C) 98.1 F (36.7 C)  TempSrc: Oral Oral Oral Oral  SpO2: 97% 100% 97% 99%  Weight:      Height:        Intake/Output Summary (Last 24 hours) at 05/02/2019 0846 Last data filed at 05/02/2019 0400 Gross per 24 hour  Intake 2586.59 ml  Output -  Net 2586.59 ml   Filed Weights   04/27/19 2257 04/27/19 2318  Weight: 75 kg 77.1 kg    Examination:  General exam: Appears calm and comfortable  Respiratory system: Clear to auscultation. Respiratory effort normal. Cardiovascular system: S1 & S2 heard, RRR. No JVD, murmurs, rubs, gallops or clicks. No pedal edema. Gastrointestinal system: Abdomen is nondistended, soft and nontender. No organomegaly or masses felt. Normal bowel sounds heard. Central nervous system: Alert and oriented. No focal neurological deficits. Extremities: Right upper extremity bandage clean dry intact, follow wound care/surgery note for postop wound description Skin: No rashes, lesions or ulcers other than above  Data Reviewed: I have personally reviewed following labs and imaging studies  CBC: Recent Labs  Lab 04/27/19 2253 04/28/19 0411 04/29/19 0800 05/01/19 0654 05/02/19 0450  WBC 16.0* 14.6* 9.5 6.4 7.4  NEUTROABS 12.0*  --   --   --   --   HGB 12.4* 10.8* 11.2* 11.9* 10.5*  HCT 39.5 34.9* 35.4* 38.3* 33.1*  MCV 84.2 84.5 84.7 86.3 84.4  PLT 227 152 71* 165 210   Basic Metabolic Panel: Recent Labs  Lab 04/27/19 2253 04/28/19 0411 04/29/19 0800 05/01/19 1035 05/02/19 0450  NA 133* 134* 137 139 137  K 4.1 3.9 4.2 3.6 3.9  CL 100 103 108 108 104  CO2 24 23 19* 22 24  GLUCOSE 206* 137* 261* 95 97  BUN 11 9 8 9 9   CREATININE 1.03 0.97 0.79 0.93 0.81  CALCIUM 8.5* 7.7* 8.3* 8.0* 8.1*   GFR:  Estimated Creatinine Clearance: 140.1 mL/min (by C-G formula based on SCr of 0.81 mg/dL). Liver Function Tests: Recent Labs  Lab 04/27/19 2253 04/28/19 0411 04/29/19 0800 05/01/19 1035 05/02/19 0450  AST 184* 128* 103* 71* 107*  ALT 325* 253* 210* 143* 156*  ALKPHOS 162* 128* 130* 125 138*  BILITOT 1.6* 1.6* 0.8 0.7 0.5  PROT 6.5 5.5* 5.7* 5.7* 5.6*  ALBUMIN 3.3* 2.7* 2.5* 2.3* 2.3*   Coagulation Profile: Recent Labs  Lab 04/27/19 2253  INR 1.1   Sepsis Labs: Recent Labs  Lab 04/27/19 2253 04/28/19 0411  LATICACIDVEN 1.6 1.1    Radiology Studies: No results found.  Scheduled Meds: . Chlorhexidine Gluconate Cloth  6 each Topical Q0600  . cloNIDine  0.1 mg Oral Daily  . docusate sodium  100 mg Oral BID  . gabapentin  300 mg Oral TID  . mupirocin ointment  1 application Nasal BID   Continuous Infusions: . sodium chloride 100 mL/hr at 05/02/19 0400  . anidulafungin    . methocarbamol (ROBAXIN) IV    . vancomycin Stopped (05/02/19 0110)    LOS: 4 days   Time spent:  Little Ishikawa, DO [facility]  If 7PM-7AM, please contact night-coverage [email or web address] Password [facility] 05/02/2019, 8:46 AM

## 2019-05-02 NOTE — Progress Notes (Signed)
PROGRESS NOTE    Jon MadeiraKevin Castro  ZOX:[id number] DOB: 11/14/1985 DOA: 04/27/2019 PCP: Patient, [name] Per   Brief Narrative:  Jon MadeiraKevin Castro is a 33 y.o. male with medical history significant of IV drug abuse, chronic pain syndrome, recent suicide attempt with transfer to behavioral health who is on Suboxone and apparently unable to get the clinic and get his Suboxone so he used heroin today.  Since he was released from behavioral health he has noticed increase in size of a lesion on his right forearm with fever.  Now the whole hand is swollen tender painful.  Patient was seen in the ER suspected abscess.  Initial I&D done superficially and area dressed.  He is being admitted with cellulitis.  Patient's pain is 9 out of 10 at the moment.  He attempted to shoot the heroin in the area.  No contact with anybody was covered.  No previous bacteremia or sepsis denied any MRSA infections.  Patient is being admitted for IV antibiotic and supportive care. In ED temperature 103 blood pressure 97/61 pulse 153 respiratory rate of 20 oxygen sat 95% room air.  White count 16,000 hemoglobin 12.4 and platelet 227.  Sodium 133 potassium 4.1 chloride 100 CO2 24 with BUN 11 and creatinine 1.03.  Calcium 8.5 and glucose 206 INR 1.1.  Patient given IV Zosyn with initial incision and drainage and is being admitted to the hospital for treatment   Assessment & Plan:   Principal Problem:   Abscess of right forearm Active Problems:   MDD (major depressive disorder), severe ([facility])   Opioid dependence with opioid-induced mood disorder ([facility])   Hyperglycemia   Leucocytosis   LFTs abnormal   MRSA bacteremia   IVDU (intravenous drug user)   Fungemia   Blood culture positive for Candida species   Acute sepsis in the setting of acute/subacute cellulitis and abscess of the right forearm, POA Concurrent bacteremia -MRSA with concurrent candidemia.  POA.   Secondary to IV drug use, admits to injecting in this region a few  days to a week prior to initial erythema and pain Superficial I&D in the emergency department 04/27/19 Orthopedics performed repeat deep I&D and washout on 04/28/19 Blood cultures   MRSA +8/29  Candida +8/29  Repeat blood cultures 04/29/19 preliminary negative Intraoperative culture   MRSA - resistant to clinda/erythromycin/oxacillin ID following, we appreciate insight and recommendations:  Continue IV vancomycin - 04/26/19>> ongoing  Initiate IV Anidulafungin - 05/01/19 >> ongoing TTE -was noted to be without overt vegetation TEE now recommended per ID given new fungal cultures - pending 05/03/19  Depression with anxiety history of suicidal ideation, stable:  Patient was just released from behavioral health prior to admission.   Continue home medications once verified, patient's home med rec currently is empty  Incidentally noted hyperglycemia:  No personal history of diabetes, unclear family history A1c 5.8, discontinue all point-of-care glucose checks, sliding scale insulin  Elevated LFTs, stabilizing:  Unclear etiology, concern for polypharmacy and illicit drug/substance use Repeat liver panel, improving previously but now stabilizing No abdominal pain, patient tolerating p.o. quite well Tylenol level at admission undetectable Patient declines any recent alcohol, EtOH level at admission undetectable  DVT prophylaxis: SCD Code Status: Full Family Communication: No family at bedside Disposition Plan:  Pending clinical course, likely discharge home however given blood culture results as above patient will likely need prolonged hospital stay with IV antibiotics given high risk for PICC line abuse at home in the setting of IV drug abuse history.  Consults called: Orthopedics Admission status: Inpatient, continues to require IV antibiotics, possible further surgical evaluation and intervention.  Subjective: Denies chest pain, shortness of breath, nausea, vomiting, diarrhea,  constipation, headache, fever, chills  Objective: Vitals:   05/01/19 0533 05/01/19 1436 05/01/19 2132 05/02/19 0547  BP: 108/69 119/82 121/87 124/85  Pulse: (!) 58 (!) 56 65 68  Resp: 18 20 19 18  Temp: 97.8 F (36.6 C) 98.2 F (36.8 C) 98.4 F (36.9 C) 98.1 F (36.7 C)  TempSrc: Oral Oral Oral Oral  SpO2: 97% 100% 97% 99%  Weight:      Height:        Intake/Output Summary (Last 24 hours) at 05/02/2019 0846 Last data filed at 05/02/2019 0400 Gross per 24 hour  Intake 2586.59 ml  Output -  Net 2586.59 ml   Filed Weights   04/27/19 2257 04/27/19 2318  Weight: 75 kg 77.1 kg    Examination:  General exam: Appears calm and comfortable  Respiratory system: Clear to auscultation. Respiratory effort normal. Cardiovascular system: S1 & S2 heard, RRR. No JVD, murmurs, rubs, gallops or clicks. No pedal edema. Gastrointestinal system: Abdomen is nondistended, soft and nontender. No organomegaly or masses felt. Normal bowel sounds heard. Central nervous system: Alert and oriented. No focal neurological deficits. Extremities: Right upper extremity bandage clean dry intact, follow wound care/surgery note for postop wound description Skin: No rashes, lesions or ulcers other than above  Data Reviewed: I have personally reviewed following labs and imaging studies  CBC: Recent Labs  Lab 04/27/19 2253 04/28/19 0411 04/29/19 0800 05/01/19 0654 05/02/19 0450  WBC 16.0* 14.6* 9.5 6.4 7.4  NEUTROABS 12.0*  --   --   --   --   HGB 12.4* 10.8* 11.2* 11.9* 10.5*  HCT 39.5 34.9* 35.4* 38.3* 33.1*  MCV 84.2 84.5 84.7 86.3 84.4  PLT 227 152 71* 165 210   Basic Metabolic Panel: Recent Labs  Lab 04/27/19 2253 04/28/19 0411 04/29/19 0800 05/01/19 1035 05/02/19 0450  NA 133* 134* 137 139 137  K 4.1 3.9 4.2 3.6 3.9  CL 100 103 108 108 104  CO2 24 23 19* 22 24  GLUCOSE 206* 137* 261* 95 97  BUN 11 9 8 9 9  CREATININE 1.03 0.97 0.79 0.93 0.81  CALCIUM 8.5* 7.7* 8.3* 8.0* 8.1*   GFR:  Estimated Creatinine Clearance: 140.1 mL/min (by C-G formula based on SCr of 0.81 mg/dL). Liver Function Tests: Recent Labs  Lab 04/27/19 2253 04/28/19 0411 04/29/19 0800 05/01/19 1035 05/02/19 0450  AST 184* 128* 103* 71* 107*  ALT 325* 253* 210* 143* 156*  ALKPHOS 162* 128* 130* 125 138*  BILITOT 1.6* 1.6* 0.8 0.7 0.5  PROT 6.5 5.5* 5.7* 5.7* 5.6*  ALBUMIN 3.3* 2.7* 2.5* 2.3* 2.3*   Coagulation Profile: Recent Labs  Lab 04/27/19 2253  INR 1.1   Sepsis Labs: Recent Labs  Lab 04/27/19 2253 04/28/19 0411  LATICACIDVEN 1.6 1.1    Radiology Studies: No results found.  Scheduled Meds: . Chlorhexidine Gluconate Cloth  6 each Topical Q0600  . cloNIDine  0.1 mg Oral Daily  . docusate sodium  100 mg Oral BID  . gabapentin  300 mg Oral TID  . mupirocin ointment  1 application Nasal BID   Continuous Infusions: . sodium chloride 100 mL/hr at 05/02/19 0400  . anidulafungin    . methocarbamol (ROBAXIN) IV    . vancomycin Stopped (05/02/19 0110)    LOS: 4 days   Time spent: 25min    Little Ishikawa, DO [facility]  If 7PM-7AM, please contact night-coverage [email or web address] Password [facility] 05/02/2019, 8:46 AM

## 2019-05-03 ENCOUNTER — Encounter (HOSPITAL_COMMUNITY)
Admission: EM | Disposition: A | Discharge status: Home / Self Care | Payer: Self-pay | Source: Home / Self Care | Attending: Internal Medicine

## 2019-05-03 ENCOUNTER — Inpatient Hospital Stay (HOSPITAL_COMMUNITY): Payer: Self-pay | Admitting: Certified Registered"

## 2019-05-03 ENCOUNTER — Encounter (HOSPITAL_COMMUNITY): Payer: Self-pay | Admitting: Certified Registered"

## 2019-05-03 ENCOUNTER — Inpatient Hospital Stay (HOSPITAL_COMMUNITY): Payer: Self-pay

## 2019-05-03 DIAGNOSIS — R7881 Bacteremia: Secondary | ICD-10-CM

## 2019-05-03 DIAGNOSIS — R739 Hyperglycemia, unspecified: Secondary | ICD-10-CM

## 2019-05-03 HISTORY — PX: TEE WITHOUT CARDIOVERSION: SHX5443

## 2019-05-03 LAB — COMPREHENSIVE METABOLIC PANEL
ALT: 254 U/L — ABNORMAL HIGH (ref 0–44)
AST: 215 U/L — ABNORMAL HIGH (ref 15–41)
Albumin: 2.7 g/dL — ABNORMAL LOW (ref 3.5–5.0)
Alkaline Phosphatase: 167 U/L — ABNORMAL HIGH (ref 38–126)
Anion gap: 7 (ref 5–15)
BUN: 12 mg/dL (ref 6–20)
CO2: 23 mmol/L (ref 22–32)
Calcium: 8.5 mg/dL — ABNORMAL LOW (ref 8.9–10.3)
Chloride: 107 mmol/L (ref 98–111)
Creatinine, Ser: 0.85 mg/dL (ref 0.61–1.24)
GFR calc Af Amer: >60 mL/min (ref >60)
GFR calc non Af Amer: >60 mL/min (ref >60)
Glucose, Bld: 93 mg/dL (ref 70–99)
Potassium: 4.5 mmol/L (ref 3.5–5.1)
Sodium: 137 mmol/L (ref 135–145)
Total Bilirubin: 1.3 mg/dL — ABNORMAL HIGH (ref 0.3–1.2)
Total Protein: 6.3 g/dL — ABNORMAL LOW (ref 6.5–8.1)

## 2019-05-03 LAB — CBC
HCT: 39.4 % (ref 39.0–52.0)
Hemoglobin: 12.6 g/dL — ABNORMAL LOW (ref 13.0–17.0)
MCH: 26.6 pg (ref 26.0–34.0)
MCHC: 32 g/dL (ref 30.0–36.0)
MCV: 83.1 fL (ref 80.0–100.0)
Platelets: 270 10*3/uL (ref 150–400)
RBC: 4.74 MIL/uL (ref 4.22–5.81)
RDW: 15.2 % (ref 11.5–15.5)
WBC: 8.2 10*3/uL (ref 4.0–10.5)
nRBC: 0 % (ref 0.0–0.2)

## 2019-05-03 SURGERY — ECHOCARDIOGRAM, TRANSESOPHAGEAL
Anesthesia: Monitor Anesthesia Care

## 2019-05-03 MED ORDER — PROPOFOL 500 MG/50ML IV EMUL
INTRAVENOUS | Status: DC | PRN
  Administered 2019-05-03: 125 ug/kg/min via INTRAVENOUS

## 2019-05-03 MED ORDER — PROPOFOL 10 MG/ML IV BOLUS
INTRAVENOUS | Status: DC | PRN
  Administered 2019-05-03 (×4): 20 mg via INTRAVENOUS
  Administered 2019-05-03: 30 mg via INTRAVENOUS

## 2019-05-03 NOTE — Progress Notes (Addendum)
Pharmacy Antibiotic Note  Jon Castro is a 33 y.o. male admitted on 04/27/2019 with RUE abcess/cellulitis and MRSA bacteremia.  Pharmacy has been consulted for vancomycin dosing. SCr is currently stable with CrCl > 100 ml/min.   Plan: Cont Vanc 1500 mg q12h (Predicted AUC 448. Scr 0.81) Eraxis 100mg  IV q24h  Monitor renal function  Monitor levels as needed   Height: 6\' 1"  (185.4 cm) Weight: 169 lb 12.1 oz (77 kg) IBW/kg (Calculated) : 79.9  Temp (24hrs), Avg:98.1 F (36.7 C), Min:97.8 F (36.6 C), Max:98.4 F (36.9 C)  Recent Labs  Lab 04/27/19 2253 04/28/19 0411 04/29/19 0800 04/30/19 0837 04/30/19 1327 05/01/19 0654 05/01/19 1035 05/02/19 0450  WBC 16.0* 14.6* 9.5  --   --  6.4  --  7.4  CREATININE 1.03 0.97 0.79  --   --   --  0.93 0.81  LATICACIDVEN 1.6 1.1  --   --   --   --   --   --   VANCOTROUGH  --   --   --   --  13*  --   --   --   VANCORANDOM  --   --   --  25  --   --   --   --     Estimated Creatinine Clearance: 140 mL/min (by C-G formula based on SCr of 0.81 mg/dL).    Allergies  Allergen Reactions  . Vicodin [Hydrocodone-Acetaminophen] Itching  . Codeine Nausea And Vomiting  . Tramadol Other (See Comments)    "messes with head"   Antimicrobials this admission:  8/29 Zosyn x 1  8/30 Vanc >>  9/2: Eraxis>>  Microbiology results:  8/29 BCx: MRSA bacteremia, candida glabrata 8/31: BC x 2: ngtd 8/30 COVID: neg 8/30: MRSA PCR +  Gwenlyn Fudge - Student PharmD 05/03/2019 3:30 PM

## 2019-05-03 NOTE — Interval H&P Note (Signed)
History and Physical Interval Note:  05/03/2019 1:40 PM  Jon Castro  has presented today for surgery, with the diagnosis of BACTEREMIA.  The various methods of treatment have been discussed with the patient and family. After consideration of risks, benefits and other options for treatment, the patient has consented to  Procedure(s): TRANSESOPHAGEAL ECHOCARDIOGRAM (TEE) (N/A) as a surgical intervention.  The patient's history has been reviewed, patient examined, no change in status, stable for surgery.  I have reviewed the patient's chart and labs.  Questions were answered to the patient's satisfaction.     [employer]

## 2019-05-03 NOTE — Anesthesia Preprocedure Evaluation (Addendum)
Anesthesia Evaluation  Patient identified by MRN, date of birth, ID band Patient awake    Reviewed: Allergy & Precautions, NPO status , Patient's Chart, lab work & pertinent test results  History of Anesthesia Complications Negative for: history of anesthetic complications  Airway Mallampati: II  TM Distance: >3 FB Neck ROM: Full    Dental  (+) Poor Dentition, Chipped, Missing, Dental Advisory Given   Pulmonary Current Smoker and Patient abstained from smoking.,  04/28/2019 SARS coronavirus NEG   breath sounds clear to auscultation       Cardiovascular (-) hypertension Rhythm:Regular Rate:Normal  04/29/2019 ECHO: EF 65%, valves OK   Neuro/Psych Depression negative neurological ROS     GI/Hepatic negative GI ROS, (+)     substance abuse  marijuana use and IV drug use, Elevated LFTs   Endo/Other  negative endocrine ROS  Renal/GU negative Renal ROS     Musculoskeletal  (+) narcotic dependent  Abdominal   Peds  Hematology negative hematology ROS (+)   Anesthesia Other Findings   Reproductive/Obstetrics                            Anesthesia Physical Anesthesia Plan  ASA: III  Anesthesia Plan: MAC   Post-op Pain Management:    Induction:   PONV Risk Score and Plan: 0 and Treatment may vary due to age or medical condition  Airway Management Planned: Natural Airway and Nasal Cannula  Additional Equipment:   Intra-op Plan:   Post-operative Plan:   Informed Consent: I have reviewed the patients History and Physical, chart, labs and discussed the procedure including the risks, benefits and alternatives for the proposed anesthesia with the patient or authorized representative who has indicated his/her understanding and acceptance.     Dental advisory given  Plan Discussed with: CRNA and Surgeon  Anesthesia Plan Comments:        Anesthesia Quick Evaluation

## 2019-05-03 NOTE — Anesthesia Postprocedure Evaluation (Signed)
Anesthesia Post Note  Patient: Jon Castro  Procedure(s) Performed: TRANSESOPHAGEAL ECHOCARDIOGRAM (TEE) (N/A )     Patient location during evaluation: Endoscopy Anesthesia Type: MAC Level of consciousness: awake and alert, patient cooperative and oriented Pain management: pain level controlled Vital Signs Assessment: post-procedure vital signs reviewed and stable Respiratory status: respiratory function stable, spontaneous breathing and nonlabored ventilation Cardiovascular status: blood pressure returned to baseline and stable Postop Assessment: no apparent nausea or vomiting Anesthetic complications: no    Last Vitals:  Vitals:   05/03/19 1424 05/03/19 1434  BP: 113/76 120/70  Pulse: (!) 53 (!) 44  Resp: 20 17  Temp:    SpO2: 96% 100%    Last Pain:  Vitals:   05/03/19 1320  TempSrc: Temporal  PainSc: 5                  Gennesis Hogland,E. Sofya Moustafa

## 2019-05-03 NOTE — Anesthesia Procedure Notes (Signed)
Procedure Name: MAC Date/Time: 05/03/2019 1:57 PM Performed by: Barrington Ellison, CRNA Pre-anesthesia Checklist: Patient identified, Emergency Drugs available, Suction available, Patient being monitored and Timeout performed Patient Re-evaluated:Patient Re-evaluated prior to induction Oxygen Delivery Method: Nasal cannula

## 2019-05-03 NOTE — Transfer of Care (Signed)
Immediate Anesthesia Transfer of Care Note  Patient: Jon Castro  Procedure(s) Performed: TRANSESOPHAGEAL ECHOCARDIOGRAM (TEE) (N/A )  Patient Location: Endoscopy Unit  Anesthesia Type:MAC  Level of Consciousness: drowsy  Airway & Oxygen Therapy: Patient Spontanous Breathing  Post-op Assessment: Report given to RN  Post vital signs: Reviewed and stable  Last Vitals:  Vitals Value Taken Time  BP 113/76 05/03/19 1424  Temp    Pulse 53 05/03/19 1424  Resp 20 05/03/19 1424  SpO2 96 % 05/03/19 1424  Vitals shown include unvalidated device data.  Last Pain:  Vitals:   05/03/19 1320  TempSrc: Temporal  PainSc: 5       Patients Stated Pain Goal: 2 (37/10/62 6948)  Complications: No apparent anesthesia complications

## 2019-05-03 NOTE — Plan of Care (Signed)

## 2019-05-03 NOTE — Progress Notes (Signed)
PROGRESS NOTE  Jon Castro ELT:[id number] DOB: [birth year] DOA: 04/27/2019 PCP: Patient, [name] Per   LOS: 5 days   Brief Narrative / Interim history: 33 year old male with history of IV drug use, chronic pain, depression with recent suicide attempt was admitted to the hospital on 04/27/2019 due to a right forearm lesion/abscess after using IV drugs.  Orthopedic surgery was consulted and patient underwent incision/drainage in the OR.  His blood cultures showed MRSA as well as Candida glabrata.  ID was consulted  Subjective: Sleepy this morning, no significant complaints.  Awaiting TEE  Assessment & Plan: Principal Problem:   Abscess of right forearm Active Problems:   MDD (major depressive disorder), severe (HCC)   Opioid dependence with opioid-induced mood disorder (HCC)   Hyperglycemia   Leucocytosis   LFTs abnormal   MRSA bacteremia   IVDU (intravenous drug user)   Fungemia   Blood culture positive for Candida species   Principal Problem Right forearm abscess, MRSA bacteremia and Candida glabrata positive blood cultures -s/p incision and drainage in the OR, appreciate orthopedic surgery follow-up -ID following, currently on anidulafungin as well as vancomycin, anticipate at least a few weeks of intravenous antibiotics, not patient not a candidate for PICC line -TEE pending today  Active Problems Depression with anxiety/history of suicidal ideation -He just had a stay at behavioral health prior to this admission.  Currently stable  Elevated LFTs -Concern for department/illicit substance use.  Continue to monitor   Scheduled Meds: . Chlorhexidine Gluconate Cloth  6 each Topical Q0600  . [MAR Hold] docusate sodium  100 mg Oral BID  . [MAR Hold] gabapentin  300 mg Oral TID   Continuous Infusions: . sodium chloride 100 mL/hr at 05/03/19 0647  . [MAR Hold] anidulafungin 100 mg (05/03/19 1018)  . [MAR Hold] methocarbamol (ROBAXIN) IV    . [MAR Hold] vancomycin 1,500 mg  (05/03/19 1013)   PRN Meds:.[MAR Hold] acetaminophen, [MAR Hold] diphenhydrAMINE, [MAR Hold] methocarbamol **OR** [MAR Hold] methocarbamol (ROBAXIN) IV, [MAR Hold] ondansetron **OR** [MAR Hold] ondansetron (ZOFRAN) IV, [MAR Hold] oxyCODONE, [MAR Hold] polyethylene glycol, [MAR Hold] sodium chloride flush  DVT prophylaxis: SCDs Code Status: Full code Family Communication: d/w patient  Disposition Plan: TBD, currently needs IV antibiotics   Consultants:   ID  Orthopedic surgery  Cardiology for TEE  Procedures:   2D echo IMPRESSIONS  1. The left ventricle has hyperdynamic systolic function, with an ejection fraction of >65%. The cavity size was normal. Left ventricular diastolic parameters were normal.  2. The aortic valve is tricuspid. No stenosis of the aortic valve.  3. The aortic root is normal in size and structure.  4. The right ventricle has normal systolic function. The cavity was normal. There is no increase in right ventricular wall thickness.  5. No evidence of mitral valve stenosis. No mitral regurgitation noted.  6. Normal IVC size. No complete TR doppler jet so unable to estimate PA systolic pressure.  7. No valvular vegetation noted.   TEE  Antimicrobials:  Vancomycin 8/28  Anidulafungin 9/2  Objective: Vitals:   05/02/19 1508 05/02/19 2122 05/03/19 0549 05/03/19 1320  BP: 120/89 123/82 118/80 (!) 138/92  Pulse: 61 64 71 (!) 49  Resp: 18 18 18 18   Temp: 98.3 F (36.8 C) 98.3 F (36.8 C) 98 F (36.7 C) 98.4 F (36.9 C)  TempSrc: Oral Oral Oral Temporal  SpO2: 100% 100% 100% 100%  Weight:    77 kg  Height:    6\' 1"  (1.854 m)  Intake/Output Summary (Last 24 hours) at 05/03/2019 1353 Last data filed at 05/03/2019 0400 Gross per 24 hour  Intake 2247.92 ml  Output -  Net 2247.92 ml   Filed Weights   04/27/19 2257 04/27/19 2318 05/03/19 1320  Weight: 75 kg 77.1 kg 77 kg    Examination:  Constitutional: NAD Eyes: PERRL, lids and conjunctivae  normal ENMT: Mucous membranes are moist.  Respiratory: clear to auscultation bilaterally, no wheezing, no crackles. Normal respiratory effort.  Cardiovascular: Regular rate and rhythm, no murmurs / rubs / gallops. No LE edema. Abdomen: no tenderness. Bowel sounds positive.  Musculoskeletal: no clubbing / cyanosis. Right forearm wrapped Neurologic: CN 2-12 grossly intact. Strength 5/5 in all 4.    Data Reviewed: I have independently reviewed following labs and imaging studies   CBC: Recent Labs  Lab 04/27/19 2253 04/28/19 0411 04/29/19 0800 05/01/19 0654 05/02/19 0450  WBC 16.0* 14.6* 9.5 6.4 7.4  NEUTROABS 12.0*  --   --   --   --   HGB 12.4* 10.8* 11.2* 11.9* 10.5*  HCT 39.5 34.9* 35.4* 38.3* 33.1*  MCV 84.2 84.5 84.7 86.3 84.4  PLT 227 152 71* 165 016   Basic Metabolic Panel: Recent Labs  Lab 04/27/19 2253 04/28/19 0411 04/29/19 0800 05/01/19 1035 05/02/19 0450  NA 133* 134* 137 139 137  K 4.1 3.9 4.2 3.6 3.9  CL 100 103 108 108 104  CO2 24 23 19* 22 24  GLUCOSE 206* 137* 261* 95 97  BUN 11 9 8 9 9   CREATININE 1.03 0.97 0.79 0.93 0.81  CALCIUM 8.5* 7.7* 8.3* 8.0* 8.1*   GFR: Estimated Creatinine Clearance: 140 mL/min (by C-G formula based on SCr of 0.81 mg/dL). Liver Function Tests: Recent Labs  Lab 04/27/19 2253 04/28/19 0411 04/29/19 0800 05/01/19 1035 05/02/19 0450  AST 184* 128* 103* 71* 107*  ALT 325* 253* 210* 143* 156*  ALKPHOS 162* 128* 130* 125 138*  BILITOT 1.6* 1.6* 0.8 0.7 0.5  PROT 6.5 5.5* 5.7* 5.7* 5.6*  ALBUMIN 3.3* 2.7* 2.5* 2.3* 2.3*   No results for input(s): LIPASE, AMYLASE in the last 168 hours. No results for input(s): AMMONIA in the last 168 hours. Coagulation Profile: Recent Labs  Lab 04/27/19 2253  INR 1.1   Cardiac Enzymes: No results for input(s): CKTOTAL, CKMB, CKMBINDEX, TROPONINI in the last 168 hours. BNP (last 3 results) No results for input(s): PROBNP in the last 8760 hours. HbA1C: No results for input(s):  HGBA1C in the last 72 hours. CBG: Recent Labs  Lab 04/29/19 1817  GLUCAP 188*   Lipid Profile: No results for input(s): CHOL, HDL, LDLCALC, TRIG, CHOLHDL, LDLDIRECT in the last 72 hours. Thyroid Function Tests: No results for input(s): TSH, T4TOTAL, FREET4, T3FREE, THYROIDAB in the last 72 hours. Anemia Panel: No results for input(s): VITAMINB12, FOLATE, FERRITIN, TIBC, IRON, RETICCTPCT in the last 72 hours. Urine analysis:    Component Value Date/Time   COLORURINE YELLOW 02/09/2011 2210   APPEARANCEUR CLOUDY (A) 02/09/2011 2210   LABSPEC 1.025 02/09/2011 2210   PHURINE 7.0 02/09/2011 2210   GLUCOSEU NEGATIVE 02/09/2011 2210   HGBUR NEGATIVE 02/09/2011 2210   BILIRUBINUR NEGATIVE 02/09/2011 2210   KETONESUR NEGATIVE 02/09/2011 2210   PROTEINUR NEGATIVE 02/09/2011 2210   UROBILINOGEN 1.0 02/09/2011 2210   NITRITE NEGATIVE 02/09/2011 2210   LEUKOCYTESUR NEGATIVE 02/09/2011 2210   Sepsis Labs: Invalid input(s): PROCALCITONIN, LACTICIDVEN  Recent Results (from the past 240 hour(s))  Culture, blood (Routine x 2)  Status: Abnormal (Preliminary result)   Collection Time: 04/27/19 10:45 PM   Specimen: BLOOD RIGHT ARM  Result Value Ref Range Status   Specimen Description BLOOD RIGHT ARM  Final   Special Requests   Final    BOTTLES DRAWN AEROBIC AND ANAEROBIC Blood Culture adequate volume   Culture  Setup Time   Final    GRAM POSITIVE COCCI AEROBIC BOTTLE ONLY CRITICAL RESULT CALLED TO, READ BACK BY AND VERIFIED WITH: L. POWELL, PHARMD AT 1935 ON 04/28/19 BY C. JESSUP, MT. YEAST ANAEROBIC BOTTLE ONLY CRITICAL RESULT CALLED TO, READ BACK BY AND VERIFIED WITH: J. LEDFORD,PHARMD 1610 05/01/2019 Girtha Hake Performed at [facility], [address] [place], [place] [address]    Culture (A)  Final    METHICILLIN RESISTANT STAPHYLOCOCCUS AUREUS YEAST    Report Status PENDING  Incomplete   Organism ID, Bacteria METHICILLIN RESISTANT STAPHYLOCOCCUS AUREUS  Final       Susceptibility   Methicillin resistant staphylococcus aureus - MIC*    CIPROFLOXACIN <=0.5 SENSITIVE Sensitive     ERYTHROMYCIN >=8 RESISTANT Resistant     GENTAMICIN <=0.5 SENSITIVE Sensitive     OXACILLIN >=4 RESISTANT Resistant     TETRACYCLINE <=1 SENSITIVE Sensitive     VANCOMYCIN <=0.5 SENSITIVE Sensitive     TRIMETH/SULFA <=10 SENSITIVE Sensitive     CLINDAMYCIN <=0.25 SENSITIVE Sensitive     RIFAMPIN <=0.5 SENSITIVE Sensitive     Inducible Clindamycin NEGATIVE Sensitive     * METHICILLIN RESISTANT STAPHYLOCOCCUS AUREUS  Blood Culture ID Panel (Reflexed)     Status: Abnormal   Collection Time: 04/27/19 10:45 PM  Result Value Ref Range Status   Enterococcus species NOT DETECTED NOT DETECTED Final   Listeria monocytogenes NOT DETECTED NOT DETECTED Final   Staphylococcus species DETECTED (A) NOT DETECTED Final    Comment: CRITICAL RESULT CALLED TO, READ BACK BY AND VERIFIED WITH: L. POWELL, PHARMD AT 1935 ON 04/28/19 BY C. JESSUP, MT.    Staphylococcus aureus (BCID) DETECTED (A) NOT DETECTED Final    Comment: Methicillin (oxacillin)-resistant Staphylococcus aureus (MRSA). MRSA is predictably resistant to beta-lactam antibiotics (except ceftaroline). Preferred therapy is vancomycin unless clinically contraindicated. Patient requires contact precautions if  hospitalized. CRITICAL RESULT CALLED TO, READ BACK BY AND VERIFIED WITH: L. POWELL, PHARMD AT 1935 ON 04/28/19 BY C. JESSUP, MT.    Methicillin resistance DETECTED (A) NOT DETECTED Final    Comment: CRITICAL RESULT CALLED TO, READ BACK BY AND VERIFIED WITH: L. POWELL, PHARMD AT 1935 ON 04/28/19 BY C. JESSUP, MT.    Streptococcus species NOT DETECTED NOT DETECTED Final   Streptococcus agalactiae NOT DETECTED NOT DETECTED Final   Streptococcus pneumoniae NOT DETECTED NOT DETECTED Final   Streptococcus pyogenes NOT DETECTED NOT DETECTED Final   Acinetobacter baumannii NOT DETECTED NOT DETECTED Final   Enterobacteriaceae species  NOT DETECTED NOT DETECTED Final   Enterobacter cloacae complex NOT DETECTED NOT DETECTED Final   Escherichia coli NOT DETECTED NOT DETECTED Final   Klebsiella oxytoca NOT DETECTED NOT DETECTED Final   Klebsiella pneumoniae NOT DETECTED NOT DETECTED Final   Proteus species NOT DETECTED NOT DETECTED Final   Serratia marcescens NOT DETECTED NOT DETECTED Final   Haemophilus influenzae NOT DETECTED NOT DETECTED Final   Neisseria meningitidis NOT DETECTED NOT DETECTED Final   Pseudomonas aeruginosa NOT DETECTED NOT DETECTED Final   Candida albicans NOT DETECTED NOT DETECTED Final   Candida glabrata NOT DETECTED NOT DETECTED Final   Candida krusei NOT DETECTED NOT DETECTED  Final   Candida parapsilosis NOT DETECTED NOT DETECTED Final   Candida tropicalis NOT DETECTED NOT DETECTED Final    Comment: Performed at [facility], [address] [place], [place] [address]  Culture, blood (Routine x 2)     Status: None (Preliminary result)   Collection Time: 04/27/19 10:55 PM   Specimen: BLOOD  Result Value Ref Range Status   Specimen Description BLOOD RIGHT HAND  Final   Special Requests   Final    BOTTLES DRAWN AEROBIC ONLY Blood Culture results may not be optimal due to an excessive volume of blood received in culture bottles   Culture  Setup Time   Final    YEAST AEROBIC BOTTLE ONLY CRITICAL RESULT CALLED TO, READ BACK BY AND VERIFIED WITH: J. LEDFORD,PHARMD [id number] 05/01/2019 Girtha Hake Performed at [facility], [address] [place], [place] [address]    Culture YEAST  Final   Report Status PENDING  Incomplete  Blood Culture ID Panel (Reflexed)     Status: Abnormal   Collection Time: 04/27/19 10:55 PM  Result Value Ref Range Status   Enterococcus species NOT DETECTED NOT DETECTED Final   Listeria monocytogenes NOT DETECTED NOT DETECTED Final   Staphylococcus species NOT DETECTED NOT DETECTED Final   Staphylococcus aureus (BCID) NOT DETECTED NOT DETECTED Final   Streptococcus  species NOT DETECTED NOT DETECTED Final   Streptococcus agalactiae NOT DETECTED NOT DETECTED Final   Streptococcus pneumoniae NOT DETECTED NOT DETECTED Final   Streptococcus pyogenes NOT DETECTED NOT DETECTED Final   Acinetobacter baumannii NOT DETECTED NOT DETECTED Final   Enterobacteriaceae species NOT DETECTED NOT DETECTED Final   Enterobacter cloacae complex NOT DETECTED NOT DETECTED Final   Escherichia coli NOT DETECTED NOT DETECTED Final   Klebsiella oxytoca NOT DETECTED NOT DETECTED Final   Klebsiella pneumoniae NOT DETECTED NOT DETECTED Final   Proteus species NOT DETECTED NOT DETECTED Final   Serratia marcescens NOT DETECTED NOT DETECTED Final   Haemophilus influenzae NOT DETECTED NOT DETECTED Final   Neisseria meningitidis NOT DETECTED NOT DETECTED Final   Pseudomonas aeruginosa NOT DETECTED NOT DETECTED Final   Candida albicans NOT DETECTED NOT DETECTED Final   Candida glabrata DETECTED (A) NOT DETECTED Final    Comment: CRITICAL RESULT CALLED TO, READ BACK BY AND VERIFIED WITH: J. LEDFORD,PHARMD 1914 05/01/2019 T. TYSOR    Candida krusei NOT DETECTED NOT DETECTED Final   Candida parapsilosis NOT DETECTED NOT DETECTED Final   Candida tropicalis NOT DETECTED NOT DETECTED Final    Comment: Performed at [facility], [address] [place], [place] [address]  SARS CORONAVIRUS 2 (TAT 6-12 HRS) Nasal Swab Aptima Multi Swab     Status: None   Collection Time: 04/28/19  3:21 AM   Specimen: Aptima Multi Swab; Nasal Swab  Result Value Ref Range Status   SARS Coronavirus 2 NEGATIVE NEGATIVE Final    Comment: (NOTE) SARS-CoV-2 target nucleic acids are NOT DETECTED. The SARS-CoV-2 RNA is generally detectable in upper and lower respiratory specimens during the acute phase of infection. Negative results do not preclude SARS-CoV-2 infection, do not rule out co-infections with other pathogens, and should not be used as the sole basis for treatment or other patient management  decisions. Negative results must be combined with clinical observations, patient history, and epidemiological information. The expected result is Negative. Fact Sheet for Patients: HairSlick.no Fact Sheet for Healthcare Providers: quierodirigir.com This test is not yet approved or cleared by the [place]  FDA and  has been authorized for detection and/or diagnosis of SARS-CoV-2 by FDA under an Emergency Use Authorization (EUA). This EUA will remain  in effect (meaning this test can be used) for the duration of the COVID-19 declaration under Section 56 4(b)(1) of the Act, 21 U.S.C. section 360bbb-3(b)(1), unless the authorization is terminated or revoked sooner. Performed at [facility], [address] [place], [place] [address]   SARS Coronavirus 2 [facility](Hospital order, Performed in [facility] hospital lab) Nasopharyngeal Nasopharyngeal Swab     Status: None   Collection Time: 04/28/19  3:14 PM   Specimen: Nasopharyngeal Swab  Result Value Ref Range Status   SARS Coronavirus 2 NEGATIVE NEGATIVE Final    Comment: (NOTE) If result is NEGATIVE SARS-CoV-2 target nucleic acids are NOT DETECTED. The SARS-CoV-2 RNA is generally detectable in upper and lower  respiratory specimens during the acute phase of infection. The lowest  concentration of SARS-CoV-2 viral copies this assay can detect is 250  copies / mL. A negative result does not preclude SARS-CoV-2 infection  and should not be used as the sole basis for treatment or other  patient management decisions.  A negative result may occur with  improper specimen collection / handling, submission of specimen other  than nasopharyngeal swab, presence of viral mutation(s) within the  areas targeted by this assay, and inadequate number of viral copies  (<250 copies / mL). A negative result must be combined with clinical  observations, patient history, and epidemiological  information. If result is POSITIVE SARS-CoV-2 target nucleic acids are DETECTED. The SARS-CoV-2 RNA is generally detectable in upper and lower  respiratory specimens dur ing the acute phase of infection.  Positive  results are indicative of active infection with SARS-CoV-2.  Clinical  correlation with patient history and other diagnostic information is  necessary to determine patient infection status.  Positive results do  not rule out bacterial infection or co-infection with other viruses. If result is PRESUMPTIVE POSTIVE SARS-CoV-2 nucleic acids MAY BE PRESENT.   A presumptive positive result was obtained on the submitted specimen  and confirmed on repeat testing.  While 2019 novel coronavirus  (SARS-CoV-2) nucleic acids may be present in the submitted sample  additional confirmatory testing may be necessary for epidemiological  and / or clinical management purposes  to differentiate between  SARS-CoV-2 and other Sarbecovirus currently known to infect humans.  If clinically indicated additional testing with an alternate test  methodology [phone]([id number]) is advised. The SARS-CoV-2 RNA is generally  detectable in upper and lower respiratory sp ecimens during the acute  phase of infection. The expected result is Negative. Fact Sheet for Patients:  BoilerBrush.com.cyhttps://www.fda.gov/media/136312/download Fact Sheet for Healthcare Providers: https://pope.com/https://www.fda.gov/media/136313/download This test is not yet approved or cleared by the [place] FDA and has been authorized for detection and/or diagnosis of SARS-CoV-2 by FDA under an Emergency Use Authorization (EUA).  This EUA will remain in effect (meaning this test can be used) for the duration of the COVID-19 declaration under Section 564(b)(1) of the Act, 21 U.S.C. section 360bbb-3(b)(1), unless the authorization is terminated or revoked sooner. Performed at [facility], [address] [place], [place] [address]   MRSA PCR Screening      Status: Abnormal   Collection Time: 04/28/19  4:21 PM   Specimen: Nasal Mucosa; Nasopharyngeal  Result Value Ref Range Status   MRSA by PCR POSITIVE (A) NEGATIVE Final    Comment:        The GeneXpert MRSA  Assay (FDA approved for NASAL specimens only), is one component of a comprehensive MRSA colonization surveillance program. It is not intended to diagnose MRSA infection nor to guide or monitor treatment for MRSA infections. CRITICAL RESULT CALLED TO, READ BACK BY AND VERIFIED WITH: RN [facility] SapuLPaNANCY IRISH [phone] [id number] FCP Performed at [facility], [address] [place], [place] [address]   Aerobic/Anaerobic Culture (surgical/deep wound)     Status: None (Preliminary result)   Collection Time: 04/28/19  8:08 PM   Specimen: Abscess  Result Value Ref Range Status   Specimen Description ABSCESS RIGHT FOREARM  Final   Special Requests PATIENT ON FOLLOWING VANCOMYCIN,ANCEF  Final   Gram Stain   Final    MODERATE WBC PRESENT, PREDOMINANTLY PMN FEW GRAM POSITIVE COCCI Performed at [facility], [address] [place], [place] [address]    Culture   Final    ABUNDANT METHICILLIN RESISTANT STAPHYLOCOCCUS AUREUS NO ANAEROBES ISOLATED; CULTURE IN PROGRESS FOR 5 DAYS    Report Status PENDING  Incomplete   Organism ID, Bacteria METHICILLIN RESISTANT STAPHYLOCOCCUS AUREUS  Final      Susceptibility   Methicillin resistant staphylococcus aureus - MIC*    CIPROFLOXACIN <=0.5 SENSITIVE Sensitive     ERYTHROMYCIN >=8 RESISTANT Resistant     GENTAMICIN <=0.5 SENSITIVE Sensitive     OXACILLIN >=4 RESISTANT Resistant     TETRACYCLINE <=1 SENSITIVE Sensitive     VANCOMYCIN <=0.5 SENSITIVE Sensitive     TRIMETH/SULFA <=10 SENSITIVE Sensitive     CLINDAMYCIN >=8 RESISTANT Resistant     RIFAMPIN <=0.5 SENSITIVE Sensitive     Inducible Clindamycin NEGATIVE Sensitive     * ABUNDANT METHICILLIN RESISTANT STAPHYLOCOCCUS AUREUS  Culture, blood (routine x 2)     Status: None (Preliminary  result)   Collection Time: 04/29/19 12:20 PM   Specimen: BLOOD LEFT HAND  Result Value Ref Range Status   Specimen Description BLOOD LEFT HAND  Final   Special Requests   Final    BOTTLES DRAWN AEROBIC ONLY Blood Culture adequate volume   Culture   Final    NO GROWTH 4 DAYS Performed at [facility], [address] [place], [place] [address]    Report Status PENDING  Incomplete  Culture, blood (routine x 2)     Status: None (Preliminary result)   Collection Time: 04/29/19 12:25 PM   Specimen: BLOOD  Result Value Ref Range Status   Specimen Description BLOOD LEFT ANTECUBITAL  Final   Special Requests   Final    BOTTLES DRAWN AEROBIC ONLY Blood Culture adequate volume   Culture   Final    NO GROWTH 4 DAYS Performed at [facility], [address] [place], [place] [address]    Report Status PENDING  Incomplete      Radiology Studies: No results found.   Pamella Pertostin Lorella Gomez, MD, PhD [facility]  Contact via  [email or web address]  [facility] Office Info P: [phone] F: [phone]

## 2019-05-03 NOTE — Progress Notes (Signed)
Echocardiogram Echocardiogram Transesophageal has been performed.  Oneal Deputy Janelie Goltz 05/03/2019, 2:27 PM

## 2019-05-03 NOTE — CV Procedure (Signed)
   Transesophageal Echocardiogram  Indications: Bacteremia  Time out performed Propofol with anesthesia Findings:  Left Ventricle: 60% EF normal  Mitral Valve: Normal no MR  Aortic Valve: Normal no AI  Tricuspid Valve: Normal to TR  Left Atrium: Normal   NO VEGETATIONS  Candee Furbish, MD

## 2019-05-03 NOTE — Progress Notes (Signed)
[facility]   Reason for visit: Follow up on MRSA sepsis/fungemia with RT forearm abscess  Antibiotics: Vancomycin, day 7 Anidulafungin, day 3  Interval History: TEE performed today which showed no evidence of endocarditis. Appetite strong. RT forearm dressing changed by ortho this morning. Fever curves, WBC trends, cx results, ABX usage, imaging, and OP notes all independently reviewed    Current Facility-Administered Medications:  .  0.9 %  sodium chloride infusion, , Intravenous, Continuous, Jake BatheSkains, Mark C, MD, Last Rate: 100 mL/hr at 05/03/19 0647 .  acetaminophen (TYLENOL) tablet 325-650 mg, 325-650 mg, Oral, Q6H PRN, Jake BatheSkains, Mark C, MD, 650 mg at 05/01/19 0046 .  anidulafungin (ERAXIS) 100 mg in sodium chloride 0.9 % 100 mL IVPB, 100 mg, Intravenous, Q24H, Skains, Mark C, MD, Last Rate: 78 mL/hr at 05/03/19 1018, 100 mg at 05/03/19 1018 .  Chlorhexidine Gluconate Cloth 2 % PADS 6 each, 6 each, Topical, Q0600, Albina BilletMartensen, Henry Calvin III, PA-C, 6 each at 05/03/19 [phone] .  diphenhydrAMINE (BENADRYL) 12.5 MG/5ML elixir 12.5-25 mg, 12.5-25 mg, Oral, Q4H PRN, Donato SchultzSkains, Mark C, MD .  docusate sodium (COLACE) capsule 100 mg, 100 mg, Oral, BID, Jake BatheSkains, Mark C, MD, 100 mg at 05/02/19 2116 .  gabapentin (NEURONTIN) capsule 300 mg, 300 mg, Oral, TID, Jake BatheSkains, Mark C, MD, 300 mg at 05/03/19 1517 .  methocarbamol (ROBAXIN) tablet 500 mg, 500 mg, Oral, Q6H PRN, 500 mg at 05/02/19 2116 **OR** methocarbamol (ROBAXIN) 500 mg in dextrose 5 % 50 mL IVPB, 500 mg, Intravenous, Q6H PRN, Skains, Mark C, MD .  ondansetron (ZOFRAN) tablet 4 mg, 4 mg, Oral, Q6H PRN **OR** ondansetron (ZOFRAN) injection 4 mg, 4 mg, Intravenous, Q6H PRN, Skains, Mark C, MD .  oxyCODONE (Oxy IR/ROXICODONE) immediate release tablet 5-10 mg, 5-10 mg, Oral, Q6H PRN, Jake BatheSkains, Mark C, MD, 10 mg at 05/03/19 1517 .  polyethylene glycol (MIRALAX / GLYCOLAX) packet 17 g, 17 g, Oral, Daily PRN, Skains, Mark C, MD .   sodium chloride flush (NS) 0.9 % injection 10-40 mL, 10-40 mL, Intracatheter, PRN, Jake BatheSkains, Mark C, MD .  vancomycin (VANCOCIN) 1,500 mg in sodium chloride 0.9 % 500 mL IVPB, 1,500 mg, Intravenous, Q12H, Jake BatheSkains, Mark C, MD, Last Rate: 250 mL/hr at 05/03/19 1013, 1,500 mg at 05/03/19 1013   Physical Exam:   Vitals:   05/03/19 1434 05/03/19 1528  BP: 120/70 126/81  Pulse: (!) 44 (!) 56  Resp: 17 18  Temp:  97.8 F (36.6 C)  SpO2: 100% 100%   Physical Exam Gen: pleasant, NAD, A&O x 3 Head: NCAT, no temporal wasting evident EENT: PERRL, EOMI, MMM, adequate dentition Neck: supple, no JVD CV: NRRR, no murmurs appreciated Pulm: CTA bilaterally, mild expiratory wheeze, no retractions Abd: soft, NTND, +BS (hypoactive) Extrems: 1+ RT arm/hand edema, no LE edema, 2+ pulses Skin: multiple tattoos, adequate skin turgor MSK: RT forearm dressed with compressive bandaging (no drain intact) Neuro: CN II-XII grossly intact, no focal neurologic deficits appreciated, gait was not assessed, A&Ox 3   Review of Systems:  Review of Systems  Constitutional: Negative for chills, fever and weight loss.  HENT: Negative for congestion, hearing loss, sinus pain and sore throat.   Eyes: Negative for blurred vision, photophobia and discharge.  Respiratory: Negative for cough, hemoptysis and shortness of breath.   Cardiovascular: Negative for chest pain, palpitations, orthopnea and leg swelling.  Gastrointestinal: Negative for abdominal pain, constipation, diarrhea, heartburn, nausea and vomiting.  Genitourinary: Negative for dysuria, flank pain,  frequency and urgency.  Musculoskeletal: Positive for joint pain and myalgias. Negative for back pain.       RT arm/elbow  Skin: Negative for itching and rash.  Neurological: Negative for tremors, seizures, weakness and headaches.  Endo/Heme/Allergies: Negative for polydipsia. Does not bruise/bleed easily.  Psychiatric/Behavioral: Positive for substance abuse.  Negative for depression. The patient is nervous/anxious. The patient does not have insomnia.      Lab Results  Component Value Date   WBC 8.2 05/03/2019   HGB 12.6 (L) 05/03/2019   HCT 39.4 05/03/2019   MCV 83.1 05/03/2019   PLT 270 05/03/2019    Lab Results  Component Value Date   CREATININE 0.85 05/03/2019   BUN 12 05/03/2019   NA 137 05/03/2019   K 4.5 05/03/2019   CL 107 05/03/2019   CO2 23 05/03/2019    Lab Results  Component Value Date   ALT 254 (H) 05/03/2019   AST 215 (H) 05/03/2019   ALKPHOS 167 (H) 05/03/2019     Microbiology: Recent Results (from the past 240 hour(s))  Culture, blood (Routine x 2)     Status: Abnormal (Preliminary result)   Collection Time: 04/27/19 10:45 PM   Specimen: BLOOD RIGHT ARM  Result Value Ref Range Status   Specimen Description BLOOD RIGHT ARM  Final   Special Requests   Final    BOTTLES DRAWN AEROBIC AND ANAEROBIC Blood Culture adequate volume   Culture  Setup Time   Final    GRAM POSITIVE COCCI AEROBIC BOTTLE ONLY CRITICAL RESULT CALLED TO, READ BACK BY AND VERIFIED WITH: L. POWELL, PHARMD AT 1935 ON 04/28/19 BY C. JESSUP, MT. YEAST ANAEROBIC BOTTLE ONLY CRITICAL RESULT CALLED TO, READ BACK BY AND VERIFIED WITH: J. LEDFORD,PHARMD [id number] 05/01/2019 Girtha Hake. TYSOR Performed at [facility], [address] [place], [place] [id number]    Culture (A)  Final    METHICILLIN RESISTANT STAPHYLOCOCCUS AUREUS YEAST    Report Status PENDING  Incomplete   Organism ID, Bacteria METHICILLIN RESISTANT STAPHYLOCOCCUS AUREUS  Final      Susceptibility   Methicillin resistant staphylococcus aureus - MIC*    CIPROFLOXACIN <=0.5 SENSITIVE Sensitive     ERYTHROMYCIN >=8 RESISTANT Resistant     GENTAMICIN <=0.5 SENSITIVE Sensitive     OXACILLIN >=4 RESISTANT Resistant     TETRACYCLINE <=1 SENSITIVE Sensitive     VANCOMYCIN <=0.5 SENSITIVE Sensitive     TRIMETH/SULFA <=10 SENSITIVE Sensitive     CLINDAMYCIN <=0.25 SENSITIVE Sensitive      RIFAMPIN <=0.5 SENSITIVE Sensitive     Inducible Clindamycin NEGATIVE Sensitive     * METHICILLIN RESISTANT STAPHYLOCOCCUS AUREUS  Blood Culture ID Panel (Reflexed)     Status: Abnormal   Collection Time: 04/27/19 10:45 PM  Result Value Ref Range Status   Enterococcus species NOT DETECTED NOT DETECTED Final   Listeria monocytogenes NOT DETECTED NOT DETECTED Final   Staphylococcus species DETECTED (A) NOT DETECTED Final    Comment: CRITICAL RESULT CALLED TO, READ BACK BY AND VERIFIED WITH: L. POWELL, PHARMD AT 1935 ON 04/28/19 BY C. JESSUP, MT.    Staphylococcus aureus (BCID) DETECTED (A) NOT DETECTED Final    Comment: Methicillin (oxacillin)-resistant Staphylococcus aureus (MRSA). MRSA is predictably resistant to beta-lactam antibiotics (except ceftaroline). Preferred therapy is vancomycin unless clinically contraindicated. Patient requires contact precautions if  hospitalized. CRITICAL RESULT CALLED TO, READ BACK BY AND VERIFIED WITH: L. POWELL, PHARMD AT 1935 ON 04/28/19 BY C. JESSUP, MT.    Methicillin resistance DETECTED (  A) NOT DETECTED Final    Comment: CRITICAL RESULT CALLED TO, READ BACK BY AND VERIFIED WITH: L. POWELL, PHARMD AT 1935 ON 04/28/19 BY C. JESSUP, MT.    Streptococcus species NOT DETECTED NOT DETECTED Final   Streptococcus agalactiae NOT DETECTED NOT DETECTED Final   Streptococcus pneumoniae NOT DETECTED NOT DETECTED Final   Streptococcus pyogenes NOT DETECTED NOT DETECTED Final   Acinetobacter baumannii NOT DETECTED NOT DETECTED Final   Enterobacteriaceae species NOT DETECTED NOT DETECTED Final   Enterobacter cloacae complex NOT DETECTED NOT DETECTED Final   Escherichia coli NOT DETECTED NOT DETECTED Final   Klebsiella oxytoca NOT DETECTED NOT DETECTED Final   Klebsiella pneumoniae NOT DETECTED NOT DETECTED Final   Proteus species NOT DETECTED NOT DETECTED Final   Serratia marcescens NOT DETECTED NOT DETECTED Final   Haemophilus influenzae NOT DETECTED NOT  DETECTED Final   Neisseria meningitidis NOT DETECTED NOT DETECTED Final   Pseudomonas aeruginosa NOT DETECTED NOT DETECTED Final   Candida albicans NOT DETECTED NOT DETECTED Final   Candida glabrata NOT DETECTED NOT DETECTED Final   Candida krusei NOT DETECTED NOT DETECTED Final   Candida parapsilosis NOT DETECTED NOT DETECTED Final   Candida tropicalis NOT DETECTED NOT DETECTED Final    Comment: Performed at [facility], [address], [place], [place] [address]  Culture, blood (Routine x 2)     Status: None (Preliminary result)   Collection Time: 04/27/19 10:55 PM   Specimen: BLOOD  Result Value Ref Range Status   Specimen Description BLOOD RIGHT HAND  Final   Special Requests   Final    BOTTLES DRAWN AEROBIC ONLY Blood Culture results may not be optimal due to an excessive volume of blood received in culture bottles   Culture  Setup Time   Final    YEAST AEROBIC BOTTLE ONLY CRITICAL RESULT CALLED TO, READ BACK BY AND VERIFIED WITH: J. LEDFORD,PHARMD [id number] 05/01/2019 Girtha Hake Performed at [facility], [address] [place], [place] [address]    Culture YEAST  Final   Report Status PENDING  Incomplete  Blood Culture ID Panel (Reflexed)     Status: Abnormal   Collection Time: 04/27/19 10:55 PM  Result Value Ref Range Status   Enterococcus species NOT DETECTED NOT DETECTED Final   Listeria monocytogenes NOT DETECTED NOT DETECTED Final   Staphylococcus species NOT DETECTED NOT DETECTED Final   Staphylococcus aureus (BCID) NOT DETECTED NOT DETECTED Final   Streptococcus species NOT DETECTED NOT DETECTED Final   Streptococcus agalactiae NOT DETECTED NOT DETECTED Final   Streptococcus pneumoniae NOT DETECTED NOT DETECTED Final   Streptococcus pyogenes NOT DETECTED NOT DETECTED Final   Acinetobacter baumannii NOT DETECTED NOT DETECTED Final   Enterobacteriaceae species NOT DETECTED NOT DETECTED Final   Enterobacter cloacae complex NOT DETECTED NOT DETECTED Final    Escherichia coli NOT DETECTED NOT DETECTED Final   Klebsiella oxytoca NOT DETECTED NOT DETECTED Final   Klebsiella pneumoniae NOT DETECTED NOT DETECTED Final   Proteus species NOT DETECTED NOT DETECTED Final   Serratia marcescens NOT DETECTED NOT DETECTED Final   Haemophilus influenzae NOT DETECTED NOT DETECTED Final   Neisseria meningitidis NOT DETECTED NOT DETECTED Final   Pseudomonas aeruginosa NOT DETECTED NOT DETECTED Final   Candida albicans NOT DETECTED NOT DETECTED Final   Candida glabrata DETECTED (A) NOT DETECTED Final    Comment: CRITICAL RESULT CALLED TO, READ BACK BY AND VERIFIED WITH: J. LEDFORD,PHARMD [id number] 05/01/2019 T. Hedwig Morton  Candida krusei NOT DETECTED NOT DETECTED Final   Candida parapsilosis NOT DETECTED NOT DETECTED Final   Candida tropicalis NOT DETECTED NOT DETECTED Final    Comment: Performed at [facility], [place] [address], [place], [place] [address]  SARS CORONAVIRUS 2 (TAT 6-12 HRS) Nasal Swab Aptima Multi Swab     Status: None   Collection Time: 04/28/19  3:21 AM   Specimen: Aptima Multi Swab; Nasal Swab  Result Value Ref Range Status   SARS Coronavirus 2 NEGATIVE NEGATIVE Final    Comment: (NOTE) SARS-CoV-2 target nucleic acids are NOT DETECTED. The SARS-CoV-2 RNA is generally detectable in upper and lower respiratory specimens during the acute phase of infection. Negative results do not preclude SARS-CoV-2 infection, do not rule out co-infections with other pathogens, and should not be used as the sole basis for treatment or other patient management decisions. Negative results must be combined with clinical observations, patient history, and epidemiological information. The expected result is Negative. Fact Sheet for Patients: SugarRoll.be Fact Sheet for Healthcare Providers: https://www.woods-mathews.com/ This test is not yet approved or cleared by the [place] FDA and  has been authorized for  detection and/or diagnosis of SARS-CoV-2 by FDA under an Emergency Use Authorization (EUA). This EUA will remain  in effect (meaning this test can be used) for the duration of the COVID-19 declaration under Section 56 4(b)(1) of the Act, 21 U.S.C. section 360bbb-3(b)(1), unless the authorization is terminated or revoked sooner. Performed at [facility], [place] [address] [place], [place] [address]   SARS Coronavirus 2 [facility] order, Performed in [facility] hospital lab) Nasopharyngeal Nasopharyngeal Swab     Status: None   Collection Time: 04/28/19  3:14 PM   Specimen: Nasopharyngeal Swab  Result Value Ref Range Status   SARS Coronavirus 2 NEGATIVE NEGATIVE Final    Comment: (NOTE) If result is NEGATIVE SARS-CoV-2 target nucleic acids are NOT DETECTED. The SARS-CoV-2 RNA is generally detectable in upper and lower  respiratory specimens during the acute phase of infection. The lowest  concentration of SARS-CoV-2 viral copies this assay can detect is 250  copies / mL. A negative result does not preclude SARS-CoV-2 infection  and should not be used as the sole basis for treatment or other  patient management decisions.  A negative result may occur with  improper specimen collection / handling, submission of specimen other  than nasopharyngeal swab, presence of viral mutation(s) within the  areas targeted by this assay, and inadequate number of viral copies  (<250 copies / mL). A negative result must be combined with clinical  observations, patient history, and epidemiological information. If result is POSITIVE SARS-CoV-2 target nucleic acids are DETECTED. The SARS-CoV-2 RNA is generally detectable in upper and lower  respiratory specimens dur ing the acute phase of infection.  Positive  results are indicative of active infection with SARS-CoV-2.  Clinical  correlation with patient history and other diagnostic information is  necessary to determine patient infection status.   Positive results do  not rule out bacterial infection or co-infection with other viruses. If result is PRESUMPTIVE POSTIVE SARS-CoV-2 nucleic acids MAY BE PRESENT.   A presumptive positive result was obtained on the submitted specimen  and confirmed on repeat testing.  While 2019 novel coronavirus  (SARS-CoV-2) nucleic acids may be present in the submitted sample  additional confirmatory testing may be necessary for epidemiological  and / or clinical management purposes  to differentiate between  SARS-CoV-2 and other Sarbecovirus currently known  to infect humans.  If clinically indicated additional testing with an alternate test  methodology [phone]) is advised. The SARS-CoV-2 RNA is generally  detectable in upper and lower respiratory sp ecimens during the acute  phase of infection. The expected result is Negative. Fact Sheet for Patients:  BoilerBrush.com.cy Fact Sheet for Healthcare Providers: https://pope.com/ This test is not yet approved or cleared by the [place] FDA and has been authorized for detection and/or diagnosis of SARS-CoV-2 by FDA under an Emergency Use Authorization (EUA).  This EUA will remain in effect (meaning this test can be used) for the duration of the COVID-19 declaration under Section 564(b)(1) of the Act, 21 U.S.C. section 360bbb-3(b)(1), unless the authorization is terminated or revoked sooner. Performed at [facility], [address] [place], [place] [address]   MRSA PCR Screening     Status: Abnormal   Collection Time: 04/28/19  4:21 PM   Specimen: Nasal Mucosa; Nasopharyngeal  Result Value Ref Range Status   MRSA by PCR POSITIVE (A) NEGATIVE Final    Comment:        The GeneXpert MRSA Assay (FDA approved for NASAL specimens only), is one component of a comprehensive MRSA colonization surveillance program. It is not intended to diagnose MRSA infection nor to guide or monitor  treatment for MRSA infections. CRITICAL RESULT CALLED TO, READ BACK BY AND VERIFIED WITH: RN [facility] IRISH [phone] FCP Performed at [facility], [address] [place], [place] [address]   Aerobic/Anaerobic Culture (surgical/deep wound)     Status: None (Preliminary result)   Collection Time: 04/28/19  8:08 PM   Specimen: Abscess  Result Value Ref Range Status   Specimen Description ABSCESS RIGHT FOREARM  Final   Special Requests PATIENT ON FOLLOWING VANCOMYCIN,ANCEF  Final   Gram Stain   Final    MODERATE WBC PRESENT, PREDOMINANTLY PMN FEW GRAM POSITIVE COCCI Performed at [facility], [address] [place], [place] [address]    Culture   Final    ABUNDANT METHICILLIN RESISTANT STAPHYLOCOCCUS AUREUS NO ANAEROBES ISOLATED; CULTURE IN PROGRESS FOR 5 DAYS    Report Status PENDING  Incomplete   Organism ID, Bacteria METHICILLIN RESISTANT STAPHYLOCOCCUS AUREUS  Final      Susceptibility   Methicillin resistant staphylococcus aureus - MIC*    CIPROFLOXACIN <=0.5 SENSITIVE Sensitive     ERYTHROMYCIN >=8 RESISTANT Resistant     GENTAMICIN <=0.5 SENSITIVE Sensitive     OXACILLIN >=4 RESISTANT Resistant     TETRACYCLINE <=1 SENSITIVE Sensitive     VANCOMYCIN <=0.5 SENSITIVE Sensitive     TRIMETH/SULFA <=10 SENSITIVE Sensitive     CLINDAMYCIN >=8 RESISTANT Resistant     RIFAMPIN <=0.5 SENSITIVE Sensitive     Inducible Clindamycin NEGATIVE Sensitive     * ABUNDANT METHICILLIN RESISTANT STAPHYLOCOCCUS AUREUS  Culture, blood (routine x 2)     Status: None (Preliminary result)   Collection Time: 04/29/19 12:20 PM   Specimen: BLOOD LEFT HAND  Result Value Ref Range Status   Specimen Description BLOOD LEFT HAND  Final   Special Requests   Final    BOTTLES DRAWN AEROBIC ONLY Blood Culture adequate volume   Culture   Final    NO GROWTH 4 DAYS Performed at [facility], [address], [place], [place] [address]    Report Status PENDING  Incomplete  Culture, blood  (routine x 2)     Status: None (Preliminary result)   Collection Time: 04/29/19 12:25  PM   Specimen: BLOOD  Result Value Ref Range Status   Specimen Description BLOOD LEFT ANTECUBITAL  Final   Special Requests   Final    BOTTLES DRAWN AEROBIC ONLY Blood Culture adequate volume   Culture   Final    NO GROWTH 4 DAYS Performed at [facility], [address] [place], [place] [address]    Report Status PENDING  Incomplete    Impression/Plan: Patient is a 33 year old white male IV drug user admitted for MRSA sepsis/fungemia and right forearm abscess, status post debridement.  1.  MRSA sepsis- patient's initial blood cultures were positive for MRSA.  Continue vancomycin dosed for goal trough of 15-20 for now.  TTE and now TEE did not show evidence of endocarditis and repeat blood cultures thus far are showing no growth. If he continues to show improvement with his right forearm abscess and fungemia is simple (without IE), can limit antibiotic treatment to 3 weeks total.  As he now will require an IV antifungal, there is likely limited benefit in pursuing transition to PO zyvox.  His platelet count continues to rebound on today's labs.  2. Fungemia - New growth of Candida glabrata noted, so continue anidulafungin. F/u repeat blood cxs to assess for clearance (thus far unrevealing). As he now has 2 high risk pathogen noted for endocarditis, he underwent TEE today. As TEE showed no evidence of endocarditis, will plan for 2 weeks of anidulafungin with tentative d/c date for anidulafungin is 05/15/2019. Unfortunately, Candida glabrata is often resistant to azoles, so anticipate he will remain an inpt for completion of parenteral antimicrobials..  3. RT forearm abscess -appreciate orthopedics debriding the patient's wound on April 28, 2019.  As significant improvement is now confirmed, will plan to limit vanc arm of treatment to 3 weeks in total from the time of his right forearm debridement, as his  TEE is also unrevealing. Tentative ABX d/c date is 05/19/2019.  4.  IV drug use- HIV, hep B and hep C serologies were all negative in June 2020.  Consider repeating these labs as the patient's IV drug use has been ongoing.  I counseled the patient extensively regarding the relationship between his current infection and his ongoing IV drug use particularly with reuse of needles for multiple injections. Pt had visitor earlier this week that was actively under the influence of illicit substances. Nursing reports concern that he was smoking in his room as well. Monitor closely for further illicit substance use as an inpt given these issues.  Nursing staff was made aware and may need to limit visitors should there be any persistent concerns moving forward.

## 2019-05-04 LAB — COMPREHENSIVE METABOLIC PANEL
ALT: 269 U/L — ABNORMAL HIGH (ref 0–44)
AST: 215 U/L — ABNORMAL HIGH (ref 15–41)
Albumin: 2.6 g/dL — ABNORMAL LOW (ref 3.5–5.0)
Alkaline Phosphatase: 159 U/L — ABNORMAL HIGH (ref 38–126)
Anion gap: 7 (ref 5–15)
BUN: 12 mg/dL (ref 6–20)
CO2: 24 mmol/L (ref 22–32)
Calcium: 8.7 mg/dL — ABNORMAL LOW (ref 8.9–10.3)
Chloride: 108 mmol/L (ref 98–111)
Creatinine, Ser: 0.96 mg/dL (ref 0.61–1.24)
GFR calc Af Amer: >60 mL/min (ref >60)
GFR calc non Af Amer: >60 mL/min (ref >60)
Glucose, Bld: 148 mg/dL — ABNORMAL HIGH (ref 70–99)
Potassium: 4.2 mmol/L (ref 3.5–5.1)
Sodium: 139 mmol/L (ref 135–145)
Total Bilirubin: 0.7 mg/dL (ref 0.3–1.2)
Total Protein: 6.1 g/dL — ABNORMAL LOW (ref 6.5–8.1)

## 2019-05-04 LAB — CBC
HCT: 37.6 % — ABNORMAL LOW (ref 39.0–52.0)
Hemoglobin: 12.1 g/dL — ABNORMAL LOW (ref 13.0–17.0)
MCH: 26.5 pg (ref 26.0–34.0)
MCHC: 32.2 g/dL (ref 30.0–36.0)
MCV: 82.3 fL (ref 80.0–100.0)
Platelets: 291 10*3/uL (ref 150–400)
RBC: 4.57 MIL/uL (ref 4.22–5.81)
RDW: 15.2 % (ref 11.5–15.5)
WBC: 8.3 10*3/uL (ref 4.0–10.5)
nRBC: 0 % (ref 0.0–0.2)

## 2019-05-04 LAB — CULTURE, BLOOD (ROUTINE X 2)
Culture: NO GROWTH
Culture: NO GROWTH
Special Requests: ADEQUATE
Special Requests: ADEQUATE

## 2019-05-04 LAB — AEROBIC/ANAEROBIC CULTURE W GRAM STAIN (SURGICAL/DEEP WOUND)

## 2019-05-04 NOTE — Progress Notes (Addendum)
PROGRESS NOTE  Taeden Geller ZOX:[id number] DOB: Jan 23, 1986 DOA: 04/27/2019 PCP: Patient, [name] Per   LOS: 6 days   Brief Narrative / Interim history: 33 year old male with history of IV drug use, chronic pain, depression with recent suicide attempt was admitted to the hospital on 04/27/2019 due to a right forearm lesion/abscess after using IV drugs.  Orthopedic surgery was consulted and patient underwent incision/drainage in the OR.  His blood cultures showed MRSA as well as Candida glabrata.  ID was consulted  Subjective: No fevers/chills overnight.  No chest pain, shortness of breath.  TEE was negative yesterday  Assessment & Plan: Principal Problem:   Abscess of right forearm Active Problems:   MDD (major depressive disorder), severe (HCC)   Opioid dependence with opioid-induced mood disorder (HCC)   Hyperglycemia   Leucocytosis   LFTs abnormal   MRSA bacteremia   IVDU (intravenous drug user)   Fungemia   Blood culture positive for Candida species   Principal Problem Right forearm abscess, MRSA bacteremia and Candida glabrata positive blood cultures -s/p incision and drainage in the OR, appreciate orthopedic surgery follow-up -ID following, currently on anidulafungin as well as vancomycin, anticipate at least a few weeks of intravenous antibiotics, not patient not a candidate for PICC line -TEE done on 9/4 without evidence of vegetations -ID recommends IV anidulafungin with tentative DC date on 05/15/2019, and for his MRSA sepsis possibly 3-week total antibiotics, for now continue IV and potentially can be transitioned to Zyvox p.o. on discharge to finish the course.  We will need to touch base with ID close to that date.  Patient seems to be agreeable to complete IV antifungals in the hospital.  Total antibiotics for his MRSA bacteremia DC date is 05/19/2019  Active Problems Depression with anxiety/history of suicidal ideation -He just had a stay at behavioral health prior to  this admission.  Currently stable  Elevated LFTs -Concern for department/illicit substance use.  Continue to monitor, slightly increased in the last 2 days but overall stable.?  Related to anidulafungin   Scheduled Meds: . docusate sodium  100 mg Oral BID  . gabapentin  300 mg Oral TID   Continuous Infusions: . anidulafungin 100 mg (05/04/19 1008)  . methocarbamol (ROBAXIN) IV    . vancomycin 1,500 mg (05/03/19 2102)   PRN Meds:.acetaminophen, diphenhydrAMINE, methocarbamol **OR** methocarbamol (ROBAXIN) IV, ondansetron **OR** ondansetron (ZOFRAN) IV, oxyCODONE, polyethylene glycol, sodium chloride flush  DVT prophylaxis: SCDs Code Status: Full code Family Communication: d/w patient  Disposition Plan: TBD, currently needs IV antibiotics   Consultants:   ID  Orthopedic surgery  Cardiology for TEE  Procedures:   2D echo IMPRESSIONS  1. The left ventricle has hyperdynamic systolic function, with an ejection fraction of >65%. The cavity size was normal. Left ventricular diastolic parameters were normal.  2. The aortic valve is tricuspid. No stenosis of the aortic valve.  3. The aortic root is normal in size and structure.  4. The right ventricle has normal systolic function. The cavity was normal. There is no increase in right ventricular wall thickness.  5. No evidence of mitral valve stenosis. No mitral regurgitation noted.  6. Normal IVC size. No complete TR doppler jet so unable to estimate PA systolic pressure.  7. No valvular vegetation noted.   TEE  Antimicrobials:  Vancomycin 8/28  Anidulafungin 9/2  Objective: Vitals:   05/03/19 1528 05/03/19 2007 05/03/19 2008 05/04/19 0622  BP: 126/81 133/83 136/85 122/79  Pulse: (!) 56 78 73 (!) 56  Resp: 18 18 18 18   Temp: 97.8 F (36.6 C) 98.4 F (36.9 C) 98.4 F (36.9 C) 98.4 F (36.9 C)  TempSrc: Oral Oral Oral Oral  SpO2: 100% 100% 100% 100%  Weight:      Height:        Intake/Output Summary (Last 24  hours) at 05/04/2019 1050 Last data filed at 05/04/2019 0900 Gross per 24 hour  Intake 527 ml  Output -  Net 527 ml   Filed Weights   04/27/19 2257 04/27/19 2318 05/03/19 1320  Weight: 75 kg 77.1 kg 77 kg    Examination:  Constitutional: No distress, sleeping when I entered the room Eyes: No scleral icterus ENMT: Moist membranes Respiratory: Clear bilaterally, no wheezing, no crackles, normal respiratory effort Cardiovascular: Regular rate and rhythm, no murmurs.  No peripheral edema Abdomen: Soft, nontender, nondistended, positive bowel sounds Musculoskeletal: no clubbing / cyanosis. Right forearm wrapped Neurologic: No focal deficits, equal strength   Data Reviewed: I have independently reviewed following labs and imaging studies   CBC: Recent Labs  Lab 04/27/19 2253  04/29/19 0800 05/01/19 0654 05/02/19 0450 05/03/19 1549 05/04/19 0832  WBC 16.0*   < > 9.5 6.4 7.4 8.2 8.3  NEUTROABS 12.0*  --   --   --   --   --   --   HGB 12.4*   < > 11.2* 11.9* 10.5* 12.6* 12.1*  HCT 39.5   < > 35.4* 38.3* 33.1* 39.4 37.6*  MCV 84.2   < > 84.7 86.3 84.4 83.1 82.3  PLT 227   < > 71* 165 210 270 291   < > = values in this interval not displayed.   Basic Metabolic Panel: Recent Labs  Lab 04/29/19 0800 05/01/19 1035 05/02/19 0450 05/03/19 1549 05/04/19 0832  NA 137 139 137 137 139  K 4.2 3.6 3.9 4.5 4.2  CL 108 108 104 107 108  CO2 19* 22 24 23 24   GLUCOSE 261* 95 97 93 148*  BUN 8 9 9 12 12   CREATININE 0.79 0.93 0.81 0.85 0.96  CALCIUM 8.3* 8.0* 8.1* 8.5* 8.7*   GFR: Estimated Creatinine Clearance: 118.1 mL/min (by C-G formula based on SCr of 0.96 mg/dL). Liver Function Tests: Recent Labs  Lab 04/29/19 0800 05/01/19 1035 05/02/19 0450 05/03/19 1549 05/04/19 0832  AST 103* 71* 107* 215* 215*  ALT 210* 143* 156* 254* 269*  ALKPHOS 130* 125 138* 167* 159*  BILITOT 0.8 0.7 0.5 1.3* 0.7  PROT 5.7* 5.7* 5.6* 6.3* 6.1*  ALBUMIN 2.5* 2.3* 2.3* 2.7* 2.6*   No results  for input(s): LIPASE, AMYLASE in the last 168 hours. No results for input(s): AMMONIA in the last 168 hours. Coagulation Profile: Recent Labs  Lab 04/27/19 2253  INR 1.1   Cardiac Enzymes: No results for input(s): CKTOTAL, CKMB, CKMBINDEX, TROPONINI in the last 168 hours. BNP (last 3 results) No results for input(s): PROBNP in the last 8760 hours. HbA1C: No results for input(s): HGBA1C in the last 72 hours. CBG: Recent Labs  Lab 04/29/19 1817  GLUCAP 188*   Lipid Profile: No results for input(s): CHOL, HDL, LDLCALC, TRIG, CHOLHDL, LDLDIRECT in the last 72 hours. Thyroid Function Tests: No results for input(s): TSH, T4TOTAL, FREET4, T3FREE, THYROIDAB in the last 72 hours. Anemia Panel: No results for input(s): VITAMINB12, FOLATE, FERRITIN, TIBC, IRON, RETICCTPCT in the last 72 hours. Urine analysis:    Component Value Date/Time   COLORURINE YELLOW 02/09/2011 2210   APPEARANCEUR CLOUDY (A) 02/09/2011 2210  LABSPEC 1.025 02/09/2011 2210   PHURINE 7.0 02/09/2011 2210   GLUCOSEU NEGATIVE 02/09/2011 2210   HGBUR NEGATIVE 02/09/2011 2210   BILIRUBINUR NEGATIVE 02/09/2011 2210   KETONESUR NEGATIVE 02/09/2011 2210   PROTEINUR NEGATIVE 02/09/2011 2210   UROBILINOGEN 1.0 02/09/2011 2210   NITRITE NEGATIVE 02/09/2011 2210   LEUKOCYTESUR NEGATIVE 02/09/2011 2210   Sepsis Labs: Invalid input(s): PROCALCITONIN, LACTICIDVEN  Recent Results (from the past 240 hour(s))  Culture, blood (Routine x 2)     Status: Abnormal (Preliminary result)   Collection Time: 04/27/19 10:45 PM   Specimen: BLOOD RIGHT ARM  Result Value Ref Range Status   Specimen Description BLOOD RIGHT ARM  Final   Special Requests   Final    BOTTLES DRAWN AEROBIC AND ANAEROBIC Blood Culture adequate volume   Culture  Setup Time   Final    GRAM POSITIVE COCCI AEROBIC BOTTLE ONLY CRITICAL RESULT CALLED TO, READ BACK BY AND VERIFIED WITH: L. POWELL, PHARMD AT 1935 ON 04/28/19 BY C. JESSUP, MT. YEAST ANAEROBIC  BOTTLE ONLY CRITICAL RESULT CALLED TO, READ BACK BY AND VERIFIED WITH: J. LEDFORD,PHARMD [id number] 05/01/2019 Girtha Hake. TYSOR Performed at [facility], [address] [place], [place] [address]    Culture (A)  Final    METHICILLIN RESISTANT STAPHYLOCOCCUS AUREUS YEAST    Report Status PENDING  Incomplete   Organism ID, Bacteria METHICILLIN RESISTANT STAPHYLOCOCCUS AUREUS  Final      Susceptibility   Methicillin resistant staphylococcus aureus - MIC*    CIPROFLOXACIN <=0.5 SENSITIVE Sensitive     ERYTHROMYCIN >=8 RESISTANT Resistant     GENTAMICIN <=0.5 SENSITIVE Sensitive     OXACILLIN >=4 RESISTANT Resistant     TETRACYCLINE <=1 SENSITIVE Sensitive     VANCOMYCIN <=0.5 SENSITIVE Sensitive     TRIMETH/SULFA <=10 SENSITIVE Sensitive     CLINDAMYCIN <=0.25 SENSITIVE Sensitive     RIFAMPIN <=0.5 SENSITIVE Sensitive     Inducible Clindamycin NEGATIVE Sensitive     * METHICILLIN RESISTANT STAPHYLOCOCCUS AUREUS  Blood Culture ID Panel (Reflexed)     Status: Abnormal   Collection Time: 04/27/19 10:45 PM  Result Value Ref Range Status   Enterococcus species NOT DETECTED NOT DETECTED Final   Listeria monocytogenes NOT DETECTED NOT DETECTED Final   Staphylococcus species DETECTED (A) NOT DETECTED Final    Comment: CRITICAL RESULT CALLED TO, READ BACK BY AND VERIFIED WITH: L. POWELL, PHARMD AT 1935 ON 04/28/19 BY C. JESSUP, MT.    Staphylococcus aureus (BCID) DETECTED (A) NOT DETECTED Final    Comment: Methicillin (oxacillin)-resistant Staphylococcus aureus (MRSA). MRSA is predictably resistant to beta-lactam antibiotics (except ceftaroline). Preferred therapy is vancomycin unless clinically contraindicated. Patient requires contact precautions if  hospitalized. CRITICAL RESULT CALLED TO, READ BACK BY AND VERIFIED WITH: L. POWELL, PHARMD AT 1935 ON 04/28/19 BY C. JESSUP, MT.    Methicillin resistance DETECTED (A) NOT DETECTED Final    Comment: CRITICAL RESULT CALLED TO, READ BACK BY AND  VERIFIED WITH: L. POWELL, PHARMD AT 1935 ON 04/28/19 BY C. JESSUP, MT.    Streptococcus species NOT DETECTED NOT DETECTED Final   Streptococcus agalactiae NOT DETECTED NOT DETECTED Final   Streptococcus pneumoniae NOT DETECTED NOT DETECTED Final   Streptococcus pyogenes NOT DETECTED NOT DETECTED Final   Acinetobacter baumannii NOT DETECTED NOT DETECTED Final   Enterobacteriaceae species NOT DETECTED NOT DETECTED Final   Enterobacter cloacae complex NOT DETECTED NOT DETECTED Final   Escherichia coli NOT DETECTED NOT DETECTED Final   Klebsiella oxytoca NOT  DETECTED NOT DETECTED Final   Klebsiella pneumoniae NOT DETECTED NOT DETECTED Final   Proteus species NOT DETECTED NOT DETECTED Final   Serratia marcescens NOT DETECTED NOT DETECTED Final   Haemophilus influenzae NOT DETECTED NOT DETECTED Final   Neisseria meningitidis NOT DETECTED NOT DETECTED Final   Pseudomonas aeruginosa NOT DETECTED NOT DETECTED Final   Candida albicans NOT DETECTED NOT DETECTED Final   Candida glabrata NOT DETECTED NOT DETECTED Final   Candida krusei NOT DETECTED NOT DETECTED Final   Candida parapsilosis NOT DETECTED NOT DETECTED Final   Candida tropicalis NOT DETECTED NOT DETECTED Final    Comment: Performed at [facility], [address], [place], [place] [address]  Culture, blood (Routine x 2)     Status: None (Preliminary result)   Collection Time: 04/27/19 10:55 PM   Specimen: BLOOD  Result Value Ref Range Status   Specimen Description BLOOD RIGHT HAND  Final   Special Requests   Final    BOTTLES DRAWN AEROBIC ONLY Blood Culture results may not be optimal due to an excessive volume of blood received in culture bottles   Culture  Setup Time   Final    YEAST AEROBIC BOTTLE ONLY CRITICAL RESULT CALLED TO, READ BACK BY AND VERIFIED WITH: J. LEDFORD,PHARMD [id number] 05/01/2019 Girtha Hake Performed at [facility], [address], [place], [place] [address]    Culture YEAST  Final   Report Status  PENDING  Incomplete  Blood Culture ID Panel (Reflexed)     Status: Abnormal   Collection Time: 04/27/19 10:55 PM  Result Value Ref Range Status   Enterococcus species NOT DETECTED NOT DETECTED Final   Listeria monocytogenes NOT DETECTED NOT DETECTED Final   Staphylococcus species NOT DETECTED NOT DETECTED Final   Staphylococcus aureus (BCID) NOT DETECTED NOT DETECTED Final   Streptococcus species NOT DETECTED NOT DETECTED Final   Streptococcus agalactiae NOT DETECTED NOT DETECTED Final   Streptococcus pneumoniae NOT DETECTED NOT DETECTED Final   Streptococcus pyogenes NOT DETECTED NOT DETECTED Final   Acinetobacter baumannii NOT DETECTED NOT DETECTED Final   Enterobacteriaceae species NOT DETECTED NOT DETECTED Final   Enterobacter cloacae complex NOT DETECTED NOT DETECTED Final   Escherichia coli NOT DETECTED NOT DETECTED Final   Klebsiella oxytoca NOT DETECTED NOT DETECTED Final   Klebsiella pneumoniae NOT DETECTED NOT DETECTED Final   Proteus species NOT DETECTED NOT DETECTED Final   Serratia marcescens NOT DETECTED NOT DETECTED Final   Haemophilus influenzae NOT DETECTED NOT DETECTED Final   Neisseria meningitidis NOT DETECTED NOT DETECTED Final   Pseudomonas aeruginosa NOT DETECTED NOT DETECTED Final   Candida albicans NOT DETECTED NOT DETECTED Final   Candida glabrata DETECTED (A) NOT DETECTED Final    Comment: CRITICAL RESULT CALLED TO, READ BACK BY AND VERIFIED WITH: J. LEDFORD,PHARMD [id number] 05/01/2019 T. TYSOR    Candida krusei NOT DETECTED NOT DETECTED Final   Candida parapsilosis NOT DETECTED NOT DETECTED Final   Candida tropicalis NOT DETECTED NOT DETECTED Final    Comment: Performed at [facility], [address], [place], [place] [address]  SARS CORONAVIRUS 2 (TAT 6-12 HRS) Nasal Swab Aptima Multi Swab     Status: None   Collection Time: 04/28/19  3:21 AM   Specimen: Aptima Multi Swab; Nasal Swab  Result Value Ref Range Status   SARS Coronavirus 2 NEGATIVE  NEGATIVE Final    Comment: (NOTE) SARS-CoV-2 target nucleic acids are NOT DETECTED. The SARS-CoV-2 RNA is generally detectable  in upper and lower respiratory specimens during the acute phase of infection. Negative results do not preclude SARS-CoV-2 infection, do not rule out co-infections with other pathogens, and should not be used as the sole basis for treatment or other patient management decisions. Negative results must be combined with clinical observations, patient history, and epidemiological information. The expected result is Negative. Fact Sheet for Patients: HairSlick.nohttps://www.fda.gov/media/138098/download Fact Sheet for Healthcare Providers: quierodirigir.comhttps://www.fda.gov/media/138095/download This test is not yet approved or cleared by the [place] FDA and  has been authorized for detection and/or diagnosis of SARS-CoV-2 by FDA under an Emergency Use Authorization (EUA). This EUA will remain  in effect (meaning this test can be used) for the duration of the COVID-19 declaration under Section 56 4(b)(1) of the Act, 21 U.S.C. section 360bbb-3(b)(1), unless the authorization is terminated or revoked sooner. Performed at [facility], [address] [place], [place] [address]   SARS Coronavirus 2 [facility](Hospital order, Performed in [facility] hospital lab) Nasopharyngeal Nasopharyngeal Swab     Status: None   Collection Time: 04/28/19  3:14 PM   Specimen: Nasopharyngeal Swab  Result Value Ref Range Status   SARS Coronavirus 2 NEGATIVE NEGATIVE Final    Comment: (NOTE) If result is NEGATIVE SARS-CoV-2 target nucleic acids are NOT DETECTED. The SARS-CoV-2 RNA is generally detectable in upper and lower  respiratory specimens during the acute phase of infection. The lowest  concentration of SARS-CoV-2 viral copies this assay can detect is 250  copies / mL. A negative result does not preclude SARS-CoV-2 infection  and should not be used as the sole basis for treatment or other   patient management decisions.  A negative result may occur with  improper specimen collection / handling, submission of specimen other  than nasopharyngeal swab, presence of viral mutation(s) within the  areas targeted by this assay, and inadequate number of viral copies  (<250 copies / mL). A negative result must be combined with clinical  observations, patient history, and epidemiological information. If result is POSITIVE SARS-CoV-2 target nucleic acids are DETECTED. The SARS-CoV-2 RNA is generally detectable in upper and lower  respiratory specimens dur ing the acute phase of infection.  Positive  results are indicative of active infection with SARS-CoV-2.  Clinical  correlation with patient history and other diagnostic information is  necessary to determine patient infection status.  Positive results do  not rule out bacterial infection or co-infection with other viruses. If result is PRESUMPTIVE POSTIVE SARS-CoV-2 nucleic acids MAY BE PRESENT.   A presumptive positive result was obtained on the submitted specimen  and confirmed on repeat testing.  While 2019 novel coronavirus  (SARS-CoV-2) nucleic acids may be present in the submitted sample  additional confirmatory testing may be necessary for epidemiological  and / or clinical management purposes  to differentiate between  SARS-CoV-2 and other Sarbecovirus currently known to infect humans.  If clinically indicated additional testing with an alternate test  methodology [phone] is advised. The SARS-CoV-2 RNA is generally  detectable in upper and lower respiratory sp ecimens during the acute  phase of infection. The expected result is Negative. Fact Sheet for Patients:  BoilerBrush.com.cyhttps://www.fda.gov/media/136312/download Fact Sheet for Healthcare Providers: https://pope.com/https://www.fda.gov/media/136313/download This test is not yet approved or cleared by the [place] FDA and has been authorized for detection and/or diagnosis of SARS-CoV-2 by  FDA under an Emergency Use Authorization (EUA).  This EUA will remain in effect (meaning this test can be used) for the duration of the COVID-19 declaration under  Section 564(b)(1) of the Act, 21 U.S.C. section 360bbb-3(b)(1), unless the authorization is terminated or revoked sooner. Performed at [facility], [place] [address] [place], [place] [address]   MRSA PCR Screening     Status: Abnormal   Collection Time: 04/28/19  4:21 PM   Specimen: Nasal Mucosa; Nasopharyngeal  Result Value Ref Range Status   MRSA by PCR POSITIVE (A) NEGATIVE Final    Comment:        The GeneXpert MRSA Assay (FDA approved for NASAL specimens only), is one component of a comprehensive MRSA colonization surveillance program. It is not intended to diagnose MRSA infection nor to guide or monitor treatment for MRSA infections. CRITICAL RESULT CALLED TO, READ BACK BY AND VERIFIED WITH: RN [facility] [place] [id number] FCP Performed at [facility], [place] [address] [place], [place] [address]   Aerobic/Anaerobic Culture (surgical/deep wound)     Status: None (Preliminary result)   Collection Time: 04/28/19  8:08 PM   Specimen: Abscess  Result Value Ref Range Status   Specimen Description ABSCESS RIGHT FOREARM  Final   Special Requests PATIENT ON FOLLOWING VANCOMYCIN,ANCEF  Final   Gram Stain   Final    MODERATE WBC PRESENT, PREDOMINANTLY PMN FEW GRAM POSITIVE COCCI Performed at [facility], [place] [address], [place], [place] [address]    Culture   Final    ABUNDANT METHICILLIN RESISTANT STAPHYLOCOCCUS AUREUS NO ANAEROBES ISOLATED; CULTURE IN PROGRESS FOR 5 DAYS    Report Status PENDING  Incomplete   Organism ID, Bacteria METHICILLIN RESISTANT STAPHYLOCOCCUS AUREUS  Final      Susceptibility   Methicillin resistant staphylococcus aureus - MIC*    CIPROFLOXACIN <=0.5 SENSITIVE Sensitive     ERYTHROMYCIN >=8 RESISTANT Resistant     GENTAMICIN <=0.5 SENSITIVE Sensitive     OXACILLIN >=4  RESISTANT Resistant     TETRACYCLINE <=1 SENSITIVE Sensitive     VANCOMYCIN <=0.5 SENSITIVE Sensitive     TRIMETH/SULFA <=10 SENSITIVE Sensitive     CLINDAMYCIN >=8 RESISTANT Resistant     RIFAMPIN <=0.5 SENSITIVE Sensitive     Inducible Clindamycin NEGATIVE Sensitive     * ABUNDANT METHICILLIN RESISTANT STAPHYLOCOCCUS AUREUS  Culture, blood (routine x 2)     Status: None (Preliminary result)   Collection Time: 04/29/19 12:20 PM   Specimen: BLOOD LEFT HAND  Result Value Ref Range Status   Specimen Description BLOOD LEFT HAND  Final   Special Requests   Final    BOTTLES DRAWN AEROBIC ONLY Blood Culture adequate volume   Culture   Final    NO GROWTH 4 DAYS Performed at [facility], [address], [place], [place] [address]    Report Status PENDING  Incomplete  Culture, blood (routine x 2)     Status: None (Preliminary result)   Collection Time: 04/29/19 12:25 PM   Specimen: BLOOD  Result Value Ref Range Status   Specimen Description BLOOD LEFT ANTECUBITAL  Final   Special Requests   Final    BOTTLES DRAWN AEROBIC ONLY Blood Culture adequate volume   Culture   Final    NO GROWTH 4 DAYS Performed at [facility], [place] [address], [place], [place] [address]    Report Status PENDING  Incomplete      Radiology Studies: No results found.   Marzetta Board, MD, PhD [facility]  Contact via  [email or web address]  [place] P: [phone] F: [phone]

## 2019-05-04 NOTE — Plan of Care (Signed)

## 2019-05-04 NOTE — Plan of Care (Signed)
  Problem: Pain Managment: Goal: General experience of comfort will improve Outcome: Progressing   Problem: Safety: Goal: Ability to remain free from injury will improve Outcome: Progressing   Problem: Skin Integrity: Goal: Risk for impaired skin integrity will decrease Outcome: Progressing   

## 2019-05-05 ENCOUNTER — Encounter (HOSPITAL_COMMUNITY): Payer: Self-pay | Admitting: Cardiology

## 2019-05-05 LAB — COMPREHENSIVE METABOLIC PANEL
ALT: 312 U/L — ABNORMAL HIGH (ref 0–44)
AST: 231 U/L — ABNORMAL HIGH (ref 15–41)
Albumin: 2.8 g/dL — ABNORMAL LOW (ref 3.5–5.0)
Alkaline Phosphatase: 155 U/L — ABNORMAL HIGH (ref 38–126)
Anion gap: 8 (ref 5–15)
BUN: 8 mg/dL (ref 6–20)
CO2: 22 mmol/L (ref 22–32)
Calcium: 8.9 mg/dL (ref 8.9–10.3)
Chloride: 108 mmol/L (ref 98–111)
Creatinine, Ser: 0.78 mg/dL (ref 0.61–1.24)
GFR calc Af Amer: >60 mL/min (ref >60)
GFR calc non Af Amer: >60 mL/min (ref >60)
Glucose, Bld: 117 mg/dL — ABNORMAL HIGH (ref 70–99)
Potassium: 4.4 mmol/L (ref 3.5–5.1)
Sodium: 138 mmol/L (ref 135–145)
Total Bilirubin: 0.6 mg/dL (ref 0.3–1.2)
Total Protein: 6.6 g/dL (ref 6.5–8.1)

## 2019-05-05 LAB — CULTURE, BLOOD (ROUTINE X 2): Special Requests: ADEQUATE

## 2019-05-05 LAB — CBC
HCT: 39.4 % (ref 39.0–52.0)
Hemoglobin: 12.4 g/dL — ABNORMAL LOW (ref 13.0–17.0)
MCH: 26.2 pg (ref 26.0–34.0)
MCHC: 31.5 g/dL (ref 30.0–36.0)
MCV: 83.1 fL (ref 80.0–100.0)
Platelets: 322 10*3/uL (ref 150–400)
RBC: 4.74 MIL/uL (ref 4.22–5.81)
RDW: 15.3 % (ref 11.5–15.5)
WBC: 8.4 10*3/uL (ref 4.0–10.5)
nRBC: 0 % (ref 0.0–0.2)

## 2019-05-05 MED ORDER — OXYCODONE HCL 5 MG PO TABS
5.0000 mg | ORAL_TABLET | ORAL | Status: DC | PRN
  Administered 2019-05-05 – 2019-05-12 (×39): 10 mg via ORAL
  Administered 2019-05-12: 5 mg via ORAL
  Administered 2019-05-13 – 2019-05-17 (×27): 10 mg via ORAL
  Administered 2019-05-17: 5 mg via ORAL
  Administered 2019-05-18 – 2019-05-19 (×10): 10 mg via ORAL
  Filled 2019-05-05 (×78): qty 2

## 2019-05-05 NOTE — Progress Notes (Signed)
Patient refused dressing change to his wound. On-coming RN notified. Will continue to monitor.

## 2019-05-05 NOTE — Progress Notes (Signed)
ID PROGRESS NOTE  Afebrile Labs revealing steady increase in LFTS over last 4 days  Micro: lab will have c.glabrata isolate checked for fluconazole susceptibilties  A/P: - LFTs appeared to be trending up at the time of anti-fungal treatment - agree with dr gherghe's plan to check hep c ab and hep b ab - for now continue with echinocandin for fungemia,  - check LFT tomorrow to see what increase looks like. Currently at 5x ULN - will try to support patient through to complete 2 wk of antifungal treatment  MRSA bacteremia/abscess = continue with 2 wk of vancomycin using 8/31 as day 1  Jon Castro for Infectious Diseases [phone]

## 2019-05-05 NOTE — Progress Notes (Signed)
Pt states the PRN oxy/robaxin did not relieve the pain in his right arm  Informed MD via secure chat  Current PRN medication is q 6 hours, pt not due at this time  Repositioned pt arm, currently elevated on a pillow Will continue to monitor

## 2019-05-05 NOTE — Progress Notes (Addendum)
PROGRESS NOTE  Jon MadeiraKevin Castro ZOX:[id number] DOB: 06/10/1986 DOA: 04/27/2019 PCP: Patient, [name] Per   LOS: 7 days   Brief Narrative / Interim history: 33 year old male with history of IV drug use, chronic pain, depression with recent suicide attempt was admitted to the hospital on 04/27/2019 due to a right forearm lesion/abscess after using IV drugs.  Orthopedic surgery was consulted and patient underwent incision/drainage in the OR.  His blood cultures showed MRSA as well as Candida glabrata.  ID was consulted  Subjective: Doing well, denies any fever or chills  Assessment & Plan: Principal Problem:   Abscess of right forearm Active Problems:   MDD (major depressive disorder), severe (HCC)   Opioid dependence with opioid-induced mood disorder (HCC)   Hyperglycemia   Leucocytosis   LFTs abnormal   MRSA bacteremia   IVDU (intravenous drug user)   Fungemia   Blood culture positive for Candida species   Principal Problem Right forearm abscess, MRSA bacteremia and Candida glabrata positive blood cultures -s/p incision and drainage in the OR, appreciate orthopedic surgery follow-up -ID following, currently on anidulafungin as well as vancomycin, anticipate at least a few weeks of intravenous antibiotics, not patient not a candidate for PICC line -TEE done on 9/4 without evidence of vegetations -ID recommends IV anidulafungin with tentative DC date on 05/15/2019, and for his MRSA sepsis possibly 3-week total antibiotics, for now continue IV and potentially can be transitioned to Zyvox p.o. on discharge to finish the course.  We will need to touch base with ID close to that date.  Patient seems to be agreeable to complete IV antifungals in the hospital.  Total antibiotics for his MRSA bacteremia DC date is 05/19/2019  Active Problems Depression with anxiety/history of suicidal ideation -He just had a stay at behavioral health prior to this admission.  Currently stable  Elevated LFTs  -Concern for department/illicit substance use on admission, however they started to improve recannulizing after initiation of antifungals -We will recheck hepatitis panel, right upper quadrant ultrasound -Discussed with ID today, continue antifungals.  Sensitivities on the Candida glabrata have been asked from the microbiology lab   Scheduled Meds: . docusate sodium  100 mg Oral BID  . gabapentin  300 mg Oral TID   Continuous Infusions: . anidulafungin 100 mg (05/05/19 0859)  . methocarbamol (ROBAXIN) IV    . vancomycin 1,500 mg (05/04/19 2206)   PRN Meds:.acetaminophen, diphenhydrAMINE, methocarbamol **OR** methocarbamol (ROBAXIN) IV, ondansetron **OR** ondansetron (ZOFRAN) IV, oxyCODONE, polyethylene glycol, sodium chloride flush  DVT prophylaxis: SCDs Code Status: Full code Family Communication: d/w patient  Disposition Plan: TBD, currently needs IV antibiotics   Consultants:   ID  Orthopedic surgery  Cardiology for TEE  Procedures:   2D echo IMPRESSIONS  1. The left ventricle has hyperdynamic systolic function, with an ejection fraction of >65%. The cavity size was normal. Left ventricular diastolic parameters were normal.  2. The aortic valve is tricuspid. No stenosis of the aortic valve.  3. The aortic root is normal in size and structure.  4. The right ventricle has normal systolic function. The cavity was normal. There is no increase in right ventricular wall thickness.  5. No evidence of mitral valve stenosis. No mitral regurgitation noted.  6. Normal IVC size. No complete TR doppler jet so unable to estimate PA systolic pressure.  7. No valvular vegetation noted.   TEE  Antimicrobials:  Vancomycin 8/28  Anidulafungin 9/2  Objective: Vitals:   05/04/19 1402 05/04/19 1957 05/05/19 0524 05/05/19 [id number]  BP: 132/80 124/74 120/74 119/72  Pulse: (!) 56 64 63 (!) 55  Resp: 15 16 18 18   Temp: 98.6 F (37 C) 98.4 F (36.9 C) 98.7 F (37.1 C)   TempSrc: Oral  Oral Oral   SpO2: 100% 100% 100% 96%  Weight:      Height:        Intake/Output Summary (Last 24 hours) at 05/05/2019 1123 Last data filed at 05/05/2019 [id number] Gross per 24 hour  Intake 480 ml  Output -  Net 480 ml   Filed Weights   04/27/19 2257 04/27/19 2318 05/03/19 1320  Weight: 75 kg 77.1 kg 77 kg    Examination:  Constitutional: NAD Eyes: No icterus Respiratory: CTA Cardiovascular: Regular rate and rhythm, no murmurs Abdomen: Soft, NT, ND, positive bowel sounds Musculoskeletal: no clubbing / cyanosis.  Right forearm wrapped Neurologic: Nonfocal, equal strength   Data Reviewed: I have independently reviewed following labs and imaging studies   CBC: Recent Labs  Lab 05/01/19 0654 05/02/19 0450 05/03/19 1549 05/04/19 0832 05/05/19 0831  WBC 6.4 7.4 8.2 8.3 8.4  HGB 11.9* 10.5* 12.6* 12.1* 12.4*  HCT 38.3* 33.1* 39.4 37.6* 39.4  MCV 86.3 84.4 83.1 82.3 83.1  PLT 165 210 270 291 322   Basic Metabolic Panel: Recent Labs  Lab 05/01/19 1035 05/02/19 0450 05/03/19 1549 05/04/19 0832 05/05/19 0831  NA 139 137 137 139 138  K 3.6 3.9 4.5 4.2 4.4  CL 108 104 107 108 108  CO2 22 24 23 24 22   GLUCOSE 95 97 93 148* 117*  BUN 9 9 12 12 8   CREATININE 0.93 0.81 0.85 0.96 0.78  CALCIUM 8.0* 8.1* 8.5* 8.7* 8.9   GFR: Estimated Creatinine Clearance: 141.7 mL/min (by C-G formula based on SCr of 0.78 mg/dL). Liver Function Tests: Recent Labs  Lab 05/01/19 1035 05/02/19 0450 05/03/19 1549 05/04/19 0832 05/05/19 0831  AST 71* 107* 215* 215* 231*  ALT 143* 156* 254* 269* 312*  ALKPHOS 125 138* 167* 159* 155*  BILITOT 0.7 0.5 1.3* 0.7 0.6  PROT 5.7* 5.6* 6.3* 6.1* 6.6  ALBUMIN 2.3* 2.3* 2.7* 2.6* 2.8*   No results for input(s): LIPASE, AMYLASE in the last 168 hours. No results for input(s): AMMONIA in the last 168 hours. Coagulation Profile: No results for input(s): INR, PROTIME in the last 168 hours. Cardiac Enzymes: No results for input(s): CKTOTAL, CKMB,  CKMBINDEX, TROPONINI in the last 168 hours. BNP (last 3 results) No results for input(s): PROBNP in the last 8760 hours. HbA1C: No results for input(s): HGBA1C in the last 72 hours. CBG: Recent Labs  Lab 04/29/19 1817  GLUCAP 188*   Lipid Profile: No results for input(s): CHOL, HDL, LDLCALC, TRIG, CHOLHDL, LDLDIRECT in the last 72 hours. Thyroid Function Tests: No results for input(s): TSH, T4TOTAL, FREET4, T3FREE, THYROIDAB in the last 72 hours. Anemia Panel: No results for input(s): VITAMINB12, FOLATE, FERRITIN, TIBC, IRON, RETICCTPCT in the last 72 hours. Urine analysis:    Component Value Date/Time   COLORURINE YELLOW 02/09/2011 2210   APPEARANCEUR CLOUDY (A) 02/09/2011 2210   LABSPEC 1.025 02/09/2011 2210   PHURINE 7.0 02/09/2011 2210   GLUCOSEU NEGATIVE 02/09/2011 2210   HGBUR NEGATIVE 02/09/2011 2210   BILIRUBINUR NEGATIVE 02/09/2011 2210   KETONESUR NEGATIVE 02/09/2011 2210   PROTEINUR NEGATIVE 02/09/2011 2210   UROBILINOGEN 1.0 02/09/2011 2210   NITRITE NEGATIVE 02/09/2011 2210   LEUKOCYTESUR NEGATIVE 02/09/2011 2210   Sepsis Labs: Invalid input(s): PROCALCITONIN, LACTICIDVEN  Recent Results (from the  past 240 hour(s))  Culture, blood (Routine x 2)     Status: Abnormal (Preliminary result)   Collection Time: 04/27/19 10:45 PM   Specimen: BLOOD RIGHT ARM  Result Value Ref Range Status   Specimen Description BLOOD RIGHT ARM  Final   Special Requests   Final    BOTTLES DRAWN AEROBIC AND ANAEROBIC Blood Culture adequate volume   Culture  Setup Time   Final    GRAM POSITIVE COCCI AEROBIC BOTTLE ONLY CRITICAL RESULT CALLED TO, READ BACK BY AND VERIFIED WITH: L. POWELL, PHARMD AT 1935 ON 04/28/19 BY C. JESSUP, MT. YEAST ANAEROBIC BOTTLE ONLY CRITICAL RESULT CALLED TO, READ BACK BY AND VERIFIED WITH: J. LEDFORD,PHARMD [id number] 05/01/2019 Girtha Hake Performed at [facility], [address], [place], [place] [address]    Culture (A)  Final    METHICILLIN RESISTANT  STAPHYLOCOCCUS AUREUS YEAST    Report Status PENDING  Incomplete   Organism ID, Bacteria METHICILLIN RESISTANT STAPHYLOCOCCUS AUREUS  Final      Susceptibility   Methicillin resistant staphylococcus aureus - MIC*    CIPROFLOXACIN <=0.5 SENSITIVE Sensitive     ERYTHROMYCIN >=8 RESISTANT Resistant     GENTAMICIN <=0.5 SENSITIVE Sensitive     OXACILLIN >=4 RESISTANT Resistant     TETRACYCLINE <=1 SENSITIVE Sensitive     VANCOMYCIN <=0.5 SENSITIVE Sensitive     TRIMETH/SULFA <=10 SENSITIVE Sensitive     CLINDAMYCIN <=0.25 SENSITIVE Sensitive     RIFAMPIN <=0.5 SENSITIVE Sensitive     Inducible Clindamycin NEGATIVE Sensitive     * METHICILLIN RESISTANT STAPHYLOCOCCUS AUREUS  Blood Culture ID Panel (Reflexed)     Status: Abnormal   Collection Time: 04/27/19 10:45 PM  Result Value Ref Range Status   Enterococcus species NOT DETECTED NOT DETECTED Final   Listeria monocytogenes NOT DETECTED NOT DETECTED Final   Staphylococcus species DETECTED (A) NOT DETECTED Final    Comment: CRITICAL RESULT CALLED TO, READ BACK BY AND VERIFIED WITH: L. POWELL, PHARMD AT 1935 ON 04/28/19 BY C. JESSUP, MT.    Staphylococcus aureus (BCID) DETECTED (A) NOT DETECTED Final    Comment: Methicillin (oxacillin)-resistant Staphylococcus aureus (MRSA). MRSA is predictably resistant to beta-lactam antibiotics (except ceftaroline). Preferred therapy is vancomycin unless clinically contraindicated. Patient requires contact precautions if  hospitalized. CRITICAL RESULT CALLED TO, READ BACK BY AND VERIFIED WITH: L. POWELL, PHARMD AT 1935 ON 04/28/19 BY C. JESSUP, MT.    Methicillin resistance DETECTED (A) NOT DETECTED Final    Comment: CRITICAL RESULT CALLED TO, READ BACK BY AND VERIFIED WITH: L. POWELL, PHARMD AT 1935 ON 04/28/19 BY C. JESSUP, MT.    Streptococcus species NOT DETECTED NOT DETECTED Final   Streptococcus agalactiae NOT DETECTED NOT DETECTED Final   Streptococcus pneumoniae NOT DETECTED NOT DETECTED Final    Streptococcus pyogenes NOT DETECTED NOT DETECTED Final   Acinetobacter baumannii NOT DETECTED NOT DETECTED Final   Enterobacteriaceae species NOT DETECTED NOT DETECTED Final   Enterobacter cloacae complex NOT DETECTED NOT DETECTED Final   Escherichia coli NOT DETECTED NOT DETECTED Final   Klebsiella oxytoca NOT DETECTED NOT DETECTED Final   Klebsiella pneumoniae NOT DETECTED NOT DETECTED Final   Proteus species NOT DETECTED NOT DETECTED Final   Serratia marcescens NOT DETECTED NOT DETECTED Final   Haemophilus influenzae NOT DETECTED NOT DETECTED Final   Neisseria meningitidis NOT DETECTED NOT DETECTED Final   Pseudomonas aeruginosa NOT DETECTED NOT DETECTED Final   Candida albicans NOT DETECTED NOT DETECTED Final   Candida glabrata  NOT DETECTED NOT DETECTED Final   Candida krusei NOT DETECTED NOT DETECTED Final   Candida parapsilosis NOT DETECTED NOT DETECTED Final   Candida tropicalis NOT DETECTED NOT DETECTED Final    Comment: Performed at [facility], [place] [address], [place], [place] [address]  Culture, blood (Routine x 2)     Status: Abnormal (Preliminary result)   Collection Time: 04/27/19 10:55 PM   Specimen: BLOOD  Result Value Ref Range Status   Specimen Description BLOOD RIGHT HAND  Final   Special Requests   Final    BOTTLES DRAWN AEROBIC ONLY Blood Culture results may not be optimal due to an excessive volume of blood received in culture bottles   Culture  Setup Time   Final    YEAST AEROBIC BOTTLE ONLY CRITICAL RESULT CALLED TO, READ BACK BY AND VERIFIED WITH: J. LEDFORD,PHARMD [id number] 05/01/2019 Mena Goes Performed at [facility], [place] [address] [place], [place] [address]    Culture CANDIDA GLABRATA (A)  Final   Report Status PENDING  Incomplete  Blood Culture ID Panel (Reflexed)     Status: Abnormal   Collection Time: 04/27/19 10:55 PM  Result Value Ref Range Status   Enterococcus species NOT DETECTED NOT DETECTED Final   Listeria monocytogenes NOT  DETECTED NOT DETECTED Final   Staphylococcus species NOT DETECTED NOT DETECTED Final   Staphylococcus aureus (BCID) NOT DETECTED NOT DETECTED Final   Streptococcus species NOT DETECTED NOT DETECTED Final   Streptococcus agalactiae NOT DETECTED NOT DETECTED Final   Streptococcus pneumoniae NOT DETECTED NOT DETECTED Final   Streptococcus pyogenes NOT DETECTED NOT DETECTED Final   Acinetobacter baumannii NOT DETECTED NOT DETECTED Final   Enterobacteriaceae species NOT DETECTED NOT DETECTED Final   Enterobacter cloacae complex NOT DETECTED NOT DETECTED Final   Escherichia coli NOT DETECTED NOT DETECTED Final   Klebsiella oxytoca NOT DETECTED NOT DETECTED Final   Klebsiella pneumoniae NOT DETECTED NOT DETECTED Final   Proteus species NOT DETECTED NOT DETECTED Final   Serratia marcescens NOT DETECTED NOT DETECTED Final   Haemophilus influenzae NOT DETECTED NOT DETECTED Final   Neisseria meningitidis NOT DETECTED NOT DETECTED Final   Pseudomonas aeruginosa NOT DETECTED NOT DETECTED Final   Candida albicans NOT DETECTED NOT DETECTED Final   Candida glabrata DETECTED (A) NOT DETECTED Final    Comment: CRITICAL RESULT CALLED TO, READ BACK BY AND VERIFIED WITH: J. LEDFORD,PHARMD [id number] 05/01/2019 T. TYSOR    Candida krusei NOT DETECTED NOT DETECTED Final   Candida parapsilosis NOT DETECTED NOT DETECTED Final   Candida tropicalis NOT DETECTED NOT DETECTED Final    Comment: Performed at [facility], [address], [place], [place] [address]  SARS CORONAVIRUS 2 (TAT 6-12 HRS) Nasal Swab Aptima Multi Swab     Status: None   Collection Time: 04/28/19  3:21 AM   Specimen: Aptima Multi Swab; Nasal Swab  Result Value Ref Range Status   SARS Coronavirus 2 NEGATIVE NEGATIVE Final    Comment: (NOTE) SARS-CoV-2 target nucleic acids are NOT DETECTED. The SARS-CoV-2 RNA is generally detectable in upper and lower respiratory specimens during the acute phase of infection. Negative results do not  preclude SARS-CoV-2 infection, do not rule out co-infections with other pathogens, and should not be used as the sole basis for treatment or other patient management decisions. Negative results must be combined with clinical observations, patient history, and epidemiological information. The expected result is Negative. Fact Sheet for Patients: SugarRoll.be Fact Sheet for  Healthcare Providers: quierodirigir.com This test is not yet approved or cleared by the [place] and  has been authorized for detection and/or diagnosis of SARS-CoV-2 by FDA under an Emergency Use Authorization (EUA). This EUA will remain  in effect (meaning this test can be used) for the duration of the COVID-19 declaration under Section 56 4(b)(1) of the Act, 21 U.S.C. section 360bbb-3(b)(1), unless the authorization is terminated or revoked sooner. Performed at [facility], [address], [place], [place] [address]   SARS Coronavirus 2 [facility] order, Performed in [facility] hospital lab) Nasopharyngeal Nasopharyngeal Swab     Status: None   Collection Time: 04/28/19  3:14 PM   Specimen: Nasopharyngeal Swab  Result Value Ref Range Status   SARS Coronavirus 2 NEGATIVE NEGATIVE Final    Comment: (NOTE) If result is NEGATIVE SARS-CoV-2 target nucleic acids are NOT DETECTED. The SARS-CoV-2 RNA is generally detectable in upper and lower  respiratory specimens during the acute phase of infection. The lowest  concentration of SARS-CoV-2 viral copies this assay can detect is 250  copies / mL. A negative result does not preclude SARS-CoV-2 infection  and should not be used as the sole basis for treatment or other  patient management decisions.  A negative result may occur with  improper specimen collection / handling, submission of specimen other  than nasopharyngeal swab, presence of viral mutation(s) within the  areas targeted by this assay, and  inadequate number of viral copies  (<250 copies / mL). A negative result must be combined with clinical  observations, patient history, and epidemiological information. If result is POSITIVE SARS-CoV-2 target nucleic acids are DETECTED. The SARS-CoV-2 RNA is generally detectable in upper and lower  respiratory specimens dur ing the acute phase of infection.  Positive  results are indicative of active infection with SARS-CoV-2.  Clinical  correlation with patient history and other diagnostic information is  necessary to determine patient infection status.  Positive results do  not rule out bacterial infection or co-infection with other viruses. If result is PRESUMPTIVE POSTIVE SARS-CoV-2 nucleic acids MAY BE PRESENT.   A presumptive positive result was obtained on the submitted specimen  and confirmed on repeat testing.  While 2019 novel coronavirus  (SARS-CoV-2) nucleic acids may be present in the submitted sample  additional confirmatory testing may be necessary for epidemiological  and / or clinical management purposes  to differentiate between  SARS-CoV-2 and other Sarbecovirus currently known to infect humans.  If clinically indicated additional testing with an alternate test  methodology [phone]) is advised. The SARS-CoV-2 RNA is generally  detectable in upper and lower respiratory sp ecimens during the acute  phase of infection. The expected result is Negative. Fact Sheet for Patients:  BoilerBrush.com.cy Fact Sheet for Healthcare Providers: https://pope.com/ This test is not yet approved or cleared by the [place] FDA and has been authorized for detection and/or diagnosis of SARS-CoV-2 by FDA under an Emergency Use Authorization (EUA).  This EUA will remain in effect (meaning this test can be used) for the duration of the COVID-19 declaration under Section 564(b)(1) of the Act, 21 U.S.C. section 360bbb-3(b)(1), unless the  authorization is terminated or revoked sooner. Performed at [facility], [address], [place], [place] [address]   MRSA PCR Screening     Status: Abnormal   Collection Time: 04/28/19  4:21 PM   Specimen: Nasal Mucosa; Nasopharyngeal  Result Value Ref Range Status   MRSA by PCR POSITIVE (A) NEGATIVE  Final    Comment:        The GeneXpert MRSA Assay (FDA approved for NASAL specimens only), is one component of a comprehensive MRSA colonization surveillance program. It is not intended to diagnose MRSA infection nor to guide or monitor treatment for MRSA infections. CRITICAL RESULT CALLED TO, READ BACK BY AND VERIFIED WITH: RN [facility] IRISH [phone] FCP Performed at [facility], [address] [place], [place] [address]   Aerobic/Anaerobic Culture (surgical/deep wound)     Status: None   Collection Time: 04/28/19  8:08 PM   Specimen: Abscess  Result Value Ref Range Status   Specimen Description ABSCESS RIGHT FOREARM  Final   Special Requests PATIENT ON FOLLOWING VANCOMYCIN,ANCEF  Final   Gram Stain   Final    MODERATE WBC PRESENT, PREDOMINANTLY PMN FEW GRAM POSITIVE COCCI    Culture   Final    ABUNDANT METHICILLIN RESISTANT STAPHYLOCOCCUS AUREUS NO ANAEROBES ISOLATED Performed at [facility], [address], [place], [place] [address]    Report Status 05/04/2019 FINAL  Final   Organism ID, Bacteria METHICILLIN RESISTANT STAPHYLOCOCCUS AUREUS  Final      Susceptibility   Methicillin resistant staphylococcus aureus - MIC*    CIPROFLOXACIN <=0.5 SENSITIVE Sensitive     ERYTHROMYCIN >=8 RESISTANT Resistant     GENTAMICIN <=0.5 SENSITIVE Sensitive     OXACILLIN >=4 RESISTANT Resistant     TETRACYCLINE <=1 SENSITIVE Sensitive     VANCOMYCIN <=0.5 SENSITIVE Sensitive     TRIMETH/SULFA <=10 SENSITIVE Sensitive     CLINDAMYCIN >=8 RESISTANT Resistant     RIFAMPIN <=0.5 SENSITIVE Sensitive     Inducible Clindamycin NEGATIVE Sensitive     * ABUNDANT  METHICILLIN RESISTANT STAPHYLOCOCCUS AUREUS  Culture, blood (routine x 2)     Status: None   Collection Time: 04/29/19 12:20 PM   Specimen: BLOOD LEFT HAND  Result Value Ref Range Status   Specimen Description BLOOD LEFT HAND  Final   Special Requests   Final    BOTTLES DRAWN AEROBIC ONLY Blood Culture adequate volume   Culture   Final    NO GROWTH 5 DAYS Performed at [facility], [address], [place], [place] [address]    Report Status 05/04/2019 FINAL  Final  Culture, blood (routine x 2)     Status: None   Collection Time: 04/29/19 12:25 PM   Specimen: BLOOD  Result Value Ref Range Status   Specimen Description BLOOD LEFT ANTECUBITAL  Final   Special Requests   Final    BOTTLES DRAWN AEROBIC ONLY Blood Culture adequate volume   Culture   Final    NO GROWTH 5 DAYS Performed at [facility], [address] [place], [place] [address]    Report Status 05/04/2019 FINAL  Final      Radiology Studies: No results found.   Pamella Pert, MD, PhD [facility]  Contact via  [email or web address]  [facility] Office Info P: [phone] F: [phone]

## 2019-05-06 ENCOUNTER — Inpatient Hospital Stay (HOSPITAL_COMMUNITY): Payer: Self-pay

## 2019-05-06 DIAGNOSIS — R740 Nonspecific elevation of levels of transaminase and lactic acid dehydrogenase [LDH]: Secondary | ICD-10-CM

## 2019-05-06 LAB — CBC
HCT: 39.4 % (ref 39.0–52.0)
Hemoglobin: 12.4 g/dL — ABNORMAL LOW (ref 13.0–17.0)
MCH: 26.2 pg (ref 26.0–34.0)
MCHC: 31.5 g/dL (ref 30.0–36.0)
MCV: 83.1 fL (ref 80.0–100.0)
Platelets: 329 10*3/uL (ref 150–400)
RBC: 4.74 MIL/uL (ref 4.22–5.81)
RDW: 15.3 % (ref 11.5–15.5)
WBC: 9.7 10*3/uL (ref 4.0–10.5)
nRBC: 0 % (ref 0.0–0.2)

## 2019-05-06 LAB — COMPREHENSIVE METABOLIC PANEL
ALT: 320 U/L — ABNORMAL HIGH (ref 0–44)
AST: 208 U/L — ABNORMAL HIGH (ref 15–41)
Albumin: 2.9 g/dL — ABNORMAL LOW (ref 3.5–5.0)
Alkaline Phosphatase: 173 U/L — ABNORMAL HIGH (ref 38–126)
Anion gap: 9 (ref 5–15)
BUN: 10 mg/dL (ref 6–20)
CO2: 24 mmol/L (ref 22–32)
Calcium: 8.8 mg/dL — ABNORMAL LOW (ref 8.9–10.3)
Chloride: 107 mmol/L (ref 98–111)
Creatinine, Ser: 0.89 mg/dL (ref 0.61–1.24)
GFR calc Af Amer: >60 mL/min (ref >60)
GFR calc non Af Amer: >60 mL/min (ref >60)
Glucose, Bld: 107 mg/dL — ABNORMAL HIGH (ref 70–99)
Potassium: 4.1 mmol/L (ref 3.5–5.1)
Sodium: 140 mmol/L (ref 135–145)
Total Bilirubin: 0.5 mg/dL (ref 0.3–1.2)
Total Protein: 6.5 g/dL (ref 6.5–8.1)

## 2019-05-06 NOTE — Progress Notes (Signed)
PROGRESS NOTE  Jon Castro ZOX:[id number] DOB: 1986-03-25 DOA: 04/27/2019 PCP: Patient, [name] Per   LOS: 8 days   Brief Narrative / Interim history: 33 year old male with history of IV drug use, chronic pain, depression with recent suicide attempt was admitted to the hospital on 04/27/2019 due to a right forearm lesion/abscess after using IV drugs.  Orthopedic surgery was consulted and patient underwent incision/drainage in the OR.  His blood cultures showed MRSA as well as Candida glabrata.  ID was consulted  Subjective: Doing well, denies any fever or chills  Assessment & Plan: Principal Problem:   Abscess of right forearm Active Problems:   MDD (major depressive disorder), severe (HCC)   Opioid dependence with opioid-induced mood disorder (HCC)   Hyperglycemia   Leucocytosis   LFTs abnormal   MRSA bacteremia   IVDU (intravenous drug user)   Fungemia   Blood culture positive for Candida species   Principal Problem Right forearm abscess, MRSA bacteremia and Candida glabrata positive blood cultures -s/p incision and drainage in the OR, appreciate orthopedic surgery follow-up -ID following, currently on anidulafungin as well as vancomycin, anticipate at least a few weeks of intravenous antibiotics, not patient not a candidate for PICC line -TEE done on 9/4 without evidence of vegetations -ID recommends IV anidulafungin with tentative DC date on 05/15/2019, and for his MRSA sepsis possibly 3-week total antibiotics, for now continue IV and potentially can be transitioned to Zyvox p.o. on discharge to finish the course.  We will need to touch base with ID close to that date.  Patient seems to be agreeable to complete IV antifungals in the hospital.  Total antibiotics for his MRSA bacteremia DC date is 05/19/2019  Active Problems Depression with anxiety/history of suicidal ideation -He just had a stay at behavioral health prior to this admission.  Currently stable  Elevated LFTs  -Concern for department/illicit substance use on admission, however they started to improve recannulizing after initiation of antifungals -Right upper quadrant ultrasound unremarkable, hepatitis panel pending.  LFTs overall stable   Scheduled Meds: . docusate sodium  100 mg Oral BID  . gabapentin  300 mg Oral TID   Continuous Infusions: . anidulafungin 100 mg (05/06/19 0928)  . methocarbamol (ROBAXIN) IV    . vancomycin 1,500 mg (05/06/19 1118)   PRN Meds:.acetaminophen, diphenhydrAMINE, methocarbamol **OR** methocarbamol (ROBAXIN) IV, ondansetron **OR** ondansetron (ZOFRAN) IV, oxyCODONE, polyethylene glycol, sodium chloride flush  DVT prophylaxis: SCDs Code Status: Full code Family Communication: d/w patient  Disposition Plan: TBD, currently needs IV antibiotics   Consultants:   ID  Orthopedic surgery  Cardiology for TEE  Procedures:   2D echo IMPRESSIONS  1. The left ventricle has hyperdynamic systolic function, with an ejection fraction of >65%. The cavity size was normal. Left ventricular diastolic parameters were normal.  2. The aortic valve is tricuspid. No stenosis of the aortic valve.  3. The aortic root is normal in size and structure.  4. The right ventricle has normal systolic function. The cavity was normal. There is no increase in right ventricular wall thickness.  5. No evidence of mitral valve stenosis. No mitral regurgitation noted.  6. Normal IVC size. No complete TR doppler jet so unable to estimate PA systolic pressure.  7. No valvular vegetation noted.   TEE  Antimicrobials:  Vancomycin 8/28  Anidulafungin 9/2  Objective: Vitals:   05/05/19 0905 05/05/19 1401 05/05/19 2030 05/06/19 0501  BP: 119/72 124/76 113/65 113/65  Pulse: (!) 55 66 73 77  Resp: 18  17 18 18   Temp:   98.3 F (36.8 C) 97.9 F (36.6 C)  TempSrc:      SpO2: 96%  100% 100%  Weight:      Height:        Intake/Output Summary (Last 24 hours) at 05/06/2019 1219 Last data  filed at 05/06/2019 0865 Gross per 24 hour  Intake 660 ml  Output -  Net 660 ml   Filed Weights   04/27/19 2257 04/27/19 2318 05/03/19 1320  Weight: 75 kg 77.1 kg 77 kg    Examination:  Constitutional: no distress Eyes: no scleral icterus   Data Reviewed: I have independently reviewed following labs and imaging studies   CBC: Recent Labs  Lab 05/02/19 0450 05/03/19 1549 05/04/19 0832 05/05/19 0831 05/06/19 0340  WBC 7.4 8.2 8.3 8.4 9.7  HGB 10.5* 12.6* 12.1* 12.4* 12.4*  HCT 33.1* 39.4 37.6* 39.4 39.4  MCV 84.4 83.1 82.3 83.1 83.1  PLT 210 270 291 322 329   Basic Metabolic Panel: Recent Labs  Lab 05/02/19 0450 05/03/19 1549 05/04/19 0832 05/05/19 0831 05/06/19 0340  NA 137 137 139 138 140  K 3.9 4.5 4.2 4.4 4.1  CL 104 107 108 108 107  CO2 24 23 24 22 24   GLUCOSE 97 93 148* 117* 107*  BUN 9 12 12 8 10   CREATININE 0.81 0.85 0.96 0.78 0.89  CALCIUM 8.1* 8.5* 8.7* 8.9 8.8*   GFR: Estimated Creatinine Clearance: 127.4 mL/min (by C-G formula based on SCr of 0.89 mg/dL). Liver Function Tests: Recent Labs  Lab 05/02/19 0450 05/03/19 1549 05/04/19 0832 05/05/19 0831 05/06/19 0340  AST 107* 215* 215* 231* 208*  ALT 156* 254* 269* 312* 320*  ALKPHOS 138* 167* 159* 155* 173*  BILITOT 0.5 1.3* 0.7 0.6 0.5  PROT 5.6* 6.3* 6.1* 6.6 6.5  ALBUMIN 2.3* 2.7* 2.6* 2.8* 2.9*   No results for input(s): LIPASE, AMYLASE in the last 168 hours. No results for input(s): AMMONIA in the last 168 hours. Coagulation Profile: No results for input(s): INR, PROTIME in the last 168 hours. Cardiac Enzymes: No results for input(s): CKTOTAL, CKMB, CKMBINDEX, TROPONINI in the last 168 hours. BNP (last 3 results) No results for input(s): PROBNP in the last 8760 hours. HbA1C: No results for input(s): HGBA1C in the last 72 hours. CBG: Recent Labs  Lab 04/29/19 1817  GLUCAP 188*   Lipid Profile: No results for input(s): CHOL, HDL, LDLCALC, TRIG, CHOLHDL, LDLDIRECT in the last  72 hours. Thyroid Function Tests: No results for input(s): TSH, T4TOTAL, FREET4, T3FREE, THYROIDAB in the last 72 hours. Anemia Panel: No results for input(s): VITAMINB12, FOLATE, FERRITIN, TIBC, IRON, RETICCTPCT in the last 72 hours. Urine analysis:    Component Value Date/Time   COLORURINE YELLOW 02/09/2011 2210   APPEARANCEUR CLOUDY (A) 02/09/2011 2210   LABSPEC 1.025 02/09/2011 2210   PHURINE 7.0 02/09/2011 2210   GLUCOSEU NEGATIVE 02/09/2011 2210   HGBUR NEGATIVE 02/09/2011 2210   BILIRUBINUR NEGATIVE 02/09/2011 2210   KETONESUR NEGATIVE 02/09/2011 2210   PROTEINUR NEGATIVE 02/09/2011 2210   UROBILINOGEN 1.0 02/09/2011 2210   NITRITE NEGATIVE 02/09/2011 2210   LEUKOCYTESUR NEGATIVE 02/09/2011 2210   Sepsis Labs: Invalid input(s): PROCALCITONIN, LACTICIDVEN  Recent Results (from the past 240 hour(s))  Culture, blood (Routine x 2)     Status: Abnormal   Collection Time: 04/27/19 10:45 PM   Specimen: BLOOD RIGHT ARM  Result Value Ref Range Status   Specimen Description BLOOD RIGHT ARM  Final   Special  Requests   Final    BOTTLES DRAWN AEROBIC AND ANAEROBIC Blood Culture adequate volume   Culture  Setup Time   Final    GRAM POSITIVE COCCI AEROBIC BOTTLE ONLY CRITICAL RESULT CALLED TO, READ BACK BY AND VERIFIED WITH: L. POWELL, PHARMD AT 1935 ON 04/28/19 BY C. JESSUP, MT. YEAST ANAEROBIC BOTTLE ONLY CRITICAL RESULT CALLED TO, READ BACK BY AND VERIFIED WITH: J. LEDFORD,PHARMD [id number] 05/01/2019 Mena Goes Performed at [facility], [place] [address], [place], [place] [address]    Culture (A)  Final    METHICILLIN RESISTANT STAPHYLOCOCCUS AUREUS CANDIDA GLABRATA    Report Status 05/05/2019 FINAL  Final   Organism ID, Bacteria METHICILLIN RESISTANT STAPHYLOCOCCUS AUREUS  Final      Susceptibility   Methicillin resistant staphylococcus aureus - MIC*    CIPROFLOXACIN <=0.5 SENSITIVE Sensitive     ERYTHROMYCIN >=8 RESISTANT Resistant     GENTAMICIN <=0.5 SENSITIVE Sensitive      OXACILLIN >=4 RESISTANT Resistant     TETRACYCLINE <=1 SENSITIVE Sensitive     VANCOMYCIN <=0.5 SENSITIVE Sensitive     TRIMETH/SULFA <=10 SENSITIVE Sensitive     CLINDAMYCIN <=0.25 SENSITIVE Sensitive     RIFAMPIN <=0.5 SENSITIVE Sensitive     Inducible Clindamycin NEGATIVE Sensitive     * METHICILLIN RESISTANT STAPHYLOCOCCUS AUREUS  Blood Culture ID Panel (Reflexed)     Status: Abnormal   Collection Time: 04/27/19 10:45 PM  Result Value Ref Range Status   Enterococcus species NOT DETECTED NOT DETECTED Final   Listeria monocytogenes NOT DETECTED NOT DETECTED Final   Staphylococcus species DETECTED (A) NOT DETECTED Final    Comment: CRITICAL RESULT CALLED TO, READ BACK BY AND VERIFIED WITH: L. POWELL, PHARMD AT 1935 ON 04/28/19 BY C. JESSUP, MT.    Staphylococcus aureus (BCID) DETECTED (A) NOT DETECTED Final    Comment: Methicillin (oxacillin)-resistant Staphylococcus aureus (MRSA). MRSA is predictably resistant to beta-lactam antibiotics (except ceftaroline). Preferred therapy is vancomycin unless clinically contraindicated. Patient requires contact precautions if  hospitalized. CRITICAL RESULT CALLED TO, READ BACK BY AND VERIFIED WITH: L. POWELL, PHARMD AT 1935 ON 04/28/19 BY C. JESSUP, MT.    Methicillin resistance DETECTED (A) NOT DETECTED Final    Comment: CRITICAL RESULT CALLED TO, READ BACK BY AND VERIFIED WITH: L. POWELL, PHARMD AT 1935 ON 04/28/19 BY C. JESSUP, MT.    Streptococcus species NOT DETECTED NOT DETECTED Final   Streptococcus agalactiae NOT DETECTED NOT DETECTED Final   Streptococcus pneumoniae NOT DETECTED NOT DETECTED Final   Streptococcus pyogenes NOT DETECTED NOT DETECTED Final   Acinetobacter baumannii NOT DETECTED NOT DETECTED Final   Enterobacteriaceae species NOT DETECTED NOT DETECTED Final   Enterobacter cloacae complex NOT DETECTED NOT DETECTED Final   Escherichia coli NOT DETECTED NOT DETECTED Final   Klebsiella oxytoca NOT DETECTED NOT DETECTED  Final   Klebsiella pneumoniae NOT DETECTED NOT DETECTED Final   Proteus species NOT DETECTED NOT DETECTED Final   Serratia marcescens NOT DETECTED NOT DETECTED Final   Haemophilus influenzae NOT DETECTED NOT DETECTED Final   Neisseria meningitidis NOT DETECTED NOT DETECTED Final   Pseudomonas aeruginosa NOT DETECTED NOT DETECTED Final   Candida albicans NOT DETECTED NOT DETECTED Final   Candida glabrata NOT DETECTED NOT DETECTED Final   Candida krusei NOT DETECTED NOT DETECTED Final   Candida parapsilosis NOT DETECTED NOT DETECTED Final   Candida tropicalis NOT DETECTED NOT DETECTED Final    Comment: Performed at [facility], [place]. [address]  [place], [place] [address]  Culture, blood (Routine x 2)     Status: Abnormal (Preliminary result)   Collection Time: 04/27/19 10:55 PM   Specimen: BLOOD  Result Value Ref Range Status   Specimen Description BLOOD RIGHT HAND  Final   Special Requests   Final    BOTTLES DRAWN AEROBIC ONLY Blood Culture results may not be optimal due to an excessive volume of blood received in culture bottles   Culture  Setup Time   Final    YEAST AEROBIC BOTTLE ONLY CRITICAL RESULT CALLED TO, READ BACK BY AND VERIFIED WITH: J. LEDFORD,PHARMD [id number] 05/01/2019 T. TYSOR    Culture (A)  Final    CANDIDA GLABRATA Sent to [facility] for further susceptibility testing. Performed at [facility], [address], [place], [place] [address]    Report Status PENDING  Incomplete  Blood Culture ID Panel (Reflexed)     Status: Abnormal   Collection Time: 04/27/19 10:55 PM  Result Value Ref Range Status   Enterococcus species NOT DETECTED NOT DETECTED Final   Listeria monocytogenes NOT DETECTED NOT DETECTED Final   Staphylococcus species NOT DETECTED NOT DETECTED Final   Staphylococcus aureus (BCID) NOT DETECTED NOT DETECTED Final   Streptococcus species NOT DETECTED NOT DETECTED Final   Streptococcus agalactiae NOT DETECTED NOT DETECTED Final   Streptococcus  pneumoniae NOT DETECTED NOT DETECTED Final   Streptococcus pyogenes NOT DETECTED NOT DETECTED Final   Acinetobacter baumannii NOT DETECTED NOT DETECTED Final   Enterobacteriaceae species NOT DETECTED NOT DETECTED Final   Enterobacter cloacae complex NOT DETECTED NOT DETECTED Final   Escherichia coli NOT DETECTED NOT DETECTED Final   Klebsiella oxytoca NOT DETECTED NOT DETECTED Final   Klebsiella pneumoniae NOT DETECTED NOT DETECTED Final   Proteus species NOT DETECTED NOT DETECTED Final   Serratia marcescens NOT DETECTED NOT DETECTED Final   Haemophilus influenzae NOT DETECTED NOT DETECTED Final   Neisseria meningitidis NOT DETECTED NOT DETECTED Final   Pseudomonas aeruginosa NOT DETECTED NOT DETECTED Final   Candida albicans NOT DETECTED NOT DETECTED Final   Candida glabrata DETECTED (A) NOT DETECTED Final    Comment: CRITICAL RESULT CALLED TO, READ BACK BY AND VERIFIED WITH: J. LEDFORD,PHARMD [id number] 05/01/2019 T. TYSOR    Candida krusei NOT DETECTED NOT DETECTED Final   Candida parapsilosis NOT DETECTED NOT DETECTED Final   Candida tropicalis NOT DETECTED NOT DETECTED Final    Comment: Performed at [facility], [address] [place], [place] [address]  SARS CORONAVIRUS 2 (TAT 6-12 HRS) Nasal Swab Aptima Multi Swab     Status: None   Collection Time: 04/28/19  3:21 AM   Specimen: Aptima Multi Swab; Nasal Swab  Result Value Ref Range Status   SARS Coronavirus 2 NEGATIVE NEGATIVE Final    Comment: (NOTE) SARS-CoV-2 target nucleic acids are NOT DETECTED. The SARS-CoV-2 RNA is generally detectable in upper and lower respiratory specimens during the acute phase of infection. Negative results do not preclude SARS-CoV-2 infection, do not rule out co-infections with other pathogens, and should not be used as the sole basis for treatment or other patient management decisions. Negative results must be combined with clinical observations, patient history, and epidemiological  information. The expected result is Negative. Fact Sheet for Patients: HairSlick.no Fact Sheet for Healthcare Providers: quierodirigir.com This test is not yet approved or cleared by the [place] FDA and  has been authorized for detection and/or diagnosis of SARS-CoV-2 by FDA under an Emergency Use Authorization (EUA).  This EUA will remain  in effect (meaning this test can be used) for the duration of the COVID-19 declaration under Section 56 4(b)(1) of the Act, 21 U.S.C. section 360bbb-3(b)(1), unless the authorization is terminated or revoked sooner. Performed at [facility], [address] [place], [place] [address]   SARS Coronavirus 2 [facility](Hospital order, Performed in [facility] lab) Nasopharyngeal Nasopharyngeal Swab     Status: None   Collection Time: 04/28/19  3:14 PM   Specimen: Nasopharyngeal Swab  Result Value Ref Range Status   SARS Coronavirus 2 NEGATIVE NEGATIVE Final    Comment: (NOTE) If result is NEGATIVE SARS-CoV-2 target nucleic acids are NOT DETECTED. The SARS-CoV-2 RNA is generally detectable in upper and lower  respiratory specimens during the acute phase of infection. The lowest  concentration of SARS-CoV-2 viral copies this assay can detect is 250  copies / mL. A negative result does not preclude SARS-CoV-2 infection  and should not be used as the sole basis for treatment or other  patient management decisions.  A negative result may occur with  improper specimen collection / handling, submission of specimen other  than nasopharyngeal swab, presence of viral mutation(s) within the  areas targeted by this assay, and inadequate number of viral copies  (<250 copies / mL). A negative result must be combined with clinical  observations, patient history, and epidemiological information. If result is POSITIVE SARS-CoV-2 target nucleic acids are DETECTED. The SARS-CoV-2 RNA is generally  detectable in upper and lower  respiratory specimens dur ing the acute phase of infection.  Positive  results are indicative of active infection with SARS-CoV-2.  Clinical  correlation with patient history and other diagnostic information is  necessary to determine patient infection status.  Positive results do  not rule out bacterial infection or co-infection with other viruses. If result is PRESUMPTIVE POSTIVE SARS-CoV-2 nucleic acids MAY BE PRESENT.   A presumptive positive result was obtained on the submitted specimen  and confirmed on repeat testing.  While 2019 novel coronavirus  (SARS-CoV-2) nucleic acids may be present in the submitted sample  additional confirmatory testing may be necessary for epidemiological  and / or clinical management purposes  to differentiate between  SARS-CoV-2 and other Sarbecovirus currently known to infect humans.  If clinically indicated additional testing with an alternate test  methodology [phone]([id number]) is advised. The SARS-CoV-2 RNA is generally  detectable in upper and lower respiratory sp ecimens during the acute  phase of infection. The expected result is Negative. Fact Sheet for Patients:  BoilerBrush.com.cyhttps://www.fda.gov/media/136312/download Fact Sheet for Healthcare Providers: https://pope.com/https://www.fda.gov/media/136313/download This test is not yet approved or cleared by the [place] FDA and has been authorized for detection and/or diagnosis of SARS-CoV-2 by FDA under an Emergency Use Authorization (EUA).  This EUA will remain in effect (meaning this test can be used) for the duration of the COVID-19 declaration under Section 564(b)(1) of the Act, 21 U.S.C. section 360bbb-3(b)(1), unless the authorization is terminated or revoked sooner. Performed at [facility], [address] [place], [place] [address]   MRSA PCR Screening     Status: Abnormal   Collection Time: 04/28/19  4:21 PM   Specimen: Nasal Mucosa; Nasopharyngeal  Result Value Ref Range  Status   MRSA by PCR POSITIVE (A) NEGATIVE Final    Comment:        The GeneXpert MRSA Assay (FDA approved for NASAL specimens only), is one component of a comprehensive MRSA colonization surveillance program. It is not  intended to diagnose MRSA infection nor to guide or monitor treatment for MRSA infections. CRITICAL RESULT CALLED TO, READ BACK BY AND VERIFIED WITH: RN [facility] IRISH [phone] [id number] FCP Performed at [facility], [address] [place], [place] [address]   Aerobic/Anaerobic Culture (surgical/deep wound)     Status: None   Collection Time: 04/28/19  8:08 PM   Specimen: Abscess  Result Value Ref Range Status   Specimen Description ABSCESS RIGHT FOREARM  Final   Special Requests PATIENT ON FOLLOWING VANCOMYCIN,ANCEF  Final   Gram Stain   Final    MODERATE WBC PRESENT, PREDOMINANTLY PMN FEW GRAM POSITIVE COCCI    Culture   Final    ABUNDANT METHICILLIN RESISTANT STAPHYLOCOCCUS AUREUS NO ANAEROBES ISOLATED Performed at [facility], [address] [place], [place] [address]    Report Status 05/04/2019 FINAL  Final   Organism ID, Bacteria METHICILLIN RESISTANT STAPHYLOCOCCUS AUREUS  Final      Susceptibility   Methicillin resistant staphylococcus aureus - MIC*    CIPROFLOXACIN <=0.5 SENSITIVE Sensitive     ERYTHROMYCIN >=8 RESISTANT Resistant     GENTAMICIN <=0.5 SENSITIVE Sensitive     OXACILLIN >=4 RESISTANT Resistant     TETRACYCLINE <=1 SENSITIVE Sensitive     VANCOMYCIN <=0.5 SENSITIVE Sensitive     TRIMETH/SULFA <=10 SENSITIVE Sensitive     CLINDAMYCIN >=8 RESISTANT Resistant     RIFAMPIN <=0.5 SENSITIVE Sensitive     Inducible Clindamycin NEGATIVE Sensitive     * ABUNDANT METHICILLIN RESISTANT STAPHYLOCOCCUS AUREUS  Culture, blood (routine x 2)     Status: None   Collection Time: 04/29/19 12:20 PM   Specimen: BLOOD LEFT HAND  Result Value Ref Range Status   Specimen Description BLOOD LEFT HAND  Final   Special Requests   Final    BOTTLES  DRAWN AEROBIC ONLY Blood Culture adequate volume   Culture   Final    NO GROWTH 5 DAYS Performed at [facility], [address] [place], [place] [address]    Report Status 05/04/2019 FINAL  Final  Culture, blood (routine x 2)     Status: None   Collection Time: 04/29/19 12:25 PM   Specimen: BLOOD  Result Value Ref Range Status   Specimen Description BLOOD LEFT ANTECUBITAL  Final   Special Requests   Final    BOTTLES DRAWN AEROBIC ONLY Blood Culture adequate volume   Culture   Final    NO GROWTH 5 DAYS Performed at [facility], [address] [place], [place] [address]    Report Status 05/04/2019 FINAL  Final      Radiology Studies: Koreas Abdomen Limited Ruq  Result Date: 05/06/2019 CLINICAL DATA:  History of IV drug abuse.  Question ascites. EXAM: ULTRASOUND ABDOMEN LIMITED RIGHT UPPER QUADRANT COMPARISON:  None. FINDINGS: Gallbladder: No gallstones or wall thickening visualized. No sonographic Murphy sign noted by sonographer. Common bile duct: Diameter: 0.3 cm Liver: No focal lesion identified. Within normal limits in parenchymal echogenicity. Portal vein is patent on color Doppler imaging with normal direction of blood flow towards the liver. Other: None. IMPRESSION: Negative for ascites.  Normal exam. Electronically Signed   By: Drusilla Kannerhomas  Dalessio M.D.   On: 05/06/2019 09:52     Pamella Pertostin Gherghe, MD, PhD [facility]  Contact via  [email or web address]  [facility] Office Info P: [phone] F: [phone]

## 2019-05-06 NOTE — Progress Notes (Signed)
[facility]    Date of Admission:  04/27/2019   Total days of antibiotics 10 vanco/day 6 anidula           ID: Jon Castro is a 33 y.o. male with polymicrobial deep tissue infection/abscess of right forearm s/p I x D with secondary MRSA bacteremia/fungemia Principal Problem:   Abscess of right forearm Active Problems:   MDD (major depressive disorder), severe (HCC)   Opioid dependence with opioid-induced mood disorder (HCC)   Hyperglycemia   Leucocytosis   LFTs abnormal   MRSA bacteremia   IVDU (intravenous drug user)   Fungemia   Blood culture positive for Candida species    Subjective: Afebrile.denies n/v/nor abdominal pain no diarrrhea or light colored stool ROS: 12 point ros is negative except for right arm pain  Medications:  . docusate sodium  100 mg Oral BID  . gabapentin  300 mg Oral TID    Objective: Vital signs in last 24 hours: Temp:  [97.9 F (36.6 C)-98.3 F (36.8 C)] 97.9 F (36.6 C) (09/07 0501) Pulse Rate:  [66-77] 77 (09/07 0501) Resp:  [17-18] 18 (09/07 0501) BP: (113-124)/(65-76) 113/65 (09/07 0501) SpO2:  [100 %] 100 % (09/07 0501) Physical Exam  Constitutional: He is oriented to person, place, and time. He appears well-developed and well-nourished. No distress.  HENT:  Mouth/Throat: Oropharynx is clear and moist. No oropharyngeal exudate.  Cardiovascular: Normal rate, regular rhythm and normal heart sounds. Exam reveals no gallop and no friction rub.  No murmur heard.  Pulmonary/Chest: Effort normal and breath sounds normal. No respiratory distress. He has no wheezes.  Abdominal: Soft. Bowel sounds are normal. He exhibits no distension. There is no tenderness.  Ext: right arm -serous drainage on bandage Neurological: He is alert and oriented to person, place, and time.  Skin: Skin is warm and dry. No rash noted. No erythema.  Psychiatric: He has a normal mood and affect. His behavior is normal.    Lab Results  Recent Labs    05/05/19 0831 05/06/19 0340  WBC 8.4 9.7  HGB 12.4* 12.4*  HCT 39.4 39.4  NA 138 140  K 4.4 4.1  CL 108 107  CO2 22 24  BUN 8 10  CREATININE 0.78 0.89   Liver Panel Recent Labs    05/05/19 0831 05/06/19 0340  PROT 6.6 6.5  ALBUMIN 2.8* 2.9*  AST 231* 208*  ALT 312* 320*  ALKPHOS 155* 173*  BILITOT 0.6 0.5    Microbiology: 8/31 blood cx NGTD 8/30 OR cx - MRSA 8/29 - blood cx c.glabrata and MRSA Studies/Results: [place] Abdomen Limited Ruq  Result Date: 05/06/2019 CLINICAL DATA:  History of IV drug abuse.  Question ascites. EXAM: ULTRASOUND ABDOMEN LIMITED RIGHT UPPER QUADRANT COMPARISON:  None. FINDINGS: Gallbladder: No gallstones or wall thickening visualized. No sonographic Murphy sign noted by sonographer. Common bile duct: Diameter: 0.3 cm Liver: No focal lesion identified. Within normal limits in parenchymal echogenicity. Portal vein is patent on color Doppler imaging with normal direction of blood flow towards the liver. Other: None. IMPRESSION: Negative for ascites.  Normal exam. Electronically Signed   By: Drusilla Kannerhomas  Dalessio M.D.   On: 05/06/2019 09:52     Assessment/Plan: Polymicrobial SSTI/ abscess with secondary bacteremia/fungemia = continue on vancomycin, dose per AUC curve and anidulafungin. Continue with wound care recs  MRSA bacteremia, secondary = treat for 14d using 8/31 as day 1. Plan for 14 days since TEE negative for endocarditis  c.glarbrata fungemia,  secondary = lab is checking fluconazole sensitivity which will take a few days to return. In the meantime continue with anidulafungin. Denies any blurry vision. Will plan to treat for 14 day using 9/2 as day 1   transaminitis = could be in part due to anidulafungin though by the records appears it was trending up prior to antifungal starting. It is unchanged from yesterday. No gi complaints. Will plan to try to continue as he tolerates. Hep c and hep b serology pending. U/S of RUQ does not  suggest any obstruction  [facility] Cell: [phone] Pager: [phone]  05/06/2019, 10:50 AM

## 2019-05-06 NOTE — Progress Notes (Signed)
Pharmacy Antibiotic Note  Melanie Pellot is a 33 y.o. male admitted on 04/27/2019 with RUE abcess/cellulitis and MRSA/candida bacteremia.  Pharmacy has been consulted for vancomycin dosing. SCr is currently stable with CrCl > 100 ml/min. afebrile, WBC WNL - I&D 8/29, washout 8/30,  - TEE did not show evidence of endocarditis  Plan: Cont Vanc 1500 mg q12h (Predicted AUC 491.4. Scr 0.89) Eraxis 100mg  IV q24h  9/8 1300 vanc peak, 2130 trough Monitor renal function  Monitor levels as needed   Height: 6\' 1"  (185.4 cm) Weight: 169 lb 12.1 oz (77 kg) IBW/kg (Calculated) : 79.9  Temp (24hrs), Avg:98.1 F (36.7 C), Min:97.9 F (36.6 C), Max:98.3 F (36.8 C)  Recent Labs  Lab 04/30/19 0837 04/30/19 1327  05/02/19 0450 05/03/19 1549 05/04/19 0832 05/05/19 0831 05/06/19 0340  WBC  --   --    < > 7.4 8.2 8.3 8.4 9.7  CREATININE  --   --    < > 0.81 0.85 0.96 0.78 0.89  VANCOTROUGH  --  13*  --   --   --   --   --   --   VANCORANDOM 25  --   --   --   --   --   --   --    < > = values in this interval not displayed.    Estimated Creatinine Clearance: 127.4 mL/min (by C-G formula based on SCr of 0.89 mg/dL).    Allergies  Allergen Reactions  . Vicodin [Hydrocodone-Acetaminophen] Itching  . Codeine Nausea And Vomiting  . Tramadol Other (See Comments)    "messes with head"   Antimicrobials this admission:  8/29 Zosyn x 1  8/30 Vanc >>  9/2: Eraxis>>  Microbiology results:  8/29 BCx: MRSA bacteremia, candida glabrata 8/31: BC x 2: ngtd 8/30 COVID: neg 8/30: MRSA PCR + 8/30 RUE abscess: MRSA 9/6 hep c ab: ordered 9/6 hep b ab: ordered 9/6 HIV: ordered  Gwenlyn Fudge - Student PharmD 05/06/2019 9:23 AM

## 2019-05-06 NOTE — Plan of Care (Signed)
  Problem: Education: Goal: Knowledge of General Education information will improve Description: Including pain rating scale, medication(s)/side effects and non-pharmacologic comfort measures Outcome: Progressing   Problem: Pain Managment: Goal: General experience of comfort will improve Outcome: Progressing   Problem: Skin Integrity: Goal: Risk for impaired skin integrity will decrease Outcome: Progressing   

## 2019-05-07 LAB — VANCOMYCIN, TROUGH: Vancomycin Tr: 13 ug/mL — ABNORMAL LOW (ref 15–20)

## 2019-05-07 LAB — HEPATIC FUNCTION PANEL
ALT: 377 U/L — ABNORMAL HIGH (ref 0–44)
AST: 236 U/L — ABNORMAL HIGH (ref 15–41)
Albumin: 3.3 g/dL — ABNORMAL LOW (ref 3.5–5.0)
Alkaline Phosphatase: 180 U/L — ABNORMAL HIGH (ref 38–126)
Bilirubin, Direct: 0.3 mg/dL — ABNORMAL HIGH (ref 0.0–0.2)
Indirect Bilirubin: 0.6 mg/dL (ref 0.3–0.9)
Total Bilirubin: 0.9 mg/dL (ref 0.3–1.2)
Total Protein: 7.6 g/dL (ref 6.5–8.1)

## 2019-05-07 LAB — HIV ANTIBODY (ROUTINE TESTING W REFLEX): HIV Screen 4th Generation wRfx: NONREACTIVE

## 2019-05-07 LAB — HEPATITIS PANEL, ACUTE
HCV Ab: >11 s/co ratio — ABNORMAL HIGH (ref 0.0–0.9)
Hep A IgM: NEGATIVE
Hep B C IgM: NEGATIVE
Hepatitis B Surface Ag: NEGATIVE

## 2019-05-07 LAB — VANCOMYCIN, PEAK: Vancomycin Pk: 42 ug/mL — ABNORMAL HIGH (ref 30–40)

## 2019-05-07 LAB — HEPATITIS B SURFACE ANTIGEN: Hepatitis B Surface Ag: NEGATIVE

## 2019-05-07 MED ORDER — VANCOMYCIN HCL 10 G IV SOLR
1250.0000 mg | Freq: Two times a day (BID) | INTRAVENOUS | Status: DC
  Administered 2019-05-08 – 2019-05-10 (×5): 1250 mg via INTRAVENOUS
  Filled 2019-05-07 (×10): qty 1250

## 2019-05-07 NOTE — Progress Notes (Signed)
Pharmacy Antibiotic Note  Jon Castro is a 33 y.o. male admitted on 04/27/2019 with RUE abcess/cellulitis and MRSA/candida bacteremia.  Pharmacy has been consulted for vancomycin dosing. SCr is currently stable with CrCl > 100 ml/min. afebrile, WBC WNL - I&D 8/29, washout 8/30 - TEE did not show evidence of endocarditis  9/8 PM update: AUC is elevated at 654, renal function stable  Plan: Dec vancomycin to 1250 mg IV q12h >>New estimated AUC: 543 Eraxis 100mg  IV q24h  Monitor renal function  Monitor levels as needed   Height: 6\' 1"  (185.4 cm) Weight: 169 lb 12.1 oz (77 kg) IBW/kg (Calculated) : 79.9  Temp (24hrs), Avg:98.2 F (36.8 C), Min:98 F (36.7 C), Max:98.5 F (36.9 C)  Recent Labs  Lab 05/02/19 0450 05/03/19 1549 05/04/19 0832 05/05/19 0831 05/06/19 0340 05/07/19 1315 05/07/19 2146  WBC 7.4 8.2 8.3 8.4 9.7  --   --   CREATININE 0.81 0.85 0.96 0.78 0.89  --   --   VANCOTROUGH  --   --   --   --   --   --  13*  VANCOPEAK  --   --   --   --   --  42*  --     Estimated Creatinine Clearance: 127.4 mL/min (by C-G formula based on SCr of 0.89 mg/dL).    Allergies  Allergen Reactions  . Vicodin [Hydrocodone-Acetaminophen] Itching  . Codeine Nausea And Vomiting  . Tramadol Other (See Comments)    "messes with head"   Antimicrobials this admission:  8/29 Zosyn x 1  8/30 Vanc >>  9/2: Eraxis>>  Microbiology results:  8/29 BCx: MRSA bacteremia, candida glabrata 8/31: BC x 2: ngtd 8/30 COVID: neg 8/30: MRSA PCR + 8/30 RUE abscess: MRSA 9/6 hep c ab: ordered 9/6 hep b ab: ordered 9/6 HIV: ordered  Narda Bonds, PharmD, [place] Pharmacist Phone: [phone]

## 2019-05-07 NOTE — Progress Notes (Signed)
PROGRESS NOTE  Janene MadeiraKevin Caputi YQI:[id number] DOB: 29-May-1986 DOA: 04/27/2019 PCP: Patient, [name] Per   LOS: 9 days   Brief Narrative / Interim history: 33 year old male with history of IV drug use, chronic pain, depression with recent suicide attempt was admitted to the hospital on 04/27/2019 due to a right forearm lesion/abscess after using IV drugs.  Orthopedic surgery was consulted and patient underwent incision/drainage in the OR.  His blood cultures showed MRSA as well as Candida glabrata.  ID following  Subjective: No complaints this morning, no abdominal pain, no nausea or vomiting  Assessment & Plan: Principal Problem:   Abscess of right forearm Active Problems:   MDD (major depressive disorder), severe (HCC)   Opioid dependence with opioid-induced mood disorder (HCC)   Hyperglycemia   Leucocytosis   LFTs abnormal   MRSA bacteremia   IVDU (intravenous drug user)   Fungemia   Blood culture positive for Candida species   Principal Problem Right forearm abscess, MRSA bacteremia and Candida glabrata positive blood cultures -s/p incision and drainage in the OR, appreciate orthopedic surgery follow-up -ID following, currently on anidulafungin as well as vancomycin, anticipate at least a few weeks of intravenous antibiotics, not patient not a candidate for PICC line due to substance abuse -TEE done on 9/4 without evidence of vegetations -ID recommends IV anidulafungin with tentative DC date on 05/15/2019, and for his MRSA bacteremia possibly 3-week total antibiotics, for now continue IV and potentially can be transitioned to Zyvox p.o. on discharge to finish the course.  We will need to touch base with ID close to that date.  Patient seems to be agreeable to complete IV antifungals in the hospital.  Total antibiotics for his MRSA bacteremia DC date is 05/19/2019  Active Problems Depression with anxiety/history of suicidal ideation -He just had a stay at behavioral health prior to  this admission.  Currently stable  Elevated LFTs -He has a degree of elevated LFTs on admission, possibly related to substance abuse.  In the subsequent days they started to improve however after initiation of anidulafungin they have been raising again.  Overall they have been stable over the past couple of days, this morning labs are still pending.  Right upper quadrant ultrasound was unremarkable for liver lesions.  Hepatitis panel is pending still  Scheduled Meds:  docusate sodium  100 mg Oral BID   gabapentin  300 mg Oral TID   Continuous Infusions:  anidulafungin 100 mg (05/07/19 1050)   methocarbamol (ROBAXIN) IV     vancomycin 1,500 mg (05/07/19 1024)   PRN Meds:.acetaminophen, diphenhydrAMINE, methocarbamol **OR** methocarbamol (ROBAXIN) IV, ondansetron **OR** ondansetron (ZOFRAN) IV, oxyCODONE, polyethylene glycol, sodium chloride flush  DVT prophylaxis: SCDs Code Status: Full code Family Communication: d/w patient  Disposition Plan: TBD, currently needs IV antibiotics   Consultants:   ID  Orthopedic surgery  Cardiology for TEE  Procedures:   2D echo IMPRESSIONS  1. The left ventricle has hyperdynamic systolic function, with an ejection fraction of >65%. The cavity size was normal. Left ventricular diastolic parameters were normal.  2. The aortic valve is tricuspid. No stenosis of the aortic valve.  3. The aortic root is normal in size and structure.  4. The right ventricle has normal systolic function. The cavity was normal. There is no increase in right ventricular wall thickness.  5. No evidence of mitral valve stenosis. No mitral regurgitation noted.  6. Normal IVC size. No complete TR doppler jet so unable to estimate PA systolic pressure.  7.  No valvular vegetation noted.   TEE  Antimicrobials:  Vancomycin 8/28  Anidulafungin 9/2  Objective: Vitals:   05/06/19 0501 05/06/19 1427 05/06/19 2051 05/07/19 0607  BP: 113/65 (!) 140/95 112/62 108/67    Pulse: 77 81 73 70  Resp: 18 16    Temp: 97.9 F (36.6 C) 98.4 F (36.9 C) 98.5 F (36.9 C) 98.1 F (36.7 C)  TempSrc:  Oral Oral Oral  SpO2: 100% 100% 100% 100%  Weight:      Height:        Intake/Output Summary (Last 24 hours) at 05/07/2019 1210 Last data filed at 05/07/2019 4098 Gross per 24 hour  Intake 2242 ml  Output --  Net 2242 ml   Filed Weights   04/27/19 2257 04/27/19 2318 05/03/19 1320  Weight: 75 kg 77.1 kg 77 kg    Examination:  Constitutional: NAD Eyes: No icterus   Data Reviewed: I have independently reviewed following labs and imaging studies   CBC: Recent Labs  Lab 05/02/19 0450 05/03/19 1549 05/04/19 0832 05/05/19 0831 05/06/19 0340  WBC 7.4 8.2 8.3 8.4 9.7  HGB 10.5* 12.6* 12.1* 12.4* 12.4*  HCT 33.1* 39.4 37.6* 39.4 39.4  MCV 84.4 83.1 82.3 83.1 83.1  PLT 210 270 291 322 329   Basic Metabolic Panel: Recent Labs  Lab 05/02/19 0450 05/03/19 1549 05/04/19 0832 05/05/19 0831 05/06/19 0340  NA 137 137 139 138 140  K 3.9 4.5 4.2 4.4 4.1  CL 104 107 108 108 107  CO2 24 23 24 22 24   GLUCOSE 97 93 148* 117* 107*  BUN 9 12 12 8 10   CREATININE 0.81 0.85 0.96 0.78 0.89  CALCIUM 8.1* 8.5* 8.7* 8.9 8.8*   GFR: Estimated Creatinine Clearance: 127.4 mL/min (by C-G formula based on SCr of 0.89 mg/dL). Liver Function Tests: Recent Labs  Lab 05/02/19 0450 05/03/19 1549 05/04/19 0832 05/05/19 0831 05/06/19 0340  AST 107* 215* 215* 231* 208*  ALT 156* 254* 269* 312* 320*  ALKPHOS 138* 167* 159* 155* 173*  BILITOT 0.5 1.3* 0.7 0.6 0.5  PROT 5.6* 6.3* 6.1* 6.6 6.5  ALBUMIN 2.3* 2.7* 2.6* 2.8* 2.9*   No results for input(s): LIPASE, AMYLASE in the last 168 hours. No results for input(s): AMMONIA in the last 168 hours. Coagulation Profile: No results for input(s): INR, PROTIME in the last 168 hours. Cardiac Enzymes: No results for input(s): CKTOTAL, CKMB, CKMBINDEX, TROPONINI in the last 168 hours. BNP (last 3 results) No results for  input(s): PROBNP in the last 8760 hours. HbA1C: No results for input(s): HGBA1C in the last 72 hours. CBG: No results for input(s): GLUCAP in the last 168 hours. Lipid Profile: No results for input(s): CHOL, HDL, LDLCALC, TRIG, CHOLHDL, LDLDIRECT in the last 72 hours. Thyroid Function Tests: No results for input(s): TSH, T4TOTAL, FREET4, T3FREE, THYROIDAB in the last 72 hours. Anemia Panel: No results for input(s): VITAMINB12, FOLATE, FERRITIN, TIBC, IRON, RETICCTPCT in the last 72 hours. Urine analysis:    Component Value Date/Time   COLORURINE YELLOW 02/09/2011 2210   APPEARANCEUR CLOUDY (A) 02/09/2011 2210   LABSPEC 1.025 02/09/2011 2210   PHURINE 7.0 02/09/2011 2210   GLUCOSEU NEGATIVE 02/09/2011 2210   HGBUR NEGATIVE 02/09/2011 2210   BILIRUBINUR NEGATIVE 02/09/2011 2210   KETONESUR NEGATIVE 02/09/2011 2210   PROTEINUR NEGATIVE 02/09/2011 2210   UROBILINOGEN 1.0 02/09/2011 2210   NITRITE NEGATIVE 02/09/2011 2210   LEUKOCYTESUR NEGATIVE 02/09/2011 2210   Sepsis Labs: Invalid input(s): PROCALCITONIN, LACTICIDVEN  Recent Results (  from the past 240 hour(s))  Culture, blood (Routine x 2)     Status: Abnormal   Collection Time: 04/27/19 10:45 PM   Specimen: BLOOD RIGHT ARM  Result Value Ref Range Status   Specimen Description BLOOD RIGHT ARM  Final   Special Requests   Final    BOTTLES DRAWN AEROBIC AND ANAEROBIC Blood Culture adequate volume   Culture  Setup Time   Final    GRAM POSITIVE COCCI AEROBIC BOTTLE ONLY CRITICAL RESULT CALLED TO, READ BACK BY AND VERIFIED WITH: L. POWELL, PHARMD AT 1935 ON 04/28/19 BY C. JESSUP, MT. YEAST ANAEROBIC BOTTLE ONLY CRITICAL RESULT CALLED TO, READ BACK BY AND VERIFIED WITH: J. LEDFORD,PHARMD 1610 05/01/2019 Girtha Hake Performed at [facility], [address] [place], [place] [address]    Culture (A)  Final    METHICILLIN RESISTANT STAPHYLOCOCCUS AUREUS CANDIDA GLABRATA    Report Status 05/05/2019 FINAL  Final   Organism  ID, Bacteria METHICILLIN RESISTANT STAPHYLOCOCCUS AUREUS  Final      Susceptibility   Methicillin resistant staphylococcus aureus - MIC*    CIPROFLOXACIN <=0.5 SENSITIVE Sensitive     ERYTHROMYCIN >=8 RESISTANT Resistant     GENTAMICIN <=0.5 SENSITIVE Sensitive     OXACILLIN >=4 RESISTANT Resistant     TETRACYCLINE <=1 SENSITIVE Sensitive     VANCOMYCIN <=0.5 SENSITIVE Sensitive     TRIMETH/SULFA <=10 SENSITIVE Sensitive     CLINDAMYCIN <=0.25 SENSITIVE Sensitive     RIFAMPIN <=0.5 SENSITIVE Sensitive     Inducible Clindamycin NEGATIVE Sensitive     * METHICILLIN RESISTANT STAPHYLOCOCCUS AUREUS  Blood Culture ID Panel (Reflexed)     Status: Abnormal   Collection Time: 04/27/19 10:45 PM  Result Value Ref Range Status   Enterococcus species NOT DETECTED NOT DETECTED Final   Listeria monocytogenes NOT DETECTED NOT DETECTED Final   Staphylococcus species DETECTED (A) NOT DETECTED Final    Comment: CRITICAL RESULT CALLED TO, READ BACK BY AND VERIFIED WITH: L. POWELL, PHARMD AT 1935 ON 04/28/19 BY C. JESSUP, MT.    Staphylococcus aureus (BCID) DETECTED (A) NOT DETECTED Final    Comment: Methicillin (oxacillin)-resistant Staphylococcus aureus (MRSA). MRSA is predictably resistant to beta-lactam antibiotics (except ceftaroline). Preferred therapy is vancomycin unless clinically contraindicated. Patient requires contact precautions if  hospitalized. CRITICAL RESULT CALLED TO, READ BACK BY AND VERIFIED WITH: L. POWELL, PHARMD AT 1935 ON 04/28/19 BY C. JESSUP, MT.    Methicillin resistance DETECTED (A) NOT DETECTED Final    Comment: CRITICAL RESULT CALLED TO, READ BACK BY AND VERIFIED WITH: L. POWELL, PHARMD AT 1935 ON 04/28/19 BY C. JESSUP, MT.    Streptococcus species NOT DETECTED NOT DETECTED Final   Streptococcus agalactiae NOT DETECTED NOT DETECTED Final   Streptococcus pneumoniae NOT DETECTED NOT DETECTED Final   Streptococcus pyogenes NOT DETECTED NOT DETECTED Final   Acinetobacter  baumannii NOT DETECTED NOT DETECTED Final   Enterobacteriaceae species NOT DETECTED NOT DETECTED Final   Enterobacter cloacae complex NOT DETECTED NOT DETECTED Final   Escherichia coli NOT DETECTED NOT DETECTED Final   Klebsiella oxytoca NOT DETECTED NOT DETECTED Final   Klebsiella pneumoniae NOT DETECTED NOT DETECTED Final   Proteus species NOT DETECTED NOT DETECTED Final   Serratia marcescens NOT DETECTED NOT DETECTED Final   Haemophilus influenzae NOT DETECTED NOT DETECTED Final   Neisseria meningitidis NOT DETECTED NOT DETECTED Final   Pseudomonas aeruginosa NOT DETECTED NOT DETECTED Final   Candida albicans NOT DETECTED NOT DETECTED Final  Candida glabrata NOT DETECTED NOT DETECTED Final   Candida krusei NOT DETECTED NOT DETECTED Final   Candida parapsilosis NOT DETECTED NOT DETECTED Final   Candida tropicalis NOT DETECTED NOT DETECTED Final    Comment: Performed at [facility], [address] [place], [place] [address]  Culture, blood (Routine x 2)     Status: Abnormal (Preliminary result)   Collection Time: 04/27/19 10:55 PM   Specimen: BLOOD  Result Value Ref Range Status   Specimen Description BLOOD RIGHT HAND  Final   Special Requests   Final    BOTTLES DRAWN AEROBIC ONLY Blood Culture results may not be optimal due to an excessive volume of blood received in culture bottles   Culture  Setup Time   Final    YEAST AEROBIC BOTTLE ONLY CRITICAL RESULT CALLED TO, READ BACK BY AND VERIFIED WITH: J. LEDFORD,PHARMD 1448 05/01/2019 T. TYSOR    Culture (A)  Final    CANDIDA GLABRATA Sent to [facility] for further susceptibility testing. Performed at [facility], [address], [place], [place] [address]    Report Status PENDING  Incomplete  Blood Culture ID Panel (Reflexed)     Status: Abnormal   Collection Time: 04/27/19 10:55 PM  Result Value Ref Range Status   Enterococcus species NOT DETECTED NOT DETECTED Final   Listeria monocytogenes NOT DETECTED NOT  DETECTED Final   Staphylococcus species NOT DETECTED NOT DETECTED Final   Staphylococcus aureus (BCID) NOT DETECTED NOT DETECTED Final   Streptococcus species NOT DETECTED NOT DETECTED Final   Streptococcus agalactiae NOT DETECTED NOT DETECTED Final   Streptococcus pneumoniae NOT DETECTED NOT DETECTED Final   Streptococcus pyogenes NOT DETECTED NOT DETECTED Final   Acinetobacter baumannii NOT DETECTED NOT DETECTED Final   Enterobacteriaceae species NOT DETECTED NOT DETECTED Final   Enterobacter cloacae complex NOT DETECTED NOT DETECTED Final   Escherichia coli NOT DETECTED NOT DETECTED Final   Klebsiella oxytoca NOT DETECTED NOT DETECTED Final   Klebsiella pneumoniae NOT DETECTED NOT DETECTED Final   Proteus species NOT DETECTED NOT DETECTED Final   Serratia marcescens NOT DETECTED NOT DETECTED Final   Haemophilus influenzae NOT DETECTED NOT DETECTED Final   Neisseria meningitidis NOT DETECTED NOT DETECTED Final   Pseudomonas aeruginosa NOT DETECTED NOT DETECTED Final   Candida albicans NOT DETECTED NOT DETECTED Final   Candida glabrata DETECTED (A) NOT DETECTED Final    Comment: CRITICAL RESULT CALLED TO, READ BACK BY AND VERIFIED WITH: J. LEDFORD,PHARMD 1497 05/01/2019 T. TYSOR    Candida krusei NOT DETECTED NOT DETECTED Final   Candida parapsilosis NOT DETECTED NOT DETECTED Final   Candida tropicalis NOT DETECTED NOT DETECTED Final    Comment: Performed at [facility], [address], [place], [place] [address]  SARS CORONAVIRUS 2 (TAT 6-12 HRS) Nasal Swab Aptima Multi Swab     Status: None   Collection Time: 04/28/19  3:21 AM   Specimen: Aptima Multi Swab; Nasal Swab  Result Value Ref Range Status   SARS Coronavirus 2 NEGATIVE NEGATIVE Final    Comment: (NOTE) SARS-CoV-2 target nucleic acids are NOT DETECTED. The SARS-CoV-2 RNA is generally detectable in upper and lower respiratory specimens during the acute phase of infection. Negative results do not preclude  SARS-CoV-2 infection, do not rule out co-infections with other pathogens, and should not be used as the sole basis for treatment or other patient management decisions. Negative results must be combined with clinical observations, patient history, and epidemiological information.  The expected result is Negative. Fact Sheet for Patients: HairSlick.no Fact Sheet for Healthcare Providers: quierodirigir.com This test is not yet approved or cleared by the [place] FDA and  has been authorized for detection and/or diagnosis of SARS-CoV-2 by FDA under an Emergency Use Authorization (EUA). This EUA will remain  in effect (meaning this test can be used) for the duration of the COVID-19 declaration under Section 56 4(b)(1) of the Act, 21 U.S.C. section 360bbb-3(b)(1), unless the authorization is terminated or revoked sooner. Performed at [facility], [address], [place], [place] [address]   SARS Coronavirus 2 [facility] order, Performed in [facility] hospital lab) Nasopharyngeal Nasopharyngeal Swab     Status: None   Collection Time: 04/28/19  3:14 PM   Specimen: Nasopharyngeal Swab  Result Value Ref Range Status   SARS Coronavirus 2 NEGATIVE NEGATIVE Final    Comment: (NOTE) If result is NEGATIVE SARS-CoV-2 target nucleic acids are NOT DETECTED. The SARS-CoV-2 RNA is generally detectable in upper and lower  respiratory specimens during the acute phase of infection. The lowest  concentration of SARS-CoV-2 viral copies this assay can detect is 250  copies / mL. A negative result does not preclude SARS-CoV-2 infection  and should not be used as the sole basis for treatment or other  patient management decisions.  A negative result may occur with  improper specimen collection / handling, submission of specimen other  than nasopharyngeal swab, presence of viral mutation(s) within the  areas targeted by this assay, and  inadequate number of viral copies  (<250 copies / mL). A negative result must be combined with clinical  observations, patient history, and epidemiological information. If result is POSITIVE SARS-CoV-2 target nucleic acids are DETECTED. The SARS-CoV-2 RNA is generally detectable in upper and lower  respiratory specimens dur ing the acute phase of infection.  Positive  results are indicative of active infection with SARS-CoV-2.  Clinical  correlation with patient history and other diagnostic information is  necessary to determine patient infection status.  Positive results do  not rule out bacterial infection or co-infection with other viruses. If result is PRESUMPTIVE POSTIVE SARS-CoV-2 nucleic acids MAY BE PRESENT.   A presumptive positive result was obtained on the submitted specimen  and confirmed on repeat testing.  While 2019 novel coronavirus  (SARS-CoV-2) nucleic acids may be present in the submitted sample  additional confirmatory testing may be necessary for epidemiological  and / or clinical management purposes  to differentiate between  SARS-CoV-2 and other Sarbecovirus currently known to infect humans.  If clinically indicated additional testing with an alternate test  methodology [phone]) is advised. The SARS-CoV-2 RNA is generally  detectable in upper and lower respiratory sp ecimens during the acute  phase of infection. The expected result is Negative. Fact Sheet for Patients:  BoilerBrush.com.cy Fact Sheet for Healthcare Providers: https://pope.com/ This test is not yet approved or cleared by the [place] FDA and has been authorized for detection and/or diagnosis of SARS-CoV-2 by FDA under an Emergency Use Authorization (EUA).  This EUA will remain in effect (meaning this test can be used) for the duration of the COVID-19 declaration under Section 564(b)(1) of the Act, 21 U.S.C. section 360bbb-3(b)(1), unless the  authorization is terminated or revoked sooner. Performed at [facility], [address], [place], [place] [address]   MRSA PCR Screening     Status: Abnormal   Collection Time: 04/28/19  4:21 PM   Specimen: Nasal Mucosa; Nasopharyngeal  Result Value Ref Range Status   MRSA by PCR POSITIVE (A) NEGATIVE Final    Comment:        The GeneXpert MRSA Assay (FDA approved for NASAL specimens only), is one component of a comprehensive MRSA colonization surveillance program. It is not intended to diagnose MRSA infection nor to guide or monitor treatment for MRSA infections. CRITICAL RESULT CALLED TO, READ BACK BY AND VERIFIED WITH: RN [facility] Hat[place] [id number] FCP Performed at [facility], [place] [address], [place], [place] [address]   Aerobic/Anaerobic Culture (surgical/deep wound)     Status: None   Collection Time: 04/28/19  8:08 PM   Specimen: Abscess  Result Value Ref Range Status   Specimen Description ABSCESS RIGHT FOREARM  Final   Special Requests PATIENT ON FOLLOWING VANCOMYCIN,ANCEF  Final   Gram Stain   Final    MODERATE WBC PRESENT, PREDOMINANTLY PMN FEW GRAM POSITIVE COCCI    Culture   Final    ABUNDANT METHICILLIN RESISTANT STAPHYLOCOCCUS AUREUS NO ANAEROBES ISOLATED Performed at [facility], [place] [address] [place], [place] [address]    Report Status 05/04/2019 FINAL  Final   Organism ID, Bacteria METHICILLIN RESISTANT STAPHYLOCOCCUS AUREUS  Final      Susceptibility   Methicillin resistant staphylococcus aureus - MIC*    CIPROFLOXACIN <=0.5 SENSITIVE Sensitive     ERYTHROMYCIN >=8 RESISTANT Resistant     GENTAMICIN <=0.5 SENSITIVE Sensitive     OXACILLIN >=4 RESISTANT Resistant     TETRACYCLINE <=1 SENSITIVE Sensitive     VANCOMYCIN <=0.5 SENSITIVE Sensitive     TRIMETH/SULFA <=10 SENSITIVE Sensitive     CLINDAMYCIN >=8 RESISTANT Resistant     RIFAMPIN <=0.5 SENSITIVE Sensitive     Inducible Clindamycin NEGATIVE Sensitive     * ABUNDANT  METHICILLIN RESISTANT STAPHYLOCOCCUS AUREUS  Culture, blood (routine x 2)     Status: None   Collection Time: 04/29/19 12:20 PM   Specimen: BLOOD LEFT HAND  Result Value Ref Range Status   Specimen Description BLOOD LEFT HAND  Final   Special Requests   Final    BOTTLES DRAWN AEROBIC ONLY Blood Culture adequate volume   Culture   Final    NO GROWTH 5 DAYS Performed at [facility], [address] [place], [place] [address]    Report Status 05/04/2019 FINAL  Final  Culture, blood (routine x 2)     Status: None   Collection Time: 04/29/19 12:25 PM   Specimen: BLOOD  Result Value Ref Range Status   Specimen Description BLOOD LEFT ANTECUBITAL  Final   Special Requests   Final    BOTTLES DRAWN AEROBIC ONLY Blood Culture adequate volume   Culture   Final    NO GROWTH 5 DAYS Performed at [facility], [place] [address], [place], [place] [address]    Report Status 05/04/2019 FINAL  Final      Radiology Studies: US Abdomen Limited Ruq  Result Date: 05/06/2019 CLINICAL DATA:  History of IV drug abuse.  Question ascites. EXAM: ULTRASOUND ABDOMEN LIMITED RIGHT UPPER QUADRANT COMPARISON:  None. FINDINGS: Gallbladder: No gallstones or wall thickening visualized. No sonographic Murphy sign noted by sonographer. Common bile duct: Diameter: 0.3 cm Liver: No focal lesion identified. Within normal limits in parenchymal echogenicity. Portal vein is patent on color Doppler imaging with normal direction of blood flow towards the liver. Other: None. IMPRESSION: Negative for ascites.  Normal exam. Electronically Signed   By: Inge Rise  M.D.   On: 05/06/2019 09:52     Pamella Pertostin Keysha Damewood, MD, PhD [facility]  Contact via  [email or web address]  [facility] Office Info P: [phone] F: [phone]

## 2019-05-08 DIAGNOSIS — R768 Other specified abnormal immunological findings in serum: Secondary | ICD-10-CM

## 2019-05-08 LAB — COMPREHENSIVE METABOLIC PANEL
ALT: 341 U/L — ABNORMAL HIGH (ref 0–44)
AST: 210 U/L — ABNORMAL HIGH (ref 15–41)
Albumin: 3 g/dL — ABNORMAL LOW (ref 3.5–5.0)
Alkaline Phosphatase: 157 U/L — ABNORMAL HIGH (ref 38–126)
Anion gap: 7 (ref 5–15)
BUN: 12 mg/dL (ref 6–20)
CO2: 26 mmol/L (ref 22–32)
Calcium: 9 mg/dL (ref 8.9–10.3)
Chloride: 105 mmol/L (ref 98–111)
Creatinine, Ser: 0.98 mg/dL (ref 0.61–1.24)
GFR calc Af Amer: >60 mL/min (ref >60)
GFR calc non Af Amer: >60 mL/min (ref >60)
Glucose, Bld: 109 mg/dL — ABNORMAL HIGH (ref 70–99)
Potassium: 4.1 mmol/L (ref 3.5–5.1)
Sodium: 138 mmol/L (ref 135–145)
Total Bilirubin: 0.7 mg/dL (ref 0.3–1.2)
Total Protein: 6.7 g/dL (ref 6.5–8.1)

## 2019-05-08 NOTE — Progress Notes (Signed)
[facility]   Reason for visit: Follow up on MRSA sepsis/fungemia with RT forearm abscess  Antibiotics: Vancomycin, day 12 Anidulafungin, day 8  Interval History: appetite remains good. He is continuing to ambulate within room with occasional excursions to vending machine down the hall. Fever curves, WBC trends, cx results, ABX usage, imaging, and OP notes all independently reviewed    Current Facility-Administered Medications:  .  acetaminophen (TYLENOL) tablet 325-650 mg, 325-650 mg, Oral, Q6H PRN, Jake Bathe, MD, 650 mg at 05/01/19 0046 .  anidulafungin (ERAXIS) 100 mg in sodium chloride 0.9 % 100 mL IVPB, 100 mg, Intravenous, Q24H, Skains, Mark C, MD, Last Rate: 78 mL/hr at 05/08/19 1236, 100 mg at 05/08/19 1236 .  diphenhydrAMINE (BENADRYL) 12.5 MG/5ML elixir 12.5-25 mg, 12.5-25 mg, Oral, Q4H PRN, Skains, Mark C, MD .  docusate sodium (COLACE) capsule 100 mg, 100 mg, Oral, BID, Jake Bathe, MD, 100 mg at 05/08/19 1010 .  gabapentin (NEURONTIN) capsule 300 mg, 300 mg, Oral, TID, Jake Bathe, MD, 300 mg at 05/08/19 1010 .  methocarbamol (ROBAXIN) tablet 500 mg, 500 mg, Oral, Q6H PRN, 500 mg at 05/08/19 0128 **OR** methocarbamol (ROBAXIN) 500 mg in dextrose 5 % 50 mL IVPB, 500 mg, Intravenous, Q6H PRN, Skains, Mark C, MD .  ondansetron (ZOFRAN) tablet 4 mg, 4 mg, Oral, Q6H PRN **OR** ondansetron (ZOFRAN) injection 4 mg, 4 mg, Intravenous, Q6H PRN, Skains, Mark C, MD .  oxyCODONE (Oxy IR/ROXICODONE) immediate release tablet 5-10 mg, 5-10 mg, Oral, Q4H PRN, Leatha Gilding, MD, 10 mg at 05/08/19 1115 .  polyethylene glycol (MIRALAX / GLYCOLAX) packet 17 g, 17 g, Oral, Daily PRN, Skains, Mark C, MD .  sodium chloride flush (NS) 0.9 % injection 10-40 mL, 10-40 mL, Intracatheter, PRN, Jake Bathe, MD .  vancomycin (VANCOCIN) 1,250 mg in sodium chloride 0.9 % 250 mL IVPB, 1,250 mg, Intravenous, Q12H, Stevphen Rochester, RPH, Last Rate: 166.7 mL/hr at  05/08/19 1016, 1,250 mg at 05/08/19 1016   Physical Exam:   Vitals:   05/07/19 2203 05/08/19 0459  BP: 125/61 (!) 98/51  Pulse: 89 70  Resp: 16 17  Temp: 98.5 F (36.9 C) 98.5 F (36.9 C)  SpO2: 100% 98%   Physical Exam Gen: pleasant, NAD, A&O x 3 Head: NCAT, no temporal wasting evident EENT: PERRL, EOMI, MMM, adequate dentition Neck: supple, no JVD CV: NRRR, no murmurs appreciated Pulm: CTA bilaterally, mild expiratory wheeze, no retractions Abd: soft, NTND, +BS (hypoactive) Extrems: 1+ RT arm/hand edema, no LE edema, 2+ pulses Skin: multiple tattoos, adequate skin turgor MSK: RT forearm & elbow incisions healing well with stitches intact and no surrounding drainage or erythema Neuro: CN II-XII grossly intact, no focal neurologic deficits appreciated, gait was not assessed, A&Ox 3   Review of Systems:  Review of Systems  Constitutional: Negative for chills, fever and weight loss.  HENT: Negative for congestion, hearing loss, sinus pain and sore throat.   Eyes: Negative for blurred vision, photophobia and discharge.  Respiratory: Negative for cough, hemoptysis and shortness of breath.   Cardiovascular: Negative for chest pain, palpitations, orthopnea and leg swelling.  Gastrointestinal: Negative for abdominal pain, constipation, diarrhea, heartburn, nausea and vomiting.  Genitourinary: Negative for dysuria, flank pain, frequency and urgency.  Musculoskeletal: Positive for joint pain. Negative for back pain and myalgias.       RT arm/elbow  Skin: Negative for itching and rash.  Neurological: Negative for tremors, seizures, weakness  and headaches.  Endo/Heme/Allergies: Negative for polydipsia. Does not bruise/bleed easily.  Psychiatric/Behavioral: Positive for substance abuse. Negative for depression. The patient is nervous/anxious. The patient does not have insomnia.      Lab Results  Component Value Date   WBC 9.7 05/06/2019   HGB 12.4 (L) 05/06/2019   HCT 39.4  05/06/2019   MCV 83.1 05/06/2019   PLT 329 05/06/2019    Lab Results  Component Value Date   CREATININE 0.98 05/08/2019   BUN 12 05/08/2019   NA 138 05/08/2019   K 4.1 05/08/2019   CL 105 05/08/2019   CO2 26 05/08/2019    Lab Results  Component Value Date   ALT 341 (H) 05/08/2019   AST 210 (H) 05/08/2019   ALKPHOS 157 (H) 05/08/2019     Microbiology: Recent Results (from the past 240 hour(s))  SARS Coronavirus 2 [facility](Hospital order, Performed in [facility] hospital lab) Nasopharyngeal Nasopharyngeal Swab     Status: None   Collection Time: 04/28/19  3:14 PM   Specimen: Nasopharyngeal Swab  Result Value Ref Range Status   SARS Coronavirus 2 NEGATIVE NEGATIVE Final    Comment: (NOTE) If result is NEGATIVE SARS-CoV-2 target nucleic acids are NOT DETECTED. The SARS-CoV-2 RNA is generally detectable in upper and lower  respiratory specimens during the acute phase of infection. The lowest  concentration of SARS-CoV-2 viral copies this assay can detect is 250  copies / mL. A negative result does not preclude SARS-CoV-2 infection  and should not be used as the sole basis for treatment or other  patient management decisions.  A negative result may occur with  improper specimen collection / handling, submission of specimen other  than nasopharyngeal swab, presence of viral mutation(s) within the  areas targeted by this assay, and inadequate number of viral copies  (<250 copies / mL). A negative result must be combined with clinical  observations, patient history, and epidemiological information. If result is POSITIVE SARS-CoV-2 target nucleic acids are DETECTED. The SARS-CoV-2 RNA is generally detectable in upper and lower  respiratory specimens dur ing the acute phase of infection.  Positive  results are indicative of active infection with SARS-CoV-2.  Clinical  correlation with patient history and other diagnostic information is  necessary to determine patient infection status.   Positive results do  not rule out bacterial infection or co-infection with other viruses. If result is PRESUMPTIVE POSTIVE SARS-CoV-2 nucleic acids MAY BE PRESENT.   A presumptive positive result was obtained on the submitted specimen  and confirmed on repeat testing.  While 2019 novel coronavirus  (SARS-CoV-2) nucleic acids may be present in the submitted sample  additional confirmatory testing may be necessary for epidemiological  and / or clinical management purposes  to differentiate between  SARS-CoV-2 and other Sarbecovirus currently known to infect humans.  If clinically indicated additional testing with an alternate test  methodology [phone]([id number]) is advised. The SARS-CoV-2 RNA is generally  detectable in upper and lower respiratory sp ecimens during the acute  phase of infection. The expected result is Negative. Fact Sheet for Patients:  BoilerBrush.com.cyhttps://www.fda.gov/media/136312/download Fact Sheet for Healthcare Providers: https://pope.com/https://www.fda.gov/media/136313/download This test is not yet approved or cleared by the [place] FDA and has been authorized for detection and/or diagnosis of SARS-CoV-2 by FDA under an Emergency Use Authorization (EUA).  This EUA will remain in effect (meaning this test can be used) for the duration of the COVID-19 declaration under Section 564(b)(1) of the Act, 21 U.S.C. section 360bbb-3(b)(1), unless the authorization  is terminated or revoked sooner. Performed at [facility], [address] [place], [place] [address]   MRSA PCR Screening     Status: Abnormal   Collection Time: 04/28/19  4:21 PM   Specimen: Nasal Mucosa; Nasopharyngeal  Result Value Ref Range Status   MRSA by PCR POSITIVE (A) NEGATIVE Final    Comment:        The GeneXpert MRSA Assay (FDA approved for NASAL specimens only), is one component of a comprehensive MRSA colonization surveillance program. It is not intended to diagnose MRSA infection nor to guide or monitor  treatment for MRSA infections. CRITICAL RESULT CALLED TO, READ BACK BY AND VERIFIED WITH: RN [facility] IR[facility] [phone] [id number] FCP Performed at [facility], [address] [place], [place] [address]   Aerobic/Anaerobic Culture (surgical/deep wound)     Status: None   Collection Time: 04/28/19  8:08 PM   Specimen: Abscess  Result Value Ref Range Status   Specimen Description ABSCESS RIGHT FOREARM  Final   Special Requests PATIENT ON FOLLOWING VANCOMYCIN,ANCEF  Final   Gram Stain   Final    MODERATE WBC PRESENT, PREDOMINANTLY PMN FEW GRAM POSITIVE COCCI    Culture   Final    ABUNDANT METHICILLIN RESISTANT STAPHYLOCOCCUS AUREUS NO ANAEROBES ISOLATED Performed at [facility], [address] [place], [place] [address]    Report Status 05/04/2019 FINAL  Final   Organism ID, Bacteria METHICILLIN RESISTANT STAPHYLOCOCCUS AUREUS  Final      Susceptibility   Methicillin resistant staphylococcus aureus - MIC*    CIPROFLOXACIN <=0.5 SENSITIVE Sensitive     ERYTHROMYCIN >=8 RESISTANT Resistant     GENTAMICIN <=0.5 SENSITIVE Sensitive     OXACILLIN >=4 RESISTANT Resistant     TETRACYCLINE <=1 SENSITIVE Sensitive     VANCOMYCIN <=0.5 SENSITIVE Sensitive     TRIMETH/SULFA <=10 SENSITIVE Sensitive     CLINDAMYCIN >=8 RESISTANT Resistant     RIFAMPIN <=0.5 SENSITIVE Sensitive     Inducible Clindamycin NEGATIVE Sensitive     * ABUNDANT METHICILLIN RESISTANT STAPHYLOCOCCUS AUREUS  Culture, blood (routine x 2)     Status: None   Collection Time: 04/29/19 12:20 PM   Specimen: BLOOD LEFT HAND  Result Value Ref Range Status   Specimen Description BLOOD LEFT HAND  Final   Special Requests   Final    BOTTLES DRAWN AEROBIC ONLY Blood Culture adequate volume   Culture   Final    NO GROWTH 5 DAYS Performed at [facility], [address] [place], [place] [address]    Report Status 05/04/2019 FINAL  Final  Culture, blood (routine x 2)     Status: None   Collection Time: 04/29/19 12:25  PM   Specimen: BLOOD  Result Value Ref Range Status   Specimen Description BLOOD LEFT ANTECUBITAL  Final   Special Requests   Final    BOTTLES DRAWN AEROBIC ONLY Blood Culture adequate volume   Culture   Final    NO GROWTH 5 DAYS Performed at [facility], [address] [place], [place] [address]    Report Status 05/04/2019 FINAL  Final    Impression/Plan: Patient is a 33 year old white male IV drug user admitted for MRSA sepsis/fungemia and right forearm abscess, status post debridement.  1.  MRSA sepsis- patient's initial blood cultures were positive for MRSA.  Continue vancomycin dosed for goal trough of 15-20 for now.  TTE and now TEE did not show evidence  of endocarditis and repeat blood cultures thus far are showing no growth. If he continues to show improvement with his right forearm abscess and fungemia is simple (without IE), can limit antibiotic treatment to 3 weeks total.  As he now will require an IV antifungal, there is likely limited benefit in pursuing transition to PO zyvox.  His platelet count continues to rebound on recent labs.  2. Fungemia - New growth of Candida glabrata noted, so continue anidulafungin. F/u repeat blood cxs were negative. As he now has 2 high risk pathogen noted for endocarditis, he underwent TEE which showed no evidence of endocarditis, so will plan for 2 weeks of anidulafungin with tentative d/c date for anidulafungin is 05/15/2019. Unfortunately, Candida glabrata is often resistant to azoles, so anticipate he will remain an inpt for completion of parenteral antimicrobials..  3. RT forearm abscess -appreciate orthopedics debriding the patient's wound on April 28, 2019.  As significant improvement is now confirmed, will plan to limit vanc arm of treatment to 3 weeks in total from the time of his right forearm debridement, as his TEE is also unrevealing. Tentative ABX d/c date is 05/19/2019.  4.  IV drug use- HIV, hep B and hep C serologies were all  negative in June 2020. He was found to have rebound transaminitis over the weekend with repeat HCV Ab that are now positive. Will send blood for confirmatory HCV VL and genotype, but clinical picture is most consistent with acute HCV infection at this time rather than medication effect from anidulafungin used to treat his fungemia. He has had slight improvement in his LFTs to the 400 range after peaking in the 500s several days ago, so continue anidulafungin at current dose for completion of tx. I counseled the patient extensively regarding the relationship between his current infection and his ongoing IV drug use particularly with reuse of needles for multiple injections. Pt had visitor last week that was actively under the influence of illicit substances. Nursing reports concern that he was smoking in his room as well. Monitor closely for further illicit substance use as an inpt given these issues.  Nursing staff was made aware and may need to limit visitors should there be any persistent concerns moving forward.

## 2019-05-08 NOTE — Progress Notes (Signed)
PROGRESS NOTE  Janene MadeiraKevin Basford ZOX:[id number] DOB: Apr 26, 1986 DOA: 04/27/2019 PCP: Patient, [name] Per   LOS: 10 days   Brief Narrative / Interim history: 33 year old male with history of IV drug use, chronic pain, depression with recent suicide attempt was admitted to the hospital on 04/27/2019 due to a right forearm lesion/abscess after using IV drugs.  Orthopedic surgery was consulted and patient underwent incision/drainage in the OR.  His blood cultures showed MRSA as well as Candida glabrata.  ID following  Subjective: No acute issues or events overnight, no complaints this morning, no abdominal pain, no nausea or vomiting.  Arm pain is markedly improving, range of motion is also improving.  Assessment & Plan: Principal Problem:   Abscess of right forearm Active Problems:   MDD (major depressive disorder), severe (HCC)   Opioid dependence with opioid-induced mood disorder (HCC)   Hyperglycemia   Leucocytosis   LFTs abnormal   MRSA bacteremia   IVDU (intravenous drug user)   Fungemia   Blood culture positive for Candida species   Right forearm abscess, MRSA bacteremia and Candida glabrata positive blood cultures, POA -s/p incision and drainage in the OR 04/28/2019, appreciate orthopedic surgery follow-up -ID following, currently on anidulafungin as well as vancomycin -TTE/TEE - without evidence of vegetations -ID recommends IV anidulafungin with tentative DC date on 05/15/2019, and for his MRSA bacteremia possibly 3-week total antibiotics, for now continue IV and potentially can be transitioned to Zyvox p.o. on discharge to finish the course.  We will need to touch base with ID close to that date. Total antibiotics for his MRSA bacteremia DC date is 05/19/2019  Depression with anxiety/history of suicidal ideation -He just had a stay at behavioral health prior to this admission. Currently stable  Elevated LFTs -He has a degree of elevated LFTs on admission, possibly related to  substance abuse.  In the subsequent days they started to improve however after initiation of anidulafungin they have been somewhat worsening.  Overall they have been stable over the past few days -Right upper quadrant ultrasound was unremarkable for liver lesions.  -Hepatitis panel remarkable for HCV antibody positive(HCV quant RNA pending) -HIV negative  Scheduled Meds: . docusate sodium  100 mg Oral BID  . gabapentin  300 mg Oral TID   Continuous Infusions: . anidulafungin Stopped (05/07/19 1231)  . methocarbamol (ROBAXIN) IV    . vancomycin     PRN Meds:.acetaminophen, diphenhydrAMINE, methocarbamol **OR** methocarbamol (ROBAXIN) IV, ondansetron **OR** ondansetron (ZOFRAN) IV, oxyCODONE, polyethylene glycol, sodium chloride flush  DVT prophylaxis: SCDs Code Status: Full code Family Communication: d/w patient  Disposition Plan: TBD, currently needs IV antibiotics   Consultants:   ID  Orthopedic surgery  Cardiology for TEE  Procedures:   2D echo IMPRESSIONS  1. The left ventricle has hyperdynamic systolic function, with an ejection fraction of >65%. The cavity size was normal. Left ventricular diastolic parameters were normal.  2. The aortic valve is tricuspid. No stenosis of the aortic valve.  3. The aortic root is normal in size and structure.  4. The right ventricle has normal systolic function. The cavity was normal. There is no increase in right ventricular wall thickness.  5. No evidence of mitral valve stenosis. No mitral regurgitation noted.  6. Normal IVC size. No complete TR doppler jet so unable to estimate PA systolic pressure.  7. No valvular vegetation noted.   TEE 1. The left ventricle has normal systolic function, with an ejection fraction of 60-65%. The cavity size was normal.  No evidence of left ventricular regional wall motion abnormalities.  2. The right ventricle has normal systolc function. The cavity was normal. There is no increase in right  ventricular wall thickness.  3. No evidence of a thrombus present in the left atrial appendage.  4. No mitral valve vegetation visualized.  5. The aortic valve is tricuspid.  6. The aortic root is normal in size and structure.  7. No vegetations, no endocarditis.  Antimicrobials:  Vancomycin 8/28  Anidulafungin 9/2  Objective: Vitals:   05/07/19 0607 05/07/19 1219 05/07/19 2203 05/08/19 0459  BP: 108/67 111/69 125/61 (!) 98/51  Pulse: 70 68 89 70  Resp:  18 16 17   Temp: 98.1 F (36.7 C) 98 F (36.7 C) 98.5 F (36.9 C) 98.5 F (36.9 C)  TempSrc: Oral Oral Oral Oral  SpO2: 100% 98% 100% 98%  Weight:      Height:        Intake/Output Summary (Last 24 hours) at 05/08/2019 0756 Last data filed at 05/08/2019 0129 Gross per 24 hour  Intake 4537.33 ml  Output -  Net 4537.33 ml   Filed Weights   04/27/19 2257 04/27/19 2318 05/03/19 1320  Weight: 75 kg 77.1 kg 77 kg    Examination:  General:  Pleasantly resting in bed, No acute distress. HEENT:  Normocephalic atraumatic.  Sclerae nonicteric, noninjected.  Extraocular movements intact bilaterally. Neck:  Without mass or deformity.  Trachea is midline. Lungs:  Clear to auscultate bilaterally without rhonchi, wheeze, or rales. Heart:  Regular rate and rhythm.  Without murmurs, rubs, or gallops. Abdomen:  Soft, nontender, nondistended.  Without guarding or rebound. Extremities: Right upper extremity erythema improving, bandage clean dry intact. Vascular:  Dorsalis pedis and posterior tibial pulses palpable bilaterally.  Data Reviewed: I have independently reviewed following labs and imaging studies   CBC: Recent Labs  Lab 05/02/19 0450 05/03/19 1549 05/04/19 0832 05/05/19 0831 05/06/19 0340  WBC 7.4 8.2 8.3 8.4 9.7  HGB 10.5* 12.6* 12.1* 12.4* 12.4*  HCT 33.1* 39.4 37.6* 39.4 39.4  MCV 84.4 83.1 82.3 83.1 83.1  PLT 210 270 291 322 785   Basic Metabolic Panel: Recent Labs  Lab 05/03/19 1549 05/04/19 0832 05/05/19  0831 05/06/19 0340 05/08/19 0146  NA 137 139 138 140 138  K 4.5 4.2 4.4 4.1 4.1  CL 107 108 108 107 105  CO2 23 24 22 24 26   GLUCOSE 93 148* 117* 107* 109*  BUN 12 12 8 10 12   CREATININE 0.85 0.96 0.78 0.89 0.98  CALCIUM 8.5* 8.7* 8.9 8.8* 9.0   Liver Function Tests: Recent Labs  Lab 05/04/19 0832 05/05/19 0831 05/06/19 0340 05/07/19 1315 05/08/19 0146  AST 215* 231* 208* 236* 210*  ALT 269* 312* 320* 377* 341*  ALKPHOS 159* 155* 173* 180* 157*  BILITOT 0.7 0.6 0.5 0.9 0.7  PROT 6.1* 6.6 6.5 7.6 6.7  ALBUMIN 2.6* 2.8* 2.9* 3.3* 3.0*   Urine analysis:    Component Value Date/Time   COLORURINE YELLOW 02/09/2011 2210   APPEARANCEUR CLOUDY (A) 02/09/2011 2210   LABSPEC 1.025 02/09/2011 2210   PHURINE 7.0 02/09/2011 2210   GLUCOSEU NEGATIVE 02/09/2011 2210   HGBUR NEGATIVE 02/09/2011 2210   BILIRUBINUR NEGATIVE 02/09/2011 2210   KETONESUR NEGATIVE 02/09/2011 2210   PROTEINUR NEGATIVE 02/09/2011 2210   UROBILINOGEN 1.0 02/09/2011 2210   NITRITE NEGATIVE 02/09/2011 2210   LEUKOCYTESUR NEGATIVE 02/09/2011 2210   Sepsis Labs: Invalid input(s): PROCALCITONIN, LACTICIDVEN  Recent Results (from the past 240 hour(s))  SARS Coronavirus 2 [facility](Hospital order, Performed in [facility] hospital lab) Nasopharyngeal Nasopharyngeal Swab     Status: None   Collection Time: 04/28/19  3:14 PM   Specimen: Nasopharyngeal Swab  Result Value Ref Range Status   SARS Coronavirus 2 NEGATIVE NEGATIVE Final    Comment: (NOTE) If result is NEGATIVE SARS-CoV-2 target nucleic acids are NOT DETECTED. The SARS-CoV-2 RNA is generally detectable in upper and lower  respiratory specimens during the acute phase of infection. The lowest  concentration of SARS-CoV-2 viral copies this assay can detect is 250  copies / mL. A negative result does not preclude SARS-CoV-2 infection  and should not be used as the sole basis for treatment or other  patient management decisions.  A negative result may occur  with  improper specimen collection / handling, submission of specimen other  than nasopharyngeal swab, presence of viral mutation(s) within the  areas targeted by this assay, and inadequate number of viral copies  (<250 copies / mL). A negative result must be combined with clinical  observations, patient history, and epidemiological information. If result is POSITIVE SARS-CoV-2 target nucleic acids are DETECTED. The SARS-CoV-2 RNA is generally detectable in upper and lower  respiratory specimens dur ing the acute phase of infection.  Positive  results are indicative of active infection with SARS-CoV-2.  Clinical  correlation with patient history and other diagnostic information is  necessary to determine patient infection status.  Positive results do  not rule out bacterial infection or co-infection with other viruses. If result is PRESUMPTIVE POSTIVE SARS-CoV-2 nucleic acids MAY BE PRESENT.   A presumptive positive result was obtained on the submitted specimen  and confirmed on repeat testing.  While 2019 novel coronavirus  (SARS-CoV-2) nucleic acids may be present in the submitted sample  additional confirmatory testing may be necessary for epidemiological  and / or clinical management purposes  to differentiate between  SARS-CoV-2 and other Sarbecovirus currently known to infect humans.  If clinically indicated additional testing with an alternate test  methodology [phone]([id number]) is advised. The SARS-CoV-2 RNA is generally  detectable in upper and lower respiratory sp ecimens during the acute  phase of infection. The expected result is Negative. Fact Sheet for Patients:  BoilerBrush.com.cyhttps://www.fda.gov/media/136312/download Fact Sheet for Healthcare Providers: https://pope.com/https://www.fda.gov/media/136313/download This test is not yet approved or cleared by the [place] FDA and has been authorized for detection and/or diagnosis of SARS-CoV-2 by FDA under an Emergency Use Authorization (EUA).  This EUA  will remain in effect (meaning this test can be used) for the duration of the COVID-19 declaration under Section 564(b)(1) of the Act, 21 U.S.C. section 360bbb-3(b)(1), unless the authorization is terminated or revoked sooner. Performed at [facility], [address] [place], [place] [address]   MRSA PCR Screening     Status: Abnormal   Collection Time: 04/28/19  4:21 PM   Specimen: Nasal Mucosa; Nasopharyngeal  Result Value Ref Range Status   MRSA by PCR POSITIVE (A) NEGATIVE Final    Comment:        The GeneXpert MRSA Assay (FDA approved for NASAL specimens only), is one component of a comprehensive MRSA colonization surveillance program. It is not intended to diagnose MRSA infection nor to guide or monitor treatment for MRSA infections. CRITICAL RESULT CALLED TO, READ BACK BY AND VERIFIED WITH: RN [facility] IRISH [phone] [id number] FCP Performed at [facility], [address] [place], [place] [address]   Aerobic/Anaerobic Culture (surgical/deep wound)     Status:  None   Collection Time: 04/28/19  8:08 PM   Specimen: Abscess  Result Value Ref Range Status   Specimen Description ABSCESS RIGHT FOREARM  Final   Special Requests PATIENT ON FOLLOWING VANCOMYCIN,ANCEF  Final   Gram Stain   Final    MODERATE WBC PRESENT, PREDOMINANTLY PMN FEW GRAM POSITIVE COCCI    Culture   Final    ABUNDANT METHICILLIN RESISTANT STAPHYLOCOCCUS AUREUS NO ANAEROBES ISOLATED Performed at [facility], [address] [place], [place] [address]    Report Status 05/04/2019 FINAL  Final   Organism ID, Bacteria METHICILLIN RESISTANT STAPHYLOCOCCUS AUREUS  Final      Susceptibility   Methicillin resistant staphylococcus aureus - MIC*    CIPROFLOXACIN <=0.5 SENSITIVE Sensitive     ERYTHROMYCIN >=8 RESISTANT Resistant     GENTAMICIN <=0.5 SENSITIVE Sensitive     OXACILLIN >=4 RESISTANT Resistant     TETRACYCLINE <=1 SENSITIVE Sensitive     VANCOMYCIN <=0.5 SENSITIVE Sensitive      TRIMETH/SULFA <=10 SENSITIVE Sensitive     CLINDAMYCIN >=8 RESISTANT Resistant     RIFAMPIN <=0.5 SENSITIVE Sensitive     Inducible Clindamycin NEGATIVE Sensitive     * ABUNDANT METHICILLIN RESISTANT STAPHYLOCOCCUS AUREUS  Culture, blood (routine x 2)     Status: None   Collection Time: 04/29/19 12:20 PM   Specimen: BLOOD LEFT HAND  Result Value Ref Range Status   Specimen Description BLOOD LEFT HAND  Final   Special Requests   Final    BOTTLES DRAWN AEROBIC ONLY Blood Culture adequate volume   Culture   Final    NO GROWTH 5 DAYS Performed at [facility], [address], [place], [place] [address]    Report Status 05/04/2019 FINAL  Final  Culture, blood (routine x 2)     Status: None   Collection Time: 04/29/19 12:25 PM   Specimen: BLOOD  Result Value Ref Range Status   Specimen Description BLOOD LEFT ANTECUBITAL  Final   Special Requests   Final    BOTTLES DRAWN AEROBIC ONLY Blood Culture adequate volume   Culture   Final    NO GROWTH 5 DAYS Performed at [facility], [address], [place], [place] [address]    Report Status 05/04/2019 FINAL  Final      Radiology Studies: No results found.  Carma Leaven DO [facility]  Contact via  [email or web address]  [facility] Office Info P: [phone] F: [phone]

## 2019-05-09 LAB — COMPREHENSIVE METABOLIC PANEL
ALT: 381 U/L — ABNORMAL HIGH (ref 0–44)
AST: 234 U/L — ABNORMAL HIGH (ref 15–41)
Albumin: 3.2 g/dL — ABNORMAL LOW (ref 3.5–5.0)
Alkaline Phosphatase: 173 U/L — ABNORMAL HIGH (ref 38–126)
Anion gap: 10 (ref 5–15)
BUN: 10 mg/dL (ref 6–20)
CO2: 24 mmol/L (ref 22–32)
Calcium: 8.9 mg/dL (ref 8.9–10.3)
Chloride: 100 mmol/L (ref 98–111)
Creatinine, Ser: 0.85 mg/dL (ref 0.61–1.24)
GFR calc Af Amer: >60 mL/min (ref >60)
GFR calc non Af Amer: >60 mL/min (ref >60)
Glucose, Bld: 111 mg/dL — ABNORMAL HIGH (ref 70–99)
Potassium: 4.2 mmol/L (ref 3.5–5.1)
Sodium: 134 mmol/L — ABNORMAL LOW (ref 135–145)
Total Bilirubin: 0.9 mg/dL (ref 0.3–1.2)
Total Protein: 6.9 g/dL (ref 6.5–8.1)

## 2019-05-09 LAB — HCV RNA QUANT
HCV Quantitative Log: 6.687 log10 IU/mL (ref >1.70)
HCV Quantitative: 4860000 IU/mL (ref >50)

## 2019-05-09 NOTE — Progress Notes (Signed)
PROGRESS NOTE  Karl Erway PPJ:[id number] DOB: 11-09-1985 DOA: 04/27/2019 PCP: Patient, [name] Per   LOS: 11 days   Brief Narrative / Interim history: 33 year old male with history of IV drug use, chronic pain, depression with recent suicide attempt was admitted to the hospital on 04/27/2019 due to a right forearm lesion/abscess after using IV drugs.  Orthopedic surgery was consulted and patient underwent incision/drainage in the OR.  His blood cultures showed MRSA as well as Candida glabrata.  ID following  Subjective: No acute issues or events overnight, no complaints this morning, no abdominal pain, no nausea or vomiting.  Arm pain is markedly improving, range of motion is also improving.  Assessment & Plan: Principal Problem:   Abscess of right forearm Active Problems:   MDD (major depressive disorder), severe (HCC)   Opioid dependence with opioid-induced mood disorder (HCC)   Hyperglycemia   Leucocytosis   LFTs abnormal   MRSA bacteremia   IVDU (intravenous drug user)   Fungemia   Blood culture positive for Candida species    Right forearm abscess, MRSA bacteremia and Candida glabrata positive blood cultures, POA -s/p incision and drainage in the OR 04/28/2019, appreciate orthopedic surgery follow-up -ID following, currently on anidulafungin as well as vancomycin -TTE/TEE - without evidence of vegetations -ID recommends IV anidulafungin with tentative DC date on 05/16/2019, and vancomycin for his MRSA bacteremia 05/19/2019 - plan for DC home that day.  Depression with anxiety/history of suicidal ideation -He just had a stay at behavioral health prior to this admission. Currently stable  Elevated LFTs likely in the setting of Hepatitis C infection -Hepatitis panel remarkable for HCV antibody positive(HCV quant RNA pending) -Initially thought to be reactive to injected illicit drugs vs antifungal medication; however given HCV Ab this is likely to be the culprit given  fluctuating labs with antifungals ongoing and cessation of illicit substances -Right upper quadrant ultrasound was unremarkable for liver lesions.  -HIV negative  Scheduled Meds: . docusate sodium  100 mg Oral BID  . gabapentin  300 mg Oral TID   Continuous Infusions: . anidulafungin 100 mg (05/08/19 1236)  . methocarbamol (ROBAXIN) IV    . vancomycin 1,250 mg (05/08/19 2201)   PRN Meds:.acetaminophen, diphenhydrAMINE, methocarbamol **OR** methocarbamol (ROBAXIN) IV, ondansetron **OR** ondansetron (ZOFRAN) IV, oxyCODONE, polyethylene glycol, sodium chloride flush  DVT prophylaxis: SCDs Code Status: Full code Family Communication: d/w patient  Disposition Plan: Likely discharge home on 05/19/2019 once patient is completed IV antibiotic course as above  Consultants:   ID  Orthopedic surgery  Cardiology for TEE  Procedures:   2D echo IMPRESSIONS  1. The left ventricle has hyperdynamic systolic function, with an ejection fraction of >65%. The cavity size was normal. Left ventricular diastolic parameters were normal.  2. The aortic valve is tricuspid. No stenosis of the aortic valve.  3. The aortic root is normal in size and structure.  4. The right ventricle has normal systolic function. The cavity was normal. There is no increase in right ventricular wall thickness.  5. No evidence of mitral valve stenosis. No mitral regurgitation noted.  6. Normal IVC size. No complete TR doppler jet so unable to estimate PA systolic pressure.  7. No valvular vegetation noted.   TEE 1. The left ventricle has normal systolic function, with an ejection fraction of 60-65%. The cavity size was normal. No evidence of left ventricular regional wall motion abnormalities.  2. The right ventricle has normal systolc function. The cavity was normal. There is no increase  in right ventricular wall thickness.  3. No evidence of a thrombus present in the left atrial appendage.  4. No mitral valve  vegetation visualized.  5. The aortic valve is tricuspid.  6. The aortic root is normal in size and structure.  7. No vegetations, no endocarditis.  Antimicrobials:  Vancomycin 8/28  Anidulafungin 9/2  Objective: Vitals:   05/08/19 0459 05/08/19 1522 05/08/19 2245 05/09/19 0527  BP: (!) 98/51 121/66 115/70 114/72  Pulse: 70 84 90 81  Resp: 17 16 18 18   Temp: 98.5 F (36.9 C) 98.5 F (36.9 C) 98.9 F (37.2 C) 99.5 F (37.5 C)  TempSrc: Oral Oral Oral Oral  SpO2: 98% 100% 100% 100%  Weight:      Height:        Intake/Output Summary (Last 24 hours) at 05/09/2019 0751 Last data filed at 05/09/2019 0400 Gross per 24 hour  Intake 610 ml  Output -  Net 610 ml   Filed Weights   04/27/19 2257 04/27/19 2318 05/03/19 1320  Weight: 75 kg 77.1 kg 77 kg    Examination:  General:  Pleasantly resting in bed, No acute distress. HEENT:  Normocephalic atraumatic.  Sclerae nonicteric, noninjected.  Extraocular movements intact bilaterally. Neck:  Without mass or deformity.  Trachea is midline. Lungs:  Clear to auscultate bilaterally without rhonchi, wheeze, or rales. Heart:  Regular rate and rhythm.  Without murmurs, rubs, or gallops. Abdomen:  Soft, nontender, nondistended.  Without guarding or rebound. Extremities: Right upper extremity erythema improving, bandage clean dry intact. Vascular:  Dorsalis pedis and posterior tibial pulses palpable bilaterally.  Data Reviewed: I have independently reviewed following labs and imaging studies   CBC: Recent Labs  Lab 05/03/19 1549 05/04/19 0832 05/05/19 0831 05/06/19 0340  WBC 8.2 8.3 8.4 9.7  HGB 12.6* 12.1* 12.4* 12.4*  HCT 39.4 37.6* 39.4 39.4  MCV 83.1 82.3 83.1 83.1  PLT 270 291 322 329   Basic Metabolic Panel: Recent Labs  Lab 05/03/19 1549 05/04/19 0832 05/05/19 0831 05/06/19 0340 05/08/19 0146  NA 137 139 138 140 138  K 4.5 4.2 4.4 4.1 4.1  CL 107 108 108 107 105  CO2 23 24 22 24 26   GLUCOSE 93 148* 117* 107*  109*  BUN 12 12 8 10 12   CREATININE 0.85 0.96 0.78 0.89 0.98  CALCIUM 8.5* 8.7* 8.9 8.8* 9.0   Liver Function Tests: Recent Labs  Lab 05/04/19 0832 05/05/19 0831 05/06/19 0340 05/07/19 1315 05/08/19 0146  AST 215* 231* 208* 236* 210*  ALT 269* 312* 320* 377* 341*  ALKPHOS 159* 155* 173* 180* 157*  BILITOT 0.7 0.6 0.5 0.9 0.7  PROT 6.1* 6.6 6.5 7.6 6.7  ALBUMIN 2.6* 2.8* 2.9* 3.3* 3.0*   Urine analysis:    Component Value Date/Time   COLORURINE YELLOW 02/09/2011 2210   APPEARANCEUR CLOUDY (A) 02/09/2011 2210   LABSPEC 1.025 02/09/2011 2210   PHURINE 7.0 02/09/2011 2210   GLUCOSEU NEGATIVE 02/09/2011 2210   HGBUR NEGATIVE 02/09/2011 2210   BILIRUBINUR NEGATIVE 02/09/2011 2210   KETONESUR NEGATIVE 02/09/2011 2210   PROTEINUR NEGATIVE 02/09/2011 2210   UROBILINOGEN 1.0 02/09/2011 2210   NITRITE NEGATIVE 02/09/2011 2210   LEUKOCYTESUR NEGATIVE 02/09/2011 2210   Sepsis Labs: Invalid input(s): PROCALCITONIN, LACTICIDVEN  Recent Results (from the past 240 hour(s))  Culture, blood (routine x 2)     Status: None   Collection Time: 04/29/19 12:20 PM   Specimen: BLOOD LEFT HAND  Result Value Ref Range Status  Specimen Description BLOOD LEFT HAND  Final   Special Requests   Final    BOTTLES DRAWN AEROBIC ONLY Blood Culture adequate volume   Culture   Final    NO GROWTH 5 DAYS Performed at [facility], [address] [place], [place] [address]    Report Status 05/04/2019 FINAL  Final  Culture, blood (routine x 2)     Status: None   Collection Time: 04/29/19 12:25 PM   Specimen: BLOOD  Result Value Ref Range Status   Specimen Description BLOOD LEFT ANTECUBITAL  Final   Special Requests   Final    BOTTLES DRAWN AEROBIC ONLY Blood Culture adequate volume   Culture   Final    NO GROWTH 5 DAYS Performed at [facility], [address] [place], [place] [address]    Report Status 05/04/2019 FINAL  Final      Radiology Studies: No results found.   Carma LeavenWilliam Marvie Brevik DO [facility]  Contact via  [email or web address]  [facility] Office Info P: [phone] F: [phone]

## 2019-05-10 ENCOUNTER — Inpatient Hospital Stay: Payer: Self-pay

## 2019-05-10 LAB — VANCOMYCIN, PEAK: Vancomycin Pk: 7 ug/mL — ABNORMAL LOW (ref 30–40)

## 2019-05-10 MED ORDER — LINEZOLID 600 MG PO TABS
600.0000 mg | ORAL_TABLET | Freq: Once | ORAL | Status: AC
  Administered 2019-05-11: 01:00:00 600 mg via ORAL
  Filled 2019-05-10: qty 1

## 2019-05-10 NOTE — Progress Notes (Signed)
This nurse arrived to patient's room to assess for placing a PIV. Patient is not a candidate for another midline d/t 9 days of ordered vancomycin. Upon assessment of L arm. Patient c/o swelling, pain, and noted redness the length of the arm. Upon palpation to cephalic vein noted some cording of the vein. Patient at this time is refusing a PIV restart. Requesting to see MD first before getting any further medication via IV. Patient reports that he has used heat, ice, and elevated R arm already with no change. He reports having fevers and headache also at this time. This nurse spoke to patient's nurse Garen Grams RN and recommend MD be contacted and request placement of a PICC line or other CVC d/t complications and extended use of Vancomycin. Hui RN VU. Fran Lowes, RN VAST

## 2019-05-10 NOTE — Progress Notes (Signed)
Dear Doctor: This patient has been identified as a candidate for PICC or other CVC device for the following reason (s): ordered 9 days of Vancomycin. If you agree, please write an order for the indicated device. For any questions contact the Vascular Access Team at [phone] if no answer, please leave a message.  Thank you for supporting the early vascular access assessment program.

## 2019-05-10 NOTE — Progress Notes (Signed)
PROGRESS NOTE  Janene MadeiraKevin Obyrne UJW:[id number]:[id number] DOB: 1986/07/13 DOA: 04/27/2019 PCP: Patient, [name] Per   LOS: 12 days   Brief Narrative / Interim history: 33 year old male with history of IV drug use, chronic pain, depression with recent suicide attempt was admitted to the hospital on 04/27/2019 due to a right forearm lesion/abscess after using IV drugs.  Orthopedic surgery was consulted and patient underwent incision/drainage in the OR.  His blood cultures showed MRSA as well as Candida glabrata.  ID following  Subjective: No acute issues or events overnight, no complaints this morning, no abdominal pain, no nausea or vomiting.  Arm pain is markedly improving, range of motion is also improving.  Assessment & Plan: Principal Problem:   Abscess of right forearm Active Problems:   MDD (major depressive disorder), severe (HCC)   Opioid dependence with opioid-induced mood disorder (HCC)   Hyperglycemia   Leucocytosis   LFTs abnormal   MRSA bacteremia   IVDU (intravenous drug user)   Fungemia   Blood culture positive for Candida species    Right forearm abscess, MRSA bacteremia and Candida glabrata positive blood cultures, POA -s/p incision and drainage in the OR 04/28/2019, appreciate orthopedic surgery follow-up -ID following, currently on anidulafungin as well as vancomycin -TTE/TEE - without evidence of vegetations -ID recommends IV anidulafungin with tentative DC date on 05/16/2019, and vancomycin for his MRSA bacteremia 05/19/2019 - plan for DC home that day.  Depression with anxiety/history of suicidal ideation -He just had a stay at behavioral health prior to this admission. Currently stable  Elevated LFTs likely in the setting of Hepatitis C infection -Hepatitis panel remarkable for HCV antibody positive(HCV quant RNA pending) -Initially thought to be reactive to injected illicit drugs vs antifungal medication; however given HCV Ab this is likely to be the culprit given  fluctuating labs with antifungals ongoing and cessation of illicit substances -Right upper quadrant ultrasound was unremarkable for liver lesions.  -HIV negative  Scheduled Meds: . docusate sodium  100 mg Oral BID  . gabapentin  300 mg Oral TID   Continuous Infusions: . anidulafungin 100 mg (05/09/19 1218)  . methocarbamol (ROBAXIN) IV    . vancomycin 1,250 mg (05/09/19 2107)   PRN Meds:.acetaminophen, diphenhydrAMINE, methocarbamol **OR** methocarbamol (ROBAXIN) IV, ondansetron **OR** ondansetron (ZOFRAN) IV, oxyCODONE, polyethylene glycol, sodium chloride flush  DVT prophylaxis: SCDs Code Status: Full code Family Communication: d/w patient  Disposition Plan: Likely discharge home on 05/19/2019 once patient is completed IV antibiotic course as above  Consultants:   ID  Orthopedic surgery  Cardiology for TEE  Procedures:   2D echo IMPRESSIONS  1. The left ventricle has hyperdynamic systolic function, with an ejection fraction of >65%. The cavity size was normal. Left ventricular diastolic parameters were normal.  2. The aortic valve is tricuspid. No stenosis of the aortic valve.  3. The aortic root is normal in size and structure.  4. The right ventricle has normal systolic function. The cavity was normal. There is no increase in right ventricular wall thickness.  5. No evidence of mitral valve stenosis. No mitral regurgitation noted.  6. Normal IVC size. No complete TR doppler jet so unable to estimate PA systolic pressure.  7. No valvular vegetation noted.   TEE 1. The left ventricle has normal systolic function, with an ejection fraction of 60-65%. The cavity size was normal. No evidence of left ventricular regional wall motion abnormalities.  2. The right ventricle has normal systolc function. The cavity was normal. There is no increase  in right ventricular wall thickness.  3. No evidence of a thrombus present in the left atrial appendage.  4. No mitral valve  vegetation visualized.  5. The aortic valve is tricuspid.  6. The aortic root is normal in size and structure.  7. No vegetations, no endocarditis.  Antimicrobials:  Vancomycin 8/28  Anidulafungin 9/2  Objective: Vitals:   05/09/19 0527 05/09/19 1508 05/09/19 2142 05/10/19 0507  BP: 114/72 115/77 115/61 105/62  Pulse: 81 92 92 83  Resp: 18 18 17 18   Temp: 99.5 F (37.5 C) 99.2 F (37.3 C) 98.9 F (37.2 C) 98.7 F (37.1 C)  TempSrc: Oral Oral Oral Oral  SpO2: 100% 99% 99% 99%  Weight:      Height:        Intake/Output Summary (Last 24 hours) at 05/10/2019 0804 Last data filed at 05/10/2019 0614 Gross per 24 hour  Intake 2284.37 ml  Output -  Net 2284.37 ml   Filed Weights   04/27/19 2257 04/27/19 2318 05/03/19 1320  Weight: 75 kg 77.1 kg 77 kg    Examination:  General:  Pleasantly resting in bed, No acute distress. HEENT:  Normocephalic atraumatic.  Sclerae nonicteric, noninjected.  Extraocular movements intact bilaterally. Neck:  Without mass or deformity.  Trachea is midline. Lungs:  Clear to auscultate bilaterally without rhonchi, wheeze, or rales. Heart:  Regular rate and rhythm.  Without murmurs, rubs, or gallops. Abdomen:  Soft, nontender, nondistended.  Without guarding or rebound. Extremities: Right upper extremity erythema improving, bandage clean dry intact. Vascular:  Dorsalis pedis and posterior tibial pulses palpable bilaterally.  Data Reviewed: I have independently reviewed following labs and imaging studies   CBC: Recent Labs  Lab 05/03/19 1549 05/04/19 0832 05/05/19 0831 05/06/19 0340  WBC 8.2 8.3 8.4 9.7  HGB 12.6* 12.1* 12.4* 12.4*  HCT 39.4 37.6* 39.4 39.4  MCV 83.1 82.3 83.1 83.1  PLT 270 291 322 749   Basic Metabolic Panel: Recent Labs  Lab 05/04/19 0832 05/05/19 0831 05/06/19 0340 05/08/19 0146 05/09/19 0701  NA 139 138 140 138 134*  K 4.2 4.4 4.1 4.1 4.2  CL 108 108 107 105 100  CO2 24 22 24 26 24   GLUCOSE 148* 117*  107* 109* 111*  BUN 12 8 10 12 10   CREATININE 0.96 0.78 0.89 0.98 0.85  CALCIUM 8.7* 8.9 8.8* 9.0 8.9   Liver Function Tests: Recent Labs  Lab 05/05/19 0831 05/06/19 0340 05/07/19 1315 05/08/19 0146 05/09/19 0701  AST 231* 208* 236* 210* 234*  ALT 312* 320* 377* 341* 381*  ALKPHOS 155* 173* 180* 157* 173*  BILITOT 0.6 0.5 0.9 0.7 0.9  PROT 6.6 6.5 7.6 6.7 6.9  ALBUMIN 2.8* 2.9* 3.3* 3.0* 3.2*   Urine analysis:    Component Value Date/Time   COLORURINE YELLOW 02/09/2011 2210   APPEARANCEUR CLOUDY (A) 02/09/2011 2210   LABSPEC 1.025 02/09/2011 2210   PHURINE 7.0 02/09/2011 2210   GLUCOSEU NEGATIVE 02/09/2011 2210   HGBUR NEGATIVE 02/09/2011 2210   BILIRUBINUR NEGATIVE 02/09/2011 2210   KETONESUR NEGATIVE 02/09/2011 2210   PROTEINUR NEGATIVE 02/09/2011 2210   UROBILINOGEN 1.0 02/09/2011 2210   NITRITE NEGATIVE 02/09/2011 2210   LEUKOCYTESUR NEGATIVE 02/09/2011 2210   Sepsis Labs: Invalid input(s): PROCALCITONIN, LACTICIDVEN  No results found for this or any previous visit (from the past 240 hour(s)).    Radiology Studies: No results found.  Holli Humbles DO [facility]  Contact via  [email or web address]  [place] P: [phone] F: [phone]

## 2019-05-11 LAB — COMPREHENSIVE METABOLIC PANEL
ALT: 428 U/L — ABNORMAL HIGH (ref 0–44)
AST: 245 U/L — ABNORMAL HIGH (ref 15–41)
Albumin: 3.3 g/dL — ABNORMAL LOW (ref 3.5–5.0)
Alkaline Phosphatase: 197 U/L — ABNORMAL HIGH (ref 38–126)
Anion gap: 9 (ref 5–15)
BUN: 14 mg/dL (ref 6–20)
CO2: 24 mmol/L (ref 22–32)
Calcium: 8.8 mg/dL — ABNORMAL LOW (ref 8.9–10.3)
Chloride: 102 mmol/L (ref 98–111)
Creatinine, Ser: 0.96 mg/dL (ref 0.61–1.24)
GFR calc Af Amer: >60 mL/min (ref >60)
GFR calc non Af Amer: >60 mL/min (ref >60)
Glucose, Bld: 111 mg/dL — ABNORMAL HIGH (ref 70–99)
Potassium: 4.1 mmol/L (ref 3.5–5.1)
Sodium: 135 mmol/L (ref 135–145)
Total Bilirubin: 0.9 mg/dL (ref 0.3–1.2)
Total Protein: 7.5 g/dL (ref 6.5–8.1)

## 2019-05-11 LAB — HEPATITIS C GENOTYPE: HCV Genotype: 3

## 2019-05-11 MED ORDER — LINEZOLID 600 MG PO TABS
600.0000 mg | ORAL_TABLET | Freq: Two times a day (BID) | ORAL | Status: AC
  Administered 2019-05-11 (×2): 600 mg via ORAL
  Filled 2019-05-11 (×2): qty 1

## 2019-05-11 NOTE — Progress Notes (Addendum)
Pharmacy Antibiotic Note Jon Castro is a 33 y.o. male admitted on 04/27/2019 with RUE abcess/cellulitis and MRSA/candida bacteremia. Pharmacy has been consulted for vancomycin dosing.  SCr is currently stable with CrCl>100 ml/min. afebrile,  WBC WNL- I&D 8/29, washout 8/30, TEE did not show evidence of endocarditis.   9/12: Patient lost IV access overnight and was given a one-time dose of zyvox, currently IR consult has been ordered for a central line placement. Spoke with Catalina Antigua of ID service who authorized an additional two doses of zyvox during the meantime of waiting for IR placement of central line.   Plan: Zyvox 600mg  tablet PO q12h for two doses while waiting for IR placement of central line Follow-up restart of vancomycin and Eraxis when central line is placed Monitor renal function  Monitor levels as needed   Height: 6\' 1"  (185.4 cm) Weight: 169 lb 12.1 oz (77 kg) IBW/kg (Calculated) : 79.9  Temp (24hrs), Avg:100.3 F (37.9 C), Min:98.2 F (36.8 C), Max:102.8 F (39.3 C)  Recent Labs  Lab 05/04/19 0832 05/05/19 0831 05/06/19 0340 05/07/19 1315 05/07/19 2146 05/08/19 0146 05/09/19 0701 05/10/19 1301  WBC 8.3 8.4 9.7  --   --   --   --   --   CREATININE 0.96 0.78 0.89  --   --  0.98 0.85  --   VANCOTROUGH  --   --   --   --  13*  --   --   --   VANCOPEAK  --   --   --  42*  --   --   --  7*    Estimated Creatinine Clearance: 133.4 mL/min (by C-G formula based on SCr of 0.85 mg/dL).    Allergies  Allergen Reactions  . Vicodin [Hydrocodone-Acetaminophen] Itching  . Codeine Nausea And Vomiting  . Tramadol Other (See Comments)    "messes with head"   Antimicrobials this admission:  9/12> Zyvox >> (3 doses while IV access missing) 8/29 Zosyn x 1  8/30 Vanc >>  9/2: Eraxis>>  Microbiology results:  8/29 BCx: MRSA bacteremia, candida glabrata 8/31: BC x 2: ngtd 8/30 COVID: neg 8/30: MRSA PCR + 8/30 RUE abscess: MRSA 9/6 hep c ab: ordered 9/6 hep b  ab: ordered 9/6 HIV: ordered    Thank you for the interesting consult and for involving pharmacy in this patient's care.  Tamela Gammon, PharmD 05/11/2019 11:53 AM PGY-2 Pharmacy Administration Resident Direct Phone: [phone] Please check [email or web address] for unit-specific pharmacist phone numbers

## 2019-05-11 NOTE — Progress Notes (Signed)
At bedside to place PICC line.  Assessed pt arms.  RA with known abcess bandaged _ reason for admission.  LA with swelling, redness, warm to touch and pain in forearm and LUA from different IV sites.  LUA with very hard veins noted, noncompressible and painful to pt. States had Vancomycin infiltrate.  PICC line placement or PIV placement not suitable, recommend central access due to BUE assessments as charted.  Pt aware.  RN aware.

## 2019-05-11 NOTE — Progress Notes (Signed)
Patient for central line placement . IR ordered but have not seen anyone from IR. Called IR but no one is around, left message. Md aware and concerned about IV meds, was advised if IV team can put a line for the IV med while waiting for IR. IV team consulted, awaiting for response.

## 2019-05-11 NOTE — Progress Notes (Signed)
PROGRESS NOTE  Jon MadeiraKevin Castro ZOX:[id number] DOB: September 16, 1985 DOA: 04/27/2019 PCP: Patient, [name] Per   LOS: 13 days   Brief Narrative / Interim history: 33 year old male with history of IV drug use, chronic pain, depression with recent suicide attempt was admitted to the hospital on 04/27/2019 due to a right forearm lesion/abscess after using IV drugs.  Orthopedic surgery was consulted and patient underwent incision/drainage in the OR.  His blood cultures showed MRSA as well as Candida glabrata.  ID following  Subjective: No acute issues or events overnight, no complaints this morning, no abdominal pain, no nausea or vomiting.  Arm pain is markedly improving, range of motion is also improving.  Assessment & Plan: Principal Problem:   Blood culture positive for Candida species Active Problems:   Abscess of right forearm   MRSA bacteremia   Fungemia   MDD (major depressive disorder), severe (HCC)   Opioid dependence with opioid-induced mood disorder (HCC)   Hyperglycemia   Leucocytosis   LFTs abnormal   IVDU (intravenous drug user)    Right forearm abscess, MRSA bacteremia and Candida glabrata positive blood cultures, POA -Either overnight questionably reactive given increasing pain and infiltrate of IV antibiotics/fluid -s/p incision and drainage in the OR 04/28/2019, appreciate orthopedic surgery follow-up -ID following, currently on anidulafungin as well as vancomycin -TTE/TEE - without evidence of vegetations -ID recommends IV anidulafungin with tentative DC date on 05/16/2019, and vancomycin for his MRSA bacteremia 05/19/2019 - plan for DC home that day. -IV ACCESS ISSUES 05/10/19-05/11/19 -overnight patient lost access to IV, was able to take p.o. linezolid but after IV team evaluation, given right upper extremity infection and left upper extremity erythema and infiltration IR has been consulted for definitive IJ placement to ensure safe ministration of remainder of patient's IV  antifungal and antibiotics -unclear at this point if missing antifungal will delay patient's discharge, will discuss with ID.  Patient did receive antifungal on 05/10/2019, has yet to receive dose today on the 12th.  Left upper arm blanching erythema, likely reactive -Concern for IV infiltration overnight, patient has a palpable cord left upper extremity concerning for phlebitis -treat symptomatically, NSAIDs, heat, ice -Erythema again likely reactive -less likely to be infectious process  Depression with anxiety/history of suicidal ideation -He just had a stay at behavioral health prior to this admission. Currently stable  Elevated LFTs likely in the setting of Hepatitis C infection, ongoing -Hepatitis panel remarkable for HCV antibody positive (HCV quant RNA pending) -Initially thought to be reactive to injected illicit drugs vs antifungal medication; however given HCV Ab this is likely to be the culprit given improving labs with antifungals ongoing and cessation of illicit substances -Right upper quadrant ultrasound was unremarkable for liver lesions.  -HIV negative -Liver panel continues to worsen over the past few days, patient will complete antifungal therapy on the 16th; will follow closely after completion of antifungals  Scheduled Meds: . docusate sodium  100 mg Oral BID  . gabapentin  300 mg Oral TID  . linezolid  600 mg Oral Q12H   Continuous Infusions: . anidulafungin 100 mg (05/10/19 1010)  . methocarbamol (ROBAXIN) IV    . vancomycin 1,250 mg (05/10/19 1310)   PRN Meds:.acetaminophen, diphenhydrAMINE, methocarbamol **OR** methocarbamol (ROBAXIN) IV, ondansetron **OR** ondansetron (ZOFRAN) IV, oxyCODONE, polyethylene glycol, sodium chloride flush  DVT prophylaxis: SCDs Code Status: Full code Family Communication: d/w patient  Disposition Plan: Likely discharge home on 05/19/2019 once patient has completed IV antibiotic/antifungal course as above  Consultants:  ID   Orthopedic surgery  Cardiology for TEE  Procedures:   2D echo IMPRESSIONS  1. The left ventricle has hyperdynamic systolic function, with an ejection fraction of >65%. The cavity size was normal. Left ventricular diastolic parameters were normal.  2. The aortic valve is tricuspid. No stenosis of the aortic valve.  3. The aortic root is normal in size and structure.  4. The right ventricle has normal systolic function. The cavity was normal. There is no increase in right ventricular wall thickness.  5. No evidence of mitral valve stenosis. No mitral regurgitation noted.  6. Normal IVC size. No complete TR doppler jet so unable to estimate PA systolic pressure.  7. No valvular vegetation noted.   TEE 1. The left ventricle has normal systolic function, with an ejection fraction of 60-65%. The cavity size was normal. No evidence of left ventricular regional wall motion abnormalities.  2. The right ventricle has normal systolc function. The cavity was normal. There is no increase in right ventricular wall thickness.  3. No evidence of a thrombus present in the left atrial appendage.  4. No mitral valve vegetation visualized.  5. The aortic valve is tricuspid.  6. The aortic root is normal in size and structure.  7. No vegetations, no endocarditis.  Antimicrobials:  Vancomycin 8/28  Anidulafungin 9/2  Objective: Vitals:   05/11/19 0607 05/11/19 0608 05/11/19 1034 05/11/19 1217  BP:  110/62 109/77 94/64  Pulse:  85 89 79  Resp: 16  18 18   Temp:  98.2 F (36.8 C) 98.6 F (37 C) 98.4 F (36.9 C)  TempSrc:  Oral Oral Oral  SpO2:  100% 99% 100%  Weight:      Height:        Intake/Output Summary (Last 24 hours) at 05/11/2019 1349 Last data filed at 05/11/2019 0947 Gross per 24 hour  Intake 910 ml  Output -  Net 910 ml   Filed Weights   04/27/19 2257 04/27/19 2318 05/03/19 1320  Weight: 75 kg 77.1 kg 77 kg    Examination:  General:  Pleasantly resting in bed, No acute  distress. HEENT:  Normocephalic atraumatic.  Sclerae nonicteric, noninjected.  Extraocular movements intact bilaterally. Neck:  Without mass or deformity.  Trachea is midline. Lungs:  Clear to auscultate bilaterally without rhonchi, wheeze, or rales. Heart:  Regular rate and rhythm.  Without murmurs, rubs, or gallops. Abdomen:  Soft, nontender, nondistended.  Without guarding or rebound. Extremities: Right upper extremity erythema improving, bandage clean dry intact.  Left upper extremity proximally and laterally noted to be warm, blanching erythema and palpable cord concerning for phlebitis. Vascular:  Dorsalis pedis and posterior tibial pulses palpable bilaterally.  Data Reviewed: I have independently reviewed following labs and imaging studies   CBC: Recent Labs  Lab 05/05/19 0831 05/06/19 0340  WBC 8.4 9.7  HGB 12.4* 12.4*  HCT 39.4 39.4  MCV 83.1 83.1  PLT 322 161   Basic Metabolic Panel: Recent Labs  Lab 05/05/19 0831 05/06/19 0340 05/08/19 0146 05/09/19 0701 05/11/19 0756  NA 138 140 138 134* 135  K 4.4 4.1 4.1 4.2 4.1  CL 108 107 105 100 102  CO2 22 24 26 24 24   GLUCOSE 117* 107* 109* 111* 111*  BUN 8 10 12 10 14   CREATININE 0.78 0.89 0.98 0.85 0.96  CALCIUM 8.9 8.8* 9.0 8.9 8.8*   Liver Function Tests: Recent Labs  Lab 05/06/19 0340 05/07/19 1315 05/08/19 0146 05/09/19 0701 05/11/19 0756  AST 208*  236* 210* 234* 245*  ALT 320* 377* 341* 381* 428*  ALKPHOS 173* 180* 157* 173* 197*  BILITOT 0.5 0.9 0.7 0.9 0.9  PROT 6.5 7.6 6.7 6.9 7.5  ALBUMIN 2.9* 3.3* 3.0* 3.2* 3.3*   Urine analysis:    Component Value Date/Time   COLORURINE YELLOW 02/09/2011 2210   APPEARANCEUR CLOUDY (A) 02/09/2011 2210   LABSPEC 1.025 02/09/2011 2210   PHURINE 7.0 02/09/2011 2210   GLUCOSEU NEGATIVE 02/09/2011 2210   HGBUR NEGATIVE 02/09/2011 2210   BILIRUBINUR NEGATIVE 02/09/2011 2210   KETONESUR NEGATIVE 02/09/2011 2210   PROTEINUR NEGATIVE 02/09/2011 2210    UROBILINOGEN 1.0 02/09/2011 2210   NITRITE NEGATIVE 02/09/2011 2210   LEUKOCYTESUR NEGATIVE 02/09/2011 2210   Radiology Studies: [place] Ekg Site Rite  Result Date: 05/10/2019 If Site Rite image not attached, placement could not be confirmed due to current cardiac rhythm.  Carma Leaven DO [facility]  Contact via  [email or web address]  [facility] Office Info P: [phone] F: [phone]

## 2019-05-11 NOTE — Progress Notes (Signed)
Patient lost IV access due to infiltration. Site is red and swollen.  IV team paged recommended picc line. See IV teamnote.  NP Bl[place]  and pharmacist notified that 2200 dose will not be given, PO zyvox 600 mg to be given, MEWS score 4 will follow protocols, will continue to monitor.

## 2019-05-11 NOTE — Plan of Care (Signed)
  Problem: Education: Goal: Knowledge of General Education information will improve Description: Including pain rating scale, medication(s)/side effects and non-pharmacologic comfort measures Outcome: Progressing   Problem: Health Behavior/Discharge Planning: Goal: Ability to manage health-related needs will improve Outcome: Progressing   Problem: Clinical Measurements: Goal: Will remain free from infection Outcome: Not Progressing   Problem: Clinical Measurements: Goal: Respiratory complications will improve Outcome: Progressing

## 2019-05-12 MED ORDER — LINEZOLID 600 MG PO TABS
600.0000 mg | ORAL_TABLET | Freq: Two times a day (BID) | ORAL | Status: AC
  Administered 2019-05-12 (×2): 600 mg via ORAL
  Filled 2019-05-12 (×2): qty 1

## 2019-05-12 MED ORDER — VANCOMYCIN VARIABLE DOSE PER UNSTABLE RENAL FUNCTION (PHARMACIST DOSING): Status: DC

## 2019-05-12 NOTE — Plan of Care (Signed)

## 2019-05-12 NOTE — Progress Notes (Addendum)
Pharmacy Antibiotic Note Jon Castro is a 33 y.o. male admitted on 04/27/2019 with RUE abcess/cellulitis and MRSA/candida bacteremia. Pharmacy has been consulted for vancomycin dosing.  SCr is currently stable with CrCl>100 ml/min. afebrile,  WBC WNL- I&D 8/29, washout 8/30, TEE did not show evidence of endocarditis.   Patient loss IV access on 9/12 and was shifted to Zyvox during IV access downtime. IR consult placed and currently waiting on central line placement. Catalina Antigua of ID authorized Zyvox during the meantime of waiting on a central line. Patient is adamant about not getting vancomycin in a peripheral IV line due to history of infiltration / infusion pain. Patient does have peripheral IV access now in his proximal hand, where he is getting his doses of Eraxis.  Today we will continue with Zyvox while waiting on Central line placement.  Patient will need to be reloaded with vancomycin due to it being greater then 48 hours since last dose.   Plan: Continue Zyvox 600mg  tablet PO q12h for two more doses while waiting for IR placement of central line CBC tomorrow morning (zyvox monitoring) Continue Eraxis 100mg  IV every 24 hours Follow-up reload of vancomycin and maintenance dose start once central line is placed  Monitor renal function  Monitor levels as needed   Height: 6\' 1"  (185.4 cm) Weight: 169 lb 12.1 oz (77 kg) IBW/kg (Calculated) : 79.9  Temp (24hrs), Avg:98.5 F (36.9 C), Min:98 F (36.7 C), Max:98.8 F (37.1 C)  Recent Labs  Lab 05/06/19 0340 05/07/19 1315 05/07/19 2146 05/08/19 0146 05/09/19 0701 05/10/19 1301 05/11/19 0756  WBC 9.7  --   --   --   --   --   --   CREATININE 0.89  --   --  0.98 0.85  --  0.96  VANCOTROUGH  --   --  13*  --   --   --   --   VANCOPEAK  --  42*  --   --   --  7*  --     Estimated Creatinine Clearance: 118.1 mL/min (by C-G formula based on SCr of 0.96 mg/dL).    Allergies  Allergen Reactions  . Vicodin  [Hydrocodone-Acetaminophen] Itching  . Codeine Nausea And Vomiting  . Tramadol Other (See Comments)    "messes with head"   Antimicrobials this admission:  9/12> Zyvox >> (5 doses while IV access missing) 9/13 8/29 Zosyn x 1  8/30 Vanc >> 9/11, 9/2: Eraxis>>  Microbiology results:  8/29 BCx: MRSA bacteremia, candida glabrata 8/31: BC x 2: ngtd 8/30 COVID: neg 8/30: MRSA PCR + 8/30 RUE abscess: MRSA 9/6 hep c ab: ordered 9/6 hep b ab: ordered 9/6 HIV: ordered    Thank you for the interesting consult and for involving pharmacy in this patient's care.  Tamela Gammon, PharmD 05/12/2019 8:50 AM PGY-2 Pharmacy Administration Resident Direct Phone: [phone] Please check [email or web address] for unit-specific pharmacist phone numbers

## 2019-05-12 NOTE — Progress Notes (Signed)
PROGRESS NOTE  Janene MadeiraKevin Bourdon ZOX:[id number] DOB: May 22, 1986 DOA: 04/27/2019 PCP: Patient, [name] Per   LOS: 14 days   Brief Narrative / Interim history: 33 year old male with history of IV drug use, chronic pain, depression with recent suicide attempt was admitted to the hospital on 04/27/2019 due to a right forearm lesion/abscess after using IV drugs.  Orthopedic surgery was consulted and patient underwent incision/drainage in the OR.  His blood cultures showed MRSA as well as Candida glabrata.  ID following  Subjective: No acute issues or events overnight, no complaints this morning, no abdominal pain, no nausea or vomiting.  Arm pain is markedly improving, range of motion is also improving.  Assessment & Plan: Principal Problem:   Blood culture positive for Candida species Active Problems:   Abscess of right forearm   MRSA bacteremia   Fungemia   MDD (major depressive disorder), severe (HCC)   Opioid dependence with opioid-induced mood disorder (HCC)   Hyperglycemia   Leucocytosis   LFTs abnormal   IVDU (intravenous drug user)    Right forearm abscess, MRSA bacteremia and Candida glabrata positive blood cultures, POA -Fever overnight on 05/10/2019 x1 -likely in the setting of vancomycin infiltration and pain -s/p incision and drainage in the OR 04/28/2019, appreciate orthopedic surgery follow-up -ID following, currently on anidulafungin as well as vancomycin -TTE/TEE - without evidence of vegetations -ID recommends IV anidulafungin with tentative DC date on 05/16/2019, and vancomycin for his MRSA bacteremia 05/19/2019 - plan for DC home that day. -IV ACCESS ISSUES 05/10/19-05/11/19 -patient was able to obtain IV access through peripheral IV late on 05/11/2019.  Patient was receiving p.o. linezolid per ID, will continue at this time even previous IV vancomycin infiltration with very poor vascular access.  Attempting to avoid central line placement as patient will be discharged in less  than 1 week at this point.  Patient was able to receive antifungal treatment yesterday, likely no delay or extension of antibiotic or antifungal duration given minimal disruption in administration timing  Left upper arm blanching erythema, infiltrate from IV -Concern for IV infiltration overnight, patient has a palpable cord left upper extremity concerning for phlebitis -treat symptomatically, NSAIDs, heat, ice -Erythema again likely reactive -less likely to be infectious process  Depression with anxiety/history of suicidal ideation -He just had a stay at behavioral health prior to this admission. Currently stable  Elevated LFTs likely in the setting of Hepatitis C infection, ongoing -Hepatitis panel remarkable for HCV antibody positive  -Hepatitis genotype 3, HCV quantitative 4,860,000; quantitative log 6.87 -Initially thought to be reactive to injected illicit drugs vs antifungal medication; however given HCV Ab this is likely to be the culprit given improving labs with antifungals ongoing and cessation of illicit substances -Right upper quadrant ultrasound was unremarkable for liver lesions.  -HIV negative -Liver panel continues to worsen over the past few days, patient will complete antifungal therapy on the 16th; will follow closely after completion of antifungals  Scheduled Meds: . docusate sodium  100 mg Oral BID  . gabapentin  300 mg Oral TID   Continuous Infusions: . anidulafungin 100 mg (05/11/19 1817)  . methocarbamol (ROBAXIN) IV    . vancomycin Stopped (05/11/19 2200)   PRN Meds:.acetaminophen, diphenhydrAMINE, methocarbamol **OR** methocarbamol (ROBAXIN) IV, ondansetron **OR** ondansetron (ZOFRAN) IV, oxyCODONE, polyethylene glycol, sodium chloride flush  DVT prophylaxis: SCDs Code Status: Full code Family Communication: d/w patient  Disposition Plan: Likely discharge home on 05/19/2019 once patient has completed IV antibiotic/antifungal course as above  Consultants:  ID  Orthopedic surgery  Cardiology for TEE  Procedures:   2D echo IMPRESSIONS  1. The left ventricle has hyperdynamic systolic function, with an ejection fraction of >65%. The cavity size was normal. Left ventricular diastolic parameters were normal.  2. The aortic valve is tricuspid. No stenosis of the aortic valve.  3. The aortic root is normal in size and structure.  4. The right ventricle has normal systolic function. The cavity was normal. There is no increase in right ventricular wall thickness.  5. No evidence of mitral valve stenosis. No mitral regurgitation noted.  6. Normal IVC size. No complete TR doppler jet so unable to estimate PA systolic pressure.  7. No valvular vegetation noted.   TEE 1. The left ventricle has normal systolic function, with an ejection fraction of 60-65%. The cavity size was normal. No evidence of left ventricular regional wall motion abnormalities.  2. The right ventricle has normal systolc function. The cavity was normal. There is no increase in right ventricular wall thickness.  3. No evidence of a thrombus present in the left atrial appendage.  4. No mitral valve vegetation visualized.  5. The aortic valve is tricuspid.  6. The aortic root is normal in size and structure.  7. No vegetations, no endocarditis.  Antimicrobials:  Vancomycin 8/28  Anidulafungin 9/2  Objective: Vitals:   05/11/19 1034 05/11/19 1217 05/11/19 2043 05/12/19 0542  BP: 109/77 94/64 113/63 105/71  Pulse: 89 79 86 71  Resp: 18 18 17 17   Temp: 98.6 F (37 C) 98.4 F (36.9 C) 98.8 F (37.1 C) 98 F (36.7 C)  TempSrc: Oral Oral Oral Oral  SpO2: 99% 100% 100% 100%  Weight:      Height:        Intake/Output Summary (Last 24 hours) at 05/12/2019 0749 Last data filed at 05/11/2019 0947 Gross per 24 hour  Intake 240 ml  Output -  Net 240 ml   Filed Weights   04/27/19 2257 04/27/19 2318 05/03/19 1320  Weight: 75 kg 77.1 kg 77 kg    Examination:  General:   Pleasantly resting in bed, No acute distress. HEENT:  Normocephalic atraumatic.  Sclerae nonicteric, noninjected.  Extraocular movements intact bilaterally. Neck:  Without mass or deformity.  Trachea is midline. Lungs:  Clear to auscultate bilaterally without rhonchi, wheeze, or rales. Heart:  Regular rate and rhythm.  Without murmurs, rubs, or gallops. Abdomen:  Soft, nontender, nondistended.  Without guarding or rebound. Extremities: Right upper extremity erythema improving, bandage clean dry intact.  Left upper extremity proximally and laterally noted to be warm, blanching erythema and palpable cord concerning for phlebitis. Vascular:  Dorsalis pedis and posterior tibial pulses palpable bilaterally.  Data Reviewed: I have independently reviewed following labs and imaging studies   CBC: Recent Labs  Lab 05/05/19 0831 05/06/19 0340  WBC 8.4 9.7  HGB 12.4* 12.4*  HCT 39.4 39.4  MCV 83.1 83.1  PLT 322 329   Basic Metabolic Panel: Recent Labs  Lab 05/05/19 0831 05/06/19 0340 05/08/19 0146 05/09/19 0701 05/11/19 0756  NA 138 140 138 134* 135  K 4.4 4.1 4.1 4.2 4.1  CL 108 107 105 100 102  CO2 22 24 26 24 24   GLUCOSE 117* 107* 109* 111* 111*  BUN 8 10 12 10 14   CREATININE 0.78 0.89 0.98 0.85 0.96  CALCIUM 8.9 8.8* 9.0 8.9 8.8*   Liver Function Tests: Recent Labs  Lab 05/06/19 0340 05/07/19 1315 05/08/19 0146 05/09/19 0701 05/11/19 0756  AST 208* 236* 210* 234* 245*  ALT 320* 377* 341* 381* 428*  ALKPHOS 173* 180* 157* 173* 197*  BILITOT 0.5 0.9 0.7 0.9 0.9  PROT 6.5 7.6 6.7 6.9 7.5  ALBUMIN 2.9* 3.3* 3.0* 3.2* 3.3*   Urine analysis:    Component Value Date/Time   COLORURINE YELLOW 02/09/2011 2210   APPEARANCEUR CLOUDY (A) 02/09/2011 2210   LABSPEC 1.025 02/09/2011 2210   PHURINE 7.0 02/09/2011 2210   GLUCOSEU NEGATIVE 02/09/2011 2210   HGBUR NEGATIVE 02/09/2011 2210   BILIRUBINUR NEGATIVE 02/09/2011 2210   KETONESUR NEGATIVE 02/09/2011 2210   PROTEINUR  NEGATIVE 02/09/2011 2210   UROBILINOGEN 1.0 02/09/2011 2210   NITRITE NEGATIVE 02/09/2011 2210   LEUKOCYTESUR NEGATIVE 02/09/2011 2210   Radiology Studies: [place] Ekg Site Rite  Result Date: 05/10/2019 If Site Rite image not attached, placement could not be confirmed due to current cardiac rhythm.  Holli Humbles DO [facility]  Contact via  [email or web address]  [place] P: [phone] F: [phone]

## 2019-05-13 ENCOUNTER — Inpatient Hospital Stay (HOSPITAL_COMMUNITY): Payer: Self-pay

## 2019-05-13 DIAGNOSIS — L818 Other specified disorders of pigmentation: Secondary | ICD-10-CM

## 2019-05-13 DIAGNOSIS — B379 Candidiasis, unspecified: Secondary | ICD-10-CM

## 2019-05-13 LAB — COMPREHENSIVE METABOLIC PANEL
ALT: 539 U/L — ABNORMAL HIGH (ref 0–44)
AST: 374 U/L — ABNORMAL HIGH (ref 15–41)
Albumin: 2.9 g/dL — ABNORMAL LOW (ref 3.5–5.0)
Alkaline Phosphatase: 188 U/L — ABNORMAL HIGH (ref 38–126)
Anion gap: 9 (ref 5–15)
BUN: 9 mg/dL (ref 6–20)
CO2: 24 mmol/L (ref 22–32)
Calcium: 8.6 mg/dL — ABNORMAL LOW (ref 8.9–10.3)
Chloride: 104 mmol/L (ref 98–111)
Creatinine, Ser: 0.96 mg/dL (ref 0.61–1.24)
GFR calc Af Amer: >60 mL/min (ref >60)
GFR calc non Af Amer: >60 mL/min (ref >60)
Glucose, Bld: 208 mg/dL — ABNORMAL HIGH (ref 70–99)
Potassium: 4 mmol/L (ref 3.5–5.1)
Sodium: 137 mmol/L (ref 135–145)
Total Bilirubin: 0.8 mg/dL (ref 0.3–1.2)
Total Protein: 6.4 g/dL — ABNORMAL LOW (ref 6.5–8.1)

## 2019-05-13 LAB — CBC
HCT: 34.3 % — ABNORMAL LOW (ref 39.0–52.0)
Hemoglobin: 10.7 g/dL — ABNORMAL LOW (ref 13.0–17.0)
MCH: 26.2 pg (ref 26.0–34.0)
MCHC: 31.2 g/dL (ref 30.0–36.0)
MCV: 83.9 fL (ref 80.0–100.0)
Platelets: 254 10*3/uL (ref 150–400)
RBC: 4.09 MIL/uL — ABNORMAL LOW (ref 4.22–5.81)
RDW: 15 % (ref 11.5–15.5)
WBC: 7.7 10*3/uL (ref 4.0–10.5)
nRBC: 0 % (ref 0.0–0.2)

## 2019-05-13 MED ORDER — VANCOMYCIN HCL 10 G IV SOLR
1250.0000 mg | Freq: Two times a day (BID) | INTRAVENOUS | Status: DC
  Administered 2019-05-14 – 2019-05-16 (×5): 1250 mg via INTRAVENOUS
  Filled 2019-05-13 (×7): qty 1250

## 2019-05-13 MED ORDER — LINEZOLID 600 MG PO TABS
600.0000 mg | ORAL_TABLET | Freq: Once | ORAL | Status: AC
  Administered 2019-05-13: 600 mg via ORAL
  Filled 2019-05-13: qty 1

## 2019-05-13 MED ORDER — VANCOMYCIN HCL 10 G IV SOLR
1500.0000 mg | Freq: Once | INTRAVENOUS | Status: AC
  Administered 2019-05-13: 1500 mg via INTRAVENOUS
  Filled 2019-05-13: qty 1500

## 2019-05-13 NOTE — Progress Notes (Signed)
Pharmacy Antibiotic Note Jon Castro is a 33 y.o. male admitted on 04/27/2019 with RUE abcess/cellulitis and MRSA/candida bacteremia. Pharmacy has been consulted for vancomycin dosing.  SCr is currently stable with CrCl>100 ml/min. afebrile,  WBC WNL- I&D 8/29, washout 8/30, TEE did not show evidence of endocarditis.  Patient lost IV access over the weekend and currently has a peripheral IV. We spoke with patient and he is okay with continuing vancomycin through the peripheral line instead of switching to linezolid.   Plan: Re-start Vancomycin 1500 mg X 1 then 1250 mg every 12 hours (Prediceted AUC 425) - Patient previously therapeutic on this dose  Continue Eraxis 100mg  IV every 24 hours Monitor renal function  Monitor levels as needed  Stop date for Vanc is 9/20 and stop date for Eraxis is 9/16  Height: 6\' 1"  (185.4 cm) Weight: 169 lb 12.1 oz (77 kg) IBW/kg (Calculated) : 79.9  Temp (24hrs), Avg:98.5 F (36.9 C), Min:98.3 F (36.8 C), Max:98.6 F (37 C)  Recent Labs  Lab 05/07/19 1315 05/07/19 2146 05/08/19 0146 05/09/19 0701 05/10/19 1301 05/11/19 0756 05/13/19 0807  WBC  --   --   --   --   --   --  7.7  CREATININE  --   --  0.98 0.85  --  0.96 0.96  VANCOTROUGH  --  13*  --   --   --   --   --   VANCOPEAK 42*  --   --   --  7*  --   --     Estimated Creatinine Clearance: 118.1 mL/min (by C-G formula based on SCr of 0.96 mg/dL).    Allergies  Allergen Reactions  . Vicodin [Hydrocodone-Acetaminophen] Itching  . Codeine Nausea And Vomiting  . Tramadol Other (See Comments)    "messes with head"   Antimicrobials this admission:  9/12> Zyvox >> (5 doses while IV access missing) 9/13 8/29 Zosyn x 1  8/30 Vanc >> 9/11, 9/2: Eraxis>>  Microbiology results:  8/29 BCx: MRSA bacteremia, candida glabrata 8/31: BC x 2: ngtd 8/30 COVID: neg 8/30: MRSA PCR + 8/30 RUE abscess: MRSA 9/6 hep c ab: ordered 9/6 hep b ab: ordered 9/6 HIV: ordered    Thank you for the  interesting consult and for involving pharmacy in this patient's care.  Jimmy Footman, PharmD, BCPS, BCIDP Infectious Diseases Clinical Pharmacist Phone: [phone] Please check [email or web address] for unit-specific pharmacist phone numbers

## 2019-05-13 NOTE — Progress Notes (Addendum)
PROGRESS NOTE  Janene MadeiraKevin Busche ZOX:[id number] DOB: [birth year] DOA: 04/27/2019 PCP: Patient, [name] Per   LOS: 15 days   Brief Narrative / Interim history: 33 year old male with history of IV drug use, chronic pain, depression with recent suicide attempt was admitted to the hospital on 04/27/2019 due to a right forearm lesion/abscess after using IV drugs.  Orthopedic surgery was consulted and patient underwent incision/drainage in the OR.  His blood cultures showed MRSA as well as Candida glabrata.  ID following  Subjective: No acute issues or events overnight, no complaints this morning, no abdominal pain, no nausea or vomiting.  Arm pain is markedly improving, range of motion is also improving.  Assessment & Plan: Principal Problem:   Blood culture positive for Candida species Active Problems:   Abscess of right forearm   MRSA bacteremia   Fungemia   MDD (major depressive disorder), severe (HCC)   Opioid dependence with opioid-induced mood disorder (HCC)   Hyperglycemia   Leucocytosis   LFTs abnormal   IVDU (intravenous drug user)    Right forearm abscess, MRSA bacteremia and Candida glabrata positive blood cultures, POA -Fever overnight on 05/10/2019 x1 -likely in the setting of vancomycin infiltration and pain -s/p incision and drainage in the OR 04/28/2019, appreciate orthopedic surgery follow-up -ID following, currently on anidulafungin as well as vancomycin -TTE/TEE - without evidence of vegetations -ID recommends IV anidulafungin with tentative DC date on 05/16/2019, and vancomycin/linezolid for his MRSA bacteremia 05/19/2019 - plan for DC home that day. -IV ACCESS ISSUES 05/10/19-05/11/19 -patient was able to obtain IV access through peripheral IV late on 05/11/2019.  Patient was receiving p.o. linezolid per ID, will continue at this time even previous IV vancomycin infiltration with very poor vascular access.  Attempting to avoid central line placement as patient will be discharged  in less than 1 week at this point.  Patient was able to receive antifungal treatment yesterday, likely no delay or extension of antibiotic or antifungal duration given minimal disruption in administration timing  Left upper arm blanching erythema, infiltrate from IV -Likely in the setting of IV infiltration previously, patient has a palpable vessel in the left upper extremity concerning for phlebitis -treat symptomatically, NSAIDs, heat, ice -Erythema again likely reactive -less likely to be infectious process -resolving  Depression with anxiety/history of suicidal ideation -He just had a stay at behavioral health prior to this admission. Currently stable  Elevated LFTs likely in the setting of Hepatitis C infection, ongoing -Hepatitis panel remarkable for HCV antibody positive  -Hepatitis genotype 3, HCV quantitative 4,860,000; quantitative log 6.87 -Initially thought to be reactive to injected illicit drugs vs antifungal medication; however given HCV Ab this is likely to be the culprit given improving labs with antifungals ongoing and cessation of illicit substances -Right upper quadrant ultrasound was unremarkable for liver lesions.  -HIV negative -Liver panel continues to worsen over the past few days, patient will complete antifungal therapy on the 16th; will follow closely after completion of antifungals  Scheduled Meds: . docusate sodium  100 mg Oral BID  . gabapentin  300 mg Oral TID  . vancomycin variable dose per unstable renal function (pharmacist dosing)   Does not apply See admin instructions   Continuous Infusions: . anidulafungin 100 mg (05/12/19 1009)  . methocarbamol (ROBAXIN) IV     PRN Meds:.acetaminophen, diphenhydrAMINE, methocarbamol **OR** methocarbamol (ROBAXIN) IV, ondansetron **OR** ondansetron (ZOFRAN) IV, oxyCODONE, polyethylene glycol, sodium chloride flush  DVT prophylaxis: SCDs Code Status: Full code Family Communication: d/w patient  Disposition Plan:  Likely discharge home on 05/19/2019 once patient has completed IV antibiotic/antifungal course as above  Consultants:   ID  Orthopedic surgery  Cardiology for TEE  Procedures:   2D echo IMPRESSIONS  1. The left ventricle has hyperdynamic systolic function, with an ejection fraction of >65%. The cavity size was normal. Left ventricular diastolic parameters were normal.  2. The aortic valve is tricuspid. No stenosis of the aortic valve.  3. The aortic root is normal in size and structure.  4. The right ventricle has normal systolic function. The cavity was normal. There is no increase in right ventricular wall thickness.  5. No evidence of mitral valve stenosis. No mitral regurgitation noted.  6. Normal IVC size. No complete TR doppler jet so unable to estimate PA systolic pressure.  7. No valvular vegetation noted.   TEE 1. The left ventricle has normal systolic function, with an ejection fraction of 60-65%. The cavity size was normal. No evidence of left ventricular regional wall motion abnormalities.  2. The right ventricle has normal systolc function. The cavity was normal. There is no increase in right ventricular wall thickness.  3. No evidence of a thrombus present in the left atrial appendage.  4. No mitral valve vegetation visualized.  5. The aortic valve is tricuspid.  6. The aortic root is normal in size and structure.  7. No vegetations, no endocarditis.  Antimicrobials:  Vancomycin 8/28  Anidulafungin 9/2  Objective: Vitals:   05/12/19 0542 05/12/19 1455 05/12/19 2134 05/13/19 0411  BP: 105/71 119/76 113/63 105/65  Pulse: 71 78 79 74  Resp: 17 16 14 17   Temp: 98 F (36.7 C) 98.4 F (36.9 C) 98.3 F (36.8 C) 98.6 F (37 C)  TempSrc: Oral Oral Oral Oral  SpO2: 100% 100% 100% 99%  Weight:      Height:       No intake or output data in the 24 hours ending 05/13/19 0741 Filed Weights   04/27/19 2257 04/27/19 2318 05/03/19 1320  Weight: 75 kg 77.1 kg 77 kg     Examination:  General:  Pleasantly resting in bed, No acute distress. HEENT:  Normocephalic atraumatic.  Sclerae nonicteric, noninjected.  Extraocular movements intact bilaterally. Neck:  Without mass or deformity.  Trachea is midline. Lungs:  Clear to auscultate bilaterally without rhonchi, wheeze, or rales. Heart:  Regular rate and rhythm.  Without murmurs, rubs, or gallops. Abdomen:  Soft, nontender, nondistended.  Without guarding or rebound. Extremities: Right upper extremity erythema improving, bandage clean dry intact.  Left upper extremity proximally and laterally noted to be warm, blanching erythema and palpable cord concerning for phlebitis. Vascular:  Dorsalis pedis and posterior tibial pulses palpable bilaterally.  Data Reviewed: I have independently reviewed following labs and imaging studies   CBC: No results for input(s): WBC, NEUTROABS, HGB, HCT, MCV, PLT in the last 168 hours. Basic Metabolic Panel: Recent Labs  Lab 05/08/19 0146 05/09/19 0701 05/11/19 0756  NA 138 134* 135  K 4.1 4.2 4.1  CL 105 100 102  CO2 26 24 24   GLUCOSE 109* 111* 111*  BUN 12 10 14   CREATININE 0.98 0.85 0.96  CALCIUM 9.0 8.9 8.8*   Liver Function Tests: Recent Labs  Lab 05/07/19 1315 05/08/19 0146 05/09/19 0701 05/11/19 0756  AST 236* 210* 234* 245*  ALT 377* 341* 381* 428*  ALKPHOS 180* 157* 173* 197*  BILITOT 0.9 0.7 0.9 0.9  PROT 7.6 6.7 6.9 7.5  ALBUMIN 3.3* 3.0* 3.2* 3.3*  Urine analysis:    Component Value Date/Time   COLORURINE YELLOW 02/09/2011 2210   APPEARANCEUR CLOUDY (A) 02/09/2011 2210   LABSPEC 1.025 02/09/2011 2210   PHURINE 7.0 02/09/2011 2210   GLUCOSEU NEGATIVE 02/09/2011 2210   HGBUR NEGATIVE 02/09/2011 2210   BILIRUBINUR NEGATIVE 02/09/2011 2210   KETONESUR NEGATIVE 02/09/2011 2210   PROTEINUR NEGATIVE 02/09/2011 2210   UROBILINOGEN 1.0 02/09/2011 2210   NITRITE NEGATIVE 02/09/2011 2210   LEUKOCYTESUR NEGATIVE 02/09/2011 2210   Radiology  Studies: No results found. Holli Humbles DO [facility]  Contact via  [email or web address]  [place] P: [phone] F: [phone]

## 2019-05-13 NOTE — Progress Notes (Signed)
[facility]   Reason for visit: Follow up on MRSA sepsis/fungemia with RT forearm abscess  Antibiotics: Vancomycin, day 15 + 2 days of zyvox Anidulafungin, day 13  Interval History: The patient briefly lost IV access over the weekend, prompting assessment by the IV/vascular access team. The vascular tech/nurse refused to place a PICC line in the patient yet told him he shouldn't receive IV vancomycin via a peripheral IV (despite the fact that he had been tolerating IV vancomycin well for over 2 weeks prior to loss of his IV). He is now refusing vancomycin, so we had to bridge his treatment with PO zyvox over the weekend despite our team's preference for him to resume vancomycin. His care has been discussed with multiple pharmacists and Dr. Natale MilchLancaster over the past several days. In further discussion with him, his anidulafungin actually infiltrated rather than his vanc, which would explain the lack of skin necrosis and rapid decrease in erythema to his LT forearm. Fever curves, WBC trends, cx results, ABX usage, imaging, and OP notes all independently reviewed    Current Facility-Administered Medications:  .  acetaminophen (TYLENOL) tablet 325-650 mg, 325-650 mg, Oral, Q6H PRN, Jake BatheSkains, Mark C, MD, 650 mg at 05/10/19 2112 .  anidulafungin (ERAXIS) 100 mg in sodium chloride 0.9 % 100 mL IVPB, 100 mg, Intravenous, Q24H, Della GooSinclair, Emily S, RPH, Last Rate: 78 mL/hr at 05/13/19 1108, 100 mg at 05/13/19 1108 .  diphenhydrAMINE (BENADRYL) 12.5 MG/5ML elixir 12.5-25 mg, 12.5-25 mg, Oral, Q4H PRN, Jake BatheSkains, Mark C, MD, 25 mg at 05/10/19 2241 .  docusate sodium (COLACE) capsule 100 mg, 100 mg, Oral, BID, Jake BatheSkains, Mark C, MD, 100 mg at 05/13/19 1106 .  gabapentin (NEURONTIN) capsule 300 mg, 300 mg, Oral, TID, Jake BatheSkains, Mark C, MD, 300 mg at 05/13/19 1106 .  methocarbamol (ROBAXIN) tablet 500 mg, 500 mg, Oral, Q6H PRN, 500 mg at 05/13/19 1106 **OR** methocarbamol (ROBAXIN) 500 mg in  dextrose 5 % 50 mL IVPB, 500 mg, Intravenous, Q6H PRN, Skains, Mark C, MD .  ondansetron (ZOFRAN) tablet 4 mg, 4 mg, Oral, Q6H PRN **OR** ondansetron (ZOFRAN) injection 4 mg, 4 mg, Intravenous, Q6H PRN, Skains, Mark C, MD .  oxyCODONE (Oxy IR/ROXICODONE) immediate release tablet 5-10 mg, 5-10 mg, Oral, Q4H PRN, Leatha GildingGherghe, Costin M, MD, 10 mg at 05/13/19 1106 .  polyethylene glycol (MIRALAX / GLYCOLAX) packet 17 g, 17 g, Oral, Daily PRN, Skains, Mark C, MD .  sodium chloride flush (NS) 0.9 % injection 10-40 mL, 10-40 mL, Intracatheter, PRN, Jake BatheSkains, Mark C, MD .  vancomycin variable dose per unstable renal function (pharmacist dosing), , Does not apply, See admin instructions, Azucena FallenLancaster, William C, MD   Physical Exam:   Vitals:   05/12/19 2134 05/13/19 0411  BP: 113/63 105/65  Pulse: 79 74  Resp: 14 17  Temp: 98.3 F (36.8 C) 98.6 F (37 C)  SpO2: 100% 99%   Physical Exam Gen: pleasant, NAD, A&O x 3 Head: NCAT, no temporal wasting evident EENT: PERRL, EOMI, MMM, adequate dentition Neck: supple, no JVD CV: NRRR, no murmurs appreciated Pulm: CTA bilaterally, mild expiratory wheeze, no retractions Abd: soft, NTND, +BS (hypoactive) Extrems: sight focal edema along his LT upper arm at the site of a prior PIV without erythema or drainage evident, no LE edema, 2+ pulses Skin: multiple tattoos, adequate skin turgor MSK: RT forearm & elbow incisions healing well with stitches intact and no surrounding drainage or erythema Neuro: CN II-XII grossly intact, no  focal neurologic deficits appreciated, gait was not assessed, A&Ox 3   Review of Systems:  Review of Systems  Constitutional: Negative for chills, fever and weight loss.  HENT: Negative for congestion, hearing loss, sinus pain and sore throat.   Eyes: Negative for blurred vision, photophobia and discharge.  Respiratory: Negative for cough, hemoptysis and shortness of breath.   Cardiovascular: Negative for chest pain, palpitations,  orthopnea and leg swelling.  Gastrointestinal: Negative for abdominal pain, constipation, diarrhea, heartburn, nausea and vomiting.  Genitourinary: Negative for dysuria, flank pain, frequency and urgency.  Musculoskeletal: Positive for joint pain. Negative for back pain and myalgias.       RT arm/elbow  Skin: Negative for itching and rash.  Neurological: Negative for tremors, seizures, weakness and headaches.  Endo/Heme/Allergies: Negative for polydipsia. Does not bruise/bleed easily.  Psychiatric/Behavioral: Positive for substance abuse. Negative for depression. The patient is nervous/anxious. The patient does not have insomnia.      Lab Results  Component Value Date   WBC 7.7 05/13/2019   HGB 10.7 (L) 05/13/2019   HCT 34.3 (L) 05/13/2019   MCV 83.9 05/13/2019   PLT 254 05/13/2019    Lab Results  Component Value Date   CREATININE 0.96 05/13/2019   BUN 9 05/13/2019   NA 137 05/13/2019   K 4.0 05/13/2019   CL 104 05/13/2019   CO2 24 05/13/2019    Lab Results  Component Value Date   ALT 539 (H) 05/13/2019   AST 374 (H) 05/13/2019   ALKPHOS 188 (H) 05/13/2019     Microbiology: No results found for this or any previous visit (from the past 240 hour(s)).  Impression/Plan: Patient is a 33 year old white male IV drug user admitted for MRSA sepsis/fungemia and right forearm abscess, status post debridement.  1.  MRSA sepsis- patient's initial blood cultures were positive for MRSA.  Continue vancomycin dosed for goal trough of 15-20 for now.  TTE and now TEE did not show evidence of endocarditis and repeat blood cultures thus far are showing no growth. If he continues to show improvement with his right forearm abscess and fungemia is simple (without IE), can limit antibiotic treatment to 3 weeks total.  As he now will require an IV antifungal, there is likely limited benefit in pursuing transition to PO zyvox.  His platelet count continues to rebound on recent labs.  Following a  prolonged discussion with the patient today regarding events of the weekend, he is agreeable to reinitiation of vancomycin to complete his antibacterial course on May 19, 2019.  We will give Benadryl as a premedication given the localized erythema that he had but do not plan on transitioning to Zyvox unless the patient is found to have a true vancomycin infiltration.  2. Fungemia - New growth of Candida glabrata noted, so continue anidulafungin. F/u repeat blood cxs were negative. As he now has 2 high risk pathogen noted for endocarditis, he underwent TEE which showed no evidence of endocarditis, so will plan for 2 weeks of anidulafungin with tentative d/c date for anidulafungin is 05/15/2019. Unfortunately, Candida glabrata is often resistant to azoles, so anticipate he will remain an inpt for completion of parenteral antimicrobials..  3. RT forearm abscess -appreciate orthopedics debriding the patient's wound on April 28, 2019.  As significant improvement is now confirmed, will plan to limit vanc arm of treatment to 3 weeks in total from the time of his right forearm debridement, as his TEE is also unrevealing. Tentative ABX d/c date is 05/19/2019.  4.  IV drug use- HIV, hep B and hep C serologies were all negative in June 2020. He was found to have rebound transaminitis over the weekend with repeat HCV Ab that are now positive. His clinical picture is most consistent with acute HCV infection at this time rather than medication effect from anidulafungin used to treat his fungemia.  Confirmatory labs show he has hepatitis C infection with genotype 3 and acute viremia of 4,860,000 IU.  He has had slight improvement in his LFTs to the 400 range after peaking in the 500s more than a week ago, so continue anidulafungin at current dose for completion of tx. I counseled the patient extensively regarding the relationship between his current infection and his ongoing IV drug use particularly with reuse of  needles for multiple injections. Pt had visitor two weeks ago that was actively under the influence of illicit substances. Nursing reports concern that he was smoking in his room as well. Monitor closely for further illicit substance use as an inpt given these issues.  Nursing staff was made aware and may need to limit visitors should there be any persistent concerns moving forward.  Patient may follow-up with [place] as an outpatient for his hepatitis C in 6 months time for repeat hepatitis C viral load testing to assess for chronic viremia at that time and subsequent need for treatment.  He stands a 25% chance of immunologically eradicating his own infection within that window.

## 2019-05-14 NOTE — Progress Notes (Signed)
PROGRESS NOTE  Jon Castro BZM:[id number] DOB: [birth year] DOA: 04/27/2019 PCP: Patient, [name] Per   LOS: 16 days   Brief Narrative / Interim history: 32 year old male with history of IV drug use, chronic pain, depression with recent suicide attempt was admitted to the hospital on 04/27/2019 due to a right forearm lesion/abscess after using IV drugs.  Orthopedic surgery was consulted and patient underwent incision/drainage in the OR.  His blood cultures showed MRSA as well as Candida glabrata.  ID following  Subjective: No acute issues or events overnight, no complaints this morning, no abdominal pain, no nausea or vomiting.  Arm pain is markedly improving, range of motion is also improving.  Assessment & Plan: Principal Problem:   Blood culture positive for Candida species Active Problems:   Abscess of right forearm   MRSA bacteremia   Fungemia   MDD (major depressive disorder), severe (HCC)   Opioid dependence with opioid-induced mood disorder (HCC)   Hyperglycemia   Leucocytosis   LFTs abnormal   IVDU (intravenous drug user)   Right forearm abscess, MRSA bacteremia and Candida glabrata positive blood cultures, POA -Fever overnight on 05/10/2019 x1 -likely in the setting of vancomycin infiltration and pain -s/p incision and drainage in the OR 04/28/2019, appreciate orthopedic surgery follow-up -ID following, currently on anidulafungin as well as vancomycin -TTE/TEE - without evidence of vegetations -ID recommends IV anidulafungin with tentative DC date on 05/16/2019, and vancomycin/linezolid for his MRSA bacteremia 05/19/2019 - plan for DC home that day. -IV ACCESS ISSUES 05/10/19-05/11/19 -resolved - continue IV vancomycin and anidulafungin as scheduled  Left upper arm blanching erythema, infiltrate from IV, resolving -Likely in the setting of IV infiltration previously, patient has a palpable vessel in the left upper extremity concerning for phlebitis -treat symptomatically,  NSAIDs, heat, ice -Erythema again likely reactive -less likely to be infectious process -resolving  Depression with anxiety/history of suicidal ideation -He just had a stay at behavioral health prior to this admission. Currently stable  Elevated LFTs likely in the setting of Hepatitis C infection, ongoing -Hepatitis panel remarkable for HCV antibody positive  -Hepatitis genotype 3, HCV quantitative 4,860,000; quantitative log 6.87 -Initially thought to be reactive to injected illicit drugs vs antifungal medication; however given HCV Ab this is likely to be the culprit given improving labs with antifungals ongoing and cessation of illicit substances -Right upper quadrant ultrasound was unremarkable for liver lesions.  -HIV negative -Liver panel continues to worsen over the past few days, patient will complete antifungal therapy on the 16th; will follow closely after completion of antifungals  Scheduled Meds:  docusate sodium  100 mg Oral BID   gabapentin  300 mg Oral TID   Continuous Infusions:  anidulafungin 100 mg (05/13/19 1108)   methocarbamol (ROBAXIN) IV     vancomycin 1,250 mg (05/14/19 0512)   PRN Meds:.acetaminophen, diphenhydrAMINE, methocarbamol **OR** methocarbamol (ROBAXIN) IV, ondansetron **OR** ondansetron (ZOFRAN) IV, oxyCODONE, polyethylene glycol, sodium chloride flush  DVT prophylaxis: SCDs Code Status: Full code Family Communication: d/w patient  Disposition Plan: Likely discharge home on 05/19/2019 once patient has completed IV antibiotic/antifungal course as above  Consultants:   ID  Orthopedic surgery  Cardiology for TEE  Procedures:   2D echo IMPRESSIONS  1. The left ventricle has hyperdynamic systolic function, with an ejection fraction of >65%. The cavity size was normal. Left ventricular diastolic parameters were normal.  2. The aortic valve is tricuspid. No stenosis of the aortic valve.  3. The aortic root is normal in size and  structure.   4. The right ventricle has normal systolic function. The cavity was normal. There is no increase in right ventricular wall thickness.  5. No evidence of mitral valve stenosis. No mitral regurgitation noted.  6. Normal IVC size. No complete TR doppler jet so unable to estimate PA systolic pressure.  7. No valvular vegetation noted.   TEE 1. The left ventricle has normal systolic function, with an ejection fraction of 60-65%. The cavity size was normal. No evidence of left ventricular regional wall motion abnormalities.  2. The right ventricle has normal systolc function. The cavity was normal. There is no increase in right ventricular wall thickness.  3. No evidence of a thrombus present in the left atrial appendage.  4. No mitral valve vegetation visualized.  5. The aortic valve is tricuspid.  6. The aortic root is normal in size and structure.  7. No vegetations, no endocarditis.  Antimicrobials:  Vancomycin 8/28  Anidulafungin 9/2  Objective: Vitals:   05/13/19 0411 05/13/19 1704 05/13/19 2137 05/14/19 0518  BP: 105/65 112/68 115/62 100/68  Pulse: 74 72 71 74  Resp: 17 18 18 19   Temp: 98.6 F (37 C) 97.9 F (36.6 C) 98.2 F (36.8 C) 98.6 F (37 C)  TempSrc: Oral Oral Oral Oral  SpO2: 99% 100% 100% 100%  Weight:      Height:        Intake/Output Summary (Last 24 hours) at 05/14/2019 0747 Last data filed at 05/14/2019 0512 Gross per 24 hour  Intake 1860 ml  Output --  Net 1860 ml   Filed Weights   04/27/19 2257 04/27/19 2318 05/03/19 1320  Weight: 75 kg 77.1 kg 77 kg    Examination:  General:  Pleasantly resting in bed, No acute distress. HEENT:  Normocephalic atraumatic.  Sclerae nonicteric, noninjected.  Extraocular movements intact bilaterally. Neck:  Without mass or deformity.  Trachea is midline. Lungs:  Clear to auscultate bilaterally without rhonchi, wheeze, or rales. Heart:  Regular rate and rhythm.  Without murmurs, rubs, or gallops. Abdomen:  Soft,  nontender, nondistended.  Without guarding or rebound. Extremities: Right upper extremity erythema improving, bandage clean dry intact.  Left upper extremity proximally and laterally noted to be warm, minimal blanching erythema and palpable cord concerning for phlebitis. Vascular:  Dorsalis pedis and posterior tibial pulses palpable bilaterally.  Data Reviewed: I have independently reviewed following labs and imaging studies   CBC: Recent Labs  Lab 05/13/19 0807  WBC 7.7  HGB 10.7*  HCT 34.3*  MCV 83.9  PLT 144   Basic Metabolic Panel: Recent Labs  Lab 05/08/19 0146 05/09/19 0701 05/11/19 0756 05/13/19 0807  NA 138 134* 135 137  K 4.1 4.2 4.1 4.0  CL 105 100 102 104  CO2 26 24 24 24   GLUCOSE 109* 111* 111* 208*  BUN 12 10 14 9   CREATININE 0.98 0.85 0.96 0.96  CALCIUM 9.0 8.9 8.8* 8.6*   Liver Function Tests: Recent Labs  Lab 05/07/19 1315 05/08/19 0146 05/09/19 0701 05/11/19 0756 05/13/19 0807  AST 236* 210* 234* 245* 374*  ALT 377* 341* 381* 428* 539*  ALKPHOS 180* 157* 173* 197* 188*  BILITOT 0.9 0.7 0.9 0.9 0.8  PROT 7.6 6.7 6.9 7.5 6.4*  ALBUMIN 3.3* 3.0* 3.2* 3.3* 2.9*   Urine analysis:    Component Value Date/Time   COLORURINE YELLOW 02/09/2011 2210   APPEARANCEUR CLOUDY (A) 02/09/2011 2210   LABSPEC 1.025 02/09/2011 2210   PHURINE 7.0 02/09/2011 2210   GLUCOSEU  NEGATIVE 02/09/2011 2210   HGBUR NEGATIVE 02/09/2011 2210   BILIRUBINUR NEGATIVE 02/09/2011 2210   KETONESUR NEGATIVE 02/09/2011 2210   PROTEINUR NEGATIVE 02/09/2011 2210   UROBILINOGEN 1.0 02/09/2011 2210   NITRITE NEGATIVE 02/09/2011 2210   LEUKOCYTESUR NEGATIVE 02/09/2011 2210   Radiology Studies: No results found. Carma LeavenWilliam Larine Fielding DO [facility]  Contact via  [email or web address]  [facility] Office Info P: [phone] F: [phone]

## 2019-05-15 LAB — VANCOMYCIN, PEAK: Vancomycin Pk: 38 ug/mL (ref 30–40)

## 2019-05-15 NOTE — Progress Notes (Signed)
PROGRESS NOTE  Jon Castro IEP:[id number] DOB: 1985-09-17 DOA: 04/27/2019 PCP: Patient, [name] Per   LOS: 17 days   Brief Narrative / Interim history: 33 year old male with history of IV drug use, chronic pain, depression with recent suicide attempt was admitted to the hospital on 04/27/2019 due to a right forearm lesion/abscess after using IV drugs.  Orthopedic surgery was consulted and patient underwent incision/drainage in the OR.  His blood cultures showed MRSA as well as Candida glabrata.  ID following  Subjective: No acute issues or events overnight, no complaints this morning, no abdominal pain, no nausea or vomiting.  Arm pain is markedly improving, range of motion is also improving.  Assessment & Plan: Principal Problem:   Blood culture positive for Candida species Active Problems:   Abscess of right forearm   MRSA bacteremia   Fungemia   MDD (major depressive disorder), severe (HCC)   Opioid dependence with opioid-induced mood disorder (HCC)   Hyperglycemia   Leucocytosis   LFTs abnormal   IVDU (intravenous drug user)   Right forearm abscess, MRSA bacteremia and Candida glabrata positive blood cultures, POA -Fever overnight on 05/10/2019 x1 -likely in the setting of vancomycin infiltration and pain -s/p incision and drainage in the OR 04/28/2019, appreciate orthopedic surgery follow-up -ID following, currently on anidulafungin as well as vancomycin -TTE/TEE - without evidence of vegetations -ID recommends IV anidulafungin with tentative DC date on 05/16/2019, and vancomycin/linezolid for his MRSA bacteremia 05/19/2019 - plan for DC home that day. -IV ACCESS ISSUES 05/10/19-05/11/19 -resolved - continue IV vancomycin and anidulafungin as scheduled  Left upper arm blanching erythema, infiltrate from IV, resolving -Likely in the setting of IV infiltration previously, patient has a palpable vessel in the left upper extremity concerning for phlebitis -treat symptomatically,  NSAIDs, heat, ice -Erythema again likely reactive -less likely to be infectious process -resolving  Depression with anxiety/history of suicidal ideation -He just had a stay at behavioral health prior to this admission. Currently stable  Elevated LFTs likely in the setting of Hepatitis C infection, ongoing -Hepatitis panel remarkable for HCV antibody positive  -Hepatitis genotype 3, HCV quantitative 4,860,000; quantitative log 6.87 -Initially thought to be reactive to injected illicit drugs vs antifungal medication; however given HCV Ab this is likely to be the culprit given improving labs with antifungals ongoing and cessation of illicit substances -Right upper quadrant ultrasound was unremarkable for liver lesions.  -HIV negative -Liver panel continues to worsen over the past few draws, patient will complete antifungal therapy on the 16th; will follow closely after completion of antifungals - again likely in the setting of acute/subacute hepatitis  Scheduled Meds:  docusate sodium  100 mg Oral BID   gabapentin  300 mg Oral TID   Continuous Infusions:  anidulafungin 100 mg (05/14/19 0906)   methocarbamol (ROBAXIN) IV     vancomycin 1,250 mg (05/15/19 0517)   PRN Meds:.acetaminophen, diphenhydrAMINE, methocarbamol **OR** methocarbamol (ROBAXIN) IV, ondansetron **OR** ondansetron (ZOFRAN) IV, oxyCODONE, polyethylene glycol, sodium chloride flush  DVT prophylaxis: SCDs Code Status: Full code Family Communication: d/w patient  Disposition Plan: Likely discharge home on 05/19/2019 once patient has completed IV antibiotic/antifungal course as above  Consultants:   ID  Orthopedic surgery  Cardiology for TEE  Procedures:   2D echo IMPRESSIONS  1. The left ventricle has hyperdynamic systolic function, with an ejection fraction of >65%. The cavity size was normal. Left ventricular diastolic parameters were normal.  2. The aortic valve is tricuspid. No stenosis of the aortic  valve.  3. The aortic root is normal in size and structure.  4. The right ventricle has normal systolic function. The cavity was normal. There is no increase in right ventricular wall thickness.  5. No evidence of mitral valve stenosis. No mitral regurgitation noted.  6. Normal IVC size. No complete TR doppler jet so unable to estimate PA systolic pressure.  7. No valvular vegetation noted.   TEE 1. The left ventricle has normal systolic function, with an ejection fraction of 60-65%. The cavity size was normal. No evidence of left ventricular regional wall motion abnormalities.  2. The right ventricle has normal systolc function. The cavity was normal. There is no increase in right ventricular wall thickness.  3. No evidence of a thrombus present in the left atrial appendage.  4. No mitral valve vegetation visualized.  5. The aortic valve is tricuspid.  6. The aortic root is normal in size and structure.  7. No vegetations, no endocarditis.  Antimicrobials:  Vancomycin 8/28  Anidulafungin 9/2  Objective: Vitals:   05/14/19 0518 05/14/19 1436 05/14/19 2111 05/15/19 0537  BP: 100/68 102/63 116/65 113/61  Pulse: 74 75 92   Resp: 19 17    Temp: 98.6 F (37 C) 98.3 F (36.8 C) 99.5 F (37.5 C) 98.4 F (36.9 C)  TempSrc: Oral Oral Oral Oral  SpO2: 100% 99% 100% 100%  Weight:      Height:        Intake/Output Summary (Last 24 hours) at 05/15/2019 0751 Last data filed at 05/15/2019 0636 Gross per 24 hour  Intake 1803.66 ml  Output --  Net 1803.66 ml   Filed Weights   04/27/19 2257 04/27/19 2318 05/03/19 1320  Weight: 75 kg 77.1 kg 77 kg    Examination:  General:  Pleasantly resting in bed, No acute distress. HEENT:  Normocephalic atraumatic.  Sclerae nonicteric, noninjected.  Extraocular movements intact bilaterally. Neck:  Without mass or deformity.  Trachea is midline. Lungs:  Clear to auscultate bilaterally without rhonchi, wheeze, or rales. Heart:  Regular rate and  rhythm.  Without murmurs, rubs, or gallops. Abdomen:  Soft, nontender, nondistended.  Without guarding or rebound. Extremities: Right upper extremity erythema improving, bandage clean dry intact.  Left upper extremity proximally and laterally noted to be warm, minimal blanching erythema and palpable cord concerning for phlebitis. Vascular:  Dorsalis pedis and posterior tibial pulses palpable bilaterally.  Data Reviewed: I have independently reviewed following labs and imaging studies   CBC: Recent Labs  Lab 05/13/19 0807  WBC 7.7  HGB 10.7*  HCT 34.3*  MCV 83.9  PLT 254   Basic Metabolic Panel: Recent Labs  Lab 05/09/19 0701 05/11/19 0756 05/13/19 0807  NA 134* 135 137  K 4.2 4.1 4.0  CL 100 102 104  CO2 24 24 24   GLUCOSE 111* 111* 208*  BUN 10 14 9   CREATININE 0.85 0.96 0.96  CALCIUM 8.9 8.8* 8.6*   Liver Function Tests: Recent Labs  Lab 05/09/19 0701 05/11/19 0756 05/13/19 0807  AST 234* 245* 374*  ALT 381* 428* 539*  ALKPHOS 173* 197* 188*  BILITOT 0.9 0.9 0.8  PROT 6.9 7.5 6.4*  ALBUMIN 3.2* 3.3* 2.9*   Urine analysis:    Component Value Date/Time   COLORURINE YELLOW 02/09/2011 2210   APPEARANCEUR CLOUDY (A) 02/09/2011 2210   LABSPEC 1.025 02/09/2011 2210   PHURINE 7.0 02/09/2011 2210   GLUCOSEU NEGATIVE 02/09/2011 2210   HGBUR NEGATIVE 02/09/2011 2210   BILIRUBINUR NEGATIVE 02/09/2011 2210   KETONESUR  NEGATIVE 02/09/2011 2210   PROTEINUR NEGATIVE 02/09/2011 2210   UROBILINOGEN 1.0 02/09/2011 2210   NITRITE NEGATIVE 02/09/2011 2210   LEUKOCYTESUR NEGATIVE 02/09/2011 2210   Radiology Studies: No results found.   Time Spent: >6225min  Carma LeavenWilliam Anyjah Roundtree DO [facility]  Contact via  [email or web address]

## 2019-05-15 NOTE — Plan of Care (Signed)

## 2019-05-15 NOTE — Plan of Care (Signed)
  Problem: Education: Goal: Knowledge of General Education information will improve Description: Including pain rating scale, medication(s)/side effects and non-pharmacologic comfort measures Outcome: Progressing   Problem: Health Behavior/Discharge Planning: Goal: Ability to manage health-related needs will improve Outcome: Progressing   Problem: Clinical Measurements: Goal: Ability to maintain clinical measurements within normal limits will improve Outcome: Progressing Goal: Respiratory complications will improve Outcome: Progressing   Problem: Activity: Goal: Risk for activity intolerance will decrease Outcome: Progressing   Problem: Nutrition: Goal: Adequate nutrition will be maintained Outcome: Progressing   Problem: Coping: Goal: Level of anxiety will decrease Outcome: Progressing   Problem: Pain Managment: Goal: General experience of comfort will improve Outcome: Progressing   Problem: Safety: Goal: Ability to remain free from injury will improve Outcome: Progressing   Problem: Skin Integrity: Goal: Risk for impaired skin integrity will decrease Outcome: Progressing   

## 2019-05-16 DIAGNOSIS — R945 Abnormal results of liver function studies: Secondary | ICD-10-CM

## 2019-05-16 DIAGNOSIS — B1710 Acute hepatitis C without hepatic coma: Secondary | ICD-10-CM

## 2019-05-16 LAB — VANCOMYCIN, TROUGH: Vancomycin Tr: 7 ug/mL — ABNORMAL LOW (ref 15–20)

## 2019-05-16 MED ORDER — VANCOMYCIN HCL IN DEXTROSE 750-5 MG/150ML-% IV SOLN
750.0000 mg | Freq: Two times a day (BID) | INTRAVENOUS | Status: AC
  Administered 2019-05-16 – 2019-05-19 (×6): 750 mg via INTRAVENOUS
  Filled 2019-05-16 (×7): qty 150

## 2019-05-16 MED ORDER — ENOXAPARIN SODIUM 40 MG/0.4ML ~~LOC~~ SOLN
40.0000 mg | SUBCUTANEOUS | Status: DC
  Administered 2019-05-16: 40 mg via SUBCUTANEOUS
  Filled 2019-05-16 (×2): qty 0.4

## 2019-05-16 NOTE — Progress Notes (Signed)
PROGRESS NOTE    Jon Castro  [id number] DOB: Jul 14, 1986 DOA: 04/27/2019 PCP: Patient, [name] Per   Brief Narrative:  33 year old WM PMHx  IV drug use, chronic pain syndrome, depression, with recent suicide attempt on Suboxone at admission apparently unable to get the clinic and get his Suboxone so he used heroin today.  Since he was released from behavioral health he has noticed increase in size of a lesion on his right forearm with fever.  Admitted to the hospital on 04/27/2019 due to a right forearm lesion/abscess after using IV drugs.  Orthopedic surgery was consulted and patient underwent incision/drainage in the OR.  His blood cultures showed MRSA as well as Candida glabrata.  ID following   Subjective: 9/17 A/O x4- CP, negative S OB, negative abdominal pain.  Some irritation at site of PIV (receiving vancomycin).  States RIGHT forearm and elbow significantly improved minimal pain.   Assessment & Plan:   Principal Problem:   Blood culture positive for Candida species Active Problems:   MDD (major depressive disorder), severe (HCC)   Opioid dependence with opioid-induced mood disorder (HCC)   Abscess of right forearm   Hyperglycemia   Leucocytosis   LFTs abnormal   MRSA bacteremia   IVDU (intravenous drug user)   Fungemia   Right forearm abscess, MRSA bacteremia and Candida glabrata positive blood cultures, POA -Fever overnight on 05/10/2019 x1 -likely in the setting of vancomycin infiltration and pain -s/p incision and drainage in the OR 04/28/2019, appreciate orthopedic surgery follow-up -ID following, currently on anidulafungin as well as vancomycin -TTE/TEE - without evidence of vegetations -ID recommends IV anidulafungin with tentative DC date on 05/16/2019, and vancomycin/linezolid for his MRSA bacteremia 05/19/2019 - plan for DC home that day. -IV ACCESS ISSUES 05/10/19-05/11/19 -resolved - continue IV vancomycin and anidulafungin as scheduled  Left upper arm  blanching erythema, infiltrate from IV, resolving -Likely in the setting of IV infiltration previously, patient has a palpable vessel in the left upper extremity concerning for phlebitis -treat symptomatically, NSAIDs, heat, ice -Erythema again likely reactive -less likely to be infectious process -resolving  Depression with anxiety/history of suicidal ideation -He just had a stay at behavioral health prior to this admission. Currently stable  Elevated LFTs likely in the setting of Hepatitis C infection, ongoing -Hepatitis panel remarkable for HCV antibody positive  -Hepatitis genotype 3, HCV quantitative 4,860,000; quantitative log 6.87 -Initially thought to be reactive to injected illicit drugs vs antifungal medication; however given HCV Ab this is likely to be the culprit given improving labs with antifungals ongoing and cessation of illicit substances -Right upper quadrant ultrasound was unremarkable for liver lesions.  -HIV negative -Liver panel continues to worsen over the past few draws, patient will complete antifungal therapy on the 16th; will follow closely after completion of antifungals - again likely in the setting of acute/subacute hepatitis    DVT prophylaxis: Lovenox Code Status: Full Family Communication: None Disposition Plan: TBD   Consultants:    Procedures/Significant Events:  8/31 echocardiogram:The left ventricle has hyperdynamic systolic function, with an ejection fraction of >65%. The cavity size was normal. Left ventricular diastolic parameters were normal. 2. The aortic valve is tricuspid. No stenosis of the aortic valve. 3. The aortic root is normal in size and structure. 4. The right ventricle has normal systolic function. The cavity was normal. There is no increase in right ventricular wall thickness. 5. No evidence of mitral valve stenosis. No mitral regurgitation noted. 6. Normal IVC size. No complete TR  doppler jet so unable to estimate PA systolic  pressure. 7. No valvular vegetation noted  9/4 TEE:The left ventricle has normal systolic function, with an ejection fraction of 60-65%. The cavity size was normal. No evidence of left ventricular regional wall motion abnormalities. 2. The right ventricle has normal systolc function. The cavity was normal. There is no increase in right ventricular wall thickness. 3. No evidence of a thrombus present in the left atrial appendage. 4. No mitral valve vegetation visualized. 5. The aortic valve is tricuspid. 6. The aortic root is normal in size and structure. 7. No vegetations, no endocardit   I have personally reviewed and interpreted all radiology studies and my findings are as above.  VENTILATOR SETTINGS:    Cultures   Antimicrobials: Anti-infectives (From admission, onward)   Start     Stop   05/16/19 2000  vancomycin (VANCOCIN) IVPB 750 mg/150 ml premix     05/19/19 1959   05/14/19 0500  vancomycin (VANCOCIN) 1,250 mg in sodium chloride 0.9 % 250 mL IVPB  Status:  Discontinued     05/16/19 1052   05/13/19 1600  vancomycin (VANCOCIN) 1,500 mg in sodium chloride 0.9 % 500 mL IVPB     05/13/19 1928   05/13/19 1300  linezolid (ZYVOX) tablet 600 mg     05/13/19 1345   05/12/19 1000  linezolid (ZYVOX) tablet 600 mg     05/12/19 2111   05/12/19 0859  vancomycin variable dose per unstable renal function (pharmacist dosing)  Status:  Discontinued     05/13/19 1524   05/11/19 1200  linezolid (ZYVOX) tablet 600 mg     05/11/19 2132   05/10/19 2330  linezolid (ZYVOX) tablet 600 mg     05/11/19 0053   05/08/19 1000  vancomycin (VANCOCIN) 1,250 mg in sodium chloride 0.9 % 250 mL IVPB  Status:  Discontinued     05/12/19 0921   05/02/19 1000  anidulafungin (ERAXIS) 100 mg in sodium chloride 0.9 % 100 mL IVPB     05/15/19 1148   05/01/19 2200  vancomycin (VANCOCIN) 1,500 mg in sodium chloride 0.9 % 500 mL IVPB  Status:  Discontinued     05/07/19 2338   05/01/19 0615  anidulafungin  (ERAXIS) 200 mg in sodium chloride 0.9 % 200 mL IVPB     05/01/19 1112   04/28/19 1929  ceFAZolin (ANCEF) 2-4 GM/100ML-% IVPB    Note to Pharmacy: [name]   : cabinet override   04/29/19 0744   04/28/19 0600  vancomycin (VANCOCIN) IVPB 1000 mg/200 mL premix  Status:  Discontinued     04/30/19 1617   04/27/19 2315  vancomycin (VANCOCIN) IVPB 1000 mg/200 mL premix     04/28/19 0154   04/27/19 2315  piperacillin-tazobactam (ZOSYN) IVPB 3.375 g     04/28/19 0017       Devices    LINES / TUBES:      Continuous Infusions: . methocarbamol (ROBAXIN) IV    . vancomycin 1,250 mg (05/15/19 1717)     Objective: Vitals:   05/15/19 0537 05/15/19 1500 05/15/19 2054 05/16/19 0537  BP: 113/61 117/70 117/72 116/67  Pulse:  80 92 80  Resp:  18 18 18   Temp: 98.4 F (36.9 C) 98.5 F (36.9 C) 98.8 F (37.1 C) 98.6 F (37 C)  TempSrc: Oral Oral Oral Oral  SpO2: 100% 100% 99% 99%  Weight:      Height:        Intake/Output Summary (Last 24 hours)  at 05/16/2019 16100821 Last data filed at 05/15/2019 1500 Gross per 24 hour  Intake 840 ml  Output -  Net 840 ml   Filed Weights   04/27/19 2257 04/27/19 2318 05/03/19 1320  Weight: 75 kg 77.1 kg 77 kg    Examination:  General: A/O x4 no acute respiratory distress Eyes: negative scleral hemorrhage, negative anisocoria, negative icterus ENT: Negative Runny nose, negative gingival bleeding, Neck:  Negative scars, masses, torticollis, lymphadenopathy, JVD Lungs: Clear to auscultation bilaterally without wheezes or crackles Cardiovascular: Regular rate and rhythm without murmur gallop or rub normal S1 and S2 Abdomen: negative abdominal pain, nondistended, positive soft, bowel sounds, no rebound, no ascites, no appreciable mass Extremities: Mild erythema/warmth RIGHT forearm and elbow.  Multiple sutures right forearm negative sign of drainage.  Skin: Irritation LEFT hand at site of PIV (receiving vancomycin)  Psychiatric:  Negative  depression, negative anxiety, negative fatigue, negative mania  Central nervous system:  Cranial nerves II through XII intact, tongue/uvula midline, all extremities muscle strength 5/5, sensation intact throughout,  negative dysarthria, negative expressive aphasia, negative receptive aphasia.  .     Data Reviewed: Care during the described time interval was provided by me .  I have reviewed this patient's available data, including medical history, events of note, physical examination, and all test results as part of my evaluation.   CBC: Recent Labs  Lab 05/13/19 0807  WBC 7.7  HGB 10.7*  HCT 34.3*  MCV 83.9  PLT 254   Basic Metabolic Panel: Recent Labs  Lab 05/11/19 0756 05/13/19 0807  NA 135 137  K 4.1 4.0  CL 102 104  CO2 24 24  GLUCOSE 111* 208*  BUN 14 9  CREATININE 0.96 0.96  CALCIUM 8.8* 8.6*   GFR: Estimated Creatinine Clearance: 118.1 mL/min (by C-G formula based on SCr of 0.96 mg/dL). Liver Function Tests: Recent Labs  Lab 05/11/19 0756 05/13/19 0807  AST 245* 374*  ALT 428* 539*  ALKPHOS 197* 188*  BILITOT 0.9 0.8  PROT 7.5 6.4*  ALBUMIN 3.3* 2.9*   No results for input(s): LIPASE, AMYLASE in the last 168 hours. No results for input(s): AMMONIA in the last 168 hours. Coagulation Profile: No results for input(s): INR, PROTIME in the last 168 hours. Cardiac Enzymes: No results for input(s): CKTOTAL, CKMB, CKMBINDEX, TROPONINI in the last 168 hours. BNP (last 3 results) No results for input(s): PROBNP in the last 8760 hours. HbA1C: No results for input(s): HGBA1C in the last 72 hours. CBG: No results for input(s): GLUCAP in the last 168 hours. Lipid Profile: No results for input(s): CHOL, HDL, LDLCALC, TRIG, CHOLHDL, LDLDIRECT in the last 72 hours. Thyroid Function Tests: No results for input(s): TSH, T4TOTAL, FREET4, T3FREE, THYROIDAB in the last 72 hours. Anemia Panel: No results for input(s): VITAMINB12, FOLATE, FERRITIN, TIBC, IRON,  RETICCTPCT in the last 72 hours. Urine analysis:    Component Value Date/Time   COLORURINE YELLOW 02/09/2011 2210   APPEARANCEUR CLOUDY (A) 02/09/2011 2210   LABSPEC 1.025 02/09/2011 2210   PHURINE 7.0 02/09/2011 2210   GLUCOSEU NEGATIVE 02/09/2011 2210   HGBUR NEGATIVE 02/09/2011 2210   BILIRUBINUR NEGATIVE 02/09/2011 2210   KETONESUR NEGATIVE 02/09/2011 2210   PROTEINUR NEGATIVE 02/09/2011 2210   UROBILINOGEN 1.0 02/09/2011 2210   NITRITE NEGATIVE 02/09/2011 2210   LEUKOCYTESUR NEGATIVE 02/09/2011 2210   Sepsis Labs: @LABRCNTIP (procalcitonin:4,lacticidven:4)  )No results found for this or any previous visit (from the past 240 hour(s)).       Radiology Studies:  No results found.      Scheduled Meds: . docusate sodium  100 mg Oral BID  . gabapentin  300 mg Oral TID   Continuous Infusions: . methocarbamol (ROBAXIN) IV    . vancomycin 1,250 mg (05/15/19 1717)     LOS: 18 days   The patient is critically ill with multiple organ systems failure and requires high complexity decision making for assessment and support, frequent evaluation and titration of therapies, application of advanced monitoring technologies and extensive interpretation of multiple databases. Critical Care Time devoted to patient care services described in this note  Time spent: 40 minutes     WOODS, Geraldo Docker, MD [facility] Pager [phone]  If 7PM-7AM, please contact night-coverage [email or web address] Password [email or web address] 05/16/2019, 8:21 AM

## 2019-05-16 NOTE — Progress Notes (Signed)
Pharmacy Antibiotic Note Jon Castro is a 33 y.o. male admitted on 04/27/2019 with RUE abcess/cellulitis and MRSA/candida bacteremia. Pharmacy has been consulted for vancomycin dosing.  Last Scr stable with CrCl>100 ml/min. afebrile,  WBC WNL- I&D 8/29, washout 8/30, TEE did not show evidence of endocarditis.  Patient lost IV access over the weekend and currently has a peripheral IV. ID pharmacist spoke with patient and he is okay with continuing vancomycin through the peripheral line instead of switching to linezolid. Vanc restarted 9/14. Levels done 9/16-9/17. Pt refused VT at 0430 but agreed to 0700. retimed next dose for 0800. VP 9/16 @ 2159 = 38, VT 9/17 @ 2263 = 7. Calc AUC 759.2.   Plan: Dec to 750 mg q12h, new AUC 454.5 Monitor renal function  Monitor levels as needed  Stop date for Vanc is 9/20  Height: 6\' 1"  (185.4 cm) Weight: 169 lb 12.1 oz (77 kg) IBW/kg (Calculated) : 79.9  Temp (24hrs), Avg:98.6 F (37 C), Min:98.5 F (36.9 C), Max:98.8 F (37.1 C)  Recent Labs  Lab 05/10/19 1301 05/11/19 0756 05/13/19 0807 05/15/19 2159 05/16/19 0757  WBC  --   --  7.7  --   --   CREATININE  --  0.96 0.96  --   --   VANCOTROUGH  --   --   --   --  7*  VANCOPEAK 7*  --   --  38  --     Estimated Creatinine Clearance: 118.1 mL/min (by C-G formula based on SCr of 0.96 mg/dL).    Allergies  Allergen Reactions  . Vicodin [Hydrocodone-Acetaminophen] Itching  . Codeine Nausea And Vomiting  . Tramadol Other (See Comments)    "messes with head"   Antimicrobials this admission:  9/12> Zyvox >> (5 doses while IV access missing) 9/13 8/29 Zosyn x 1  8/30 Vanc >> 9/11,9/14 9/2: Eraxis>>9/16  Microbiology results:  8/29 BCx: MRSA bacteremia, candida glabrata 8/31: BC x 2: ngtd 8/30 COVID: neg 8/30: MRSA PCR + 8/30 RUE abscess: MRSA 9/6 HIV screen: non reactive - Hep A IGM: negative - Heb B S Ag: negative - Hep B core IGM: negative - Hep C Ab: >11.0 - HCV Quant:  4,860,000 - HCV Quant Log: 6.687   Gwenlyn Fudge - Student PharmD 05/16/19 9:53 AM

## 2019-05-16 NOTE — Progress Notes (Signed)
Patient refused Vanc trough this morning and said he would get it drawn at 0700. Called pharmacy and they said they would reschedule his vancomycin for 0800.

## 2019-05-16 NOTE — Plan of Care (Signed)

## 2019-05-17 DIAGNOSIS — F419 Anxiety disorder, unspecified: Secondary | ICD-10-CM

## 2019-05-17 DIAGNOSIS — F320 Major depressive disorder, single episode, mild: Secondary | ICD-10-CM

## 2019-05-17 LAB — CBC
HCT: 35.3 % — ABNORMAL LOW (ref 39.0–52.0)
Hemoglobin: 11.7 g/dL — ABNORMAL LOW (ref 13.0–17.0)
MCH: 27.6 pg (ref 26.0–34.0)
MCHC: 33.1 g/dL (ref 30.0–36.0)
MCV: 83.3 fL (ref 80.0–100.0)
Platelets: 255 10*3/uL (ref 150–400)
RBC: 4.24 MIL/uL (ref 4.22–5.81)
RDW: 15.1 % (ref 11.5–15.5)
WBC: 6 10*3/uL (ref 4.0–10.5)
nRBC: 0 % (ref 0.0–0.2)

## 2019-05-17 LAB — BASIC METABOLIC PANEL
Anion gap: 4 — ABNORMAL LOW (ref 5–15)
BUN: 8 mg/dL (ref 6–20)
CO2: 31 mmol/L (ref 22–32)
Calcium: 9.1 mg/dL (ref 8.9–10.3)
Chloride: 104 mmol/L (ref 98–111)
Creatinine, Ser: 0.94 mg/dL (ref 0.61–1.24)
GFR calc Af Amer: >60 mL/min (ref >60)
GFR calc non Af Amer: >60 mL/min (ref >60)
Glucose, Bld: 122 mg/dL — ABNORMAL HIGH (ref 70–99)
Potassium: 4.1 mmol/L (ref 3.5–5.1)
Sodium: 139 mmol/L (ref 135–145)

## 2019-05-17 LAB — MAGNESIUM: Magnesium: 2 mg/dL (ref 1.7–2.4)

## 2019-05-17 MED ORDER — ONDANSETRON HCL 4 MG PO TABS: 4.0000 mg | ORAL_TABLET | Freq: Four times a day (QID) | ORAL | 0 refills | Status: DC | PRN

## 2019-05-17 MED ORDER — GABAPENTIN 300 MG PO CAPS: 300.0000 mg | ORAL_CAPSULE | Freq: Three times a day (TID) | ORAL | 0 refills | Status: DC

## 2019-05-17 MED ORDER — ACETAMINOPHEN 325 MG PO TABS: 325.0000 mg | ORAL_TABLET | Freq: Four times a day (QID) | ORAL | 0 refills | Status: DC | PRN

## 2019-05-17 MED ORDER — METHOCARBAMOL 500 MG PO TABS: 500.0000 mg | ORAL_TABLET | Freq: Four times a day (QID) | ORAL | 0 refills | Status: DC | PRN

## 2019-05-17 NOTE — TOC Progression Note (Signed)
Transition of Care [facility]) - Progression Note    Patient Details  Name: Bardia Wangerin MRN: [id number] Date of Birth: 02/12/1986  [facility] CM/SW Contact  Jacalyn Lefevre Edson Snowball, RN Phone Number: 05/17/2019, 10:57 AM  Clinical Narrative:     Patient for discharge 05/19/19. Patient uninsured. Provided [employer] and Wellness information.   Patient plans on staying with his "son's aunt " at discharge. She lives in [place] but is unsure of exact address.  He has transportation home at discharge.    Asked Attending to send prescriptions to [facility] pharmacy today if possible. NCM will enter patient in [facility] program. TOC will fill prescriptions today . Medication will have to go to main pharmacy and nurse pick up on day of discharge to give to patient. Bedside nurse and patient aware. Will place in hand off and on sticky note. Expected Discharge Plan: Home/Self Care Barriers to Discharge: Other (comment)(completion of IV ABX 05/19/19)  Expected Discharge Plan and Services Expected Discharge Plan: Home/Self Care In-house Referral: Financial Counselor Discharge Planning Services: CM Consult, [facility], [place], Medication Assistance   Living arrangements for the past 2 months: Single Family Home                 DME Arranged: N/A         HH Arranged: NA           Social Determinants of Health (SDOH) Interventions    Readmission Risk Interventions No flowsheet data found.

## 2019-05-17 NOTE — Progress Notes (Signed)
PROGRESS NOTE    Jon Castro  BHA:[id number] DOB: June 25, 1986 DOA: 04/27/2019 PCP: Patient, [name] Per   Brief Narrative:  33 year old WM PMHx  IV drug use, chronic pain syndrome, depression, with recent suicide attempt on Suboxone at admission apparently unable to get the clinic and get his Suboxone so he used heroin today.  Since he was released from behavioral health he has noticed increase in size of a lesion on his right forearm with fever.  Admitted to the hospital on 04/27/2019 due to a right forearm lesion/abscess after using IV drugs.  Orthopedic surgery was consulted and patient underwent incision/drainage in the OR.  His blood cultures showed MRSA as well as Candida glabrata.  ID following   Subjective: 9/18 A/O x4, negative CP, negative S OB, negative abdominal pain.  Patient ambulating around the ward without pain.  Some continued irritation at site of PIV (receiving vancomycin)   Assessment & Plan:   Principal Problem:   Blood culture positive for Candida species Active Problems:   MDD (major depressive disorder), severe (HCC)   Opioid dependence with opioid-induced mood disorder (HCC)   Abscess of right forearm   Hyperglycemia   Leucocytosis   LFTs abnormal   MRSA bacteremia   IVDU (intravenous drug user)   Fungemia   Right forearm abscess, MRSA bacteremia and Candida glabrata positive blood cultures, POA -Fever overnight on 05/10/2019 x1 -likely in the setting of vancomycin infiltration and pain -s/p incision and drainage in the OR 04/28/2019, appreciate orthopedic surgery follow-up -ID following, currently on anidulafungin as well as vancomycin -TTE/TEE - without evidence of vegetations -ID recommends IV anidulafungin with tentative DC date on 05/16/2019, and vancomycin/linezolid for his MRSA bacteremia 05/19/2019 - plan for DC home that day. -IV ACCESS ISSUES 05/10/19-05/11/19 -resolved - continue IV vancomycin and anidulafungin as scheduled  Left upper arm  blanching erythema, infiltrate from IV, resolving -Likely in the setting of IV infiltration previously, patient has a palpable vessel in the left upper extremity concerning for phlebitis -treat symptomatically, NSAIDs, heat, ice -Erythema again likely reactive -less likely to be infectious process -resolving  Depression with anxiety/history of suicidal ideation -He just had a stay at behavioral health prior to this admission. Currently stable  Elevated LFTs likely in the setting of Hepatitis C infection, ongoing -Hepatitis panel remarkable for HCV antibody positive  -Hepatitis genotype 3, HCV quantitative 4,860,000; quantitative log 6.87 -Initially thought to be reactive to injected illicit drugs vs antifungal medication; however given HCV Ab this is likely to be the culprit given improving labs with antifungals ongoing and cessation of illicit substances -Right upper quadrant ultrasound was unremarkable for liver lesions.  -HIV negative -Liver panel continues to worsen over the past few draws, patient will complete antifungal therapy on the 16th; will follow closely after completion of antifungals - again likely in the setting of acute/subacute hepatitis    DVT prophylaxis: Lovenox Code Status: Full Family Communication: None Disposition Plan: TBD   Consultants:  Cardiology    Procedures/Significant Events:  8/31 echocardiogram:The left ventricle has hyperdynamic systolic function, with an ejection fraction of >65%. The cavity size was normal. Left ventricular diastolic parameters were normal. 2. The aortic valve is tricuspid. No stenosis of the aortic valve. 3. The aortic root is normal in size and structure. 4. The right ventricle has normal systolic function. The cavity was normal. There is no increase in right ventricular wall thickness. 5. No evidence of mitral valve stenosis. No mitral regurgitation noted. 6. Normal IVC size. No  complete TR doppler jet so unable to  estimate PA systolic pressure. 7. No valvular vegetation noted  9/4 TEE:The left ventricle has normal systolic function, with an ejection fraction of 60-65%. The cavity size was normal. No evidence of left ventricular regional wall motion abnormalities. 2. The right ventricle has normal systolc function. The cavity was normal. There is no increase in right ventricular wall thickness. 3. No evidence of a thrombus present in the left atrial appendage. 4. No mitral valve vegetation visualized. 5. The aortic valve is tricuspid. 6. The aortic root is normal in size and structure. 7. No vegetations, no endocardit   I have personally reviewed and interpreted all radiology studies and my findings are as above.  VENTILATOR SETTINGS:    Cultures   Antimicrobials: Anti-infectives (From admission, onward)   Start     Stop   05/16/19 2000  vancomycin (VANCOCIN) IVPB 750 mg/150 ml premix     05/19/19 1959   05/14/19 0500  vancomycin (VANCOCIN) 1,250 mg in sodium chloride 0.9 % 250 mL IVPB  Status:  Discontinued     05/16/19 1052   05/13/19 1600  vancomycin (VANCOCIN) 1,500 mg in sodium chloride 0.9 % 500 mL IVPB     05/13/19 1928   05/13/19 1300  linezolid (ZYVOX) tablet 600 mg     05/13/19 1345   05/12/19 1000  linezolid (ZYVOX) tablet 600 mg     05/12/19 2111   05/12/19 0859  vancomycin variable dose per unstable renal function (pharmacist dosing)  Status:  Discontinued     05/13/19 1524   05/11/19 1200  linezolid (ZYVOX) tablet 600 mg     05/11/19 2132   05/10/19 2330  linezolid (ZYVOX) tablet 600 mg     05/11/19 0053   05/08/19 1000  vancomycin (VANCOCIN) 1,250 mg in sodium chloride 0.9 % 250 mL IVPB  Status:  Discontinued     05/12/19 0921   05/02/19 1000  anidulafungin (ERAXIS) 100 mg in sodium chloride 0.9 % 100 mL IVPB     05/15/19 1148   05/01/19 2200  vancomycin (VANCOCIN) 1,500 mg in sodium chloride 0.9 % 500 mL IVPB  Status:  Discontinued     05/07/19 2338   05/01/19  0615  anidulafungin (ERAXIS) 200 mg in sodium chloride 0.9 % 200 mL IVPB     05/01/19 1112   04/28/19 1929  ceFAZolin (ANCEF) 2-4 GM/100ML-% IVPB    Note to Pharmacy: Jon Castro   : cabinet override   04/29/19 0744   04/28/19 0600  vancomycin (VANCOCIN) IVPB 1000 mg/200 mL premix  Status:  Discontinued     04/30/19 1617   04/27/19 2315  vancomycin (VANCOCIN) IVPB 1000 mg/200 mL premix     04/28/19 0154   04/27/19 2315  piperacillin-tazobactam (ZOSYN) IVPB 3.375 g     04/28/19 0017       Devices    LINES / TUBES:      Continuous Infusions: . methocarbamol (ROBAXIN) IV    . vancomycin 750 mg (05/17/19 0829)     Objective: Vitals:   05/16/19 0537 05/16/19 1645 05/16/19 2152 05/17/19 0508  BP: 116/67 107/65 (!) 109/59 (!) 107/58  Pulse: 80 86 92 77  Resp: 18 18 18 18   Temp: 98.6 F (37 C)  98.9 F (37.2 C) 98.6 F (37 C)  TempSrc: Oral  Oral Oral  SpO2: 99% 100% 100% 100%  Weight:      Height:        Intake/Output Summary (Last 24  hours) at 05/17/2019 1303 Last data filed at 05/17/2019 0900 Gross per 24 hour  Intake 890 ml  Output -  Net 890 ml   Filed Weights   04/27/19 2257 04/27/19 2318 05/03/19 1320  Weight: 75 kg 77.1 kg 77 kg   Physical Exam:  General: A/O x4, no acute respiratory distress Eyes: negative scleral hemorrhage, negative anisocoria, negative icterus ENT: Negative Runny nose, negative gingival bleeding, Neck:  Negative scars, masses, torticollis, lymphadenopathy, JVD Lungs: Clear to auscultation bilaterally without wheezes or crackles Cardiovascular: Regular rate and rhythm without murmur gallop or rub normal S1 and S2 Abdomen: negative abdominal pain, nondistended, positive soft, bowel sounds, no rebound, no ascites, no appreciable mass Extremities: RIGHT forearm and elbow erythema resolving, negative warmth, multiple sutures in place negative sign of infection  Skin: LEFT hand irritation at PIV site (receiving vancomycin)  Psychiatric:  Negative depression, negative anxiety, negative fatigue, negative mania  Central nervous system:  Cranial nerves II through XII intact, tongue/uvula midline, all extremities muscle strength 5/5, sensation intact throughout, negative dysarthria, negative expressive aphasia, negative receptive aphasia.     Data Reviewed: Care during the described time interval was provided by me .  I have reviewed this patient's available data, including medical history, events of note, physical examination, and all test results as part of my evaluation.   CBC: Recent Labs  Lab 05/13/19 0807 05/17/19 1143  WBC 7.7 6.0  HGB 10.7* 11.7*  HCT 34.3* 35.3*  MCV 83.9 83.3  PLT 254 255   Basic Metabolic Panel: Recent Labs  Lab 05/11/19 0756 05/13/19 0807 05/17/19 1143  NA 135 137 139  K 4.1 4.0 4.1  CL 102 104 104  CO2 24 24 31   GLUCOSE 111* 208* 122*  BUN 14 9 8   CREATININE 0.96 0.96 0.94  CALCIUM 8.8* 8.6* 9.1  MG  --   --  2.0   GFR: Estimated Creatinine Clearance: 120.6 mL/min (by C-G formula based on SCr of 0.94 mg/dL). Liver Function Tests: Recent Labs  Lab 05/11/19 0756 05/13/19 0807  AST 245* 374*  ALT 428* 539*  ALKPHOS 197* 188*  BILITOT 0.9 0.8  PROT 7.5 6.4*  ALBUMIN 3.3* 2.9*   No results for input(s): LIPASE, AMYLASE in the last 168 hours. No results for input(s): AMMONIA in the last 168 hours. Coagulation Profile: No results for input(s): INR, PROTIME in the last 168 hours. Cardiac Enzymes: No results for input(s): CKTOTAL, CKMB, CKMBINDEX, TROPONINI in the last 168 hours. BNP (last 3 results) No results for input(s): PROBNP in the last 8760 hours. HbA1C: No results for input(s): HGBA1C in the last 72 hours. CBG: No results for input(s): GLUCAP in the last 168 hours. Lipid Profile: No results for input(s): CHOL, HDL, LDLCALC, TRIG, CHOLHDL, LDLDIRECT in the last 72 hours. Thyroid Function Tests: No results for input(s): TSH, T4TOTAL, FREET4, T3FREE,  THYROIDAB in the last 72 hours. Anemia Panel: No results for input(s): VITAMINB12, FOLATE, FERRITIN, TIBC, IRON, RETICCTPCT in the last 72 hours. Urine analysis:    Component Value Date/Time   COLORURINE YELLOW 02/09/2011 2210   APPEARANCEUR CLOUDY (A) 02/09/2011 2210   LABSPEC 1.025 02/09/2011 2210   PHURINE 7.0 02/09/2011 2210   GLUCOSEU NEGATIVE 02/09/2011 2210   HGBUR NEGATIVE 02/09/2011 2210   BILIRUBINUR NEGATIVE 02/09/2011 2210   KETONESUR NEGATIVE 02/09/2011 2210   PROTEINUR NEGATIVE 02/09/2011 2210   UROBILINOGEN 1.0 02/09/2011 2210   NITRITE NEGATIVE 02/09/2011 2210   LEUKOCYTESUR NEGATIVE 02/09/2011 2210   Sepsis Labs: @LABRCNTIP (procalcitonin:4,lacticidven:4)  )  No results found for this or any previous visit (from the past 240 hour(s)).       Radiology Studies: No results found.      Scheduled Meds: . docusate sodium  100 mg Oral BID  . enoxaparin (LOVENOX) injection  40 mg Subcutaneous Q24H  . gabapentin  300 mg Oral TID   Continuous Infusions: . methocarbamol (ROBAXIN) IV    . vancomycin 750 mg (05/17/19 0829)     LOS: 19 days   The patient is critically ill with multiple organ systems failure and requires high complexity decision making for assessment and support, frequent evaluation and titration of therapies, application of advanced monitoring technologies and extensive interpretation of multiple databases. Critical Care Time devoted to patient care services described in this note  Time spent: 40 minutes     Jon Castro, Jon MessierURTIS J, MD [facility] Pager [phone]  If 7PM-7AM, please contact night-coverage [email or web address] Password [email or web address] 05/17/2019, 1:03 PM

## 2019-05-18 DIAGNOSIS — F322 Major depressive disorder, single episode, severe without psychotic features: Secondary | ICD-10-CM

## 2019-05-18 DIAGNOSIS — F1124 Opioid dependence with opioid-induced mood disorder: Secondary | ICD-10-CM

## 2019-05-18 LAB — CBC WITH DIFFERENTIAL/PLATELET
Abs Immature Granulocytes: 0.03 10*3/uL (ref 0.00–0.07)
Basophils Absolute: 0.1 10*3/uL (ref 0.0–0.1)
Basophils Relative: 1 %
Eosinophils Absolute: 0.3 10*3/uL (ref 0.0–0.5)
Eosinophils Relative: 5 %
HCT: 34.9 % — ABNORMAL LOW (ref 39.0–52.0)
Hemoglobin: 11.6 g/dL — ABNORMAL LOW (ref 13.0–17.0)
Immature Granulocytes: 0 %
Lymphocytes Relative: 31 %
Lymphs Abs: 2.2 10*3/uL (ref 0.7–4.0)
MCH: 27.6 pg (ref 26.0–34.0)
MCHC: 33.2 g/dL (ref 30.0–36.0)
MCV: 82.9 fL (ref 80.0–100.0)
Monocytes Absolute: 1 10*3/uL (ref 0.1–1.0)
Monocytes Relative: 13 %
Neutro Abs: 3.7 10*3/uL (ref 1.7–7.7)
Neutrophils Relative %: 50 %
Platelets: 220 10*3/uL (ref 150–400)
RBC: 4.21 MIL/uL — ABNORMAL LOW (ref 4.22–5.81)
RDW: 15 % (ref 11.5–15.5)
WBC: 7.3 10*3/uL (ref 4.0–10.5)
nRBC: 0 % (ref 0.0–0.2)

## 2019-05-18 LAB — MAGNESIUM: Magnesium: 2 mg/dL (ref 1.7–2.4)

## 2019-05-18 MED ORDER — ALUM & MAG HYDROXIDE-SIMETH 200-200-20 MG/5ML PO SUSP
30.0000 mL | Freq: Four times a day (QID) | ORAL | Status: DC | PRN
  Administered 2019-05-18: 30 mL via ORAL
  Filled 2019-05-18: qty 30

## 2019-05-18 NOTE — Progress Notes (Signed)
Lab will return this morning around 5 am to do blood work as per patient's request.

## 2019-05-18 NOTE — Progress Notes (Signed)
PROGRESS NOTE    Jon Castro  ZOX:[id number]:[id number] DOB: 1986/07/13 DOA: 04/27/2019 PCP: Patient, [name] Per   Brief Narrative:  33 year old WM PMHx  IV drug use, chronic pain syndrome, depression, with recent suicide attempt on Suboxone at admission apparently unable to get the clinic and get his Suboxone so he used heroin today.  Since he was released from behavioral health he has noticed increase in size of a lesion on his right forearm with fever.  Admitted to the hospital on 04/27/2019 due to a right forearm lesion/abscess after using IV drugs.  Orthopedic surgery was consulted and patient underwent incision/drainage in the OR.  His blood cultures showed MRSA as well as Candida glabrata.  ID following   Subjective: 9/19 x4, negative CP, negative S OB, negative abdominal pain.  Sitting in chair comfortably eating breakfast.  States ambulated around the ward without pain.   Assessment & Plan:   Principal Problem:   Blood culture positive for Candida species Active Problems:   MDD (major depressive disorder), severe (HCC)   Opioid dependence with opioid-induced mood disorder (HCC)   Abscess of right forearm   Hyperglycemia   Leucocytosis   LFTs abnormal   MRSA bacteremia   IVDU (intravenous drug user)   Fungemia   Right forearm abscess, MRSA bacteremia and Candida glabrata positive blood cultures, POA -Fever overnight on 05/10/2019 x1 -likely in the setting of vancomycin infiltration and pain -s/p incision and drainage in the OR 04/28/2019, appreciate orthopedic surgery follow-up -ID following, currently on anidulafungin as well as vancomycin -TTE/TEE - without evidence of vegetations -ID recommends IV anidulafungin with tentative DC date on 05/16/2019, and vancomycin/linezolid for his MRSA bacteremia 05/19/2019 - plan for DC home that day. -IV ACCESS ISSUES 05/10/19-05/11/19 -resolved - continue IV vancomycin and anidulafungin as scheduled -Schedule follow-up with Dr. Margarita Ranaimothy Murphy  orthopedic surgery in 1 to 2 weeks RIGHT forearm abscess  Left upper arm blanching erythema, infiltrate from IV, resolving -Likely in the setting of IV infiltration previously, patient has a palpable vessel in the left upper extremity concerning for phlebitis -treat symptomatically, NSAIDs, heat, ice -Erythema again likely reactive -less likely to be infectious process -resolving  Depression with anxiety/history of suicidal ideation -He just had a stay at behavioral health prior to this admission. Currently stable  Elevated LFTs likely in the setting of Hepatitis C infection, ongoing -Hepatitis panel remarkable for HCV antibody positive  -Hepatitis genotype 3, HCV quantitative 4,860,000; quantitative log 6.87 -Initially thought to be reactive to injected illicit drugs vs antifungal medication; however given HCV Ab this is likely to be the culprit given improving labs with antifungals ongoing and cessation of illicit substances -Right upper quadrant ultrasound was unremarkable for liver lesions.  -HIV negative -Liver panel continues to worsen over the past few draws, patient will complete antifungal therapy on the 16th; will follow closely after completion of antifungals - again likely in the setting of acute/subacute hepatitis - Liver enzymes 9/20 pending   DVT prophylaxis: Lovenox Code Status: Full Family Communication: None Disposition Plan: Discharge 9/20   Consultants:  Cardiology ID Orthopedic surgery    Procedures/Significant Events:  8/31 echocardiogram:The left ventricle has hyperdynamic systolic function, with an ejection fraction of >65%. The cavity size was normal. Left ventricular diastolic parameters were normal. 2. The aortic valve is tricuspid. No stenosis of the aortic valve. 3. The aortic root is normal in size and structure. 4. The right ventricle has normal systolic function. The cavity was normal. There is no increase in  right ventricular wall  thickness. 5. No evidence of mitral valve stenosis. No mitral regurgitation noted. 6. Normal IVC size. No complete TR doppler jet so unable to estimate PA systolic pressure. 7. No valvular vegetation noted  9/4 TEE:The left ventricle has normal systolic function, with an ejection fraction of 60-65%. The cavity size was normal. No evidence of left ventricular regional wall motion abnormalities. 2. The right ventricle has normal systolc function. The cavity was normal. There is no increase in right ventricular wall thickness. 3. No evidence of a thrombus present in the left atrial appendage. 4. No mitral valve vegetation visualized. 5. The aortic valve is tricuspid. 6. The aortic root is normal in size and structure. 7. No vegetations, no endocardit   I have personally reviewed and interpreted all radiology studies and my findings are as above.  VENTILATOR SETTINGS:    Cultures 8/30 blood ABSCESS RIGHT forearm positive MRSA 8/31 blood LEFT hand negative final 8/31 blood LEFT antecubital negative final    Antimicrobials: Anti-infectives (From admission, onward)   Start     Stop   05/16/19 2000  vancomycin (VANCOCIN) IVPB 750 mg/150 ml premix     05/19/19 1959   05/14/19 0500  vancomycin (VANCOCIN) 1,250 mg in sodium chloride 0.9 % 250 mL IVPB  Status:  Discontinued     05/16/19 1052   05/13/19 1600  vancomycin (VANCOCIN) 1,500 mg in sodium chloride 0.9 % 500 mL IVPB     05/13/19 1928   05/13/19 1300  linezolid (ZYVOX) tablet 600 mg     05/13/19 1345   05/12/19 1000  linezolid (ZYVOX) tablet 600 mg     05/12/19 2111   05/12/19 0859  vancomycin variable dose per unstable renal function (pharmacist dosing)  Status:  Discontinued     05/13/19 1524   05/11/19 1200  linezolid (ZYVOX) tablet 600 mg     05/11/19 2132   05/10/19 2330  linezolid (ZYVOX) tablet 600 mg     05/11/19 0053   05/08/19 1000  vancomycin (VANCOCIN) 1,250 mg in sodium chloride 0.9 % 250 mL IVPB  Status:   Discontinued     05/12/19 0921   05/02/19 1000  anidulafungin (ERAXIS) 100 mg in sodium chloride 0.9 % 100 mL IVPB     05/15/19 1148   05/01/19 2200  vancomycin (VANCOCIN) 1,500 mg in sodium chloride 0.9 % 500 mL IVPB  Status:  Discontinued     05/07/19 2338   05/01/19 0615  anidulafungin (ERAXIS) 200 mg in sodium chloride 0.9 % 200 mL IVPB     05/01/19 1112   04/28/19 1929  ceFAZolin (ANCEF) 2-4 GM/100ML-% IVPB    Note to Pharmacy: Ivar Drape   : cabinet override   04/29/19 0744   04/28/19 0600  vancomycin (VANCOCIN) IVPB 1000 mg/200 mL premix  Status:  Discontinued     04/30/19 1617   04/27/19 2315  vancomycin (VANCOCIN) IVPB 1000 mg/200 mL premix     04/28/19 0154   04/27/19 2315  piperacillin-tazobactam (ZOSYN) IVPB 3.375 g     04/28/19 0017       Devices    LINES / TUBES:      Continuous Infusions:  methocarbamol (ROBAXIN) IV     vancomycin 750 mg (05/18/19 0748)     Objective: Vitals:   05/17/19 0508 05/17/19 1635 05/17/19 2111 05/18/19 0617  BP: (!) 107/58 132/86 (!) 137/92 105/68  Pulse: 77 89 (!) 107 79  Resp: 18 18 20 18   Temp: 98.6 F (37  C) 98.8 F (37.1 C)  98.2 F (36.8 C)  TempSrc: Oral Oral Oral Oral  SpO2: 100% 99% 99% 99%  Weight:      Height:        Intake/Output Summary (Last 24 hours) at 05/18/2019 5456 Last data filed at 05/17/2019 2114 Gross per 24 hour  Intake 240 ml  Output --  Net 240 ml   Filed Weights   04/27/19 2257 04/27/19 2318 05/03/19 1320  Weight: 75 kg 77.1 kg 77 kg    Physical Exam:  General: A/O x4, no acute respiratory distress Eyes: negative scleral hemorrhage, negative anisocoria, negative icterus ENT: Negative Runny nose, negative gingival bleeding, Neck:  Negative scars, masses, torticollis, lymphadenopathy, JVD Lungs: Clear to auscultation bilaterally without wheezes or crackles Cardiovascular: Regular rate and rhythm without murmur gallop or rub normal S1 and S2 Abdomen: negative abdominal pain,  nondistended, positive soft, bowel sounds, no rebound, no ascites, no appreciable mass Extremities: RIGHT forearm and elbow erythema resolved.  Multiple sutures in place negative sign of infection.   Skin: LEFT hand irritation at PIV site Psychiatric:  Negative depression, negative anxiety, negative fatigue, negative mania  Central nervous system:  Cranial nerves II through XII intact, tongue/uvula midline, all extremities muscle strength 5/5, sensation intact throughout,  negative dysarthria, negative expressive aphasia, negative receptive aphasia.    Data Reviewed: Care during the described time interval was provided by me .  I have reviewed this patient's available data, including medical history, events of note, physical examination, and all test results as part of my evaluation.   CBC: Recent Labs  Lab 05/13/19 0807 05/17/19 1143  WBC 7.7 6.0  HGB 10.7* 11.7*  HCT 34.3* 35.3*  MCV 83.9 83.3  PLT 254 255   Basic Metabolic Panel: Recent Labs  Lab 05/13/19 0807 05/17/19 1143  NA 137 139  K 4.0 4.1  CL 104 104  CO2 24 31  GLUCOSE 208* 122*  BUN 9 8  CREATININE 0.96 0.94  CALCIUM 8.6* 9.1  MG  --  2.0   GFR: Estimated Creatinine Clearance: 120.6 mL/min (by C-G formula based on SCr of 0.94 mg/dL). Liver Function Tests: Recent Labs  Lab 05/13/19 0807  AST 374*  ALT 539*  ALKPHOS 188*  BILITOT 0.8  PROT 6.4*  ALBUMIN 2.9*   No results for input(s): LIPASE, AMYLASE in the last 168 hours. No results for input(s): AMMONIA in the last 168 hours. Coagulation Profile: No results for input(s): INR, PROTIME in the last 168 hours. Cardiac Enzymes: No results for input(s): CKTOTAL, CKMB, CKMBINDEX, TROPONINI in the last 168 hours. BNP (last 3 results) No results for input(s): PROBNP in the last 8760 hours. HbA1C: No results for input(s): HGBA1C in the last 72 hours. CBG: No results for input(s): GLUCAP in the last 168 hours. Lipid Profile: No results for input(s):  CHOL, HDL, LDLCALC, TRIG, CHOLHDL, LDLDIRECT in the last 72 hours. Thyroid Function Tests: No results for input(s): TSH, T4TOTAL, FREET4, T3FREE, THYROIDAB in the last 72 hours. Anemia Panel: No results for input(s): VITAMINB12, FOLATE, FERRITIN, TIBC, IRON, RETICCTPCT in the last 72 hours. Urine analysis:    Component Value Date/Time   COLORURINE YELLOW 02/09/2011 2210   APPEARANCEUR CLOUDY (A) 02/09/2011 2210   LABSPEC 1.025 02/09/2011 2210   PHURINE 7.0 02/09/2011 2210   GLUCOSEU NEGATIVE 02/09/2011 2210   HGBUR NEGATIVE 02/09/2011 2210   BILIRUBINUR NEGATIVE 02/09/2011 2210   KETONESUR NEGATIVE 02/09/2011 2210   PROTEINUR NEGATIVE 02/09/2011 2210   UROBILINOGEN 1.0  02/09/2011 2210   NITRITE NEGATIVE 02/09/2011 2210   LEUKOCYTESUR NEGATIVE 02/09/2011 2210   Sepsis Labs: @LABRCNTIP (procalcitonin:4,lacticidven:4)  )No results found for this or any previous visit (from the past 240 hour(s)).       Radiology Studies: No results found.      Scheduled Meds:  docusate sodium  100 mg Oral BID   enoxaparin (LOVENOX) injection  40 mg Subcutaneous Q24H   gabapentin  300 mg Oral TID   Continuous Infusions:  methocarbamol (ROBAXIN) IV     vancomycin 750 mg (05/18/19 0748)     LOS: 20 days   The patient is critically ill with multiple organ systems failure and requires high complexity decision making for assessment and support, frequent evaluation and titration of therapies, application of advanced monitoring technologies and extensive interpretation of multiple databases. Critical Care Time devoted to patient care services described in this note  Time spent: 40 minutes     Kimbley Sprague, Roselind MessierURTIS J, MD [facility] Pager [phone]  If 7PM-7AM, please contact night-coverage [email or web address] Password [email or web address] 05/18/2019, 9:06 AM

## 2019-05-19 DIAGNOSIS — Z8659 Personal history of other mental and behavioral disorders: Secondary | ICD-10-CM

## 2019-05-19 LAB — CBC WITH DIFFERENTIAL/PLATELET
Abs Immature Granulocytes: 0.04 10*3/uL (ref 0.00–0.07)
Basophils Absolute: 0.1 10*3/uL (ref 0.0–0.1)
Basophils Relative: 1 %
Eosinophils Absolute: 0.3 10*3/uL (ref 0.0–0.5)
Eosinophils Relative: 5 %
HCT: 35.9 % — ABNORMAL LOW (ref 39.0–52.0)
Hemoglobin: 12 g/dL — ABNORMAL LOW (ref 13.0–17.0)
Immature Granulocytes: 1 %
Lymphocytes Relative: 34 %
Lymphs Abs: 2 10*3/uL (ref 0.7–4.0)
MCH: 27.6 pg (ref 26.0–34.0)
MCHC: 33.4 g/dL (ref 30.0–36.0)
MCV: 82.5 fL (ref 80.0–100.0)
Monocytes Absolute: 0.8 10*3/uL (ref 0.1–1.0)
Monocytes Relative: 14 %
Neutro Abs: 2.8 10*3/uL (ref 1.7–7.7)
Neutrophils Relative %: 45 %
Platelets: 245 10*3/uL (ref 150–400)
RBC: 4.35 MIL/uL (ref 4.22–5.81)
RDW: 15 % (ref 11.5–15.5)
WBC: 6.1 10*3/uL (ref 4.0–10.5)
nRBC: 0 % (ref 0.0–0.2)

## 2019-05-19 LAB — COMPREHENSIVE METABOLIC PANEL
ALT: 517 U/L — ABNORMAL HIGH (ref 0–44)
AST: 360 U/L — ABNORMAL HIGH (ref 15–41)
Albumin: 3.1 g/dL — ABNORMAL LOW (ref 3.5–5.0)
Alkaline Phosphatase: 226 U/L — ABNORMAL HIGH (ref 38–126)
Anion gap: 11 (ref 5–15)
BUN: 10 mg/dL (ref 6–20)
CO2: 24 mmol/L (ref 22–32)
Calcium: 9 mg/dL (ref 8.9–10.3)
Chloride: 102 mmol/L (ref 98–111)
Creatinine, Ser: 0.83 mg/dL (ref 0.61–1.24)
GFR calc Af Amer: >60 mL/min (ref >60)
GFR calc non Af Amer: >60 mL/min (ref >60)
Glucose, Bld: 169 mg/dL — ABNORMAL HIGH (ref 70–99)
Potassium: 4.2 mmol/L (ref 3.5–5.1)
Sodium: 137 mmol/L (ref 135–145)
Total Bilirubin: 0.7 mg/dL (ref 0.3–1.2)
Total Protein: 6.7 g/dL (ref 6.5–8.1)

## 2019-05-19 LAB — MAGNESIUM: Magnesium: 1.9 mg/dL (ref 1.7–2.4)

## 2019-05-19 MED ORDER — GABAPENTIN 300 MG PO CAPS: 300.0000 mg | ORAL_CAPSULE | Freq: Three times a day (TID) | ORAL | 0 refills | Status: DC

## 2019-05-19 MED ORDER — OXYCODONE HCL 5 MG PO TABS: 5.0000 mg | ORAL_TABLET | ORAL | 0 refills | Status: DC | PRN

## 2019-05-19 MED ORDER — IBUPROFEN 800 MG PO TABS: 800.0000 mg | ORAL_TABLET | Freq: Three times a day (TID) | ORAL | 0 refills | Status: DC | PRN

## 2019-05-19 NOTE — Discharge Summary (Addendum)
Physician Discharge Summary  Jon Castro CVE:[id number] DOB: 1985/12/29 DOA: 04/27/2019  PCP: Patient, [name] Per  Admit date: 04/27/2019 Discharge date: 05/19/2019  Time spent: 30 minutes  Recommendations for Outpatient Follow-up:    Right forearm abscess, MRSA bacteremia and Candida glabrata positive blood cultures,  -Fever overnight on 05/10/2019 x1 -likely in the setting of vancomycin infiltration and pain -s/p incision and drainage in the OR 04/28/2019, appreciate orthopedic surgery follow-up -ID following, currently on anidulafungin as well as vancomycin -TTE/TEE - without evidence of vegetations -ID recommends IV anidulafungin with tentative DC date on 05/16/2019, and vancomycin/linezolid for his MRSA bacteremia 05/19/2019 - plan for DC home that day. -IV ACCESS ISSUES 05/10/19-05/11/19 -resolved - continue IV vancomycin and anidulafungin as scheduled -Schedule follow-up with Dr. Edmonia Lynch orthopedic surgery in 1 to 2 weeks RIGHT forearm abscess  Left upper arm blanching erythema, infiltrate from IV, resolving -Likely in the setting of IV infiltration previously, patient has a palpable vessel in the left upper extremity concerning for phlebitis -treat symptomatically, NSAIDs, heat, ice -Erythema again likely reactive -less likely to be infectious process -resolving  Depression with anxiety/history of suicidal ideation -He just had a stay at behavioral health prior to this admission. Currently stable - Schedule establish care appointment with [facility] and [facility] in 1 to 2 weeks MRSA bacteremia, depression and anxiety.  Elevated LFTs likely in the setting of Hepatitis C infection, ongoing -Hepatitis panel remarkable for HCV antibody positive  -Hepatitis genotype 3, HCV quantitative 4,860,000; quantitative log 6.87 -Initially thought to be reactive to injected illicit drugs vs antifungal medication; however given HCV Ab this is likely to be the  culprit given improving labs with antifungals ongoing and cessation of illicit substances -Right upper quadrant ultrasound was unremarkable for liver lesions.  -HIV negative -Liver panel continues to worsen over the past fewdraws, patient will complete antifungal therapy on the 16th; will follow closely after completion of antifungals- again likely in the setting of acute/subacute hepatitis - Liver enzymes 9/20 pending  - Schedule establish care appointment in 2 weeks with Dr. Herbie Baltimore Comar infectious disease to discuss hepatitis C infection treat vs no treatment.   Discharge Diagnoses:  Principal Problem:   Blood culture positive for Candida species Active Problems:   MDD (major depressive disorder), severe (HCC)   Opioid dependence with opioid-induced mood disorder (HCC)   Abscess of right forearm   Hyperglycemia   Leucocytosis   LFTs abnormal   MRSA bacteremia   IVDU (intravenous drug user)   Fungemia   Discharge Condition: Stable  Diet recommendation: Regular  Filed Weights   04/27/19 2257 04/27/19 2318 05/03/19 1320  Weight: 75 kg 77.1 kg 77 kg    History of present illness:  33 year old WM PMHx  IV drug use, chronic pain syndrome, depression, with recent suicide attempt on Suboxone at admission apparently unable to get the clinic and get his Suboxone so he used heroin today. Since he was released from behavioral health he has noticed increase in size of a lesion on his right forearm with fever.  Admitted to the hospital on 04/27/2019 due to a right forearm lesion/abscess after using IV drugs. Orthopedic surgery was consulted and patient underwent incision/drainage in the OR. His blood cultures showed MRSA as well as Candida glabrata. ID following  Hospital Course:  His hospitalization patient was treated for MRSA bacteremia/Candida glabrata bacteremia and cellulitis which required I&D and parenteral antibiotics and antifungal.  Patient has responded well.  Patient's  admission complicated  by a positive diagnosis of acute hepatitis C infection.  Currently patient asymptomatic stable for discharge.     Procedures: 8/31 echocardiogram:The left ventricle has hyperdynamic systolic function, with an ejection fraction of >65%. The cavity size was normal. Left ventricular diastolic parameters were normal. 2. The aortic valve is tricuspid. No stenosis of the aortic valve. 3. The aortic root is normal in size and structure. 4. The right ventricle has normal systolic function. The cavity was normal. There is no increase in right ventricular wall thickness. 5. No evidence of mitral valve stenosis. No mitral regurgitation noted. 6. Normal IVC size. No complete TR doppler jet so unable to estimate PA systolic pressure. 7. No valvular vegetation noted  9/4 TEE:The left ventricle has normal systolic function, with an ejection fraction of 60-65%. The cavity size was normal. No evidence of left ventricular regional wall motion abnormalities. 2. The right ventricle has normal systolc function. The cavity was normal. There is no increase in right ventricular wall thickness. 3. No evidence of a thrombus present in the left atrial appendage. 4. No mitral valve vegetation visualized. 5. The aortic valve is tricuspid. 6. The aortic root is normal in size and structure. 7. No vegetations, no endocardit   Consultations: Cardiology ID Orthopedic surgery   Cultures   8/29 blood RIGHT hand positive Candida glabrata 8/29 RIGHT arm positive MRSA 8/30 SARS coronavirus negative 8/30 blood ABSCESS RIGHT forearm positive MRSA 8/31 blood LEFT hand negative final 8/31 blood LEFT antecubital negative final 9/7 HIV negative 9/9 positive hepatitis C; quantitative 4,860,000    Antibiotics Anti-infectives (From admission, onward)   Start     Stop   05/16/19 2000  vancomycin (VANCOCIN) IVPB 750 mg/150 ml premix     05/19/19 1959   05/14/19 0500  vancomycin (VANCOCIN)  1,250 mg in sodium chloride 0.9 % 250 mL IVPB  Status:  Discontinued     05/16/19 1052   05/13/19 1600  vancomycin (VANCOCIN) 1,500 mg in sodium chloride 0.9 % 500 mL IVPB     05/13/19 1928   05/13/19 1300  linezolid (ZYVOX) tablet 600 mg     05/13/19 1345   05/12/19 1000  linezolid (ZYVOX) tablet 600 mg     05/12/19 2111   05/12/19 0859  vancomycin variable dose per unstable renal function (pharmacist dosing)  Status:  Discontinued     05/13/19 1524   05/11/19 1200  linezolid (ZYVOX) tablet 600 mg     05/11/19 2132   05/10/19 2330  linezolid (ZYVOX) tablet 600 mg     05/11/19 0053   05/08/19 1000  vancomycin (VANCOCIN) 1,250 mg in sodium chloride 0.9 % 250 mL IVPB  Status:  Discontinued     05/12/19 0921   05/02/19 1000  anidulafungin (ERAXIS) 100 mg in sodium chloride 0.9 % 100 mL IVPB     05/15/19 1148   05/01/19 2200  vancomycin (VANCOCIN) 1,500 mg in sodium chloride 0.9 % 500 mL IVPB  Status:  Discontinued     05/07/19 2338   05/01/19 0615  anidulafungin (ERAXIS) 200 mg in sodium chloride 0.9 % 200 mL IVPB     05/01/19 1112   04/28/19 1929  ceFAZolin (ANCEF) 2-4 GM/100ML-% IVPB    Note to Pharmacy: [name]   : cabinet override   04/29/19 0744   04/28/19 0600  vancomycin (VANCOCIN) IVPB 1000 mg/200 mL premix  Status:  Discontinued     04/30/19 1617   04/27/19 2315  vancomycin (VANCOCIN) IVPB 1000 mg/200 mL premix  04/28/19 0154   04/27/19 2315  piperacillin-tazobactam (ZOSYN) IVPB 3.375 g     04/28/19 0017       Discharge Exam: Vitals:   05/18/19 0617 05/18/19 1408 05/18/19 2148 05/19/19 0621  BP: 105/68 117/68 121/70 108/64  Pulse: 79 100 85 67  Resp: 18 18 18 16   Temp: 98.2 F (36.8 C) 99.2 F (37.3 C) 97.9 F (36.6 C) 97.9 F (36.6 C)  TempSrc: Oral Oral Oral Oral  SpO2: 99% 100% 100% 99%  Weight:      Height:        General: A/O x4, no acute respiratory distress Eyes: negative scleral hemorrhage, negative anisocoria, negative icterus ENT:  Negative Runny nose, negative gingival bleeding, Neck:  Negative scars, masses, torticollis, lymphadenopathy, JVD Lungs: Clear to auscultation bilaterally without wheezes or crackles Cardiovascular: Regular rate and rhythm without murmur gallop or rub normal S1 and S2 Abdomen: negative abdominal pain, nondistended, positive soft, bowel sounds, no rebound, no ascites, no appreciable mass Extremities: RIGHT forearm and elbow erythema resolved.  Multiple sutures in place negative sign of infection.      Discharge Instructions   Allergies as of 05/19/2019      Reactions   Vicodin [hydrocodone-acetaminophen] Itching   Codeine Nausea And Vomiting   Tramadol Other (See Comments)   "messes with head"      Medication List    TAKE these medications   acetaminophen 325 MG tablet Commonly known as: TYLENOL Take 1-2 tablets (325-650 mg total) by mouth every 6 (six) hours as needed for mild pain (pain score 1-3 or temp > 100.5).   gabapentin 300 MG capsule Commonly known as: NEURONTIN Take 1 capsule (300 mg total) by mouth 3 (three) times daily.   ibuprofen 800 MG tablet Commonly known as: ADVIL Take 1 tablet (800 mg total) by mouth every 8 (eight) hours as needed for moderate pain.   methocarbamol 500 MG tablet Commonly known as: ROBAXIN Take 1 tablet (500 mg total) by mouth every 6 (six) hours as needed for muscle spasms.   ondansetron 4 MG tablet Commonly known as: ZOFRAN Take 1 tablet (4 mg total) by mouth every 6 (six) hours as needed for nausea.   oxyCODONE 5 MG immediate release tablet Commonly known as: Oxy IR/ROXICODONE Take 1-2 tablets (5-10 mg total) by mouth every 4 (four) hours as needed for severe pain (pain score 4-6).      Allergies  Allergen Reactions  . Vicodin [Hydrocodone-Acetaminophen] Itching  . Codeine Nausea And Vomiting  . Tramadol Other (See Comments)    "messes with head"   Follow-up Information    Sheral ApleyMurphy, Timothy D, MD. Schedule an appointment as  soon as possible for a visit in 1 week.   Specialty: Orthopedic Surgery Contact information: [address] [place] [id number] [phone]        [facility]. Schedule an appointment as soon as possible for a visit.   Contact information: [address] [place]  [phone]       Gardiner Barefootomer, Robert W, MD. Schedule an appointment as soon as possible for a visit in 2 week(s).   Specialty: Infectious Diseases Why: Schedule establish care appointment in 2 weeks with Dr. Molly Maduroobert Comar infectious disease to discuss hepatitis C infection treat vs no treatment. Contact information: [address] [place] [address] [phone]            The results of significant diagnostics from this hospitalization (  including imaging, microbiology, ancillary and laboratory) are listed below for reference.    Significant Diagnostic Studies: Ct Forearm Right W Contrast  Result Date: 04/28/2019 CLINICAL DATA:  Soft tissue infection EXAM: CT OF THE UPPER RIGHT EXTREMITY WITH CONTRAST TECHNIQUE: Multidetector CT imaging of the upper right extremity was performed according to the standard protocol following intravenous contrast administration. COMPARISON:  None. CONTRAST:  OMNIPAQUE IOHEXOL 300 MG/ML  SOLN FINDINGS: Bones/Joint/Cartilage No fracture or malalignment. No periostitis or bone destruction. No significant elbow effusion. Ligaments Suboptimally assessed by CT. Muscles and Tendons Low-attenuation within the muscles of the proximal forearm on the volar and ulnar side. Triceps tendon grossly intact. Soft tissues Marked subcutaneous edema and fluid throughout the forearm. Rim enhancing fluid collection within the subcutaneous fat measuring 2.3 by 3.7 x 1.7 cm within the proximal forearm, volar side about 9 cm distal to the elbow joint. No soft tissue emphysema or radiopaque foreign body. IMPRESSION:  1. No acute osseous abnormality. 2. Extensive subcutaneous fluid and edema throughout the forearm consistent with cellulitis. 2.3 x 3.7 x 1.7 cm rim enhancing fluid collection within the superficial soft tissues of the proximal forearm on the volar side, about 9 cm distal to the elbow joint consistent with a soft tissue abscess. 3. Low-attenuation within the proximal forearm muscles on the volar and ulnar side the forearm compatible with myositis and possible intramuscular abscess though no strong rim enhancement. MRI would better delineate the extent of soft tissue/muscular involvement. Electronically Signed   By: Jasmine Pang M.D.   On: 04/28/2019 04:02   [place] Ekg Site Rite  Result Date: 05/10/2019 If Site Rite image not attached, placement could not be confirmed due to current cardiac rhythm.  US Abdomen Limited Ruq  Result Date: 05/06/2019 CLINICAL DATA:  History of IV drug abuse.  Question ascites. EXAM: ULTRASOUND ABDOMEN LIMITED RIGHT UPPER QUADRANT COMPARISON:  None. FINDINGS: Gallbladder: No gallstones or wall thickening visualized. No sonographic Murphy sign noted by sonographer. Common bile duct: Diameter: 0.3 cm Liver: No focal lesion identified. Within normal limits in parenchymal echogenicity. Portal vein is patent on color Doppler imaging with normal direction of blood flow towards the liver. Other: None. IMPRESSION: Negative for ascites.  Normal exam. Electronically Signed   By: Drusilla Kanner M.D.   On: 05/06/2019 09:52    Microbiology: No results found for this or any previous visit (from the past 240 hour(s)).   Labs: Basic Metabolic Panel: Recent Labs  Lab 05/13/19 0807 05/17/19 1143 05/18/19 0942 05/19/19 0751  NA 137 139  --  137  K 4.0 4.1  --  4.2  CL 104 104  --  102  CO2 24 31  --  24  GLUCOSE 208* 122*  --  169*  BUN 9 8  --  10  CREATININE 0.96 0.94  --  0.83  CALCIUM 8.6* 9.1  --  9.0  MG  --  2.0 2.0 1.9   Liver Function Tests: Recent Labs  Lab  05/13/19 0807 05/19/19 0751  AST 374* 360*  ALT 539* 517*  ALKPHOS 188* 226*  BILITOT 0.8 0.7  PROT 6.4* 6.7  ALBUMIN 2.9* 3.1*   No results for input(s): LIPASE, AMYLASE in the last 168 hours. No results for input(s): AMMONIA in the last 168 hours. CBC: Recent Labs  Lab 05/13/19 0807 05/17/19 1143 05/18/19 0942 05/19/19 0751  WBC 7.7 6.0 7.3 6.1  NEUTROABS  --   --  3.7 2.8  HGB 10.7* 11.7* 11.6* 12.0*  HCT 34.3* 35.3* 34.9* 35.9*  MCV 83.9 83.3 82.9 82.5  PLT 254 255 220 245   Cardiac Enzymes: No results for input(s): CKTOTAL, CKMB, CKMBINDEX, TROPONINI in the last 168 hours. BNP: BNP (last 3 results) No results for input(s): BNP in the last 8760 hours.  ProBNP (last 3 results) No results for input(s): PROBNP in the last 8760 hours.  CBG: No results for input(s): GLUCAP in the last 168 hours.     Signed:  Carolyne Littlesurtis Woods, MD [facility] [phone] pager

## 2019-05-19 NOTE — Care Management (Addendum)
Patient's meds not filled in [place].  Patient given [facility] letter and printed scripts to take to a pharmacy on the [facility] list.  Copays will be $3/each.  Override entered for narcotic.    This is patient's second MATCH allowance in 3 months.  Patient needs to look for  alternatives for filling prescriptions.

## 2019-05-23 LAB — MISC LABCORP TEST (SEND OUT)
LabCorp test name: 1
Labcorp test code: 182220

## 2019-05-28 LAB — CULTURE, BLOOD (ROUTINE X 2)

## 2019-05-30 ENCOUNTER — Ambulatory Visit (HOSPITAL_COMMUNITY): Payer: Self-pay

## 2019-05-30 ENCOUNTER — Encounter (HOSPITAL_COMMUNITY): Payer: Self-pay | Admitting: Emergency Medicine

## 2019-05-30 ENCOUNTER — Emergency Department (HOSPITAL_COMMUNITY)
Admission: EM | Admit: 2019-05-30 | Discharge: 2019-05-31 | Disposition: A | Discharge status: Home / Self Care | Payer: Self-pay | Attending: Emergency Medicine | Admitting: Emergency Medicine

## 2019-05-30 ENCOUNTER — Other Ambulatory Visit: Payer: Self-pay

## 2019-05-30 DIAGNOSIS — Y939 Activity, unspecified: Secondary | ICD-10-CM | POA: Insufficient documentation

## 2019-05-30 DIAGNOSIS — T1491XA Suicide attempt, initial encounter: Secondary | ICD-10-CM | POA: Insufficient documentation

## 2019-05-30 DIAGNOSIS — F1910 Other psychoactive substance abuse, uncomplicated: Secondary | ICD-10-CM | POA: Insufficient documentation

## 2019-05-30 DIAGNOSIS — F329 Major depressive disorder, single episode, unspecified: Secondary | ICD-10-CM | POA: Insufficient documentation

## 2019-05-30 DIAGNOSIS — Y929 Unspecified place or not applicable: Secondary | ICD-10-CM | POA: Insufficient documentation

## 2019-05-30 DIAGNOSIS — F17210 Nicotine dependence, cigarettes, uncomplicated: Secondary | ICD-10-CM | POA: Insufficient documentation

## 2019-05-30 DIAGNOSIS — X58XXXA Exposure to other specified factors, initial encounter: Secondary | ICD-10-CM | POA: Insufficient documentation

## 2019-05-30 DIAGNOSIS — L299 Pruritus, unspecified: Secondary | ICD-10-CM | POA: Insufficient documentation

## 2019-05-30 DIAGNOSIS — T50902A Poisoning by unspecified drugs, medicaments and biological substances, intentional self-harm, initial encounter: Secondary | ICD-10-CM | POA: Insufficient documentation

## 2019-05-30 DIAGNOSIS — T401X1A Poisoning by heroin, accidental (unintentional), initial encounter: Secondary | ICD-10-CM | POA: Diagnosis present

## 2019-05-30 DIAGNOSIS — Z20828 Contact with and (suspected) exposure to other viral communicable diseases: Secondary | ICD-10-CM | POA: Insufficient documentation

## 2019-05-30 DIAGNOSIS — Y999 Unspecified external cause status: Secondary | ICD-10-CM | POA: Insufficient documentation

## 2019-05-30 HISTORY — DX: Other psychoactive substance abuse, uncomplicated: F19.10

## 2019-05-30 HISTORY — DX: Abnormal results of liver function studies: R94.5

## 2019-05-30 HISTORY — DX: Other specified abnormal findings of blood chemistry: R79.89

## 2019-05-30 HISTORY — DX: Other psychoactive substance use, unspecified, uncomplicated: F19.90

## 2019-05-30 LAB — COMPREHENSIVE METABOLIC PANEL
ALT: 321 U/L — ABNORMAL HIGH (ref 0–44)
AST: 190 U/L — ABNORMAL HIGH (ref 15–41)
Albumin: 3.7 g/dL (ref 3.5–5.0)
Alkaline Phosphatase: 164 U/L — ABNORMAL HIGH (ref 38–126)
Anion gap: 11 (ref 5–15)
BUN: 19 mg/dL (ref 6–20)
CO2: 19 mmol/L — ABNORMAL LOW (ref 22–32)
Calcium: 8.3 mg/dL — ABNORMAL LOW (ref 8.9–10.3)
Chloride: 106 mmol/L (ref 98–111)
Creatinine, Ser: 1.02 mg/dL (ref 0.61–1.24)
GFR calc Af Amer: >60 mL/min (ref >60)
GFR calc non Af Amer: >60 mL/min (ref >60)
Glucose, Bld: 178 mg/dL — ABNORMAL HIGH (ref 70–99)
Potassium: 4 mmol/L (ref 3.5–5.1)
Sodium: 136 mmol/L (ref 135–145)
Total Bilirubin: 1 mg/dL (ref 0.3–1.2)
Total Protein: 7.1 g/dL (ref 6.5–8.1)

## 2019-05-30 LAB — CBC WITH DIFFERENTIAL/PLATELET
Abs Immature Granulocytes: 0 10*3/uL (ref 0.00–0.07)
Band Neutrophils: 1 %
Basophils Absolute: 0 10*3/uL (ref 0.0–0.1)
Basophils Relative: 0 %
Eosinophils Absolute: 0.8 10*3/uL — ABNORMAL HIGH (ref 0.0–0.5)
Eosinophils Relative: 4 %
HCT: 40.1 % (ref 39.0–52.0)
Hemoglobin: 13.3 g/dL (ref 13.0–17.0)
Lymphocytes Relative: 41 %
Lymphs Abs: 8.2 10*3/uL — ABNORMAL HIGH (ref 0.7–4.0)
MCH: 27.5 pg (ref 26.0–34.0)
MCHC: 33.2 g/dL (ref 30.0–36.0)
MCV: 82.9 fL (ref 80.0–100.0)
Monocytes Absolute: 1.2 10*3/uL — ABNORMAL HIGH (ref 0.1–1.0)
Monocytes Relative: 6 %
Neutro Abs: 9.8 10*3/uL — ABNORMAL HIGH (ref 1.7–7.7)
Neutrophils Relative %: 48 %
Platelets: 448 10*3/uL — ABNORMAL HIGH (ref 150–400)
RBC: 4.84 MIL/uL (ref 4.22–5.81)
RDW: 15.3 % (ref 11.5–15.5)
WBC: 20.1 10*3/uL — ABNORMAL HIGH (ref 4.0–10.5)
nRBC: 0 % (ref 0.0–0.2)

## 2019-05-30 LAB — RAPID URINE DRUG SCREEN, HOSP PERFORMED
Amphetamines: POSITIVE — AB
Barbiturates: NOT DETECTED
Benzodiazepines: NOT DETECTED
Cocaine: NOT DETECTED
Opiates: POSITIVE — AB
Tetrahydrocannabinol: POSITIVE — AB

## 2019-05-30 LAB — SALICYLATE LEVEL: Salicylate Lvl: <7 mg/dL (ref 2.8–30.0)

## 2019-05-30 LAB — ACETAMINOPHEN LEVEL: Acetaminophen (Tylenol), Serum: <10 ug/mL — ABNORMAL LOW (ref 10–30)

## 2019-05-30 LAB — SARS CORONAVIRUS 2 BY RT PCR (HOSPITAL ORDER, PERFORMED IN ~~LOC~~ HOSPITAL LAB): SARS Coronavirus 2: NEGATIVE

## 2019-05-30 LAB — ETHANOL: Alcohol, Ethyl (B): <10 mg/dL (ref <10)

## 2019-05-30 MED ORDER — ONDANSETRON 4 MG PO TBDP: 4.0000 mg | ORAL_TABLET | Freq: Four times a day (QID) | ORAL | Status: DC | PRN

## 2019-05-30 MED ORDER — SODIUM CHLORIDE 0.9 % IV BOLUS
1000.0000 mL | Freq: Once | INTRAVENOUS | Status: AC
  Administered 2019-05-30: 11:00:00 1000 mL via INTRAVENOUS

## 2019-05-30 MED ORDER — DICYCLOMINE HCL 20 MG PO TABS: 20.0000 mg | ORAL_TABLET | Freq: Four times a day (QID) | ORAL | Status: DC | PRN

## 2019-05-30 MED ORDER — NAPROXEN 250 MG PO TABS: 500.0000 mg | ORAL_TABLET | Freq: Two times a day (BID) | ORAL | Status: DC | PRN

## 2019-05-30 MED ORDER — NICOTINE 21 MG/24HR TD PT24: 21.0000 mg | MEDICATED_PATCH | Freq: Once | TRANSDERMAL | Status: DC

## 2019-05-30 MED ORDER — DIPHENHYDRAMINE HCL 25 MG PO CAPS
25.0000 mg | ORAL_CAPSULE | Freq: Once | ORAL | Status: AC
  Administered 2019-05-30: 25 mg via ORAL
  Filled 2019-05-30: qty 1

## 2019-05-30 MED ORDER — ONDANSETRON HCL 4 MG/2ML IJ SOLN
4.0000 mg | Freq: Once | INTRAMUSCULAR | Status: AC
  Administered 2019-05-30: 11:00:00 4 mg via INTRAVENOUS

## 2019-05-30 MED ORDER — LOPERAMIDE HCL 2 MG PO CAPS: 2.0000 mg | ORAL_CAPSULE | ORAL | Status: DC | PRN

## 2019-05-30 MED ORDER — HYDROXYZINE HCL 25 MG PO TABS: 25.0000 mg | ORAL_TABLET | Freq: Four times a day (QID) | ORAL | Status: DC | PRN

## 2019-05-30 MED ORDER — METHOCARBAMOL 500 MG PO TABS: 500.0000 mg | ORAL_TABLET | Freq: Three times a day (TID) | ORAL | Status: DC | PRN

## 2019-05-30 NOTE — ED Notes (Signed)
Peer Support advised on their way to come see pt in ED from [facility].

## 2019-05-30 NOTE — ED Triage Notes (Signed)
Pt was dropped off by a friend after using IV drugs pt was unresponsive per friend. This rn along with another RN & emt got pt out of car and taken to trauma room. Pt was responsive to sternal rub and had swallow respirations. Pt placed on cardiac monitor and started IV to administer IV fluids- holding off on narcan due to pt breathing a alert to voice.

## 2019-05-30 NOTE — ED Notes (Addendum)
Pts belongings were taken out of room and placed in locker 6. Due to a used needle falling out of pants when I picked them up for safety reasons I did no go through this pts whole bag. I simply removed all belongings from patient and locked them in locker 6. Pt was then wanded by security and given purple scrubs. Needle placed in sharps box.

## 2019-05-30 NOTE — ED Notes (Signed)
Pt given lunch tray.

## 2019-05-30 NOTE — Progress Notes (Signed)
Per Anette Riedel, NP pt is recommended for inpt tx. TTS to seek placement due to no appropriate beds at [facility]. Pt's nurseTiffany, RN has been advised.   TTS attempted to inform the EDP of the disposition but did not receive an answer.   Lind Covert, MSW, LCSW Therapeutic Triage Specialist  [phone]

## 2019-05-30 NOTE — ED Provider Notes (Signed)
[facility]GENCY [facility] Provider Note   CSN: [id number] Arrival date & time: 05/30/19  1056     History   Chief Complaint Chief Complaint  Patient presents with  . Drug Overdose    HPI Jon Castro is a 33 y.o. male who presents today for evaluation of a possible overdose.  Patient was dropped off by a friend after reportedly using IV drugs and the friend reports that he was unresponsive.  Staff remove patient from car and took him to trauma room.  On my evaluation he was mumbling nonsense words and attempting to sit up.  He was able to follow simple commands.  Chart review shows that he was recently admitted for polysubstance abuse, most recent UDS was positive for cocaine, opioids, and THC.  At that time he was admitted for MRSA bacteremia and fungemia and was discharged on 9/20.  Other MRN for primary chart [id number]     HPI  Past Medical History:  Diagnosis Date  . Drug abuse (HCC)   . IVDU (intravenous drug user)   . LFTs abnormal     There are no active problems to display for this patient.   History reviewed. No pertinent surgical history.     Problem List Collapse by Default  Collapse by Default     Nervous and Auditory  Opioid dependence with opioid-induced mood disorder (HCC)   Other  MDD (major depressive disorder), severe (HCC)   Abscess of right forearm   Hyperglycemia   Leucocytosis   LFTs abnormal   MRSA bacteremia   IVDU (intravenous drug user)   Fungemia   Blood culture positive for Candida species       Home Medications    Prior to Admission medications   Not on File    Family History History reviewed. No pertinent family history.  Social History Social History   Tobacco Use  . Smoking status: Current Every Day Smoker  . Smokeless tobacco: Current User  Substance Use Topics  . Alcohol use: Yes  . Drug use: Yes    Types: IV, Cocaine, Marijuana, Amphetamines     Allergies    Codeine, Tramadol, and Vicodin [hydrocodone-acetaminophen]   Review of Systems Review of Systems  Unable to perform ROS: Mental status change     Physical Exam Updated Vital Signs BP (!) 113/48   Pulse 80   Temp 97.8 F (36.6 C) (Oral)   Resp 16   SpO2 94%   Physical Exam Vitals signs and nursing note reviewed.  Constitutional:      Appearance: He is well-developed.     Comments: Eyes intermittently closed, He will pick his head up and mumble nonsensical words.    HENT:     Head: Normocephalic and atraumatic.  Eyes:     General: No scleral icterus.       Right eye: No discharge.        Left eye: No discharge.     Conjunctiva/sclera: Conjunctivae normal.     Comments: Pupils are 2 mm bilaterally.  Neck:     Musculoskeletal: Normal range of motion and neck supple.     Comments: Moves head and neck spontaneously Cardiovascular:     Rate and Rhythm: Regular rhythm. Tachycardia present.     Pulses: Normal pulses.     Heart sounds: Normal heart sounds.  Pulmonary:     Effort: Pulmonary effort is normal. No respiratory distress.     Breath sounds: Normal breath sounds. No stridor.  Abdominal:     General: There is no distension.     Tenderness: There is no abdominal tenderness.  Musculoskeletal:        General: No deformity.  Skin:    General: Skin is warm and dry.     Comments: There are multiple wounds over veins present primarily on the right forearm.  There is a area of swelling present in the right AC fossa.    Neurological:     Motor: No abnormal muscle tone.     Comments: Unable to stand when held upright.  He moves all 4 extremities spontaneously.  He will attempt to sit up in bed and mumbles nonsensical words.  Pupils are pinpoint bilaterally.      ED Treatments / Results  Labs (all labs ordered are listed, but only abnormal results are displayed) Labs Reviewed  COMPREHENSIVE METABOLIC PANEL - Abnormal; Notable for the following components:      Result  Value   CO2 19 (*)    Glucose, Bld 178 (*)    Calcium 8.3 (*)    AST 190 (*)    ALT 321 (*)    Alkaline Phosphatase 164 (*)    All other components within normal limits  CBC WITH DIFFERENTIAL/PLATELET - Abnormal; Notable for the following components:   WBC 20.1 (*)    Platelets 448 (*)    Neutro Abs 9.8 (*)    Lymphs Abs 8.2 (*)    Monocytes Absolute 1.2 (*)    Eosinophils Absolute 0.8 (*)    All other components within normal limits  RAPID URINE DRUG SCREEN, HOSP PERFORMED - Abnormal; Notable for the following components:   Opiates POSITIVE (*)    Amphetamines POSITIVE (*)    Tetrahydrocannabinol POSITIVE (*)    All other components within normal limits  ACETAMINOPHEN LEVEL - Abnormal; Notable for the following components:   Acetaminophen (Tylenol), Serum <10 (*)    All other components within normal limits  SARS CORONAVIRUS 2 (HOSPITAL ORDER, PERFORMED IN [facility])  ETHANOL  SALICYLATE LEVEL    EKG EKG Interpretation  Date/Time:  Thursday May 30 2019 11:20:25 EDT Ventricular Rate:  119 PR Interval:    QRS Duration: 94 QT Interval:  356 QTC Calculation: 501 R Axis:   104 Text Interpretation:  Sinus tachycardia Multiple ventricular premature complexes Aberrant complex Right axis deviation Minimal ST depression, inferior leads Prolonged QT interval No old tracing to compare Confirmed by Melene Plan [phone]) on 05/30/2019 11:23:50 AM   Radiology No results found.  Procedures .Critical Care Performed by: Cristina Gong, PA-C Authorized by: Cristina Gong, PA-C   Critical care provider statement:    Critical care time (minutes):  45   Critical care was necessary to treat or prevent imminent or life-threatening deterioration of the following conditions:  Toxidrome and respiratory failure   Critical care was time spent personally by me on the following activities:  Discussions with consultants, evaluation of patient's response to treatment,  examination of patient, ordering and performing treatments and interventions, ordering and review of laboratory studies, ordering and review of radiographic studies, pulse oximetry, re-evaluation of patient's condition, obtaining history from patient or surrogate and review of old charts   (including critical care time)  Medications Ordered in ED Medications  dicyclomine (BENTYL) tablet 20 mg (has no administration in time range)  hydrOXYzine (ATARAX/VISTARIL) tablet 25 mg (has no administration in time range)  loperamide (IMODIUM) capsule 2-4 mg (has no administration in time range)  methocarbamol (ROBAXIN) tablet 500 mg (has no administration in time range)  naproxen (NAPROSYN) tablet 500 mg (has no administration in time range)  ondansetron (ZOFRAN-ODT) disintegrating tablet 4 mg (has no administration in time range)  nicotine (NICODERM CQ - dosed in mg/24 hours) patch 21 mg (0 mg Transdermal Hold 05/30/19 1506)  ondansetron (ZOFRAN) injection 4 mg (4 mg Intravenous Given 05/30/19 1126)  sodium chloride 0.9 % bolus 1,000 mL (0 mLs Intravenous Stopped 05/30/19 1318)  diphenhydrAMINE (BENADRYL) capsule 25 mg (25 mg Oral Given 05/30/19 1151)     Initial Impression / Assessment and Plan / ED Course  I have reviewed the triage vital signs and the nursing notes.  Pertinent labs & imaging results that were available during my care of the patient were reviewed by me and considered in my medical decision making (see chart for details).  Clinical Course as of May 29 1506  Thu May 30, 2019  1143 Patient reevaluated.  He still appears drowsy however he is   awake and able to answer questions appropriately.  He is requesting something to eat and drink.  He states that today he took heroin, he does not think it was laced with anything.  He reports feeling very itchy.  He says that that is common for him if he takes something with codeine in it.  He states that this was an attempt to harm himself.   Patient's RN is aware.  If patient attempts to leave plan to IVC.   [EH]  1856 HR improved after fluids  Pulse Rate: 80 [EH]  1430 Patient reevaluated.  He reports that his pruritus has resolved.  He is slightly drowsy from the Benadryl however easily awakens and I suspect that this is Benadryl related.  UDS is positive for opioids, amphetamines, and THC.  COVID test is negative.   [EH]    Clinical Course User Index [EH] Lorin Glass, PA-C      Patient presents today for evaluation of a possible overdose.  On arrival he was responsive to voice, would open his eyes and attempt to set up while he was mumbling nonsensical phrases. He is satting at 98% on room air with adequate respiratory drive and respond significantly to stimulus that Narcan is initially held.  He was treated with Zofran to help prevent post Narcan vomiting, if it became needed.  He is tachycardic, chart review shows that his most recent UDS was positive for cocaine, opioids, and THC.  Basic labs ordered.  He is on cardiac monitoring, pulse oximetry, and end-tidal CO2.  On recheck patient was slightly drowsy however was coherent and able to answer questions.  He had generalized pruritus and p.o. Benadryl was ordered.  He was able to protect his own airway without difficulty and did not require Narcan while in my care.  UDS is positive for opioids, amphetamines, and THC.  Coronavirus test was ordered which was negative.  He told me that he had used drugs today as an attempt to harm himself and that he feels generally suicidal.  He denies HI or AVH.  He states that he wants to stop using opioids however he has been unable to so far.  He was recently admitted for MRSA bacteremia and fungemia however he completed his entire treatment course.  Here today he is afebrile, not tachycardic and tachypneic.  He does have a leukocytosis of 20, however I suspect that this is primarily reactive given his overdose.  As there are no other  infectious symptoms blood cultures and additional evaluation of this was not performed at this time.    Acetaminophen and salicylate levels were negative.  He does have a transaminitis which was noted on his recent admission.  Supportive meds for opioid withdrawal were ordered along with nicotine patch.  On arrival his tachycardia was treated with IV fluids after which it fully resolved.  This patient was seen as a shared visit with Dr. Adela Lank.  Patient is medically clear for psychiatric evaluation and disposition.  TTS orders placed.  Other MRN for primary chart [id number]  Final Clinical Impressions(s) / ED Diagnoses   Final diagnoses:  Polysubstance abuse (HCC)  Suicide attempt [facility])  Intentional drug overdose, initial encounter [facility])    ED Discharge Orders    None       Cristina Gong, PA-C 05/30/19 1507    Melene Plan, DO 05/31/19 [phone]

## 2019-05-30 NOTE — ED Notes (Signed)
Pt is now a lot more awake- sitting up requesting something to drink. Pt given coke and PA came back to bedside. Pt states he took something that may have been laced with codeine because he is so itchy. Pt also informed this RN & PA that nothing is going right in his life and he wanted to hurt himself by using these drugs due to his hopelessness and homelessness and feels he does not have any options to do better.    Pa informed me if he trys to leave we will IVC him due to her feeling he does need to stay and be seen by TTS.

## 2019-05-30 NOTE — ED Notes (Signed)
Pt father Edwards Mckelvie [phone]

## 2019-05-30 NOTE — BH Assessment (Signed)
Per Anette Riedel, NP pt is recommended for inpt tx. TTS to seek placement due to no appropriate beds at [facility]. Tiffany, RN has been advised. Patient referred to the following facilities for placement. Under review:    [facility]             [facility]  [facility]  [facility]  [facility]

## 2019-05-30 NOTE — Progress Notes (Signed)
Received snack.  

## 2019-05-30 NOTE — BH Assessment (Addendum)
Tele Assessment Note   Patient Name: Jon Castro MRN: [id number] Referring Physician: Ollen Gross Location of Patient: [facility] Location of Provider: [place]  Kolton Kienle is an 33 y.o. male who presents to the ED voluntarily due to an unintentional OD of heroin. Pt was reportedly found unresponsive and brought to the ED. Pt has a hx of accidental OD per chart review pt was recently admitted to [facility] due to accidental OD of heroin. Pt has been seen at [facility] and [facility] multiple times in the past due to depression and substance abuse.Pt denies to this writer that the OD was intentional however he reported to ED staff that "nothing is Castro right in his life and he wanted to hurt himself by using these drugs due to his hopelessness and homelessness and feels he does not have any options to do better."   Pt reports to this writer he feels suicidal and states he would walk into traffic. Pt identifies his stressors as being homeless, unable to find work, no supports, and sleeping outside. Pt states he recently started using heroin heavily. Pt denies that he is using any other substance although he is positive for cannabis and meth on arrival to ED. Pt is vague during the assessment and shares inconsistencies in his SI. Pt states he was recently d/c from [place] due to depression and substance abuse. Pt reports he has received substance abuse tx at [facility]. Pt states he is prescribed medication for depression but he cannot afford to have it filled. Pt endorses hopelessness, isolation, low mood, low self-worth, and thoughts of harm to self.   Per Anette Riedel, NP pt is recommended for inpt tx. TTS to seek placement due to no appropriate beds at [facility]. Pt's nurseTiffany, RN has been advised.   Diagnosis: MDD, recurrent, severe, w/o psychosis; Opiate use d/o, severe; Cannabis use d/o, severe; Stimulant use d/o, severe  Past Medical History:  Past Medical  History:  Diagnosis Date  . Drug abuse ([place])   . IVDU (intravenous drug user)   . LFTs abnormal     History reviewed. No pertinent surgical history.  Family History: History reviewed. No pertinent family history.  Social History:  reports that he has been smoking. He uses smokeless tobacco. He reports current alcohol use. He reports current drug use. Drugs: IV, Cocaine, Marijuana, and Amphetamines.  Additional Social History:  Alcohol / Drug Use Pain Medications: See MAR Prescriptions: See MAR Over the Counter: See MAR History of alcohol / drug use?: Yes Longest period of sobriety (when/how long): 1 month Negative Consequences of Use: Personal relationships, Work / Youth worker, Museum/gallery curator Withdrawal Symptoms: Patient aware of relationship between substance abuse and physical/medical complications Substance #1 Name of Substance 1: Heroin 1 - Age of First Use: 23 1 - Amount (size/oz): excessive 1 - Frequency: daily 1 - Duration: ongoing 1 - Last Use / Amount: 05/30/19 Substance #2 Name of Substance 2: Amphetamines 2 - Age of First Use: unknown, pt denies use although labs positive on arrival to ED 2 - Amount (size/oz): unknown, pt denies use although labs positive on arrival to ED 2 - Frequency: unknown, pt denies use although labs positive on arrival to ED 2 - Duration: unknown, pt denies use although labs positive on arrival to ED 2 - Last Use / Amount: unknown, pt denies use although labs positive on arrival to ED Substance #3 Name of Substance 3: Cannabis 3 - Age of First Use: unknown, pt denies  use although labs positive on arrival to ED 3 - Amount (size/oz): unknown, pt denies use although labs positive on arrival to ED 3 - Frequency: unknown, pt denies use although labs positive on arrival to ED 3 - Duration: unknown, pt denies use although labs positive on arrival to ED 3 - Last Use / Amount: unknown, pt denies use although labs positive on arrival to ED  CIWA: CIWA-Ar BP:  109/63 Pulse Rate: 67 COWS:    Allergies:  Allergies  Allergen Reactions  . Codeine   . Tramadol   . Vicodin [Hydrocodone-Acetaminophen]     Home Medications: (Not in a hospital admission)   OB/GYN Status:  No LMP for male patient.  General Assessment Data Location of Assessment: [facility] ED TTS Assessment: In system Is this a Tele or Face-to-Face Assessment?: Tele Assessment Is this an Initial Assessment or a Re-assessment for this encounter?: Initial Assessment Patient Accompanied by:: N/A Language Other than English: No Living Arrangements: Homeless/Shelter What gender do you identify as?: Male Marital status: Single Pregnancy Status: No Living Arrangements: Alone Can pt return to current living arrangement?: Yes Admission Status: Voluntary Is patient capable of signing voluntary admission?: Yes Referral Source: Self/Family/Friend Insurance type: none     Crisis Care Plan Living Arrangements: Alone Name of Psychiatrist: none Name of Therapist: none  Education Status Is patient currently in school?: No Is the patient employed, unemployed or receiving disability?: Unemployed  Risk to self with the past 6 months Suicidal Ideation: Yes-Currently Present Has patient been a risk to self within the past 6 months prior to admission? : Yes Suicidal Intent: No-Not Currently/Within Last 6 Months Has patient had any suicidal intent within the past 6 months prior to admission? : No Is patient at risk for suicide?: Yes Suicidal Plan?: Yes-Currently Present Has patient had any suicidal plan within the past 6 months prior to admission? : Yes Specify Current Suicidal Plan: pt tells this Clinical research associate he has thoughts of walking into traffic, reports to ED staff that he had thoughts of OD on drugs. Access to Means: Yes Specify Access to Suicidal Means: pt has access to drugs and traffic  What has been your use of drugs/alcohol within the last 12 months?: heroin, labs also positive for  cannabis and meth Previous Attempts/Gestures: Yes How many times?: (multiple) Other Self Harm Risks: hx of substance abuse, depression, SI Triggers for Past Attempts: Unknown Intentional Self Injurious Behavior: None Family Suicide History: No Recent stressful life event(s): Job Loss, Financial Problems, Other (Comment)(homeless, relapsed on heroin ) Persecutory voices/beliefs?: No Depression: Yes Depression Symptoms: Despondent, Isolating, Loss of interest in usual pleasures, Feeling worthless/self pity Substance abuse history and/or treatment for substance abuse?: Yes Suicide prevention information given to non-admitted patients: Not applicable  Risk to Others within the past 6 months Homicidal Ideation: No Does patient have any lifetime risk of violence toward others beyond the six months prior to admission? : No Thoughts of Harm to Others: No Current Homicidal Intent: No Current Homicidal Plan: No Access to Homicidal Means: No History of harm to others?: No Assessment of Violence: None Noted Does patient have access to weapons?: No Criminal Charges Pending?: No Does patient have a court date: No Is patient on probation?: No  Psychosis Hallucinations: None noted Delusions: None noted  Mental Status Report Appearance/Hygiene: Unremarkable, In scrubs Eye Contact: Good Motor Activity: Freedom of movement Speech: Logical/coherent Level of Consciousness: Alert Mood: Depressed, Sad, Helpless Affect: Depressed, Sad Anxiety Level: None Thought Processes: Relevant, Coherent Judgement: Impaired  Orientation: Place, Person, Time, Situation, Appropriate for developmental age Obsessive Compulsive Thoughts/Behaviors: None  Cognitive Functioning Concentration: Normal Memory: Recent Intact, Remote Intact Is patient IDD: No Insight: Poor Impulse Control: Poor Appetite: Good Have you had any weight changes? : No Change Sleep: No Change Total Hours of Sleep: 7 Vegetative  Symptoms: None  ADLScreening [facility] Assessment [facility]) Patient's cognitive ability adequate to safely complete daily activities?: Yes Patient able to express need for assistance with ADLs?: Yes Independently performs ADLs?: Yes (appropriate for developmental age)  Prior Inpatient Therapy Prior Inpatient Therapy: Yes Prior Therapy Dates: 2020 Prior [facility] Reason for Treatment: Substance abuse, depression  Prior Outpatient Therapy Prior Outpatient Therapy: No Does patient have an ACCT team?: No Does patient have Intensive In-House Services?  : No Does patient have [facility] services? : No Does patient have P4CC services?: No  ADL Screening (condition at time of admission) Patient's cognitive ability adequate to safely complete daily activities?: Yes Is the patient deaf or have difficulty hearing?: No Does the patient have difficulty seeing, even when wearing glasses/contacts?: No Does the patient have difficulty concentrating, remembering, or making decisions?: No Patient able to express need for assistance with ADLs?: Yes Does the patient have difficulty dressing or bathing?: No Independently performs ADLs?: Yes (appropriate for developmental age) Does the patient have difficulty walking or climbing stairs?: No Weakness of Legs: None Weakness of Arms/Hands: None  Home Assistive Devices/Equipment Home Assistive Devices/Equipment: None    Abuse/Neglect Assessment (Assessment to be complete while patient is alone) Abuse/Neglect Assessment Can Be Completed: Yes Physical Abuse: Denies Verbal Abuse: Denies Sexual Abuse: Denies Exploitation of patient/patient's resources: Denies Self-Neglect: Denies     Merchant navy officerAdvance Directives (For Healthcare) Does Patient Have a Medical Advance Directive?: No Would patient like information on creating a medical advance directive?: No - Patient declined          Disposition: Per Lerry Linerashaun Dixon, NP pt is  recommended for inpt tx. [facility] Pt's nurseTiffany, RN has been advised.   Disposition Initial Assessment Completed for this Encounter: Yes Disposition of Patient: Admit Type of inpatient treatment program: Adult Patient refused recommended treatment: No  This service was provided via telemedicine using a 2-way, interactive audio and video technology.  Names of all persons participating in this telemedicine service and their role in this encounter. Name:  Tad MooreKevin Duane Xin Role: Patient  Name: Princess BruinsAquicha Abbagail Scaff Role: TTS          Karolee Ohsquicha R Aneisha Skyles 05/30/2019 8:25 PM

## 2019-05-30 NOTE — ED Notes (Signed)
Pt arrived to Rm 50 - ambulatory - wearing burgundy scrubs. Sitter w/pt. Pt voiced understanding of Medical Clearance Pt Policy form - copy given. Pt wakens easily but returns to sleeping - drowsy.

## 2019-05-30 NOTE — Discharge Instructions (Addendum)
There is help if you need it.  Please do not use dirty needles, this could cause you a severe infection to your skin, heart or spinal cord.  This could kill you or leave you permanently disabled.  There was a recent study done at [facility] that showed that the risk of death for someone that had unintentionally overdosed on narcotics was as high as 15% in the next year.  This is much higher than most every other medical condition.  [facility] Solution to the Opioid Problem (GCSTOP) Fixed; mobile; peer-based Peri Maris [phone] [email or web address] Fixed site exchange at [facility], [place]. [place], [place] [address] on Wednesdays (2:00 - 5:00 pm) and Thursdays (4:00 - 8:00 pm). Pop-up mobile exchange locations: [employer], [place], [place] [address] on Tuesdays (11:00 am - 1:00 pm) and Fridays (11:00 am - 1:00 pm) [place], [address], [place], [place] [address] on Tuesdays (2:00 - 4:00 pm) and Fridays (2:00 - 4:00 pm)  [facility] ([facility]), [address], [place], [place] [address] on Thursdays (11:00 am - 3:00 pm)  [facility] - also serves Public relations account executive and [employer] Fixed; mobile; peer-based Rosemary Holms [phone] [email or web address] [address], [place], [place] [address] Delivery and outreach available in [place] and [place], please call for more information. [employer] ([facility] outreach in [place] and [place]) Knina Strichariz [phone]  [email or web address] Exchange available 2:00 - 8:00 pm (except Wednesdays). Please call or text for more information.

## 2019-05-30 NOTE — ED Notes (Signed)
[place] advised TTS will be performed next shift.

## 2019-05-30 NOTE — Patient Outreach (Signed)
Jon Castro, CPSS and Jon Castro, CPSS met with the patient in the [facility] to provide substance use recovery support and provide information for substance use recovery resources. Patient reported a history of IV heroin use. Patient endorsed SI and was requesting help with a psychiatric hospital admission at [facility] or [facility].  Additionally, CPSS explained to the patient that he will most likely be evaluated by TTS to see whether or not he meets inpatient psychiatric hospital admission criteria at this time. CPSS still provided the patient with information for several different substance use recovery resources. Some of these resources include residential/outpatient substance use treatment center list, detox center list, NA meeting list, and CPSS contact information. CPSS encouraged the patient to follow up with CPSS if needed for further substance use recovery support.

## 2019-05-31 DIAGNOSIS — T401X1A Poisoning by heroin, accidental (unintentional), initial encounter: Secondary | ICD-10-CM

## 2019-05-31 HISTORY — DX: Poisoning by heroin, accidental (unintentional), initial encounter: T40.1X1A

## 2019-05-31 NOTE — ED Notes (Signed)
Breakfast ordered 

## 2019-05-31 NOTE — Consult Note (Signed)
Telepsych Consultation   Reason for Consult:  Overdose Referring Physician:  EDP Location of Patient: [facility] Location of Provider: Behavioral Health TTS Department  Patient Identification: Jon Castro MRN:  [id number] Principal Diagnosis: Overdose of heroin, accidental or unintentional, initial encounter [facility](HCC) Diagnosis:  Principal Problem:   Overdose of heroin, accidental or unintentional, initial encounter (HCC)   Total Time spent with patient: 30 minutes.   Subjective:   Jon Castro is a 33 y.o. male patient found unresponsive after unintentional heroin overdose. During the evaluation patient reports not being able to recall the events leading up to yesterday. Otherwise he is a good historian and provides a recount of his depressive symptoms and his triggers. He reports he has been depressed for about 1-2 weeks. He reports symptoms of sadness, hopelessness, and decreased sleep. He reports his triggers are homelessness, nothing working out right, and unemployed. He states he has never harmed himself and or attempted to engage in suicidal behaviors. He reports a history of substance abuse to include most recently heroin, and he denies any other substances despite UDS being positive for meth and THC. At the time of the assessment he is denying Si/HI/AVH.   HPI:  Jon Castro is an 33 y.o. male who presents to the ED voluntarily due to an unintentional OD of heroin. Pt was reportedly found unresponsive and brought to the ED. Pt has a hx of accidental OD per chart review [facility]. [facility] in the past due to depression and substance abuse.Pt denies to this writer that the OD was intentional however he reported to ED staff that "nothing is going right in his life and he wanted to hurt himself by using these drugs due to his hopelessness and homelessness and feels he does not have any  options to do better."   Pt reports to this writer he feels suicidal and states he would walk into traffic. Pt identifies his stressors as being homeless, unable to find work, no supports, and sleeping outside. Pt states he recently started using heroin heavily. Pt denies that he is using any other substance although he is positive for cannabis and meth on arrival to ED. Pt is vague during the assessment and shares inconsistencies in his SI. Pt states he was [facility] due to depression and substance abuse. Pt reports he [facility]. Pt states he is prescribed medication for depression but he cannot afford to have it filled. Pt endorses hopelessness, isolation, low mood, low self-worth, and thoughts of harm to self.   Past Psychiatric History: Depression and substance abuse.   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 Access to Means: Yes Specify Access to Suicidal Means: pt has access to drugs and traffic  What has been your use of drugs/alcohol within the last 12 months?: heroin, labs also positive for cannabis and meth How many times?: (multiple) Other Self Harm Risks: hx of substance abuse, depression, SI Triggers for Past Attempts: Unknown Intentional Self Injurious Behavior: None Risk to Others: Homicidal Ideation: No Thoughts of Harm to Others: No Current Homicidal Intent: No Current  Homicidal Plan: No Access to Homicidal Means: No History of harm to others?: No Assessment of Violence: None Noted Does patient have access to weapons?: No Criminal Charges Pending?: No Does patient have a court date: No Prior Inpatient Therapy: Prior Inpatient  Therapy: Yes Prior Therapy Dates: 2020 Prior Therapy Facilty/Provider(s): [facility] Reason for Treatment: Substance abuse, depression Prior Outpatient Therapy: Prior Outpatient Therapy: No Does patient have an ACCT team?: No Does patient have Intensive In-House Services?  : No Does patient have [facility] services? : No Does patient have P4CC services?: No  Past Medical History:  Past Medical History:  Diagnosis Date  . Drug abuse (HCC)   . IVDU (intravenous drug user)   . LFTs abnormal    History reviewed. No pertinent surgical history. Family History: History reviewed. No pertinent family history. Family Psychiatric  History: he denies Social History: He is currently living in between places. He has one child age 63 he lives with his mother. He denies any legal charges at this time.  Social History   Substance and Sexual Activity  Alcohol Use Yes     Social History   Substance and Sexual Activity  Drug Use Yes  . Types: IV, Cocaine, Marijuana, Amphetamines    Social History   Socioeconomic History  . Marital status: Unknown    Spouse name: Not on file  . Number of children: Not on file  . Years of education: Not on file  . Highest education level: Not on file  Occupational History  . Not on file  Social Needs  . Financial resource strain: Not on file  . Food insecurity    Worry: Not on file    Inability: Not on file  . Transportation needs    Medical: Not on file    Non-medical: Not on file  Tobacco Use  . Smoking status: Current Every Day Smoker  . Smokeless tobacco: Current User  Substance and Sexual Activity  . Alcohol use: Yes  . Drug use: Yes    Types: IV, Cocaine, Marijuana, Amphetamines  . Sexual activity: Not on file  Lifestyle  . Physical activity    Days per week: Not on file    Minutes per session: Not on file  . Stress: Not on file  Relationships  . Social Musician on phone: Not on file    Gets together: Not on file     Attends religious service: Not on file    Active member of club or organization: Not on file    Attends meetings of clubs or organizations: Not on file    Relationship status: Not on file  Other Topics Concern  . Not on file  Social History Narrative  . Not on file   Additional Social History:    Allergies:   Allergies  Allergen Reactions  . Codeine   . Tramadol   . Vicodin [Hydrocodone-Acetaminophen]     Labs:  Results for orders placed or performed during the hospital encounter of 05/30/19 (from the past 48 hour(s))  Comprehensive metabolic panel     Status: Abnormal   Collection Time: 05/30/19 11:14 AM  Result Value Ref Range   Sodium 136 135 - 145 mmol/L   Potassium 4.0 3.5 - 5.1 mmol/L    Comment: HEMOLYSIS AT THIS LEVEL MAY AFFECT RESULT   Chloride 106 98 - 111 mmol/L   CO2 19 (L) 22 - 32 mmol/L   Glucose, Bld 178 (H) 70 - 99 mg/dL  BUN 19 6 - 20 mg/dL   Creatinine, Ser 6.04 0.61 - 1.24 mg/dL   Calcium 8.3 (L) 8.9 - 10.3 mg/dL   Total Protein 7.1 6.5 - 8.1 g/dL   Albumin 3.7 3.5 - 5.0 g/dL   AST 540 (H) 15 - 41 U/L   ALT 321 (H) 0 - 44 U/L   Alkaline Phosphatase 164 (H) 38 - 126 U/L   Total Bilirubin 1.0 0.3 - 1.2 mg/dL   GFR calc non Af Amer >60 >60 mL/min   GFR calc Af Amer >60 >60 mL/min   Anion gap 11 5 - 15    Comment: Performed at [facility], [address], [place], [place] [address]  CBC with Differential     Status: Abnormal   Collection Time: 05/30/19 11:14 AM  Result Value Ref Range   WBC 20.1 (H) 4.0 - 10.5 K/uL   RBC 4.84 4.22 - 5.81 MIL/uL   Hemoglobin 13.3 13.0 - 17.0 g/dL   HCT 14.7 82.9 - 56.2 %   MCV 82.9 80.0 - 100.0 fL   MCH 27.5 26.0 - 34.0 pg   MCHC 33.2 30.0 - 36.0 g/dL   RDW 13.0 86.5 - 78.4 %   Platelets 448 (H) 150 - 400 K/uL   nRBC 0.0 0.0 - 0.2 %   Neutrophils Relative % 48 %   Neutro Abs 9.8 (H) 1.7 - 7.7 K/uL   Band Neutrophils 1 %   Lymphocytes Relative 41 %   Lymphs Abs 8.2 (H) 0.7 - 4.0 K/uL   Monocytes  Relative 6 %   Monocytes Absolute 1.2 (H) 0.1 - 1.0 K/uL   Eosinophils Relative 4 %   Eosinophils Absolute 0.8 (H) 0.0 - 0.5 K/uL   Basophils Relative 0 %   Basophils Absolute 0.0 0.0 - 0.1 K/uL   Abs Immature Granulocytes 0.00 0.00 - 0.07 K/uL   Reactive, Benign Lymphocytes PRESENT     Comment: Performed at [facility], [address] [place], [place] [address]  Ethanol     Status: None   Collection Time: 05/30/19 11:15 AM  Result Value Ref Range   Alcohol, Ethyl (B) <10 <10 mg/dL    Comment: (NOTE) Lowest detectable limit for serum alcohol is 10 mg/dL. For medical purposes only. Performed at [facility], [address] [place], [place] [address]   Acetaminophen level     Status: Abnormal   Collection Time: 05/30/19 11:15 AM  Result Value Ref Range   Acetaminophen (Tylenol), Serum <10 (L) 10 - 30 ug/mL    Comment: (NOTE) Therapeutic concentrations vary significantly. A range of 10-30 ug/mL  may be an effective concentration for many patients. However, some  are best treated at concentrations outside of this range. Acetaminophen concentrations >150 ug/mL at 4 hours after ingestion  and >50 ug/mL at 12 hours after ingestion are often associated with  toxic reactions. Performed at [facility], [address], [place], [place] [address]   Salicylate level     Status: None   Collection Time: 05/30/19 11:15 AM  Result Value Ref Range   Salicylate Lvl <7.0 2.8 - 30.0 mg/dL    Comment: Performed at [facility], [address], [place], [place] [address]  SARS Coronavirus 2 [facility] order, Performed in [facility] hospital lab) Nasopharyngeal Nasopharyngeal Swab     Status: None   Collection Time: 05/30/19 12:23 PM   Specimen: Nasopharyngeal Swab  Result Value Ref Range  SARS Coronavirus 2 NEGATIVE NEGATIVE    Comment: (NOTE) If result is NEGATIVE SARS-CoV-2 target nucleic acids are NOT DETECTED. The SARS-CoV-2 RNA is generally detectable in upper  and lower  respiratory specimens during the acute phase of infection. The lowest  concentration of SARS-CoV-2 viral copies this assay can detect is 250  copies / mL. A negative result does not preclude SARS-CoV-2 infection  and should not be used as the sole basis for treatment or other  patient management decisions.  A negative result may occur with  improper specimen collection / handling, submission of specimen other  than nasopharyngeal swab, presence of viral mutation(s) within the  areas targeted by this assay, and inadequate number of viral copies  (<250 copies / mL). A negative result must be combined with clinical  observations, patient history, and epidemiological information. If result is POSITIVE SARS-CoV-2 target nucleic acids are DETECTED. The SARS-CoV-2 RNA is generally detectable in upper and lower  respiratory specimens dur ing the acute phase of infection.  Positive  results are indicative of active infection with SARS-CoV-2.  Clinical  correlation with patient history and other diagnostic information is  necessary to determine patient infection status.  Positive results do  not rule out bacterial infection or co-infection with other viruses. If result is PRESUMPTIVE POSTIVE SARS-CoV-2 nucleic acids MAY BE PRESENT.   A presumptive positive result was obtained on the submitted specimen  and confirmed on repeat testing.  While 2019 novel coronavirus  (SARS-CoV-2) nucleic acids may be present in the submitted sample  additional confirmatory testing may be necessary for epidemiological  and / or clinical management purposes  to differentiate between  SARS-CoV-2 and other Sarbecovirus currently known to infect humans.  If clinically indicated additional testing with an alternate test  methodology [phone]) is advised. The SARS-CoV-2 RNA is generally  detectable in upper and lower respiratory sp ecimens during the acute  phase of infection. The expected result is  Negative. Fact Sheet for Patients:  StrictlyIdeas.no Fact Sheet for Healthcare Providers: BankingDealers.co.za This test is not yet approved or cleared by the [place] FDA and has been authorized for detection and/or diagnosis of SARS-CoV-2 by FDA under an Emergency Use Authorization (EUA).  This EUA will remain in effect (meaning this test can be used) for the duration of the COVID-19 declaration under Section 564(b)(1) of the Act, 21 U.S.C. section 360bbb-3(b)(1), unless the authorization is terminated or revoked sooner. Performed at [facility], [facility] [address], [place], [place] [address]   Urine rapid drug screen (hosp performed)     Status: Abnormal   Collection Time: 05/30/19 12:50 PM  Result Value Ref Range   Opiates POSITIVE (A) NONE DETECTED   Cocaine NONE DETECTED NONE DETECTED   Benzodiazepines NONE DETECTED NONE DETECTED   Amphetamines POSITIVE (A) NONE DETECTED   Tetrahydrocannabinol POSITIVE (A) NONE DETECTED   Barbiturates NONE DETECTED NONE DETECTED    Comment: (NOTE) DRUG SCREEN FOR MEDICAL PURPOSES ONLY.  IF CONFIRMATION IS NEEDED FOR ANY PURPOSE, NOTIFY LAB WITHIN 5 DAYS. LOWEST DETECTABLE LIMITS FOR URINE DRUG SCREEN Drug Class                     Cutoff (ng/mL) Amphetamine and metabolites    1000 Barbiturate and metabolites    200 Benzodiazepine                 326 Tricyclics and metabolites     300 Opiates and metabolites  300 Cocaine and metabolites        300 THC                            50 Performed at [facility], [address] [place], [place] [address]     Medications:  Current Facility-Administered Medications  Medication Dose Route Frequency Provider Last Rate Last Dose  . dicyclomine (BENTYL) tablet 20 mg  20 mg Oral Q6H PRN Cristina Gong, PA-C      . hydrOXYzine (ATARAX/VISTARIL) tablet 25 mg  25 mg Oral Q6H PRN Cristina Gong, PA-C      . loperamide  (IMODIUM) capsule 2-4 mg  2-4 mg Oral PRN Cristina Gong, PA-C      . methocarbamol (ROBAXIN) tablet 500 mg  500 mg Oral Q8H PRN Cristina Gong, PA-C      . naproxen (NAPROSYN) tablet 500 mg  500 mg Oral BID PRN Cristina Gong, PA-C      . nicotine (NICODERM CQ - dosed in mg/24 hours) patch 21 mg  21 mg Transdermal Once Cristina Gong, PA-C   Stopped at 05/30/19 1506  . ondansetron (ZOFRAN-ODT) disintegrating tablet 4 mg  4 mg Oral Q6H PRN Cristina Gong, PA-C       No current outpatient medications on file.    Musculoskeletal: Strength & Muscle Tone: within normal limits Gait & Station: normal Patient leans: N/A  Psychiatric Specialty Exam: Physical Exam  ROS  Blood pressure 121/78, pulse (!) 51, temperature 97.9 F (36.6 C), temperature source Oral, resp. rate 20, SpO2 100 %.There is no height or weight on file to calculate BMI.  General Appearance: Fairly Groomed  Eye Contact:  Good  Speech:  Clear and Coherent and Normal Rate  Volume:  Normal  Mood:  normal  Affect:  Appropriate and Congruent  Thought Process:  Coherent, Linear and Descriptions of Associations: Intact  Orientation:  Full (Time, Place, and Person)  Thought Content:  WDL  Suicidal Thoughts:  No  Homicidal Thoughts:  No  Memory:  Immediate;   Fair Recent;   Fair  Judgement:  Intact  Insight:  Fair  Psychomotor Activity:  Normal  Concentration:  Concentration: Fair and Attention Span: Good  Recall:  Good  Fund of Knowledge:  Good  Language:  Good  Akathisia:  No  Handed:  Right  AIMS (if indicated):     Assets:  Communication Skills Desire for Improvement Physical Health Social Support Vocational/Educational  ADL's:  Intact  Cognition:  WNL  Sleep:        Treatment Plan Summary: Daily contact with patient to assess and evaluate symptoms and progress in treatment and Medication management  Disposition: No evidence of imminent risk to self or others at present.    Patient does not meet criteria for psychiatric inpatient admission. Supportive therapy provided about ongoing stressors. Discussed crisis plan, support from social network, calling 911, coming to the Emergency Department, and calling Suicide Hotline.  This service was provided via telemedicine using a 2-way, interactive audio and video technology.  Names of all persons participating in this telemedicine service and their role in this encounter. Name: Caryn Bee Role: FNP-BC  Name: Caryn Bee Rayson Role: patient    Maryagnes Amos, FNP 05/31/2019 12:46 PM

## 2019-06-03 ENCOUNTER — Encounter (HOSPITAL_COMMUNITY): Payer: Self-pay | Admitting: Cardiology

## 2019-09-29 DIAGNOSIS — B182 Chronic viral hepatitis C: Secondary | ICD-10-CM | POA: Insufficient documentation

## 2019-10-12 ENCOUNTER — Inpatient Hospital Stay (HOSPITAL_COMMUNITY)
Admission: EM | Admit: 2019-10-12 | Discharge: 2019-10-15 | DRG: 603 | Disposition: A | Discharge status: Home / Self Care | Payer: Self-pay | Attending: Internal Medicine | Admitting: Internal Medicine

## 2019-10-12 ENCOUNTER — Encounter (HOSPITAL_COMMUNITY): Payer: Self-pay | Admitting: Emergency Medicine

## 2019-10-12 ENCOUNTER — Other Ambulatory Visit: Payer: Self-pay

## 2019-10-12 ENCOUNTER — Emergency Department (HOSPITAL_COMMUNITY): Payer: Self-pay

## 2019-10-12 DIAGNOSIS — F329 Major depressive disorder, single episode, unspecified: Secondary | ICD-10-CM | POA: Diagnosis present

## 2019-10-12 DIAGNOSIS — L0390 Cellulitis, unspecified: Secondary | ICD-10-CM | POA: Diagnosis present

## 2019-10-12 DIAGNOSIS — L03115 Cellulitis of right lower limb: Principal | ICD-10-CM | POA: Diagnosis present

## 2019-10-12 DIAGNOSIS — B1920 Unspecified viral hepatitis C without hepatic coma: Secondary | ICD-10-CM | POA: Diagnosis present

## 2019-10-12 DIAGNOSIS — R6510 Systemic inflammatory response syndrome (SIRS) of non-infectious origin without acute organ dysfunction: Secondary | ICD-10-CM

## 2019-10-12 DIAGNOSIS — F1110 Opioid abuse, uncomplicated: Secondary | ICD-10-CM | POA: Diagnosis present

## 2019-10-12 DIAGNOSIS — Z79899 Other long term (current) drug therapy: Secondary | ICD-10-CM

## 2019-10-12 DIAGNOSIS — Z20822 Contact with and (suspected) exposure to covid-19: Secondary | ICD-10-CM | POA: Diagnosis present

## 2019-10-12 DIAGNOSIS — Z590 Homelessness: Secondary | ICD-10-CM

## 2019-10-12 DIAGNOSIS — Z8614 Personal history of Methicillin resistant Staphylococcus aureus infection: Secondary | ICD-10-CM

## 2019-10-12 DIAGNOSIS — F17200 Nicotine dependence, unspecified, uncomplicated: Secondary | ICD-10-CM | POA: Diagnosis present

## 2019-10-12 DIAGNOSIS — W2111XA Struck by baseball bat, initial encounter: Secondary | ICD-10-CM

## 2019-10-12 DIAGNOSIS — F1990 Other psychoactive substance use, unspecified, uncomplicated: Secondary | ICD-10-CM

## 2019-10-12 DIAGNOSIS — F322 Major depressive disorder, single episode, severe without psychotic features: Secondary | ICD-10-CM | POA: Diagnosis present

## 2019-10-12 DIAGNOSIS — Z885 Allergy status to narcotic agent status: Secondary | ICD-10-CM

## 2019-10-12 DIAGNOSIS — F1124 Opioid dependence with opioid-induced mood disorder: Secondary | ICD-10-CM | POA: Diagnosis present

## 2019-10-12 MED ORDER — VANCOMYCIN HCL IN DEXTROSE 1-5 GM/200ML-% IV SOLN
1000.0000 mg | Freq: Once | INTRAVENOUS | Status: AC
  Administered 2019-10-13: 1000 mg via INTRAVENOUS
  Filled 2019-10-12: qty 200

## 2019-10-12 MED ORDER — KETOROLAC TROMETHAMINE 30 MG/ML IJ SOLN
15.0000 mg | Freq: Once | INTRAMUSCULAR | Status: DC
  Filled 2019-10-12: qty 1

## 2019-10-12 MED ORDER — ACETAMINOPHEN 500 MG PO TABS
1000.0000 mg | ORAL_TABLET | Freq: Once | ORAL | Status: DC
  Filled 2019-10-12: qty 2
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
## 2019-10-12 NOTE — ED Notes (Signed)
Right anterior shin red, warm to the touch.
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
## 2019-10-13 ENCOUNTER — Observation Stay (HOSPITAL_BASED_OUTPATIENT_CLINIC_OR_DEPARTMENT_OTHER): Payer: Self-pay

## 2019-10-13 DIAGNOSIS — L0390 Cellulitis, unspecified: Secondary | ICD-10-CM

## 2019-10-13 DIAGNOSIS — M79609 Pain in unspecified limb: Secondary | ICD-10-CM

## 2019-10-13 DIAGNOSIS — L03115 Cellulitis of right lower limb: Principal | ICD-10-CM

## 2019-10-13 HISTORY — DX: Cellulitis, unspecified: L03.90

## 2019-10-13 LAB — COMPREHENSIVE METABOLIC PANEL
ALT: 76 U/L — ABNORMAL HIGH (ref 0–44)
AST: 81 U/L — ABNORMAL HIGH (ref 15–41)
Albumin: 3.2 g/dL — ABNORMAL LOW (ref 3.5–5.0)
Alkaline Phosphatase: 62 U/L (ref 38–126)
Anion gap: 10 (ref 5–15)
BUN: 10 mg/dL (ref 6–20)
CO2: 24 mmol/L (ref 22–32)
Calcium: 8.5 mg/dL — ABNORMAL LOW (ref 8.9–10.3)
Chloride: 105 mmol/L (ref 98–111)
Creatinine, Ser: 1.06 mg/dL (ref 0.61–1.24)
GFR calc Af Amer: >60 mL/min (ref >60)
GFR calc non Af Amer: >60 mL/min (ref >60)
Glucose, Bld: 113 mg/dL — ABNORMAL HIGH (ref 70–99)
Potassium: 3.5 mmol/L (ref 3.5–5.1)
Sodium: 139 mmol/L (ref 135–145)
Total Bilirubin: 0.9 mg/dL (ref 0.3–1.2)
Total Protein: 6.8 g/dL (ref 6.5–8.1)

## 2019-10-13 LAB — BASIC METABOLIC PANEL
Anion gap: 11 (ref 5–15)
BUN: 9 mg/dL (ref 6–20)
CO2: 23 mmol/L (ref 22–32)
Calcium: 8.8 mg/dL — ABNORMAL LOW (ref 8.9–10.3)
Chloride: 104 mmol/L (ref 98–111)
Creatinine, Ser: 0.85 mg/dL (ref 0.61–1.24)
GFR calc Af Amer: >60 mL/min (ref >60)
GFR calc non Af Amer: >60 mL/min (ref >60)
Glucose, Bld: 151 mg/dL — ABNORMAL HIGH (ref 70–99)
Potassium: 3.5 mmol/L (ref 3.5–5.1)
Sodium: 138 mmol/L (ref 135–145)

## 2019-10-13 LAB — CBC
HCT: 41.5 % (ref 39.0–52.0)
Hemoglobin: 13.6 g/dL (ref 13.0–17.0)
MCH: 26.8 pg (ref 26.0–34.0)
MCHC: 32.8 g/dL (ref 30.0–36.0)
MCV: 81.9 fL (ref 80.0–100.0)
Platelets: 174 10*3/uL (ref 150–400)
RBC: 5.07 MIL/uL (ref 4.22–5.81)
RDW: 16.2 % — ABNORMAL HIGH (ref 11.5–15.5)
WBC: 13.8 10*3/uL — ABNORMAL HIGH (ref 4.0–10.5)
nRBC: 0 % (ref 0.0–0.2)

## 2019-10-13 LAB — CBC WITH DIFFERENTIAL/PLATELET
Abs Immature Granulocytes: 0.07 10*3/uL (ref 0.00–0.07)
Basophils Absolute: 0 10*3/uL (ref 0.0–0.1)
Basophils Relative: 0 %
Eosinophils Absolute: 0 10*3/uL (ref 0.0–0.5)
Eosinophils Relative: 0 %
HCT: 40.5 % (ref 39.0–52.0)
Hemoglobin: 13.2 g/dL (ref 13.0–17.0)
Immature Granulocytes: 0 %
Lymphocytes Relative: 16 %
Lymphs Abs: 2.5 10*3/uL (ref 0.7–4.0)
MCH: 27 pg (ref 26.0–34.0)
MCHC: 32.6 g/dL (ref 30.0–36.0)
MCV: 83 fL (ref 80.0–100.0)
Monocytes Absolute: 1.3 10*3/uL — ABNORMAL HIGH (ref 0.1–1.0)
Monocytes Relative: 9 %
Neutro Abs: 11.7 10*3/uL — ABNORMAL HIGH (ref 1.7–7.7)
Neutrophils Relative %: 75 %
Platelets: 212 10*3/uL (ref 150–400)
RBC: 4.88 MIL/uL (ref 4.22–5.81)
RDW: 16.2 % — ABNORMAL HIGH (ref 11.5–15.5)
WBC: 15.7 10*3/uL — ABNORMAL HIGH (ref 4.0–10.5)
nRBC: 0 % (ref 0.0–0.2)

## 2019-10-13 LAB — LACTIC ACID, PLASMA: Lactic Acid, Venous: 1.8 mmol/L (ref 0.5–1.9)

## 2019-10-13 LAB — SARS CORONAVIRUS 2 (TAT 6-24 HRS): SARS Coronavirus 2: NEGATIVE

## 2019-10-13 MED ORDER — ACETAMINOPHEN 650 MG RE SUPP: 650.0000 mg | Freq: Four times a day (QID) | RECTAL | Status: DC | PRN

## 2019-10-13 MED ORDER — ACETAMINOPHEN 325 MG PO TABS
650.0000 mg | ORAL_TABLET | Freq: Four times a day (QID) | ORAL | Status: DC | PRN
  Administered 2019-10-13: 650 mg via ORAL
  Filled 2019-10-13: qty 2

## 2019-10-13 MED ORDER — NICOTINE 21 MG/24HR TD PT24
21.0000 mg | MEDICATED_PATCH | Freq: Every day | TRANSDERMAL | Status: DC
  Administered 2019-10-13 – 2019-10-15 (×3): 21 mg via TRANSDERMAL
  Filled 2019-10-13 (×3): qty 1

## 2019-10-13 MED ORDER — VANCOMYCIN HCL IN DEXTROSE 1-5 GM/200ML-% IV SOLN
1000.0000 mg | Freq: Three times a day (TID) | INTRAVENOUS | Status: DC
  Administered 2019-10-13: 1000 mg via INTRAVENOUS
  Filled 2019-10-13 (×3): qty 200

## 2019-10-13 MED ORDER — HYDROCODONE-ACETAMINOPHEN 5-325 MG PO TABS
1.0000 | ORAL_TABLET | Freq: Four times a day (QID) | ORAL | Status: DC | PRN
  Administered 2019-10-13 – 2019-10-15 (×6): 1 via ORAL
  Filled 2019-10-13 (×6): qty 1

## 2019-10-13 MED ORDER — SODIUM CHLORIDE 0.9% FLUSH
3.0000 mL | Freq: Two times a day (BID) | INTRAVENOUS | Status: DC
  Administered 2019-10-13 – 2019-10-14 (×4): 3 mL via INTRAVENOUS

## 2019-10-13 MED ORDER — SODIUM CHLORIDE 0.9 % IV SOLN: 250.0000 mL | INTRAVENOUS | Status: DC | PRN

## 2019-10-13 MED ORDER — VANCOMYCIN HCL 1250 MG/250ML IV SOLN
1250.0000 mg | Freq: Two times a day (BID) | INTRAVENOUS | Status: DC
  Administered 2019-10-13 – 2019-10-15 (×4): 1250 mg via INTRAVENOUS
  Filled 2019-10-13 (×5): qty 250

## 2019-10-13 MED ORDER — ENOXAPARIN SODIUM 40 MG/0.4ML ~~LOC~~ SOLN
40.0000 mg | Freq: Every day | SUBCUTANEOUS | Status: DC
  Filled 2019-10-13 (×2): qty 0.4

## 2019-10-13 MED ORDER — SODIUM CHLORIDE 0.9% FLUSH: 3.0000 mL | INTRAVENOUS | Status: DC | PRN

## 2019-10-13 MED ORDER — SODIUM CHLORIDE 0.9 % IV SOLN
1.0000 g | Freq: Every day | INTRAVENOUS | Status: DC
  Administered 2019-10-13 – 2019-10-14 (×3): 1 g via INTRAVENOUS
  Filled 2019-10-13 (×3): qty 10

## 2019-10-13 NOTE — Progress Notes (Signed)
Patient is a 34 year old male with history of current IV drug abuse with heroine, hepatitis C who presented with pain, swelling, redness of right lower extremities.  On presentation he was found to have cellulitis of right lower extremity.  Started on broad-spectrum antibiotics. Currently is a hemodynamically stable, febrile.  We will continue current antibiotics for now. Will check venous Doppler of the right lower extremity. Patient is actively using heroin.  Counseled cessation.  Social worker consulted.  He also has been diagnosed with hepatitis C last year.  Will check HCV RNA. Patient seen by Dr. Selena Batten this morning.  Agree with assessment and plan.
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
## 2019-10-13 NOTE — Progress Notes (Signed)
PHARMACY NOTE:  ANTIMICROBIAL RENAL DOSAGE ADJUSTMENT  Current antimicrobial regimen includes a mismatch between antimicrobial dosage and estimated renal function.  As per policy approved by the Pharmacy & Therapeutics and Medical Executive Committees, the antimicrobial dosage will be adjusted accordingly.  Current antimicrobial dosage:  Vancomycin 1000 mg IV q8hr  Indication: MRSA infection   Renal Function: Scr elevated to 1.06 from 0.85 (baseline)  Estimated Creatinine Clearance: 101.4 mL/min (by C-G formula based on SCr of 1.06 mg/dL).  Antimicrobial dosage has been changed to:  Vancomycin 1250 mg IV q12hr. Goal AUC 400-550. Expected AUC: 480. SCr used: 1.06   Additional comments: Will continue to monitor renal function   Thank you for allowing pharmacy to be a part of this patient's care.  Charlett Nose, PharmD  PGY1 Acute Care Pharmacy Resident 10/13/2019 2:53 PM

## 2019-10-13 NOTE — ED Notes (Signed)
Pt expressed he did not want to wear the tele monitor.

## 2019-10-13 NOTE — Progress Notes (Signed)
Right lower extremity venous duplex exam completed.  Preliminary results can be found under CV proc under chart review.  10/13/2019 12:00 PM  Jon Castro, K., RDMS, RVT

## 2019-10-13 NOTE — ED Notes (Addendum)
During handoff from prior RN it was reported that meds (toradol and tylenol) were given, but prior RN did not chart they were given.

## 2019-10-13 NOTE — Progress Notes (Signed)
Pharmacy Antibiotic Note  Jon Castro is a 34 y.o. male admitted on 10/12/2019 with cellulitis.  Pharmacy has been consulted for Vancomycin dosing. Recent MRSA infection. WBC elevated. Renal function good.   Plan: Vancomycin 1000 mg IV q8h >>Estimated AUC: 488 Trend WBC, temp, renal function  F/U infectious work-up Drug levels as indicated   Wt: ~170-180 lbs per patient -110 lb weight listed in Epic is an error  Temp (24hrs), Avg:98.6 F (37 C), Min:97.8 F (36.6 C), Max:99.3 F (37.4 C)  Recent Labs  Lab 10/12/19 2330  WBC 15.7*  CREATININE 0.85  LATICACIDVEN 1.8    Estimated Creatinine Clearance: 86.4 mL/min (by C-G formula based on SCr of 0.85 mg/dL).    Allergies  Allergen Reactions  . Vicodin [Hydrocodone-Acetaminophen] Itching  . Codeine Nausea And Vomiting  . Codeine   . Tramadol Other (See Comments)    "messes with head"  . Tramadol   . Vicodin [Hydrocodone-Acetaminophen]    Abran Duke, PharmD, BCPS Clinical Pharmacist Phone: [phone]

## 2019-10-14 LAB — CREATININE, SERUM
Creatinine, Ser: 0.78 mg/dL (ref 0.61–1.24)
GFR calc Af Amer: >60 mL/min (ref >60)
GFR calc non Af Amer: >60 mL/min (ref >60)

## 2019-10-14 MED ORDER — FENTANYL CITRATE (PF) 100 MCG/2ML IJ SOLN
INTRAMUSCULAR | Status: AC
  Filled 2019-10-14: qty 2

## 2019-10-14 MED ORDER — FENTANYL CITRATE (PF) 100 MCG/2ML IJ SOLN
12.5000 ug | Freq: Once | INTRAMUSCULAR | Status: AC
  Administered 2019-10-14: 12.5 ug via INTRAVENOUS

## 2019-10-14 NOTE — Plan of Care (Signed)
  Problem: Clinical Measurements: Goal: Ability to avoid or minimize complications of infection will improve Outcome: Progressing   Problem: Skin Integrity: Goal: Skin integrity will improve Outcome: Progressing   Problem: Health Behavior/Discharge Planning: Goal: Ability to manage health-related needs will improve Outcome: Progressing   Problem: Clinical Measurements: Goal: Will remain free from infection Outcome: Progressing   Problem: Pain Managment: Goal: General experience of comfort will improve Outcome: Progressing   Problem: Safety: Goal: Ability to remain free from injury will improve Outcome: Progressing   Problem: Skin Integrity: Goal: Risk for impaired skin integrity will decrease Outcome: Progressing
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
## 2019-10-15 LAB — CBC WITH DIFFERENTIAL/PLATELET
Abs Immature Granulocytes: 0.05 10*3/uL (ref 0.00–0.07)
Basophils Absolute: 0.1 10*3/uL (ref 0.0–0.1)
Basophils Relative: 1 %
Eosinophils Absolute: 0.3 10*3/uL (ref 0.0–0.5)
Eosinophils Relative: 3 %
HCT: 40 % (ref 39.0–52.0)
Hemoglobin: 13.2 g/dL (ref 13.0–17.0)
Immature Granulocytes: 1 %
Lymphocytes Relative: 23 %
Lymphs Abs: 2.4 10*3/uL (ref 0.7–4.0)
MCH: 26.8 pg (ref 26.0–34.0)
MCHC: 33 g/dL (ref 30.0–36.0)
MCV: 81.3 fL (ref 80.0–100.0)
Monocytes Absolute: 0.8 10*3/uL (ref 0.1–1.0)
Monocytes Relative: 8 %
Neutro Abs: 6.7 10*3/uL (ref 1.7–7.7)
Neutrophils Relative %: 64 %
Platelets: 209 10*3/uL (ref 150–400)
RBC: 4.92 MIL/uL (ref 4.22–5.81)
RDW: 15.9 % — ABNORMAL HIGH (ref 11.5–15.5)
WBC: 10.5 10*3/uL (ref 4.0–10.5)
nRBC: 0 % (ref 0.0–0.2)

## 2019-10-15 MED ORDER — IBUPROFEN 600 MG PO TABS: 600.0000 mg | ORAL_TABLET | Freq: Three times a day (TID) | ORAL | 0 refills | Status: DC | PRN

## 2019-10-15 MED ORDER — SULFAMETHOXAZOLE-TRIMETHOPRIM 400-80 MG PO TABS: 1.0000 | ORAL_TABLET | Freq: Two times a day (BID) | ORAL | 0 refills | Status: AC

## 2019-10-15 MED FILL — SULFAMETHOXAZOLE-TMP SS TAB: 400-80 | 5 days supply | Qty: 10 | Fill #0

## 2019-10-15 NOTE — TOC Transition Note (Signed)
Transition of Care [facility]) - CM/SW Discharge Note   Patient Details  Name: Jon Castro MRN: [id number] Date of Birth: 15-Sep-1985  [facility] CM/SW Contact:  Truddie Hidden, LCSW Phone Number: 10/15/2019, 1:23 PM   Clinical Narrative:    Discharged. Homeless and ETOH/illicit drug cessation resources added to AVS. No other needs at this time. Case closed to this CSW.    Final next level of care: Homeless Shelter Barriers to Discharge: Barriers Resolved   Patient Goals and CMS Choice        Discharge Placement                       Discharge Plan and Services                                     Social Determinants of Health (SDOH) Interventions     Readmission Risk Interventions No flowsheet data found.

## 2019-10-15 NOTE — Progress Notes (Signed)
Discharge paperwork and information given to pt. Pt not in distress and tolerated well. 
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
## 2019-10-15 NOTE — Plan of Care (Signed)
  Problem: Pain Managment: Goal: General experience of comfort will improve Outcome: Progressing   

## 2019-10-16 LAB — HCV RNA QUANT RFLX ULTRA OR GENOTYP
HCV RNA Qnt(log copy/mL): 6.961 log10 IU/mL
HepC Qn: 9140000 IU/mL

## 2019-10-16 LAB — HEPATITIS C GENOTYPE: Hepatitis C Genotype: 3

## 2019-10-18 LAB — CULTURE, BLOOD (ROUTINE X 2)
Culture: NO GROWTH
Culture: NO GROWTH
Special Requests: ADEQUATE
Special Requests: ADEQUATE

## 2019-10-29 ENCOUNTER — Emergency Department (HOSPITAL_COMMUNITY)
Admission: EM | Admit: 2019-10-29 | Discharge: 2019-10-30 | Disposition: A | Discharge status: Other Acute Inpatient Hospital | Payer: Self-pay | Attending: Emergency Medicine | Admitting: Emergency Medicine

## 2019-10-29 ENCOUNTER — Encounter (HOSPITAL_COMMUNITY): Payer: Self-pay | Admitting: Emergency Medicine

## 2019-10-29 ENCOUNTER — Other Ambulatory Visit: Payer: Self-pay

## 2019-10-29 DIAGNOSIS — Z20822 Contact with and (suspected) exposure to covid-19: Secondary | ICD-10-CM | POA: Insufficient documentation

## 2019-10-29 DIAGNOSIS — F332 Major depressive disorder, recurrent severe without psychotic features: Secondary | ICD-10-CM | POA: Insufficient documentation

## 2019-10-29 DIAGNOSIS — F17210 Nicotine dependence, cigarettes, uncomplicated: Secondary | ICD-10-CM | POA: Insufficient documentation

## 2019-10-29 DIAGNOSIS — F1910 Other psychoactive substance abuse, uncomplicated: Secondary | ICD-10-CM | POA: Insufficient documentation

## 2019-10-29 DIAGNOSIS — R45851 Suicidal ideations: Secondary | ICD-10-CM | POA: Insufficient documentation

## 2019-10-29 NOTE — ED Triage Notes (Signed)
Patient reports suicidal ideation plans to over dose on drugs , patient also requesting detox for his Heroin/meth addiction . Denies hallucinations .

## 2019-10-30 ENCOUNTER — Encounter (HOSPITAL_COMMUNITY): Payer: Self-pay | Admitting: Behavioral Health

## 2019-10-30 ENCOUNTER — Inpatient Hospital Stay (HOSPITAL_COMMUNITY)
Admission: AD | Admit: 2019-10-30 | Discharge: 2019-11-03 | DRG: 885 | Disposition: A | Discharge status: Home / Self Care | Payer: Federal, State, Local not specified - Other | Source: Intra-hospital | Attending: Psychiatry | Admitting: Psychiatry

## 2019-10-30 DIAGNOSIS — F17210 Nicotine dependence, cigarettes, uncomplicated: Secondary | ICD-10-CM | POA: Diagnosis present

## 2019-10-30 DIAGNOSIS — Z590 Homelessness: Secondary | ICD-10-CM

## 2019-10-30 DIAGNOSIS — Z885 Allergy status to narcotic agent status: Secondary | ICD-10-CM | POA: Diagnosis not present

## 2019-10-30 DIAGNOSIS — F1120 Opioid dependence, uncomplicated: Secondary | ICD-10-CM | POA: Diagnosis present

## 2019-10-30 DIAGNOSIS — F1129 Opioid dependence with unspecified opioid-induced disorder: Secondary | ICD-10-CM | POA: Diagnosis present

## 2019-10-30 DIAGNOSIS — F419 Anxiety disorder, unspecified: Secondary | ICD-10-CM | POA: Diagnosis present

## 2019-10-30 DIAGNOSIS — B1920 Unspecified viral hepatitis C without hepatic coma: Secondary | ICD-10-CM | POA: Diagnosis present

## 2019-10-30 DIAGNOSIS — Z20822 Contact with and (suspected) exposure to covid-19: Secondary | ICD-10-CM | POA: Diagnosis present

## 2019-10-30 DIAGNOSIS — G8929 Other chronic pain: Secondary | ICD-10-CM | POA: Diagnosis present

## 2019-10-30 DIAGNOSIS — F1910 Other psychoactive substance abuse, uncomplicated: Secondary | ICD-10-CM | POA: Diagnosis present

## 2019-10-30 DIAGNOSIS — G4700 Insomnia, unspecified: Secondary | ICD-10-CM | POA: Diagnosis present

## 2019-10-30 DIAGNOSIS — F332 Major depressive disorder, recurrent severe without psychotic features: Principal | ICD-10-CM | POA: Diagnosis present

## 2019-10-30 DIAGNOSIS — F1124 Opioid dependence with opioid-induced mood disorder: Secondary | ICD-10-CM | POA: Diagnosis present

## 2019-10-30 DIAGNOSIS — R45851 Suicidal ideations: Secondary | ICD-10-CM | POA: Diagnosis present

## 2019-10-30 DIAGNOSIS — Z7289 Other problems related to lifestyle: Secondary | ICD-10-CM | POA: Diagnosis not present

## 2019-10-30 DIAGNOSIS — F1123 Opioid dependence with withdrawal: Secondary | ICD-10-CM | POA: Diagnosis not present

## 2019-10-30 HISTORY — DX: Suicidal ideations: R45.851

## 2019-10-30 LAB — SALICYLATE LEVEL: Salicylate Lvl: <7 mg/dL — ABNORMAL LOW (ref 7.0–30.0)

## 2019-10-30 LAB — CBC
HCT: 40.3 % (ref 39.0–52.0)
Hemoglobin: 13.2 g/dL (ref 13.0–17.0)
MCH: 27.3 pg (ref 26.0–34.0)
MCHC: 32.8 g/dL (ref 30.0–36.0)
MCV: 83.3 fL (ref 80.0–100.0)
Platelets: 285 10*3/uL (ref 150–400)
RBC: 4.84 MIL/uL (ref 4.22–5.81)
RDW: 15.3 % (ref 11.5–15.5)
WBC: 11 10*3/uL — ABNORMAL HIGH (ref 4.0–10.5)
nRBC: 0 % (ref 0.0–0.2)

## 2019-10-30 LAB — RAPID URINE DRUG SCREEN, HOSP PERFORMED
Amphetamines: POSITIVE — AB
Barbiturates: NOT DETECTED
Benzodiazepines: NOT DETECTED
Cocaine: NOT DETECTED
Opiates: POSITIVE — AB
Tetrahydrocannabinol: POSITIVE — AB

## 2019-10-30 LAB — COMPREHENSIVE METABOLIC PANEL
ALT: 91 U/L — ABNORMAL HIGH (ref 0–44)
AST: 67 U/L — ABNORMAL HIGH (ref 15–41)
Albumin: 3.6 g/dL (ref 3.5–5.0)
Alkaline Phosphatase: 77 U/L (ref 38–126)
Anion gap: 10 (ref 5–15)
BUN: 10 mg/dL (ref 6–20)
CO2: 27 mmol/L (ref 22–32)
Calcium: 9.5 mg/dL (ref 8.9–10.3)
Chloride: 105 mmol/L (ref 98–111)
Creatinine, Ser: 1.11 mg/dL (ref 0.61–1.24)
GFR calc Af Amer: >60 mL/min (ref >60)
GFR calc non Af Amer: >60 mL/min (ref >60)
Glucose, Bld: 93 mg/dL (ref 70–99)
Potassium: 3.6 mmol/L (ref 3.5–5.1)
Sodium: 142 mmol/L (ref 135–145)
Total Bilirubin: 0.6 mg/dL (ref 0.3–1.2)
Total Protein: 7.6 g/dL (ref 6.5–8.1)

## 2019-10-30 LAB — SARS CORONAVIRUS 2 (TAT 6-24 HRS): SARS Coronavirus 2: NEGATIVE

## 2019-10-30 LAB — ACETAMINOPHEN LEVEL: Acetaminophen (Tylenol), Serum: <10 ug/mL — ABNORMAL LOW (ref 10–30)

## 2019-10-30 LAB — ETHANOL: Alcohol, Ethyl (B): <10 mg/dL

## 2019-10-30 MED ORDER — HYDROXYZINE HCL 25 MG PO TABS: 25.0000 mg | ORAL_TABLET | Freq: Four times a day (QID) | ORAL | Status: DC | PRN

## 2019-10-30 MED ORDER — CLONIDINE HCL 0.1 MG PO TABS
0.1000 mg | ORAL_TABLET | Freq: Every day | ORAL | Status: DC
  Administered 2019-11-03: 0.1 mg via ORAL
  Filled 2019-10-30 (×2): qty 1

## 2019-10-30 MED ORDER — NICOTINE 14 MG/24HR TD PT24: 14.0000 mg | MEDICATED_PATCH | Freq: Every day | TRANSDERMAL | Status: DC

## 2019-10-30 MED ORDER — METHOCARBAMOL 500 MG PO TABS
500.0000 mg | ORAL_TABLET | Freq: Three times a day (TID) | ORAL | Status: DC | PRN
  Administered 2019-10-31 – 2019-11-02 (×2): 500 mg via ORAL
  Filled 2019-10-30 (×2): qty 1

## 2019-10-30 MED ORDER — MAGNESIUM HYDROXIDE 400 MG/5ML PO SUSP: 30.0000 mL | Freq: Every day | ORAL | Status: DC | PRN

## 2019-10-30 MED ORDER — ALUM & MAG HYDROXIDE-SIMETH 200-200-20 MG/5ML PO SUSP
30.0000 mL | ORAL | Status: DC | PRN
  Administered 2019-11-01: 30 mL via ORAL

## 2019-10-30 MED ORDER — NAPROXEN 500 MG PO TABS
500.0000 mg | ORAL_TABLET | Freq: Two times a day (BID) | ORAL | Status: DC | PRN
  Administered 2019-10-31: 500 mg via ORAL
  Filled 2019-10-30: qty 1

## 2019-10-30 MED ORDER — ONDANSETRON 4 MG PO TBDP: 8.0000 mg | ORAL_TABLET | Freq: Three times a day (TID) | ORAL | Status: DC | PRN

## 2019-10-30 MED ORDER — TRAZODONE HCL 50 MG PO TABS: 50.0000 mg | ORAL_TABLET | Freq: Every evening | ORAL | Status: DC | PRN

## 2019-10-30 MED ORDER — NICOTINE 14 MG/24HR TD PT24
14.0000 mg | MEDICATED_PATCH | Freq: Every day | TRANSDERMAL | Status: DC
  Administered 2019-11-01 – 2019-11-03 (×3): 14 mg via TRANSDERMAL
  Filled 2019-10-30 (×8): qty 1

## 2019-10-30 MED ORDER — CLONIDINE HCL 0.1 MG PO TABS
0.1000 mg | ORAL_TABLET | ORAL | Status: AC
  Administered 2019-11-01 – 2019-11-02 (×4): 0.1 mg via ORAL
  Filled 2019-10-30 (×5): qty 1

## 2019-10-30 MED ORDER — LOPERAMIDE HCL 2 MG PO CAPS: 2.0000 mg | ORAL_CAPSULE | ORAL | Status: DC | PRN

## 2019-10-30 MED ORDER — LORAZEPAM 1 MG PO TABS
1.0000 mg | ORAL_TABLET | Freq: Four times a day (QID) | ORAL | Status: DC | PRN
  Administered 2019-10-30: 1 mg via ORAL
  Filled 2019-10-30: qty 1

## 2019-10-30 MED ORDER — ACETAMINOPHEN 325 MG PO TABS: 650.0000 mg | ORAL_TABLET | Freq: Four times a day (QID) | ORAL | Status: DC | PRN

## 2019-10-30 MED ORDER — DICYCLOMINE HCL 20 MG PO TABS
20.0000 mg | ORAL_TABLET | Freq: Four times a day (QID) | ORAL | Status: DC | PRN
  Administered 2019-10-31: 18:00:00 20 mg via ORAL
  Filled 2019-10-30: qty 1

## 2019-10-30 MED ORDER — CLONIDINE HCL 0.1 MG PO TABS
0.1000 mg | ORAL_TABLET | Freq: Four times a day (QID) | ORAL | Status: AC
  Administered 2019-10-30 – 2019-10-31 (×3): 0.1 mg via ORAL
  Filled 2019-10-30 (×8): qty 1

## 2019-10-30 MED ORDER — ONDANSETRON 4 MG PO TBDP: 4.0000 mg | ORAL_TABLET | Freq: Four times a day (QID) | ORAL | Status: DC | PRN

## 2019-10-30 NOTE — ED Notes (Signed)
TTS in proces 
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
## 2019-10-30 NOTE — Progress Notes (Signed)
Psychoeducational Group Note  Date:  10/30/2019 Time:  2030  Group Topic/Focus:  wrap up group  Participation Level: Did Not Attend  Participation Quality:  Not Applicable  Affect:  Not Applicable  Cognitive:  Not Applicable  Insight:  Not Applicable  Engagement in Group: Not Applicable  Additional Comments:  Pt remained in bed during group time.   Marcille Buffy 10/30/2019, 9:40 PM

## 2019-10-30 NOTE — BH Assessment (Signed)
Tele Assessment Note   Patient Name: Jon Castro MRN: [id number] Referring Physician: Dr. Zadie Rhine Location of Patient: [facility] Location of Provider: Behavioral Health TTS Department  Jon Castro is an 34 y.o. male presenting voluntarily due to [facility] with attempted overdose on heroin earlier today and opiate detox. Patient reported onset SI was 1 week ago. When asked about stressors/triggers, patient stated "I am tired of everything, everything is going wrong". Patient reported using heroin daily and needing detox. When asked are you able to contract for safety, patient stated "no". Patient reported worsening depressive symptoms, isolation, fatigue, guilt, insomnia, lost of interest, anxiety and feelings of worthlessness. Patient was inpatient for mental health treatment 1x in 2020. Patient denied past suicide attempts and current self-harming behaviors. Patient is currently living with friends and stated his uncle shot himself in front of his wife 1 year ago. Patient reported 3-4 hours sleep nightly and normal appetite. Patient denied HI and psychosis. Patient was calm and cooperative.   Diagnosis: Major depressive disorder and Opiate abuse  Past Medical History:  Past Medical History:  Diagnosis Date  . Back pain   . Drug abuse (HCC)   . IV drug abuse (HCC)   . IVDU (intravenous drug user)   . LFTs abnormal     Past Surgical History:  Procedure Laterality Date  . I & D EXTREMITY Right 04/28/2019   Procedure: IRRIGATION AND DEBRIDEMENT ARM ABSCESS;  Surgeon: Sheral Apley, MD;  [facility];  Service: Orthopedics;  Laterality: Right;  . TEE WITHOUT CARDIOVERSION N/A 05/03/2019   Procedure: TRANSESOPHAGEAL ECHOCARDIOGRAM (TEE);  Surgeon: Jake Bathe, MD;  Location: [facility] ENDOSCOPY;  Service: Cardiovascular;  Laterality: N/A;    Family History: No family history on file.  Social History:  reports that he has been smoking. He uses smokeless tobacco. He reports  current alcohol use. He reports current drug use. Drugs: IV, Cocaine, Amphetamines, Marijuana, Heroin, and Methamphetamines.  Additional Social History:  Alcohol / Drug Use Pain Medications: see MAR Prescriptions: see MAR Over the Counter: see MAR  CIWA: CIWA-Ar BP: 115/85 Pulse Rate: 97 COWS:    Allergies:  Allergies  Allergen Reactions  . Vicodin [Hydrocodone-Acetaminophen] Itching  . Codeine Nausea And Vomiting  . Codeine   . Tramadol Other (See Comments)    "messes with head"  . Tramadol   . Vicodin [Hydrocodone-Acetaminophen]     Home Medications: (Not in a hospital admission)   OB/GYN Status:  No LMP for male patient.  General Assessment Data Location of Assessment: [facility] ED TTS Assessment: In system Is this a Tele or Face-to-Face Assessment?: Tele Assessment Is this an Initial Assessment or a Re-assessment for this encounter?: Initial Assessment Patient Accompanied by:: N/A Language Other than English: No Living Arrangements: Homeless/Shelter(tent) What gender do you identify as?: Male Marital status: Single Living Arrangements: Alone Can pt return to current living arrangement?: Yes Admission Status: Voluntary Is patient capable of signing voluntary admission?: Yes Referral Source: Self/Family/Friend     Crisis Care Plan Living Arrangements: Alone Legal Guardian: Other:(self) Name of Psychiatrist: (none) Name of Therapist: (none)  Education Status Is patient currently in school?: No Is the patient employed, unemployed or receiving disability?: Unemployed  Risk to self with the past 6 months Suicidal Ideation: Yes-Currently Present Has patient been a risk to self within the past 6 months prior to admission? : Yes Suicidal Intent: Yes-Currently Present Has patient had any suicidal intent within the past 6 months prior to admission? : Yes  Is patient at risk for suicide?: Yes Suicidal Plan?: Yes-Currently Present Has patient had any suicidal plan  within the past 6 months prior to admission? : Yes Specify Current Suicidal Plan: (attempted overdose on pills yesterday) Access to Means: Yes Specify Access to Suicidal Means: (attempted overdose on pills on yesterday) What has been your use of drugs/alcohol within the last 12 months?: (heroin) Previous Attempts/Gestures: Yes How many times?: (2x) Other Self Harm Risks: (heroin addiction) Triggers for Past Attempts: Unpredictable Intentional Self Injurious Behavior: None Family Suicide History: No Recent stressful life event(s): Other (Comment)(drug addiction) Persecutory voices/beliefs?: No Depression: Yes Depression Symptoms: Insomnia, Tearfulness, Isolating, Fatigue, Guilt, Loss of interest in usual pleasures, Feeling worthless/self pity Substance abuse history and/or treatment for substance abuse?: No Suicide prevention information given to non-admitted patients: Not applicable  Risk to Others within the past 6 months Homicidal Ideation: No Does patient have any lifetime risk of violence toward others beyond the six months prior to admission? : No Thoughts of Harm to Others: No Current Homicidal Intent: No Current Homicidal Plan: No Access to Homicidal Means: No Identified Victim: (n/a) History of harm to others?: No Assessment of Violence: None Noted Violent Behavior Description: (n/a) Does patient have access to weapons?: No Criminal Charges Pending?: No Does patient have a court date: No Is patient on probation?: Yes  Psychosis Hallucinations: None noted Delusions: None noted  Mental Status Report Appearance/Hygiene: Unremarkable Eye Contact: Fair Motor Activity: Unremarkable Speech: Logical/coherent Level of Consciousness: Alert Mood: Depressed Affect: Appropriate to circumstance, Depressed Anxiety Level: Minimal Thought Processes: Coherent, Relevant Judgement: Impaired Orientation: Person, Place, Time, Situation Obsessive Compulsive Thoughts/Behaviors:  None  Cognitive Functioning Concentration: Fair Memory: Recent Intact Is patient IDD: No Insight: Fair Impulse Control: Poor Appetite: Fair Have you had any weight changes? : No Change Sleep: No Change Total Hours of Sleep: (3-4) Vegetative Symptoms: None  ADLScreening [facility] Assessment Services) Patient's cognitive ability adequate to safely complete daily activities?: Yes Patient able to express need for assistance with ADLs?: Yes Independently performs ADLs?: Yes (appropriate for developmental age)  Prior Inpatient Therapy Prior Inpatient Therapy: Yes Prior Therapy Dates: (2020 and 2019) Prior Therapy Facilty/Provider(s): ([facility]) Reason for Treatment: (mental illness)  Prior Outpatient Therapy Prior Outpatient Therapy: Yes Prior Therapy Dates: (unknown) Prior Therapy Facilty/Provider(s): (unknown) Reason for Treatment: (mental illness and substance abuse) Does patient have an ACCT team?: No Does patient have Intensive In-House Services?  : No Does patient have [facility] services? : No Does patient have P4CC services?: No  ADL Screening (condition at time of admission) Patient's cognitive ability adequate to safely complete daily activities?: Yes Patient able to express need for assistance with ADLs?: Yes Independently performs ADLs?: Yes (appropriate for developmental age) Regulatory affairs officer (For Healthcare) Does Patient Have a Medical Advance Directive?: No Would patient like information on creating a medical advance directive?: No - Patient declined  Disposition:  Disposition Initial Assessment Completed for this Encounter: Yes  Adaku Anike, NP, patient meets inpatient criteria. TTS to secure employment.   This service was provided via telemedicine using a 2-way, interactive audio and video technology.  Names of all persons participating in this telemedicine service and their role in this encounter. Name: Jon Castro Role: Patient  Name: Kirtland Bouchard Role: TTS Clinician  Name:  Role:   Name: Role:     Venora Maples 10/30/2019 5:04 AM

## 2019-10-30 NOTE — ED Provider Notes (Signed)
[facility] EMERGENCY DEPARTMENT Provider Note   CSN: [id number] Arrival date & time: 10/29/19  2347     History Chief Complaint  Patient presents with  . Suicidal  . Drug Problem    Jon Castro is a 34 y.o. male.  The history is provided by the patient.  Drug Problem This is a chronic problem. The current episode started more than 1 week ago. The problem occurs daily. The problem has been gradually worsening. Pertinent negatives include no chest pain and no headaches. Nothing aggravates the symptoms. Nothing relieves the symptoms.  Patient with history of chronic back pain, polysubstance abuse presents with depression and suicidal thoughts.  He reports he considered overdosing on heroin earlier in the day.  He reports heroin is his drug of choice.  He reports feeling depressed. He has no other acute complaints     Past Medical History:  Diagnosis Date  . Back pain   . Drug abuse ([place])   . IV drug abuse ([place])   . IVDU (intravenous drug user)   . LFTs abnormal     Patient Active Problem List   Diagnosis Date Noted  . Cellulitis 10/13/2019  . Overdose of heroin, accidental or unintentional, initial encounter ([place]) 05/31/2019  . Fungemia 05/02/2019  . Blood culture positive for Candida species 05/02/2019  . MRSA bacteremia 04/29/2019  . IVDU (intravenous drug user) 04/29/2019  . Abscess of right forearm 04/28/2019  . Hyperglycemia 04/28/2019  . Leucocytosis 04/28/2019  . LFTs abnormal 04/28/2019  . Opioid dependence with opioid-induced mood disorder ([place])   . MDD (major depressive disorder), severe ([place]) 02/07/2019    Past Surgical History:  Procedure Laterality Date  . I & D EXTREMITY Right 04/28/2019   Procedure: IRRIGATION AND DEBRIDEMENT ARM ABSCESS;  Surgeon: Renette Butters, MD;  Location: [facility];  Service: Orthopedics;  Laterality: Right;  . TEE WITHOUT CARDIOVERSION N/A 05/03/2019   Procedure: TRANSESOPHAGEAL ECHOCARDIOGRAM (TEE);   Surgeon: Jerline Pain, MD;  Location: [facility] ENDOSCOPY;  Service: Cardiovascular;  Laterality: N/A;       No family history on file.  Social History   Tobacco Use  . Smoking status: Current Every Day Smoker  . Smokeless tobacco: Current User  Substance Use Topics  . Alcohol use: Yes  . Drug use: Yes    Types: IV, Cocaine, Amphetamines, Marijuana, Heroin, Methamphetamines    Home Medications Prior to Admission medications   Medication Sig Start Date End Date Taking? Authorizing Provider  cloNIDine (CATAPRES) 0.1 MG tablet Take 1 tablet (0.1 mg total) by mouth 3 (three) times daily for 5 days. Patient not taking: Reported on 03/24/2019 03/08/19 03/24/19  Lennice Sites, DO  traZODone (DESYREL) 50 MG tablet Take 1 tablet (50 mg total) by mouth at bedtime as needed for sleep. Patient not taking: Reported on 02/21/2019 02/11/19 03/24/19  Connye Burkitt, NP    Allergies    Vicodin [hydrocodone-acetaminophen], Codeine, Codeine, Tramadol, Tramadol, and Vicodin [hydrocodone-acetaminophen]  Review of Systems   Review of Systems  Constitutional: Negative for fever.  Cardiovascular: Negative for chest pain.  Gastrointestinal: Negative for diarrhea and vomiting.  Neurological: Negative for headaches.  Psychiatric/Behavioral: Positive for suicidal ideas.  All other systems reviewed and are negative.   Physical Exam Updated Vital Signs BP 115/85 (BP Location: Right Arm)   Pulse 97   Temp (!) 97.5 F (36.4 C) (Oral)   Resp 16   Ht 1.854 m (6\' 1" )   Wt 85 kg  SpO2 94%   BMI 24.72 kg/m   Physical Exam CONSTITUTIONAL: Disheveled, no acute distress HEAD: Normocephalic/atraumatic EYES: EOMI ENMT: Mucous membranes moist NECK: supple no meningeal signs SPINE/BACK:entire spine nontender CV: S1/S2 noted, no murmurs/rubs/gallops noted LUNGS: Lungs are clear to auscultation bilaterally, no apparent distress ABDOMEN: soft, nontender NEURO: Pt is awake/alert/appropriate, moves all  extremitiesx4.  No facial droop.   EXTREMITIES: pulses normal/equal, full ROM, track marks noted to bilateral forearms, no abscess or cellulitis. SKIN: warm, color normal PSYCH: no abnormalities of mood noted, alert and oriented to situation  ED Results / Procedures / Treatments   Labs (all labs ordered are listed, but only abnormal results are displayed) Labs Reviewed  COMPREHENSIVE METABOLIC PANEL - Abnormal; Notable for the following components:      Result Value   AST 67 (*)    ALT 91 (*)    All other components within normal limits  SALICYLATE LEVEL - Abnormal; Notable for the following components:   Salicylate Lvl <7.0 (*)    All other components within normal limits  ACETAMINOPHEN LEVEL - Abnormal; Notable for the following components:   Acetaminophen (Tylenol), Serum <10 (*)    All other components within normal limits  CBC - Abnormal; Notable for the following components:   WBC 11.0 (*)    All other components within normal limits  RAPID URINE DRUG SCREEN, HOSP PERFORMED - Abnormal; Notable for the following components:   Opiates POSITIVE (*)    Amphetamines POSITIVE (*)    Tetrahydrocannabinol POSITIVE (*)    All other components within normal limits  SARS CORONAVIRUS 2 (TAT 6-24 HRS)  ETHANOL    EKG None  Radiology No results found.  Procedures Procedures  Medications Ordered in ED Medications  LORazepam (ATIVAN) tablet 1 mg (1 mg Oral Given 10/30/19 0241)  ondansetron (ZOFRAN-ODT) disintegrating tablet 8 mg (has no administration in time range)    ED Course  I have reviewed the triage vital signs and the nursing notes.  Pertinent labs results that were available during my care of the patient were reviewed by me and considered in my medical decision making (see chart for details).    MDM Rules/Calculators/A&P                     2:44 AM Patient medically stable at this time.  Will consult psychiatry.  Patient is currently here voluntarily  The  patient has been placed in psychiatric observation due to the need to provide a safe environment for the patient while obtaining psychiatric consultation and evaluation, as well as ongoing medical and medication management to treat the patient's condition.  The patient has not been placed under full IVC at this time.  Final Clinical Impression(s) / ED Diagnoses Final diagnoses:  Polysubstance abuse (HCC)  Suicidal ideation    Rx / DC Orders ED Discharge Orders    None       Zadie Rhine, MD 10/30/19 [phone]

## 2019-10-30 NOTE — Progress Notes (Signed)
D:  Patient denied SI and HI, contracts for safety.  Denied A/V hallucinations.  Denied pain. A:  Medications administered per MD orders.  Emotional support and encouragement given patient. R:  Safety maintained with 15 minute checks.  

## 2019-10-30 NOTE — ED Notes (Signed)
Patient transferred to [facility] with Safe Transport. Belongings given to safe transport. Patient verbalizes understanding of admission to [facility].

## 2019-10-30 NOTE — BHH Suicide Risk Assessment (Signed)
[facility] Admission Suicide Risk Assessment   Nursing information obtained from:   Patient in chart Demographic factors:   71, homeless, reports he has been staying with friends Current Mental Status:   See below Loss Factors:   Substance abuse Historical Factors:   History of opiate use disorder Risk Reduction Factors:   Resilience  Total Time spent with patient: 45 minutes Principal Problem: Opioid dependence with opioid-induced mood disorder (HCC) Diagnosis:  Principal Problem:   Opioid dependence with opioid-induced mood disorder ([place]) Active Problems:   MDD (major depressive disorder), recurrent severe, without psychosis ([place])   Opioid use disorder, severe, dependence ([place])   Suicidal ideation  Subjective Data:   Continued Clinical Symptoms:    The "Alcohol Use Disorders Identification Test", Guidelines for Use in Primary Care, Second Edition.  World Pharmacologist [facility]). Score between 0-7:  no or low risk or alcohol related problems. Score between 8-15:  moderate risk of alcohol related problems. Score between 16-19:  high risk of alcohol related problems. Score 20 or above:  warrants further diagnostic evaluation for alcohol dependence and treatment.   CLINICAL FACTORS:  34 year old male, currently homeless, reports he has been staying with friends He presented to ED yesterday expressing suicidal thoughts of overdosing and requesting assistance for substance abuse. Today patient presents alert, attentive, irritable/dysphoric and limited historian at this time, answering most questions with monosyllables or short phrases. He reports he has been using heroin (IVDA) daily "for a long time".  He reports she has been feeling more depressed recently and endorses recent suicidal thoughts with thoughts of overdosing.  Endorses neurovegetative symptoms to include anhedonia, poor sleep, poor appetite, low energy level.  Denies hallucinations and no psychotic symptoms are  noted. Admission BAL negative, UDS positive for amphetamines, opiates, cannabis. Patient is known to [facility] from past psychiatric admission in June 2020, at which time presented for depression/opiate (heroin) use disorder.  At that time was discharged on Vistaril and Trazodone PRN.  Currently denies past history of suicide attempts, denies history of psychosis, no history of mania or hypomania. Reports long history of substance abuse, identifies opiates/heroin as substance of choice. Currently denies medical illnesses.  Vicodin, tramadol, codeine are listed as allergies.  He was not taking any prescribed medications prior to admission. Labs reviewed-BMP unremarkable, AST 67, ALT 91 ( Hep C (+).  WBC 11, hemoglobin 13.2, platelet 285K.  Serum glucose 93 Diagnosis-opiate use disorder, consider amphetamine use disorder, substance-induced mood disorder versus MDD Plan-inpatient admission.  Start opiate detox protocol with clonidine and withdrawal symptoms symptomatic treatment. Trazodone 50 mg nightly as needed for insomnia.   Musculoskeletal: Strength & Muscle Tone: within normal limits Gait & Station: normal Patient leans: N/A  Psychiatric Specialty Exam: Physical Exam  Review of Systems denies chest pain, no shortness of breath at room air, no cough.  Currently denies vomiting, nausea, diarrhea or significant aches/pains and states "I do not think that withdrawal has "even started yet"  There were no vitals taken for this visit.There is no height or weight on file to calculate BMI.  General Appearance: Fairly Groomed  Eye Contact:  Fair  Speech:  Normal Rate  Volume:  Decreased  Mood:  Dysphoric and Irritable  Affect:  Congruent  Thought Process:  Linear and Descriptions of Associations: Intact  Orientation:  Other:  Fully alert and attentive  Thought Content:  Denies hallucinations, no delusions expressed  Suicidal Thoughts:  No currently denies suicidal or self-injurious ideations,  contracts for safety on unit  Homicidal Thoughts:  No  Memory:  Recent and remote fair  Judgement:  Fair  Insight:  Fair  Psychomotor Activity:  Some restlessness.  No tremors, no diaphoresis  Concentration:  Concentration: Fair and Attention Span: Fair  Recall:  [employer] of Knowledge:  Fair  Language:  Fair  Akathisia:  Negative  Handed:  Right  AIMS (if indicated):     Assets:  Communication Skills Desire for Improvement Resilience  ADL's:  Intact  Cognition:  WNL  Sleep:         COGNITIVE FEATURES THAT CONTRIBUTE TO RISK:  Closed-mindedness and Loss of executive function    SUICIDE RISK:   Moderate:  Frequent suicidal ideation with limited intensity, and duration, some specificity in terms of plans, no associated intent, good self-control, limited dysphoria/symptomatology, some risk factors present, and identifiable protective factors, including available and accessible social support.  PLAN OF CARE: Patient will be admitted to inpatient psychiatric unit for stabilization and safety. Will provide and encourage milieu participation. Provide medication management and maked adjustments as needed.  We will also provide medication management to address withdrawal symptoms as needed- will follow daily.    I certify that inpatient services furnished can reasonably be expected to improve the patient's condition.   Craige Cotta, MD 10/30/2019, 2:15 PM

## 2019-10-30 NOTE — Tx Team (Signed)
Initial Treatment Plan 10/30/2019 6:48 PM Jon Castro BJS:[id number]    PATIENT STRESSORS: Substance abuse   PATIENT STRENGTHS: Communication skills Motivation for treatment/growth   PATIENT IDENTIFIED PROBLEMS: Patient is detoxing from heroin  Patient is complains of depression                    DISCHARGE CRITERIA:  Improved stabilization in mood, thinking, and/or behavior Withdrawal symptoms are absent or subacute and managed without 24-hour nursing intervention  PRELIMINARY DISCHARGE PLAN: Return to previous living arrangement  PATIENT/FAMILY INVOLVEMENT: This treatment plan has been presented to and reviewed with the patient, Jon Castro, and/or family member.  The patient and family have been given the opportunity to ask questions and make suggestions.  Jerrye Bushy, RN 10/30/2019, 6:48 PM

## 2019-10-30 NOTE — Progress Notes (Signed)
Pt accepted to [facility]; 304-2  Dr. Jama Flavors is the attending provider.    Call report to [phone]    [facility] ED notified.     Pt is voluntary and will be transported by [employer]  Pt may arrive to [facility] as soon as transportation is arranged.   Wells Guiles, LCSW, LCAS Disposition CSW [facility] BHH/TTS [phone]

## 2019-10-30 NOTE — Progress Notes (Signed)
Patient ID: Jon Castro, male   DOB: 22-Feb-1986, 34 y.o.   MRN: [id number] Patient admitted to the unit due to increased depression and passive suicidal ideations.  Patient states that he has been using heroin and desires to get clean. Skin assessment complete and patient found to be free of injury and harm.

## 2019-10-30 NOTE — H&P (Addendum)
Psychiatric Admission Assessment Adult  Patient Identification: Jon Castro   MRN:  [id number]   Date of Evaluation:  10/30/2019   Chief Complaint:  MDD (major depressive disorder), recurrent severe, without psychosis ([place]) [F33.2]   Principal Diagnosis: Opioid dependence with opioid-induced mood disorder ([place])   Diagnosis:  Principal Problem:   Opioid dependence with opioid-induced mood disorder ([place]) Active Problems:   MDD (major depressive disorder), recurrent severe, without psychosis ([place])   Opioid use disorder, severe, dependence ([place])   Suicidal ideation  History of Present Illness: Jon Castro is a 34 y.o male who presented to the emergency department on 10/29/2019 with worsening depression and suicidal thoughts with a plan to overdose on heroin. Patient reports that he has been feeling suicidal for approximately 1 week.He endorses depression, fatigue, anhedonia, worthlessness, excessive worry, and insomnia. He rates depression as 8 out of 10 and anxiety as 8 out of 10 with 10 being the most severe. Patient reports that he uses heroin daily. Started using heroin about 5-6 years ago. He denies a history of accidental and intentional heroin overdose. However, according to chart review he was seen in the [facility] in 12/2014 and [facility] in 02/2019 for accidental heroin overdoses. He denies use of other substances. Urine drug screen is positive for opiates, THC, and amphetamines. Reports history of one inpatient admission in July 2020. Reports residential substance abuse treatment at [facility]. He does not recall the year. He denies current suicidal thoughts. He is able to verbally contract for safety while in the hospital. He denies homicidal ideations. He denies auditory and visual hallucinations. He denies any legal issues. States that he lives with a friend.   Associated Signs/Symptoms:  Depression Symptoms:  depressed mood, anhedonia, insomnia, psychomotor agitation, feelings of  worthlessness/guilt, difficulty concentrating, hopelessness, suicidal thoughts with specific plan, anxiety,   (Hypo) Manic Symptoms:  Impulsivity, Irritable Mood,   Anxiety Symptoms:  Excessive Worry,   Psychotic Symptoms:  Denies, none noted   PTSD Symptoms: Denies history of verbal, physical, and sexual abuse   Total Time spent with patient: 1 hour  Past Psychiatric History: MDD, Opiate induced mood disorder with use disorder. Last inpatient admission at [facility] June 2020. Substance abuse treatment at [facility].  Is the patient at risk to self? Yes.    Has the patient been a risk to self in the past 6 months? No.  Has the patient been a risk to self within the distant past? Yes.    Is the patient a risk to others? No.  Has the patient been a risk to others in the past 6 months? No.  Has the patient been a risk to others within the distant past? No.   Prior Inpatient Therapy:  Yes Prior Outpatient Therapy:  Yes  Alcohol Screening:     Substance Abuse History in the last 12 months:  Yes.  Heroin, Marijuana, Methamphetamines   Consequences of Substance Abuse: Medical Consequences:  Hepatitis C, Hx of Cellulitis/Abcess related to IV drug use. Family Consequences:  Family discord   Previous Psychotropic Medications: Yes    Psychological Evaluations: No    Past Medical History:  Past Medical History:  Diagnosis Date  . Back pain   . Drug abuse ([place])   . IV drug abuse ([place])   . IVDU (intravenous drug user)   . LFTs abnormal     Past Surgical History:  Procedure Laterality Date  . I & D EXTREMITY Right 04/28/2019   Procedure: IRRIGATION AND DEBRIDEMENT ARM ABSCESS;  Surgeon: Renette Butters, MD;  Location: [place];  Service: Orthopedics;  Laterality: Right;  . TEE WITHOUT CARDIOVERSION N/A 05/03/2019   Procedure: TRANSESOPHAGEAL ECHOCARDIOGRAM (TEE);  Surgeon: Jerline Pain, MD;  Location: [facility] ENDOSCOPY;  Service: Cardiovascular;  Laterality: N/A;   Family History: No family  history on file.   Family Psychiatric  History: Denies family psychiatric history  Tobacco Screening:     Social History:  Social History   Substance and Sexual Activity  Alcohol Use Yes     Social History   Substance and Sexual Activity  Drug Use Yes  . Types: IV, Cocaine, Amphetamines, Marijuana, Heroin, Methamphetamines    Additional Social History:      Allergies:   Allergies  Allergen Reactions  . Vicodin [Hydrocodone-Acetaminophen] Itching  . Codeine Nausea And Vomiting  . Codeine   . Tramadol Other (See Comments)    "messes with head"  . Tramadol   . Vicodin [Hydrocodone-Acetaminophen]    Lab Results:  Results for orders placed or performed during the hospital encounter of 10/29/19 (from the past 48 hour(s))  Comprehensive metabolic panel     Status: Abnormal   Collection Time: 10/30/19 12:01 AM  Result Value Ref Range   Sodium 142 135 - 145 mmol/L   Potassium 3.6 3.5 - 5.1 mmol/L   Chloride 105 98 - 111 mmol/L   CO2 27 22 - 32 mmol/L   Glucose, Bld 93 70 - 99 mg/dL    Comment: Glucose reference range applies only to samples taken after fasting for at least 8 hours.   BUN 10 6 - 20 mg/dL   Creatinine, Ser 1.11 0.61 - 1.24 mg/dL   Calcium 9.5 8.9 - 10.3 mg/dL   Total Protein 7.6 6.5 - 8.1 g/dL   Albumin 3.6 3.5 - 5.0 g/dL   AST 67 (H) 15 - 41 U/L   ALT 91 (H) 0 - 44 U/L   Alkaline Phosphatase 77 38 - 126 U/L   Total Bilirubin 0.6 0.3 - 1.2 mg/dL   GFR calc non Af Amer >60 >60 mL/min   GFR calc Af Amer >60 >60 mL/min   Anion gap 10 5 - 15    Comment: Performed at [facility], [place] [address], [place], [place] [address]  cbc     Status: Abnormal   Collection Time: 10/30/19 12:01 AM  Result Value Ref Range   WBC 11.0 (H) 4.0 - 10.5 K/uL   RBC 4.84 4.22 - 5.81 MIL/uL   Hemoglobin 13.2 13.0 - 17.0 g/dL   HCT 40.3 39.0 - 52.0 %   MCV 83.3 80.0 - 100.0 fL   MCH 27.3 26.0 - 34.0 pg   MCHC 32.8 30.0 - 36.0 g/dL   RDW 15.3 11.5 - 15.5 %   Platelets  285 150 - 400 K/uL   nRBC 0.0 0.0 - 0.2 %    Comment: Performed at [facility], [place] [address], [place], [place] [address]  Ethanol     Status: None   Collection Time: 10/30/19 12:06 AM  Result Value Ref Range   Alcohol, Ethyl (B) <10 <10 mg/dL    Comment: (NOTE) Lowest detectable limit for serum alcohol is 10 mg/dL. For medical purposes only. Performed at [facility], [place] [address], [place], [place] [address]   Salicylate level     Status: Abnormal   Collection Time: 10/30/19 12:06 AM  Result Value Ref Range   Salicylate Lvl <7.9 (L) 7.0 - 30.0  mg/dL    Comment: Performed at [facility], [place] [address] [place], [place] [address]  Acetaminophen level     Status: Abnormal   Collection Time: 10/30/19 12:06 AM  Result Value Ref Range   Acetaminophen (Tylenol), Serum <10 (L) 10 - 30 ug/mL    Comment: (NOTE) Therapeutic concentrations vary significantly. A range of 10-30 ug/mL  may be an effective concentration for many patients. However, some  are best treated at concentrations outside of this range. Acetaminophen concentrations >150 ug/mL at 4 hours after ingestion  and >50 ug/mL at 12 hours after ingestion are often associated with  toxic reactions. Performed at [facility], [place] [address], [place], [place] [address]   Rapid urine drug screen (hospital performed)     Status: Abnormal   Collection Time: 10/30/19 12:15 AM  Result Value Ref Range   Opiates POSITIVE (A) NONE DETECTED   Cocaine NONE DETECTED NONE DETECTED   Benzodiazepines NONE DETECTED NONE DETECTED   Amphetamines POSITIVE (A) NONE DETECTED   Tetrahydrocannabinol POSITIVE (A) NONE DETECTED   Barbiturates NONE DETECTED NONE DETECTED    Comment: (NOTE) DRUG SCREEN FOR MEDICAL PURPOSES ONLY.  IF CONFIRMATION IS NEEDED FOR ANY PURPOSE, NOTIFY LAB WITHIN 5 DAYS. LOWEST DETECTABLE LIMITS FOR URINE DRUG SCREEN Drug Class                     Cutoff (ng/mL) Amphetamine and metabolites     1000 Barbiturate and metabolites    200 Benzodiazepine                 128 Tricyclics and metabolites     300 Opiates and metabolites        300 Cocaine and metabolites        300 THC                            50 Performed at [facility], [place] [address] [place], [place] [address]   SARS CORONAVIRUS 2 (TAT 6-24 HRS) Nasopharyngeal Nasopharyngeal Swab     Status: None   Collection Time: 10/30/19  2:41 AM   Specimen: Nasopharyngeal Swab  Result Value Ref Range   SARS Coronavirus 2 NEGATIVE NEGATIVE    Comment: (NOTE) SARS-CoV-2 target nucleic acids are NOT DETECTED. The SARS-CoV-2 RNA is generally detectable in upper and lower respiratory specimens during the acute phase of infection. Negative results do not preclude SARS-CoV-2 infection, do not rule out co-infections with other pathogens, and should not be used as the sole basis for treatment or other patient management decisions. Negative results must be combined with clinical observations, patient history, and epidemiological information. The expected result is Negative. Fact Sheet for Patients: SugarRoll.be Fact Sheet for Healthcare Providers: https://www.woods-mathews.com/ This test is not yet approved or cleared by the [place] FDA and  has been authorized for detection and/or diagnosis of SARS-CoV-2 by FDA under an Emergency Use Authorization (EUA). This EUA will remain  in effect (meaning this test can be used) for the duration of the COVID-19 declaration under Section 56 4(b)(1) of the Act, 21 U.S.C. section 360bbb-3(b)(1), unless the authorization is terminated or revoked sooner. Performed at [facility], [place] [address] [place], [place] [address]     Blood Alcohol level:  Lab Results  Component Value Date   [facility] <10 10/30/2019   ETH <10 70/96/2836    Metabolic Disorder Labs:  Lab Results  Component Value Date   HGBA1C 5.8 (H) 04/29/2019   MPG  119.76 04/29/2019   No results found for: PROLACTIN No results found for: CHOL, TRIG, HDL, CHOLHDL, VLDL, LDLCALC  Current Medications: Current Facility-Administered Medications  Medication Dose Route Frequency Provider Last Rate Last Admin  . acetaminophen (TYLENOL) tablet 650 mg  650 mg Oral Q6H PRN Connye Burkitt, NP      . alum & mag hydroxide-simeth (MAALOX/MYLANTA) 200-200-20 MG/5ML suspension 30 mL  30 mL Oral Q4H PRN Connye Burkitt, NP      . cloNIDine (CATAPRES) tablet 0.1 mg  0.1 mg Oral QID Connye Burkitt, NP       Followed by  . [START ON 11/01/2019] cloNIDine (CATAPRES) tablet 0.1 mg  0.1 mg Oral BH-qamhs Connye Burkitt, NP       Followed by  . [START ON 11/03/2019] cloNIDine (CATAPRES) tablet 0.1 mg  0.1 mg Oral QAC breakfast Connye Burkitt, NP      . dicyclomine (BENTYL) tablet 20 mg  20 mg Oral Q6H PRN Connye Burkitt, NP      . hydrOXYzine (ATARAX/VISTARIL) tablet 25 mg  25 mg Oral Q6H PRN Connye Burkitt, NP      . loperamide (IMODIUM) capsule 2-4 mg  2-4 mg Oral PRN Connye Burkitt, NP      . magnesium hydroxide (MILK OF MAGNESIA) suspension 30 mL  30 mL Oral Daily PRN Connye Burkitt, NP      . methocarbamol (ROBAXIN) tablet 500 mg  500 mg Oral Q8H PRN Connye Burkitt, NP      . naproxen (NAPROSYN) tablet 500 mg  500 mg Oral BID PRN Connye Burkitt, NP      . nicotine (NICODERM CQ - dosed in mg/24 hours) patch 14 mg  14 mg Transdermal Daily Connye Burkitt, NP      . ondansetron (ZOFRAN-ODT) disintegrating tablet 4 mg  4 mg Oral Q6H PRN Connye Burkitt, NP      . traZODone (DESYREL) tablet 50 mg  50 mg Oral QHS PRN Connye Burkitt, NP       PTA Medications: No medications prior to admission.    Musculoskeletal: Strength & Muscle Tone: within normal limits Gait & Station: normal Patient leans: N/A  Psychiatric Specialty Exam: Physical Exam  Nursing note and vitals reviewed. Constitutional: He is oriented to person, place, and time. He appears well-developed and  well-nourished. No distress.  HENT:  Head: Normocephalic.  Eyes: Pupils are equal, round, and reactive to light. Right eye exhibits no discharge. Left eye exhibits no discharge.  Cardiovascular: Normal rate.  Respiratory: Effort normal. No respiratory distress.  Genitourinary:    Genitourinary Comments: Deferred   Musculoskeletal:        General: Normal range of motion.  Neurological: He is alert and oriented to person, place, and time.  Skin: Skin is warm and dry. He is not diaphoretic.  Psychiatric: His mood appears anxious. He is not withdrawn and not actively hallucinating. Thought content is not paranoid and not delusional. He expresses impulsivity and inappropriate judgment. He exhibits a depressed mood. He expresses no homicidal and no suicidal ideation.    Review of Systems  Constitutional: Negative for activity change, appetite change, chills, diaphoresis, fatigue, fever and unexpected weight change.  HENT: Negative for congestion, rhinorrhea and sneezing.   Respiratory: Negative for cough and shortness of breath.   Cardiovascular: Negative for chest pain.  Gastrointestinal: Negative for constipation, diarrhea, nausea  and vomiting.  Skin: Negative.   Neurological: Negative for dizziness, tremors and headaches.  Psychiatric/Behavioral: Positive for decreased concentration, dysphoric mood, sleep disturbance and suicidal ideas. Negative for hallucinations and self-injury. The patient is nervous/anxious. The patient is not hyperactive.     There were no vitals taken for this visit.There is no height or weight on file to calculate BMI.  See psychiatrist's SRA    Treatment Plan Summary: Daily contact with patient to assess and evaluate symptoms and progress in treatment and Medication management See MD's admission SRA, treatment/recommendation & MAR.   Observation Level/Precautions:  15 minute checks  Laboratory:  Labs Reviewed: AST 67, ALT 91, UDS positive for opiates, THC, and  amphetamines. TSH, Lipids, A1C pending.  Psychotherapy:  Group  Medications:  Clonidine opiate withdraw protocol See MD's admission SRA, treatment/recommendation & MAR.  Consultations:  Social Work  Discharge Concerns:  Safety, continued substance abuse, medication adherence  Estimated LOS: 3-5 days  Other:     Physician Treatment Plan for Primary Diagnosis: Opioid dependence with opioid-induced mood disorder ([place]) Long Term Goal(s): Improvement in symptoms so as ready for discharge  Short Term Goals: Ability to identify changes in lifestyle to reduce recurrence of condition will improve, Ability to verbalize feelings will improve, Ability to disclose and discuss suicidal ideas, Ability to demonstrate self-control will improve, Ability to identify and develop effective coping behaviors will improve, Ability to maintain clinical measurements within normal limits will improve, Compliance with prescribed medications will improve and Ability to identify triggers associated with substance abuse/mental health issues will improve  Physician Treatment Plan for Secondary Diagnosis: Principal Problem:   Opioid dependence with opioid-induced mood disorder ([place]) Active Problems:   MDD (major depressive disorder), recurrent severe, without psychosis ([place])   Opioid use disorder, severe, dependence ([place])   Suicidal ideation  Long Term Goal(s): Improvement in symptoms so as ready for discharge  Short Term Goals: Ability to identify changes in lifestyle to reduce recurrence of condition will improve, Ability to verbalize feelings will improve, Ability to disclose and discuss suicidal ideas, Ability to demonstrate self-control will improve, Ability to identify and develop effective coping behaviors will improve, Ability to maintain clinical measurements within normal limits will improve, Compliance with prescribed medications will improve and Ability to identify triggers associated with substance abuse/mental  health issues will improve  I certify that inpatient services furnished can reasonably be expected to improve the patient's condition.    Rozetta Nunnery, NP 3/3/20212:16 PM   I have discussed case with NP and have met with patient  Agree with NP note and assessment  34 year old male, currently homeless, reports he has been staying with friends He presented to ED yesterday expressing suicidal thoughts of overdosing and requesting assistance for substance abuse. Today patient presents alert, attentive, irritable/dysphoric and limited historian at this time, answering most questions with monosyllables or short phrases. He reports he has been using heroin (IVDA) daily "for a long time".  He reports she has been feeling more depressed recently and endorses recent suicidal thoughts with thoughts of overdosing.  Endorses neurovegetative symptoms to include anhedonia, poor sleep, poor appetite, low energy level.  Denies hallucinations and no psychotic symptoms are noted. Admission BAL negative, UDS positive for amphetamines, opiates, cannabis. Patient is known to [facility] from past psychiatric admission in June 2020, at which time presented for depression/opiate (heroin) use disorder.  At that time was discharged on Vistaril and Trazodone PRN.  Currently denies past history of suicide attempts, denies history of psychosis,  no history of mania or hypomania. Reports long history of substance abuse, identifies opiates/heroin as substance of choice. Currently denies medical illnesses.  Vicodin, tramadol, codeine are listed as allergies.  He was not taking any prescribed medications prior to admission. Labs reviewed-BMP unremarkable, AST 67, ALT 91 ( Hep C (+).  WBC 11, hemoglobin 13.2, platelet 285K.  Serum glucose 93 Diagnosis-opiate use disorder, consider amphetamine use disorder, substance-induced mood disorder versus MDD Plan-inpatient admission.  Start opiate detox protocol with clonidine and withdrawal  symptoms symptomatic treatment. Trazodone 50 mg nightly as needed for insomnia.

## 2019-10-30 NOTE — [facility] Suicide Risk Assessment (Cosign Needed)
[facility] INPATIENT:  Family/Significant Other Suicide Prevention Education  Suicide Prevention Education:  Patient Refusal for Family/Significant Other Suicide Prevention Education: The patient Jon Castro has refused to provide written consent for family/significant other to be provided Family/Significant Other Suicide Prevention Education during admission and/or prior to discharge.  Physician notified.  Reynold Bowen 10/30/2019, 1:47 PM

## 2019-10-30 NOTE — ED Notes (Signed)
Patiens belongings in purple locker # 4

## 2019-10-30 NOTE — [facility] Group Notes (Signed)
LCSW Group Therapy Note  10/30/2019 2:21 PM  Type of Therapy/Topic:  Group Therapy:  Balance in Life  Participation Level:  Did Not Attend  Description of Group:    This group will address the concept of balance and how it feels and looks when one is unbalanced. Patients will be encouraged to process areas in their lives that are out of balance and identify reasons for remaining unbalanced. Facilitators will guide patients in utilizing problem-solving interventions to address and correct the stressor making their life unbalanced. Understanding and applying boundaries will be explored and addressed for obtaining and maintaining a balanced life. Patients will be encouraged to explore ways to assertively make their unbalanced needs known to significant others in their lives, using other group members and facilitator for support and feedback.  Therapeutic Goals: 1. Patient will identify two or more emotions or situations they have that consume much of in their lives. 2. Patient will identify signs/triggers that life has become out of balance:  3. Patient will identify two ways to set boundaries in order to achieve balance in their lives:  4. Patient will demonstrate ability to communicate their needs through discussion and/or role plays  Summary of Patient Progress: x     Therapeutic Modalities:   Cognitive Behavioral Therapy Solution-Focused Therapy Assertiveness Training  Iris Pert, MSW, LCSW Clinical Social Work 10/30/2019 2:21 PM

## 2019-10-31 MED ORDER — LORAZEPAM 0.5 MG PO TABS
0.5000 mg | ORAL_TABLET | Freq: Four times a day (QID) | ORAL | Status: DC | PRN
  Administered 2019-10-31 – 2019-11-03 (×7): 0.5 mg via ORAL
  Filled 2019-10-31 (×8): qty 1

## 2019-10-31 NOTE — Progress Notes (Signed)
   10/31/19 2300  Psych Admission Type (Psych Patients Only)  Admission Status Voluntary  Psychosocial Assessment  Patient Complaints Anxiety;Irritability  Eye Contact Avoids (head under covers)  Facial Expression Other (Comment) (pt head under the covers)  Affect Irritable  Speech Logical/coherent  Interaction Avoidant  Motor Activity Slow  Appearance/Hygiene Disheveled  Behavior Characteristics Cooperative  Mood Anxious  Thought Process  Coherency WDL  Content WDL  Delusions None reported or observed  Perception WDL  Hallucination None reported or observed  Judgment Poor  Confusion None  Danger to Self  Current suicidal ideation? Denies  Danger to Others  Danger to Others None reported or observed

## 2019-10-31 NOTE — [facility] Group Notes (Signed)
Pt did not attend wrap up group this evening. Pt was in bed resting. 

## 2019-10-31 NOTE — Progress Notes (Signed)
   10/30/19 2100  Psych Admission Type (Psych Patients Only)  Admission Status Voluntary  Psychosocial Assessment  Patient Complaints Irritability;Anxiety  Eye Contact Avoids (head under covers)  Facial Expression Other (Comment) (pt head under the covers)  Affect Irritable  Speech Logical/coherent  Interaction Avoidant  Motor Activity Slow  Behavior Characteristics Unwilling to participate;Irritable  Mood Irritable;Anxious  Thought Process  Coherency WDL  Content WDL  Delusions None reported or observed  Perception WDL  Hallucination None reported or observed  Judgment Poor  Confusion None  Danger to Self  Current suicidal ideation? Denies  Danger to Others  Danger to Others None reported or observed   Pt irritable this evening. Just wanted to lay down. Finally consented to have VS assessed. Refused meds this evening. Of the clonidine, "It doesn't help. I already had it today anyway." Pt assessed in bed with head covered up.

## 2019-10-31 NOTE — Progress Notes (Signed)
[facility] MD Progress Note  10/31/2019 12:42 PM Jon Castro  MRN:  [id number] Subjective: Patient states "I am okay".  Currently denies medication side effects.  Denies suicidal ideations. Objective: I discussed case with treatment team and met with patient. 34 year old male, presented to ED expressing suicidal ideations of overdosing and requesting assistance for substance abuse.  He reports long history of opiate and (IV heroin) use disorder.  He reports worsening depression over days to weeks preceding admission, neurovegetative symptoms of depression, suicidal thoughts of overdosing.  Today patient presents dysphoric and irritable.  He answers questions with short phrases or monosyllables, and requests for session to be cut short as he is "tired". Today denies suicidal ideations.  He does appear future oriented and expresses some interest in going to rehab at discharge. No agitated or disruptive behaviors, limited milieu participation at this time. Denies medication side effects. Describes some opiate withdrawal symptoms to include nausea, aches/pains.  Currently denies diarrhea or vomiting.  States he was able to have "a little" of his breakfast.  Principal Problem: Opioid dependence with opioid-induced mood disorder (HCC) Diagnosis: Principal Problem:   Opioid dependence with opioid-induced mood disorder ([place]) Active Problems:   MDD (major depressive disorder), recurrent severe, without psychosis ([place])   Opioid use disorder, severe, dependence ([place])   Suicidal ideation  Total Time spent with patient: 15 minutes  Past Psychiatric History:   Past Medical History:  Past Medical History:  Diagnosis Date  . Back pain   . Drug abuse ([place])   . IV drug abuse ([place])   . IVDU (intravenous drug user)   . LFTs abnormal     Past Surgical History:  Procedure Laterality Date  . I & D EXTREMITY Right 04/28/2019   Procedure: IRRIGATION AND DEBRIDEMENT ARM ABSCESS;  Surgeon: Renette Butters, MD;  Location: [place];  Service: Orthopedics;  Laterality: Right;  . TEE WITHOUT CARDIOVERSION N/A 05/03/2019   Procedure: TRANSESOPHAGEAL ECHOCARDIOGRAM (TEE);  Surgeon: Jerline Pain, MD;  Location: [facility] ENDOSCOPY;  Service: Cardiovascular;  Laterality: N/A;   Family History: History reviewed. No pertinent family history. Family Psychiatric  History:  Social History:  Social History   Substance and Sexual Activity  Alcohol Use Yes     Social History   Substance and Sexual Activity  Drug Use Yes  . Types: IV, Cocaine, Amphetamines, Marijuana, Heroin, Methamphetamines    Social History   Socioeconomic History  . Marital status: Unknown    Spouse name: Not on file  . Number of children: Not on file  . Years of education: Not on file  . Highest education level: Not on file  Occupational History  . Not on file  Tobacco Use  . Smoking status: Current Every Day Smoker  . Smokeless tobacco: Current User  Substance and Sexual Activity  . Alcohol use: Yes  . Drug use: Yes    Types: IV, Cocaine, Amphetamines, Marijuana, Heroin, Methamphetamines  . Sexual activity: Not on file  Other Topics Concern  . Not on file  Social History Narrative   ** Merged History Encounter **       Social Determinants of Health   Financial Resource Strain:   . Difficulty of Paying Living Expenses: Not on file  Food Insecurity:   . Worried About Charity fundraiser in the Last Year: Not on file  . Ran Out of Food in the Last Year: Not on file  Transportation Needs:   . Lack of Transportation (Medical): Not on  file  . Lack of Transportation (Non-Medical): Not on file  Physical Activity:   . Days of Exercise per Week: Not on file  . Minutes of Exercise per Session: Not on file  Stress:   . Feeling of Stress : Not on file  Social Connections:   . Frequency of Communication with Friends and Family: Not on file  . Frequency of Social Gatherings with Friends and Family: Not on file  . Attends  Religious Services: Not on file  . Active Member of Clubs or Organizations: Not on file  . Attends Archivist Meetings: Not on file  . Marital Status: Not on file   Additional Social History:   Sleep: Fair  Appetite:  Fair  Current Medications: Current Facility-Administered Medications  Medication Dose Route Frequency Provider Last Rate Last Admin  . acetaminophen (TYLENOL) tablet 650 mg  650 mg Oral Q6H PRN Connye Burkitt, NP      . alum & mag hydroxide-simeth (MAALOX/MYLANTA) 200-200-20 MG/5ML suspension 30 mL  30 mL Oral Q4H PRN Connye Burkitt, NP      . cloNIDine (CATAPRES) tablet 0.1 mg  0.1 mg Oral QID Connye Burkitt, NP   0.1 mg at 10/30/19 1410   Followed by  . [START ON 11/01/2019] cloNIDine (CATAPRES) tablet 0.1 mg  0.1 mg Oral BH-qamhs Connye Burkitt, NP       Followed by  . [START ON 11/03/2019] cloNIDine (CATAPRES) tablet 0.1 mg  0.1 mg Oral QAC breakfast Connye Burkitt, NP      . dicyclomine (BENTYL) tablet 20 mg  20 mg Oral Q6H PRN Connye Burkitt, NP      . hydrOXYzine (ATARAX/VISTARIL) tablet 25 mg  25 mg Oral Q6H PRN Connye Burkitt, NP      . loperamide (IMODIUM) capsule 2-4 mg  2-4 mg Oral PRN Connye Burkitt, NP      . magnesium hydroxide (MILK OF MAGNESIA) suspension 30 mL  30 mL Oral Daily PRN Connye Burkitt, NP      . methocarbamol (ROBAXIN) tablet 500 mg  500 mg Oral Q8H PRN Connye Burkitt, NP      . naproxen (NAPROSYN) tablet 500 mg  500 mg Oral BID PRN Connye Burkitt, NP      . nicotine (NICODERM CQ - dosed in mg/24 hours) patch 14 mg  14 mg Transdermal Daily Connye Burkitt, NP      . ondansetron (ZOFRAN-ODT) disintegrating tablet 4 mg  4 mg Oral Q6H PRN Connye Burkitt, NP      . traZODone (DESYREL) tablet 50 mg  50 mg Oral QHS PRN Connye Burkitt, NP        Lab Results:  Results for orders placed or performed during the hospital encounter of 10/29/19 (from the past 48 hour(s))  Comprehensive metabolic panel     Status: Abnormal   Collection Time:  10/30/19 12:01 AM  Result Value Ref Range   Sodium 142 135 - 145 mmol/L   Potassium 3.6 3.5 - 5.1 mmol/L   Chloride 105 98 - 111 mmol/L   CO2 27 22 - 32 mmol/L   Glucose, Bld 93 70 - 99 mg/dL    Comment: Glucose reference range applies only to samples taken after fasting for at least 8 hours.   BUN 10 6 - 20 mg/dL   Creatinine, Ser 1.11 0.61 - 1.24 mg/dL   Calcium 9.5 8.9 - 10.3 mg/dL   Total Protein 7.6 6.5 -  8.1 g/dL   Albumin 3.6 3.5 - 5.0 g/dL   AST 67 (H) 15 - 41 U/L   ALT 91 (H) 0 - 44 U/L   Alkaline Phosphatase 77 38 - 126 U/L   Total Bilirubin 0.6 0.3 - 1.2 mg/dL   GFR calc non Af Amer >60 >60 mL/min   GFR calc Af Amer >60 >60 mL/min   Anion gap 10 5 - 15    Comment: Performed at [facility] [address], [place], [place] [address]  cbc     Status: Abnormal   Collection Time: 10/30/19 12:01 AM  Result Value Ref Range   WBC 11.0 (H) 4.0 - 10.5 K/uL   RBC 4.84 4.22 - 5.81 MIL/uL   Hemoglobin 13.2 13.0 - 17.0 g/dL   HCT 40.3 39.0 - 52.0 %   MCV 83.3 80.0 - 100.0 fL   MCH 27.3 26.0 - 34.0 pg   MCHC 32.8 30.0 - 36.0 g/dL   RDW 15.3 11.5 - 15.5 %   Platelets 285 150 - 400 K/uL   nRBC 0.0 0.0 - 0.2 %    Comment: Performed at [facility], [place] [address], [place], [place] [address]  Ethanol     Status: None   Collection Time: 10/30/19 12:06 AM  Result Value Ref Range   Alcohol, Ethyl (B) <10 <10 mg/dL    Comment: (NOTE) Lowest detectable limit for serum alcohol is 10 mg/dL. For medical purposes only. Performed at [facility], [place] [address], [place], [place] [address]   Salicylate level     Status: Abnormal   Collection Time: 10/30/19 12:06 AM  Result Value Ref Range   Salicylate Lvl <8.8 (L) 7.0 - 30.0 mg/dL    Comment: Performed at [facility] [address], [place], [place] [address]  Acetaminophen level     Status: Abnormal   Collection Time: 10/30/19 12:06 AM  Result Value Ref Range   Acetaminophen (Tylenol), Serum <10 (L) 10 -  30 ug/mL    Comment: (NOTE) Therapeutic concentrations vary significantly. A range of 10-30 ug/mL  may be an effective concentration for many patients. However, some  are best treated at concentrations outside of this range. Acetaminophen concentrations >150 ug/mL at 4 hours after ingestion  and >50 ug/mL at 12 hours after ingestion are often associated with  toxic reactions. Performed at [facility], [place] [address], [place], [place] [address]   Rapid urine drug screen (hospital performed)     Status: Abnormal   Collection Time: 10/30/19 12:15 AM  Result Value Ref Range   Opiates POSITIVE (A) NONE DETECTED   Cocaine NONE DETECTED NONE DETECTED   Benzodiazepines NONE DETECTED NONE DETECTED   Amphetamines POSITIVE (A) NONE DETECTED   Tetrahydrocannabinol POSITIVE (A) NONE DETECTED   Barbiturates NONE DETECTED NONE DETECTED    Comment: (NOTE) DRUG SCREEN FOR MEDICAL PURPOSES ONLY.  IF CONFIRMATION IS NEEDED FOR ANY PURPOSE, NOTIFY LAB WITHIN 5 DAYS. LOWEST DETECTABLE LIMITS FOR URINE DRUG SCREEN Drug Class                     Cutoff (ng/mL) Amphetamine and metabolites    1000 Barbiturate and metabolites    200 Benzodiazepine                 150 Tricyclics and metabolites     300 Opiates and metabolites        300 Cocaine and metabolites  300 THC                            50 Performed at [facility], [place] [address], [place], [place] [address]   SARS CORONAVIRUS 2 (TAT 6-24 HRS) Nasopharyngeal Nasopharyngeal Swab     Status: None   Collection Time: 10/30/19  2:41 AM   Specimen: Nasopharyngeal Swab  Result Value Ref Range   SARS Coronavirus 2 NEGATIVE NEGATIVE    Comment: (NOTE) SARS-CoV-2 target nucleic acids are NOT DETECTED. The SARS-CoV-2 RNA is generally detectable in upper and lower respiratory specimens during the acute phase of infection. Negative results do not preclude SARS-CoV-2 infection, do not rule out co-infections with other pathogens,  and should not be used as the sole basis for treatment or other patient management decisions. Negative results must be combined with clinical observations, patient history, and epidemiological information. The expected result is Negative. Fact Sheet for Patients: SugarRoll.be Fact Sheet for Healthcare Providers: https://www.woods-mathews.com/ This test is not yet approved or cleared by the [place] FDA and  has been authorized for detection and/or diagnosis of SARS-CoV-2 by FDA under an Emergency Use Authorization (EUA). This EUA will remain  in effect (meaning this test can be used) for the duration of the COVID-19 declaration under Section 56 4(b)(1) of the Act, 21 U.S.C. section 360bbb-3(b)(1), unless the authorization is terminated or revoked sooner. Performed at [facility], [place] [address], [place], [place] [address]     Blood Alcohol level:  Lab Results  Component Value Date   ETH <10 10/30/2019   ETH <10 26/83/4196    Metabolic Disorder Labs: Lab Results  Component Value Date   HGBA1C 5.8 (H) 04/29/2019   MPG 119.76 04/29/2019   No results found for: PROLACTIN No results found for: CHOL, TRIG, HDL, CHOLHDL, VLDL, LDLCALC  Physical Findings: AIMS:  , ,  ,  ,    CIWA:    COWS:  COWS Total Score: 1  Musculoskeletal: Strength & Muscle Tone: within normal limits no psychomotor agitation, no restlessness Gait & Station: normal Patient leans: N/A  Psychiatric Specialty Exam: Physical Exam  Review of Systems denies chest pain or shortness of breath, denies vomiting, no diarrhea  Blood pressure 111/81, pulse (!) 59, temperature 98.2 F (36.8 C), temperature source Oral, resp. rate 18, height 6' 1"  (1.854 m), weight 77.1 kg, SpO2 100 %.Body mass index is 22.43 kg/m.  General Appearance: Fairly Groomed  Eye Contact:  Minimal  Speech:  Normal Rate  Volume:  Normal  Mood:  Dysphoric and Irritable  Affect:   Congruent  Thought Process:  Linear and Descriptions of Associations: Intact  Orientation:  Other:  Alert and attentive  Thought Content:  No hallucinations, no delusions  Suicidal Thoughts:  No currently denies suicidal or self-injurious ideations, also denies homicidal or violent ideations  Homicidal Thoughts:  No  Memory:  Recent and remote grossly intact  Judgement:  Other:  Fair  Insight:  Fair  Psychomotor Activity:  Decreased  Concentration:  Concentration: Fair and Attention Span: Fair  Recall:  [employer] of Knowledge:  Fair  Language:  Fair  Akathisia:  Negative  Handed:  Right  AIMS (if indicated):     Assets:  Desire for Improvement Resilience  ADL's:  Intact  Cognition:  WNL  Sleep:  Number of Hours: 4   Assessment:  34 year old male, presented to ED expressing suicidal ideations of overdosing and  requesting assistance for substance abuse.  He reports long history of opiate and (IV heroin) use disorder.  He reports worsening depression over days to weeks preceding admission, neurovegetative symptoms of depression, suicidal thoughts of overdosing.  At this time patient remains irritable, dysphoric, fairly related, with limited milieu participation.  He endorses some ongoing opiate withdrawal symptoms but currently denies vomiting or diarrhea.  He denies suicidal ideations and presents future oriented, expressing interest in a rehab at discharge.  Tolerating opiate detox protocol well thus far.  Treatment Plan Summary: Daily contact with patient to assess and evaluate symptoms and progress in treatment, Medication management, Plan Inpatient treatment and Medications as below Encourage group and milieu participation Encourage efforts to work on sobriety and relapse prevention Continue opiate detox protocol with clonidine taper and symptomatic treatment of withdrawal symptoms D/C Vistaril - see below Start Ativan 0.5 mgrs Q 6 hours PRN for anxiety Treatment team working on  disposition planning options, patient is expressing interest in going to rehab at discharge. *QTc was elevated ( 501) on 06/03/2019 EKG, will repeat to monitor. Jenne Campus, MD 10/31/2019, 12:42 PM

## 2019-11-01 MED ORDER — LORAZEPAM 1 MG PO TABS
1.0000 mg | ORAL_TABLET | ORAL | Status: AC
  Administered 2019-11-01: 1 mg via ORAL
  Filled 2019-11-01: qty 1

## 2019-11-01 MED ORDER — DULOXETINE HCL 30 MG PO CPEP
30.0000 mg | ORAL_CAPSULE | Freq: Every day | ORAL | Status: DC
  Administered 2019-11-01 – 2019-11-03 (×3): 30 mg via ORAL
  Filled 2019-11-01 (×2): qty 1
  Filled 2019-11-01: qty 7
  Filled 2019-11-01: qty 1
  Filled 2019-11-01: qty 7
  Filled 2019-11-01 (×2): qty 1

## 2019-11-01 MED ORDER — TRAZODONE HCL 100 MG PO TABS
100.0000 mg | ORAL_TABLET | Freq: Once | ORAL | Status: AC
  Administered 2019-11-01: 100 mg via ORAL
  Filled 2019-11-01 (×2): qty 1

## 2019-11-01 NOTE — Progress Notes (Signed)
   11/01/19 2300  Psych Admission Type (Psych Patients Only)  Admission Status Voluntary  Psychosocial Assessment  Patient Complaints Anxiety  Eye Contact Brief  Facial Expression Anxious  Affect Irritable  Speech Logical/coherent  Interaction Avoidant  Motor Activity Slow  Appearance/Hygiene Disheveled  Behavior Characteristics Cooperative  Mood Anxious  Thought Process  Coherency WDL  Content WDL  Delusions None reported or observed  Perception WDL  Hallucination None reported or observed  Judgment Poor  Confusion None  Danger to Self  Current suicidal ideation? Denies  Danger to Others  Danger to Others None reported or observed

## 2019-11-01 NOTE — Progress Notes (Signed)
[facility] MD Progress Note  11/01/2019 11:52 AM Jon Castro  MRN:  [id number] Subjective: Patient reports feeling "the same". He describes some ongoing symptoms of opiate withdrawal, particularly some nausea and loose stools.  Also endorses some lacrimation.  Denies vomiting and has been able to tolerate meals well.  He does endorse improving appetite. Denies suicidal ideations. Objective: I discussed case with treatment team and met with patient. 34 year old male, presented to ED expressing suicidal ideations of overdosing and requesting assistance for substance abuse.  He reports long history of opiate and (IV heroin) use disorder.  He reports worsening depression over days to weeks preceding admission, neurovegetative symptoms of depression, suicidal thoughts of overdosing.  Presents alert, attentive, fairly related, in bed with fair eye contact.  States he is feeling "the same".  He endorses some ongoing symptoms of opiate withdrawal, but denies vomiting and has been tolerating p.o. intake well.  Vitals are stable. Limited milieu participation.  Tends to stay in his room.  He is visible on unit for phone calls and meals. He denies suicidal ideations and is future oriented, has expressed interest in going to rehab. We reviewed medication management options.  He expresses interest in starting an antidepressant as he feels that his mood remains depressed even during episodes of sobriety/abstinence.  We discussed options.  He agrees to Cymbalta which may help address depression, anxiety and also chronic pain (reports chronic lower back pain).  Side effects reviewed.  Principal Problem: Opioid dependence with opioid-induced mood disorder (HCC) Diagnosis: Principal Problem:   Opioid dependence with opioid-induced mood disorder (HCC) Active Problems:   MDD (major depressive disorder), recurrent severe, without psychosis ([place])   Opioid use disorder, severe, dependence ([place])   Suicidal  ideation  Total Time spent with patient: 15 minutes  Past Psychiatric History:   Past Medical History:  Past Medical History:  Diagnosis Date  . Back pain   . Drug abuse ([place])   . IV drug abuse ([place])   . IVDU (intravenous drug user)   . LFTs abnormal     Past Surgical History:  Procedure Laterality Date  . I & D EXTREMITY Right 04/28/2019   Procedure: IRRIGATION AND DEBRIDEMENT ARM ABSCESS;  Surgeon: Renette Butters, MD;  Location: [facility];  Service: Orthopedics;  Laterality: Right;  . TEE WITHOUT CARDIOVERSION N/A 05/03/2019   Procedure: TRANSESOPHAGEAL ECHOCARDIOGRAM (TEE);  Surgeon: Jerline Pain, MD;  Location: [facility] ENDOSCOPY;  Service: Cardiovascular;  Laterality: N/A;   Family History: History reviewed. No pertinent family history. Family Psychiatric  History:  Social History:  Social History   Substance and Sexual Activity  Alcohol Use Yes     Social History   Substance and Sexual Activity  Drug Use Yes  . Types: IV, Cocaine, Amphetamines, Marijuana, Heroin, Methamphetamines    Social History   Socioeconomic History  . Marital status: Unknown    Spouse name: Not on file  . Number of children: Not on file  . Years of education: Not on file  . Highest education level: Not on file  Occupational History  . Not on file  Tobacco Use  . Smoking status: Current Every Day Smoker  . Smokeless tobacco: Current User  Substance and Sexual Activity  . Alcohol use: Yes  . Drug use: Yes    Types: IV, Cocaine, Amphetamines, Marijuana, Heroin, Methamphetamines  . Sexual activity: Not on file  Other Topics Concern  . Not on file  Social History Narrative   ** Merged History Encounter **  Social Determinants of Health   Financial Resource Strain:   . Difficulty of Paying Living Expenses: Not on file  Food Insecurity:   . Worried About Charity fundraiser in the Last Year: Not on file  . Ran Out of Food in the Last Year: Not on file  Transportation Needs:   .  Lack of Transportation (Medical): Not on file  . Lack of Transportation (Non-Medical): Not on file  Physical Activity:   . Days of Exercise per Week: Not on file  . Minutes of Exercise per Session: Not on file  Stress:   . Feeling of Stress : Not on file  Social Connections:   . Frequency of Communication with Friends and Family: Not on file  . Frequency of Social Gatherings with Friends and Family: Not on file  . Attends Religious Services: Not on file  . Active Member of Clubs or Organizations: Not on file  . Attends Archivist Meetings: Not on file  . Marital Status: Not on file   Additional Social History:   Sleep: Fair/improving  Appetite:  Fair/improving  Current Medications: Current Facility-Administered Medications  Medication Dose Route Frequency Provider Last Rate Last Admin  . acetaminophen (TYLENOL) tablet 650 mg  650 mg Oral Q6H PRN Connye Burkitt, NP      . alum & mag hydroxide-simeth (MAALOX/MYLANTA) 200-200-20 MG/5ML suspension 30 mL  30 mL Oral Q4H PRN Connye Burkitt, NP      . cloNIDine (CATAPRES) tablet 0.1 mg  0.1 mg Oral BH-qamhs Connye Burkitt, NP   0.1 mg at 11/01/19 1610   Followed by  . [START ON 11/03/2019] cloNIDine (CATAPRES) tablet 0.1 mg  0.1 mg Oral QAC breakfast Connye Burkitt, NP      . dicyclomine (BENTYL) tablet 20 mg  20 mg Oral Q6H PRN Connye Burkitt, NP   20 mg at 10/31/19 1745  . loperamide (IMODIUM) capsule 2-4 mg  2-4 mg Oral PRN Connye Burkitt, NP      . LORazepam (ATIVAN) tablet 0.5 mg  0.5 mg Oral Q6H PRN Diavian Furgason, Myer Peer, MD   0.5 mg at 11/01/19 1130  . magnesium hydroxide (MILK OF MAGNESIA) suspension 30 mL  30 mL Oral Daily PRN Connye Burkitt, NP      . methocarbamol (ROBAXIN) tablet 500 mg  500 mg Oral Q8H PRN Connye Burkitt, NP   500 mg at 10/31/19 1745  . naproxen (NAPROSYN) tablet 500 mg  500 mg Oral BID PRN Connye Burkitt, NP   500 mg at 10/31/19 1745  . nicotine (NICODERM CQ - dosed in mg/24 hours) patch 14 mg  14 mg  Transdermal Daily Connye Burkitt, NP   14 mg at 11/01/19 0940  . ondansetron (ZOFRAN-ODT) disintegrating tablet 4 mg  4 mg Oral Q6H PRN Connye Burkitt, NP        Lab Results:  No results found for this or any previous visit (from the past 7 hour(s)).  Blood Alcohol level:  Lab Results  Component Value Date   ETH <10 10/30/2019   ETH <10 96/11/5407    Metabolic Disorder Labs: Lab Results  Component Value Date   HGBA1C 5.8 (H) 04/29/2019   MPG 119.76 04/29/2019   No results found for: PROLACTIN No results found for: CHOL, TRIG, HDL, CHOLHDL, VLDL, LDLCALC  Physical Findings: AIMS:  , ,  ,  ,    CIWA:    COWS:  COWS Total Score: 0  Musculoskeletal: Strength & Muscle Tone: within normal limits no psychomotor agitation, no restlessness Gait & Station: normal Patient leans: N/A  Psychiatric Specialty Exam: Physical Exam  Review of Systems denies chest pain or shortness of breath, denies vomiting, endorses loose stools  Blood pressure 120/87, pulse 96, temperature 97.8 F (36.6 C), temperature source Oral, resp. rate 16, height 6' 1"  (1.854 m), weight 77.1 kg, SpO2 100 %.Body mass index is 22.43 kg/m.  General Appearance: Fairly Groomed  Eye Contact:  Fair  Speech:  Normal Rate  Volume:  Normal  Mood:  Describes mood as "the same" remains vaguely dysphoric  Affect:  Congruent  Thought Process:  Linear and Descriptions of Associations: Intact  Orientation:  Other:  Alert and attentive  Thought Content:  No hallucinations, no delusions  Suicidal Thoughts:  No currently denies suicidal or self-injurious ideations, also denies homicidal or violent ideations  Homicidal Thoughts:  No  Memory:  Recent and remote grossly intact  Judgement:  Other:  Fair  Insight:  Fair  Psychomotor Activity:  Normal and No psychomotor agitation or restlessness  Concentration:  Concentration: Fair and Attention Span: Fair  Recall:  [employer] of Knowledge:  Fair  Language:  Fair  Akathisia:   Negative  Handed:  Right  AIMS (if indicated):     Assets:  Desire for Improvement Resilience  ADL's:  Intact  Cognition:  WNL  Sleep:  Number of Hours: 5.25   Assessment:  34 year old male, presented to ED expressing suicidal ideations of overdosing and requesting assistance for substance abuse.  He reports long history of opiate and (IV heroin) use disorder.  He reports worsening depression over days to weeks preceding admission, neurovegetative symptoms of depression, suicidal thoughts of overdosing.  Patient is reporting persistent but gradually improving symptoms of opiate withdrawal.  He is describing improving appetite and denies vomiting.  He remains dysphoric and vaguely irritable , with limited milieu participation at this time.  He denies suicidal ideations.  Today is expressing interest in starting an antidepressant medication and agrees to Cymbalta trial which may help mood and also help address chronic back pain.  Side effects reviewed.  Treatment Plan Summary: Daily contact with patient to assess and evaluate symptoms and progress in treatment, Medication management, Plan Inpatient treatment and Medications as below Encourage group and milieu participation Encourage efforts to work on sobriety and relapse prevention Continue opiate detox protocol with clonidine taper and symptomatic treatment of withdrawal symptoms Continue Ativan 0.5 mgrs Q 6 hours PRN for anxiety Start Cymbalta 30 mg daily for depression, pain EKG ordered to monitor QTc  Treatment team working on disposition planning options, patient is expressing interest in going to rehab at discharge. Jenne Campus, MD 11/01/2019, 11:52 AM    Patient ID: Roetta Sessions, male   DOB: Dec 17, 1985, 34 y.o.   MRN: [id number]

## 2019-11-01 NOTE — B[facility] Counselor (Signed)
CSW faxed referrals for residential treatment on patient's behalf.  Referral faxed to [facility].  Referral faxed to [facility]. Authorization# [id number] Valid 03/05 through 03/11  Enid Cutter, MSW, LCSW-A Clinical Social Worker [facility]  [phone]

## 2019-11-01 NOTE — Progress Notes (Signed)
   11/01/19 0915  Psych Admission Type (Psych Patients Only)  Admission Status Voluntary  Psychosocial Assessment  Patient Complaints Anxiety  Eye Contact Brief  Facial Expression Anxious  Affect Irritable  Speech Logical/coherent  Interaction Avoidant  Motor Activity Slow  Appearance/Hygiene Disheveled  Behavior Characteristics Cooperative  Mood Anxious  Thought Process  Coherency WDL  Content WDL  Delusions None reported or observed  Perception WDL  Hallucination None reported or observed  Judgment Poor  Confusion None  Danger to Self  Current suicidal ideation? Denies  Danger to Others  Danger to Others None reported or observed

## 2019-11-01 NOTE — [facility] Group Notes (Signed)
11/01/2019 8:45am Type of Group and Topic: Psychoeducational Group: Discharge Planning  Participation Level: Did Not Attend  Description of Group Discharge planning group reviews patient's anticipated discharge plans and assists patients to anticipate and address any barriers to wellness/recovery in the community. Suicide prevention education is reviewed with patients in group. Therapeutic Goals 1. Patients will state their anticipated discharge plan and mental health aftercare 2. Patients will identify potential barriers to wellness in the community setting 3. Patients will engage in problem solving, solution focused discussion of ways to anticipate and address barriers to wellness/recovery   Summary of Patient Progress Plan for Discharge/Comments:  Invited, chose not to attend.     Baldo Daub, MSW, LCSWA Clinical Social Worker [facility]  Phone: [phone] 11/01/2019 1:56 PM

## 2019-11-01 NOTE — Progress Notes (Signed)
Pt did not attend morning goals group. 

## 2019-11-01 NOTE — Progress Notes (Signed)
   11/01/19 2000  Psych Admission Type (Psych Patients Only)  Admission Status Voluntary  Psychosocial Assessment  Patient Complaints Anxiety  Eye Contact Brief  Facial Expression Anxious  Affect Irritable  Speech Logical/coherent  Interaction Avoidant  Motor Activity Slow  Appearance/Hygiene Disheveled  Behavior Characteristics Cooperative  Mood Anxious  Thought Process  Coherency WDL  Content WDL  Delusions None reported or observed  Perception WDL  Hallucination None reported or observed  Judgment Poor  Confusion None  Danger to Self  Current suicidal ideation? Denies  Danger to Others  Danger to Others None reported or observed

## 2019-11-01 NOTE — Progress Notes (Signed)
Recreation Therapy Notes  Date: 3.5.21 Time: 0930 Location: [address] Dayroom  Group Topic: Stress Management  Goal Area(s) Addresses:  Patient will identify positive stress management techniques. Patient will identify benefits of using stress management post d/c.  Intervention: Stress Management  Activity :  Meditation.  LRT played a meditation that focused on making the most of your day and bringing positive intentions into how you maneuver through the day.  Education:  Stress Management, Discharge Planning.   Education Outcome: Acknowledges Education  Clinical Observations/Feedback: Pt did not attend group session.    Caroll Rancher, LRT/CTRS         Lillia Abed, Graylyn Bunney A 11/01/2019 11:08 AM

## 2019-11-02 NOTE — Progress Notes (Signed)
   11/02/19 2225  COVID-19 Daily Checkoff  Have you had a fever (temp > 37.80C/100F)  in the past 24 hours?  No  If you have had runny nose, nasal congestion, sneezing in the past 24 hours, has it worsened? No  COVID-19 EXPOSURE  Have you traveled outside the state in the past 14 days? No  Have you been in contact with someone with a confirmed diagnosis of COVID-19 or PUI in the past 14 days without wearing appropriate PPE? No  Have you been living in the same home as a person with confirmed diagnosis of COVID-19 or a PUI (household contact)? No  Have you been diagnosed with COVID-19? No

## 2019-11-02 NOTE — Progress Notes (Signed)
   11/02/19 2228  Psych Admission Type (Psych Patients Only)  Admission Status Voluntary  Psychosocial Assessment  Patient Complaints Depression  Eye Contact Fair  Facial Expression Flat  Affect Appropriate to circumstance  Speech Logical/coherent  Interaction Assertive  Motor Activity Other (Comment) (WDL)  Appearance/Hygiene Disheveled  Behavior Characteristics Appropriate to situation  Mood Depressed;Pleasant  Thought Process  Content WDL  Delusions None reported or observed  Perception WDL  Hallucination None reported or observed  Judgment Poor  Confusion None  Danger to Self  Current suicidal ideation? Denies  Danger to Others  Danger to Others None reported or observed

## 2019-11-02 NOTE — Progress Notes (Signed)
[facility] MD Progress Note  11/02/2019 11:32 AM Jon Castro  MRN:  [id number] Subjective: Patient reports " I think I need to stay a little longer". He describes some ongoing opiate WDL symptoms, such as diffuse aches, some cramps, rhinorrhea, but states symptoms are subsiding gradually. No vomiting . Objective: I have reviewed chart notes  and met with patient. 34 year old male, presented to ED expressing suicidal ideations of overdosing and requesting assistance for substance abuse.  He reports long history of opiate and (IV heroin) use disorder.  He reports worsening depression over days to weeks preceding admission, neurovegetative symptoms of depression, suicidal thoughts of overdosing.  Yesterday afternoon patient had expressed desire to discharge . This AM states " I think I need to stay a little longer" and reports he was experiencing increased cravings yesterday and feels his risk of relapse would have been high. I have encouraged him to continue to consider referral to rehab. As above, reports some lingering symptoms of opiate WDL- currently presents calm, comfortable, in no acute distress.No vomiting and has been tolerating PO intake well. Remains vaguely dysphoric /irritable, but to a lesser degree than on admission and is noted to be better related, more communicative, with improved eye contact. Spending more time out of room/ in day room. Denies medication side effects. Denies SI.   Principal Problem: Opioid dependence with opioid-induced mood disorder (HCC) Diagnosis: Principal Problem:   Opioid dependence with opioid-induced mood disorder (HCC) Active Problems:   MDD (major depressive disorder), recurrent severe, without psychosis ([place])   Opioid use disorder, severe, dependence ([place])   Suicidal ideation  Total Time spent with patient: 15 minutes  Past Psychiatric History:   Past Medical History:  Past Medical History:  Diagnosis Date  . Back pain   . Drug abuse ([place])    . IV drug abuse ([place])   . IVDU (intravenous drug user)   . LFTs abnormal     Past Surgical History:  Procedure Laterality Date  . I & D EXTREMITY Right 04/28/2019   Procedure: IRRIGATION AND DEBRIDEMENT ARM ABSCESS;  Surgeon: Renette Butters, MD;  Location: [facility];  Service: Orthopedics;  Laterality: Right;  . TEE WITHOUT CARDIOVERSION N/A 05/03/2019   Procedure: TRANSESOPHAGEAL ECHOCARDIOGRAM (TEE);  Surgeon: Jerline Pain, MD;  Location: [facility] ENDOSCOPY;  Service: Cardiovascular;  Laterality: N/A;   Family History: History reviewed. No pertinent family history. Family Psychiatric  History:  Social History:  Social History   Substance and Sexual Activity  Alcohol Use Yes     Social History   Substance and Sexual Activity  Drug Use Yes  . Types: IV, Cocaine, Amphetamines, Marijuana, Heroin, Methamphetamines    Social History   Socioeconomic History  . Marital status: Unknown    Spouse name: Not on file  . Number of children: Not on file  . Years of education: Not on file  . Highest education level: Not on file  Occupational History  . Not on file  Tobacco Use  . Smoking status: Current Every Day Smoker  . Smokeless tobacco: Current User  Substance and Sexual Activity  . Alcohol use: Yes  . Drug use: Yes    Types: IV, Cocaine, Amphetamines, Marijuana, Heroin, Methamphetamines  . Sexual activity: Not on file  Other Topics Concern  . Not on file  Social History Narrative   ** Merged History Encounter **       Social Determinants of Health   Financial Resource Strain:   . Difficulty of Paying Living  Expenses: Not on file  Food Insecurity:   . Worried About Charity fundraiser in the Last Year: Not on file  . Ran Out of Food in the Last Year: Not on file  Transportation Needs:   . Lack of Transportation (Medical): Not on file  . Lack of Transportation (Non-Medical): Not on file  Physical Activity:   . Days of Exercise per Week: Not on file  . Minutes of Exercise  per Session: Not on file  Stress:   . Feeling of Stress : Not on file  Social Connections:   . Frequency of Communication with Friends and Family: Not on file  . Frequency of Social Gatherings with Friends and Family: Not on file  . Attends Religious Services: Not on file  . Active Member of Clubs or Organizations: Not on file  . Attends Archivist Meetings: Not on file  . Marital Status: Not on file   Additional Social History:   Sleep: Fair/improving  Appetite:  Fair/improving  Current Medications: Current Facility-Administered Medications  Medication Dose Route Frequency Provider Last Rate Last Admin  . acetaminophen (TYLENOL) tablet 650 mg  650 mg Oral Q6H PRN Connye Burkitt, NP      . alum & mag hydroxide-simeth (MAALOX/MYLANTA) 200-200-20 MG/5ML suspension 30 mL  30 mL Oral Q4H PRN Connye Burkitt, NP   30 mL at 11/01/19 2232  . cloNIDine (CATAPRES) tablet 0.1 mg  0.1 mg Oral BH-qamhs Connye Burkitt, NP   0.1 mg at 11/02/19 0859   Followed by  . [START ON 11/03/2019] cloNIDine (CATAPRES) tablet 0.1 mg  0.1 mg Oral QAC breakfast Connye Burkitt, NP      . dicyclomine (BENTYL) tablet 20 mg  20 mg Oral Q6H PRN Connye Burkitt, NP   20 mg at 10/31/19 1745  . DULoxetine (CYMBALTA) DR capsule 30 mg  30 mg Oral Daily Ellie Bryand, Myer Peer, MD   30 mg at 11/02/19 0858  . loperamide (IMODIUM) capsule 2-4 mg  2-4 mg Oral PRN Connye Burkitt, NP      . LORazepam (ATIVAN) tablet 0.5 mg  0.5 mg Oral Q6H PRN Noemie Devivo, Myer Peer, MD   0.5 mg at 11/01/19 2119  . magnesium hydroxide (MILK OF MAGNESIA) suspension 30 mL  30 mL Oral Daily PRN Connye Burkitt, NP      . methocarbamol (ROBAXIN) tablet 500 mg  500 mg Oral Q8H PRN Connye Burkitt, NP   500 mg at 10/31/19 1745  . naproxen (NAPROSYN) tablet 500 mg  500 mg Oral BID PRN Connye Burkitt, NP   500 mg at 10/31/19 1745  . nicotine (NICODERM CQ - dosed in mg/24 hours) patch 14 mg  14 mg Transdermal Daily Connye Burkitt, NP   14 mg at 11/02/19 0902   . ondansetron (ZOFRAN-ODT) disintegrating tablet 4 mg  4 mg Oral Q6H PRN Connye Burkitt, NP        Lab Results:  No results found for this or any previous visit (from the past 66 hour(s)).  Blood Alcohol level:  Lab Results  Component Value Date   ETH <10 10/30/2019   ETH <10 38/18/2993    Metabolic Disorder Labs: Lab Results  Component Value Date   HGBA1C 5.8 (H) 04/29/2019   MPG 119.76 04/29/2019   No results found for: PROLACTIN No results found for: CHOL, TRIG, HDL, CHOLHDL, VLDL, LDLCALC  Physical Findings: AIMS:  , ,  ,  ,  CIWA:    COWS:  COWS Total Score: 4  Musculoskeletal: Strength & Muscle Tone: within normal limits no psychomotor agitation, no restlessness Gait & Station: normal Patient leans: N/A  Psychiatric Specialty Exam: Physical Exam  Review of Systems reports some persistent aches, cramps. Denies vomiting, no diarrhea   Blood pressure 108/82, pulse (!) 117, temperature 97.9 F (36.6 C), temperature source Oral, resp. rate 16, height 6' 1"  (1.854 m), weight 77.1 kg, SpO2 100 %.Body mass index is 22.43 kg/m.  General Appearance: improving grooming   Eye Contact:  improving   Speech:  Normal Rate  Volume:  Normal  Mood:  gradually improving mood, less dysphoric   Affect:  less irritable   Thought Process:  Linear and Descriptions of Associations: Intact  Orientation:  Other:  Alert and attentive  Thought Content:  No hallucinations, no delusions  Suicidal Thoughts:  No currently denies suicidal or self-injurious ideations, also denies homicidal or violent ideations  Homicidal Thoughts:  No  Memory:  Recent and remote grossly intact  Judgement:  Other:  improving   Insight:  Fair/ improving   Psychomotor Activity:  Normal and No psychomotor agitation or restlessness  Concentration:  Concentration: improving  and Attention Span: improving '  Recall:  Good  Fund of Knowledge:  Good  Language:  Good  Akathisia:  Negative  Handed:  Right  AIMS  (if indicated):     Assets:  Desire for Improvement Resilience  ADL's:  Intact  Cognition:  WNL  Sleep:  Number of Hours: 4.25   Assessment:  34 year old male, presented to ED expressing suicidal ideations of overdosing and requesting assistance for substance abuse.  He reports long history of opiate and (IV heroin) use disorder.  He reports worsening depression over days to weeks preceding admission, neurovegetative symptoms of depression, suicidal thoughts of overdosing.  Patient reports some persistent opiate WDL symptoms such as aches/cramps, but describes overall improvement and currently presents calm and in no acute distress. He is tolerating PO intake well and denies vomiting. Yesterday evening had felt restless and focused on discharging but today states he was experiencing opiate cravings at the time and that he thinks he will benefit from further inpatient care at this time. I have encouraged him to continue to consider a rehab as disposition option. Tolerating Cymbalta well thus far .   Treatment Plan Summary: Daily contact with patient to assess and evaluate symptoms and progress in treatment, Medication management, Plan Inpatient treatment and Medications as below  Treatment Plan reviewed as below today 3/6  Encourage group and milieu participation Encourage efforts to work on sobriety and relapse prevention Treatment team working on disposition planning options Continue opiate detox protocol with clonidine taper and symptomatic treatment of withdrawal symptoms Continue Ativan 0.5 mgrs Q 6 hours PRN for anxiety Continue Cymbalta 30 mg daily for depression, pain  Jenne Campus, MD 11/02/2019, 11:32 AM    Patient ID: Roetta Sessions, male   DOB: Sep 27, 1985, 34 y.o.   MRN: [id number] Patient ID: Jase Reep, male   DOB: 09-27-1985, 34 y.o.   MRN: [id number]

## 2019-11-02 NOTE — BHH Group Notes (Signed)
Adult Psychoeducational Group Note  Date:  11/02/2019 Time:  11:20 AM  Group Topic/Focus:  Goals Group:   The focus of this group is to help patients establish daily goals to achieve during treatment and discuss how the patient can incorporate goal setting into their daily lives to aide in recovery.  Participation Level:  Did Not Attend   Dione Housekeeper 11/02/2019, 11:20 AM

## 2019-11-02 NOTE — BHH Group Notes (Signed)
LCSW Group Therapy Note  11/02/2019   10:00-11:00am   Type of Therapy and Topic:  Group Therapy: Anger Cues and Responses  Participation Level:  None   Description of Group:   In this group, patients learned how to recognize the physical, cognitive, emotional, and behavioral responses they have to anger-provoking situations.  They identified a recent time they became angry and how they reacted.  They analyzed how their reaction was possibly beneficial and how it was possibly unhelpful.  The group discussed a variety of healthier coping skills that could help with such a situation in the future.  Focus was placed on how helpful it is to recognize the underlying emotions to our anger, because working on those can lead to a more permanent solution as well as our ability to focus on the important rather than the urgent.  Therapeutic Goals: Patients will remember their last incident of anger and how they felt emotionally and physically, what their thoughts were at the time, and how they behaved. Patients will identify how their behavior at that time worked for them, as well as how it worked against them. Patients will explore possible new behaviors to use in future anger situations. Patients will learn that anger itself is normal and cannot be eliminated, and that healthier reactions can assist with resolving conflict rather than worsening situations.  Summary of Patient Progress:  The patient was invited to group, declined to attend.  Therapeutic Modalities:   Cognitive Behavioral Therapy  Lynnell Chad

## 2019-11-02 NOTE — Progress Notes (Signed)
   11/02/19 0900  Psych Admission Type (Psych Patients Only)  Admission Status Voluntary  Psychosocial Assessment  Patient Complaints Depression  Eye Contact Brief  Facial Expression Anxious  Affect Appropriate to circumstance  Speech Logical/coherent  Interaction Guarded  Motor Activity Slow  Appearance/Hygiene Disheveled  Behavior Characteristics Cooperative  Mood Depressed  Thought Process  Coherency WDL  Content WDL  Delusions None reported or observed  Perception WDL  Hallucination None reported or observed  Judgment Poor  Confusion None  Danger to Self  Current suicidal ideation? Denies  Danger to Others  Danger to Others None reported or observed

## 2019-11-03 MED ORDER — DULOXETINE HCL 30 MG PO CPEP: 30.0000 mg | ORAL_CAPSULE | Freq: Every day | ORAL | 0 refills | Status: DC

## 2019-11-03 MED ORDER — NICOTINE 14 MG/24HR TD PT24: 14.0000 mg | MEDICATED_PATCH | Freq: Every day | TRANSDERMAL | 0 refills | Status: DC

## 2019-11-03 NOTE — [facility] Group Notes (Signed)
[facility] LCSW Group Therapy Note  11/03/2019    Type of Therapy and Topic:  Group Therapy:  Adding Supports Including Yourself  Participation Level:  None   Description of Group:   Patients in this group were introduced to the concept that additional supports including self-support are an essential part of recovery.  Patients listed what supports they believe they need to add to their lives to achieve their goals at discharge, and they listed such things as therapist, family, doctor, support groups, 12-step groups and service animals.   A song entitled "My Own Hero" was played and a group discussion ensued in which patients stated they could relate to the song and it inspired them to realize they have be willing to help themselves in order to succeed, because other people cannot achieve sobriety or stability for them.  "Fight For It" was played, then "I Am Enough" to encourage patients.  They discussed the impact on them and how they must remain convinced that their lives are worth the effort it takes to become sober and/or stable.  Therapeutic Goals: 1)  demonstrate the importance of being a key part of one's own support system 2)  discuss various available supports 3)  encourage patient to use music as part of their self-support and focus on goals 4)  elicit ideas from patients about supports that need to be added   Summary of Patient Progress:  The patient expressed nothing during group, kept yawning, and did not seem interested.   Therapeutic Modalities:   Motivational Interviewing Activity  Carloyn Jaeger Grossman-Orr  8:27 AM

## 2019-11-03 NOTE — Progress Notes (Signed)
Pt discharged to lobby. Pt was stable and appreciative at that time. All papers and samples were given and valuables returned. Verbal understanding expressed. Denies SI/HI and A/VH. Pt given opportunity to express concerns and ask questions. 

## 2019-11-03 NOTE — Progress Notes (Signed)
[place] NOVEL CORONAVIRUS (COVID-19) DAILY CHECK-OFF SYMPTOMS - answer yes or no to each - every day NO YES  Have you had a fever in the past 24 hours?  . Fever (Temp > 37.80C / 100F) X   Have you had any of these symptoms in the past 24 hours? . New Cough .  Sore Throat  .  Shortness of Breath .  Difficulty Breathing .  Unexplained Body Aches   X   Have you had any one of these symptoms in the past 24 hours not related to allergies?   . Runny Nose .  Nasal Congestion .  Sneezing   X   If you have had runny nose, nasal congestion, sneezing in the past 24 hours, has it worsened?  X   EXPOSURES - check yes or no X   Have you traveled outside the state in the past 14 days?  X   Have you been in contact with someone with a confirmed diagnosis of COVID-19 or PUI in the past 14 days without wearing appropriate PPE?  X   Have you been living in the same home as a person with confirmed diagnosis of COVID-19 or a PUI (household contact)?    X   Have you been diagnosed with COVID-19?    X              What to do next: Answered NO to all: Answered YES to anything:   Proceed with unit schedule Follow the [facility] Inpatient Flowsheet.   

## 2019-11-03 NOTE — Progress Notes (Signed)
  [facility] [place] Adult Case Management Discharge Plan :  Will you be returning to the same living situation after discharge:  Yes,  home at least until he calls to follow up with rehabs, if he so chooses At discharge, do you have transportation home?: Yes,  per patient no barriers Do you have the ability to pay for your medications: No.  Referred to local agency for help   Release of information consent forms completed and emailed to Medical Records, then turned in to Medical Records by CSW.   Patient to Follow up at: Follow-up Information    [facility], [facility]. Call.   Why: Referral for rehab has been done to this agency.  Call on a daily basis to follow up and obtain a bed. Contact information: [address] [place] [address] [phone]        [facility]. Call.   Why: Referral for rehab has been done to this agency.  Call on a daily basis to follow up and obtain a bed. Contact information: [address] [place] [address] [phone]        [facility] Follow up.   Why: This agency has a walk-in clinic Monday-Friday 8:00am-5:00pm.  Please feel free to call for an appointment in advance or go at your convenience. Contact information: [address] [place] [address] [phone]           Next level of care provider has access to [facility] Link:no  Safety Planning and Suicide Prevention discussed: No.     Has patient been referred to the Quitline?: Patient refused referral  Patient has been referred for addiction treatment: Yes  Lynnell Chad, LCSW 11/03/2019, 9:50 AM

## 2019-11-03 NOTE — Progress Notes (Signed)
   11/03/19 0900  Psych Admission Type (Psych Patients Only)  Admission Status Voluntary  Psychosocial Assessment  Patient Complaints Anxiety  Eye Contact Fair  Facial Expression Flat  Affect Appropriate to circumstance  Speech Logical/coherent  Interaction Assertive  Motor Activity Other (Comment) (WDL)  Appearance/Hygiene Disheveled  Behavior Characteristics Cooperative;Appropriate to situation  Mood Anxious;Pleasant  Thought Process  Coherency WDL  Content WDL  Delusions None reported or observed  Perception WDL  Hallucination None reported or observed  Judgment Poor  Confusion None  Danger to Self  Current suicidal ideation? Denies  Danger to Others  Danger to Others None reported or observed

## 2019-11-03 NOTE — [facility] Suicide Risk Assessment (Addendum)
[facility] Discharge Suicide Risk Assessment   Principal Problem: Opioid dependence with opioid-induced mood disorder [facility]) Discharge Diagnoses: Principal Problem:   Opioid dependence with opioid-induced mood disorder ([place]) Active Problems:   MDD (major depressive disorder), recurrent severe, without psychosis ([place])   Opioid use disorder, severe, dependence ([place])   Suicidal ideation   Total Time spent with patient: 30 minutes  Musculoskeletal: Strength & Muscle Tone: within normal limits Gait & Station: normal Patient leans: N/A  Psychiatric Specialty Exam: Review of Systems denies headache, no chest pain, no shortness of breath, no nausea or vomiting, no diarrhea  Blood pressure 131/84, pulse (!) 110, temperature 98.9 F (37.2 C), temperature source Oral, resp. rate 16, height 6\' 1"  (1.854 m), weight 77.1 kg, SpO2 100 %.Body mass index is 22.43 kg/m.  General Appearance: Improving grooming  Eye Contact::  Good  Speech:  Normal Rate409  Volume:  Normal  Mood:  Improving mood, states "I feel a lot better"  Affect:  Toy Cookey range of affect, no longer presenting with irritability, smiles and even laughs appropriately during session  Thought Process:  Linear and Descriptions of Associations: Intact  Orientation:  Full (Time, Place, and Person)  Thought Content:  Denies hallucinations, no delusions are expressed  Suicidal Thoughts:  No currently denies any suicidal or self-injurious ideations, denies homicidal or violent ideations  Homicidal Thoughts:  No  Memory:  Recent and remote grossly intact  Judgement:  Other:  Improving  Insight:  Fair/improving  Psychomotor Activity:  Normal-no psychomotor agitation or restlessness  Concentration:  Good  Recall:  Good  Fund of Knowledge:Good  Language: Good  Akathisia:  Negative  Handed:  Right  AIMS (if indicated):     Assets:  Desire for Improvement Resilience  Sleep:  Number of Hours: 6.75  Cognition: WNL  ADL's:  Intact   Mental  Status Per Nursing Assessment::   On Admission:  NA  Demographic Factors:  33, homeless  Loss Factors: Substance abuse   Historical Factors: History of opiate use disorder, history of depression  Risk Reduction Factors:   Positive coping skills or problem solving skills  Continued Clinical Symptoms:  Today patient presents alert, attentive, calm, improved grooming, improved eye contact, normal speech, mood improved and today euthymic, affect appropriate, more reactive, no longer irritable or dysphoric.  No thought disorder.  Denies suicidal or self-injurious ideations.  No hallucinations, no delusions, not internally preoccupied, denies SI or HI.  Oriented x3. Behavior on unit in good control-has been more visible on unit, going to some groups. Currently denies significant opiate withdrawal symptoms. Denies medication side effects, tolerating Cymbalta trial well thus far. Patient is requesting discharge today.  States "I am a lot better".  Denies any cravings for opiates at this time and states he intends to maintain abstinence/sobriety.  Reports he plans to avoid people, places and situations he associates with drug use to help maintain sobriety.  He states he plans to go to a rehab next week.  Cognitive Features That Contribute To Risk:  No gross cognitive deficits noted upon discharge. Is alert , attentive, and oriented x 3   Suicide Risk:  Mild:  Suicidal ideation of limited frequency, intensity, duration, and specificity.  There are no identifiable plans, no associated intent, mild dysphoria and related symptoms, good self-control (both objective and subjective assessment), few other risk factors, and identifiable protective factors, including available and accessible social support.  Follow-up Information    Services, [facility]. Call.   Why: Referral for rehab has  been done to this agency.  Call on a daily basis to follow up and obtain a bed. Contact information: [address] [place] [address] [phone]        Center, [facility]. Call.   Why: Referral for rehab has been done to this agency.  Call on a daily basis to follow up and obtain a bed. Contact information: [address] [place] [address] [phone]           Plan Of Care/Follow-up recommendations:  Activity:  As tolerated Diet:  Regular Tests:  NA Other:  See below Patient is expressing readiness for discharge/requesting discharge today.  At this time there are no grounds for any involuntary commitment.  He is leaving unit in good spirits.  He states he plans to stay with a friend who offers a sober setting.  He expresses motivation in sobriety and states he plans to avoid people, places, things he associates with drug use.  He states he plans to go to a rehab next week and has been given referrals for [facility]/ [facility] We also reviewed hep C (+) status-we will refer to [facility] wellness clinic for ongoing management. Craige Cotta, MD 11/03/2019, 9:41 AM

## 2019-11-03 NOTE — Discharge Summary (Addendum)
Physician Discharge Summary Note  Patient:  Jon Castro is an 34 y.o., male MRN:  [id number] DOB:  1985/10/04 Patient phone:  [phone] (home)  Patient address:   [address] [place] [address],  Total Time spent with patient: 15 minutes  Date of Admission:  10/30/2019 Date of Discharge: 11/03/2019  Reason for Admission:  Heroin dependence with suicidal ideation  Principal Problem: Opioid dependence with opioid-induced mood disorder [facility]) Discharge Diagnoses: Principal Problem:   Opioid dependence with opioid-induced mood disorder ([facility]) Active Problems:   MDD (major depressive disorder), recurrent severe, without psychosis ([facility])   Opioid use disorder, severe, dependence ([facility])   Suicidal ideation   Past Psychiatric History: MDD, Opiate induced mood disorder with use disorder. Last inpatient admission at [facility] June 2020. Substance abuse treatment at [facility].  Past Medical History:  Past Medical History:  Diagnosis Date  . Back pain   . Drug abuse ([facility])   . IV drug abuse ([facility])   . IVDU (intravenous drug user)   . LFTs abnormal     Past Surgical History:  Procedure Laterality Date  . I & D EXTREMITY Right 04/28/2019   Procedure: IRRIGATION AND DEBRIDEMENT ARM ABSCESS;  Surgeon: Sheral Apley, MD;  Location: [facility] OR;  Service: Orthopedics;  Laterality: Right;  . TEE WITHOUT CARDIOVERSION N/A 05/03/2019   Procedure: TRANSESOPHAGEAL ECHOCARDIOGRAM (TEE);  Surgeon: Jake Bathe, MD;  Location: [facility] ENDOSCOPY;  Service: Cardiovascular;  Laterality: N/A;   Family History: History reviewed. No pertinent family history. Family Psychiatric  History: Denies Social History:  Social History   Substance and Sexual Activity  Alcohol Use Yes     Social History   Substance and Sexual Activity  Drug Use Yes  . Types: IV, Cocaine, Amphetamines, Marijuana, Heroin, Methamphetamines    Social History   Socioeconomic History  . Marital status: Unknown    Spouse name: Not on  file  . Number of children: Not on file  . Years of education: Not on file  . Highest education level: Not on file  Occupational History  . Not on file  Tobacco Use  . Smoking status: Current Every Day Smoker  . Smokeless tobacco: Current User  Substance and Sexual Activity  . Alcohol use: Yes  . Drug use: Yes    Types: IV, Cocaine, Amphetamines, Marijuana, Heroin, Methamphetamines  . Sexual activity: Not on file  Other Topics Concern  . Not on file  Social History Narrative   ** Merged History Encounter **       Social Determinants of Health   Financial Resource Strain:   . Difficulty of Paying Living Expenses: Not on file  Food Insecurity:   . Worried About Programme researcher, broadcasting/film/video in the Last Year: Not on file  . Ran Out of Food in the Last Year: Not on file  Transportation Needs:   . Lack of Transportation (Medical): Not on file  . Lack of Transportation (Non-Medical): Not on file  Physical Activity:   . Days of Exercise per Week: Not on file  . Minutes of Exercise per Session: Not on file  Stress:   . Feeling of Stress : Not on file  Social Connections:   . Frequency of Communication with Friends and Family: Not on file  . Frequency of Social Gatherings with Friends and Family: Not on file  . Attends Religious Services: Not on file  . Active Member of Clubs or Organizations: Not on file  . Attends Banker Meetings:  Not on file  . Marital Status: Not on file    Hospital Course:  From admission H&P: 34 year old male, currently homeless, reports he has been staying with friends. He presented to ED yesterday expressing suicidal thoughts of overdosing and requesting assistance for substance abuse. Today patient presents alert, attentive, irritable/dysphoric and limited historian at this time, answering most questions with monosyllables or short phrases. He reports he has been using heroin (IVDA) daily "for a long time".  He reports she has been feeling more  depressed recently and endorses recent suicidal thoughts with thoughts of overdosing.  Endorses neurovegetative symptoms to include anhedonia, poor sleep, poor appetite, low energy level.  Denies hallucinations and no psychotic symptoms are noted. Admission BAL negative, UDS positive for amphetamines, opiates, cannabis. Patient is known to [facility] from past psychiatric admission in June 2020, at which time presented for depression/opiate (heroin) use disorder.  At that time was discharged on Vistaril and Trazodone PRN.  Currently denies past history of suicide attempts, denies history of psychosis, no history of mania or hypomania. Reports long history of substance abuse, identifies opiates/heroin as substance of choice. Currently denies medical illnesses.  Vicodin, tramadol, codeine are listed as allergies.  He was not taking any prescribed medications prior to admission. Labs reviewed-BMP unremarkable, AST 67, ALT 91 ( Hep C (+).  WBC 11, hemoglobin 13.2, platelet 285K.  Serum glucose 93.  Jon Castro was admitted for heroin dependence with suicidal ideation. He remained on the [facility] unit for four days. He was started on clonidine COWS protocol for opioid withdrawal. Cymbalta was started. He participated in group therapy on the unit. He responded well to treatment with no adverse effects reported. He has shown stable mood, affect, sleep, and interaction. He had requested referrals to rehab on admission, and appropriate referrals were made. He changed his mind during hospitalization and requested discharge home. He was encouraged to consider rehab but states he prefers to return home at this time. He denies any SI/HI/AVH and contracts for safety. He denies withdrawal symptoms. He is discharging on the medications listed below. He agrees to follow up at [facility] as well as [facility] for hepatitis C (see below). Patient is provided with prescriptions and medication samples upon discharge. His friend is  picking him up for discharge home.  Physical Findings: AIMS: Facial and Oral Movements Muscles of Facial Expression: None, normal Lips and Perioral Area: None, normal Jaw: None, normal Tongue: None, normal,Extremity Movements Upper (arms, wrists, hands, fingers): None, normal Lower (legs, knees, ankles, toes): None, normal, Trunk Movements Neck, shoulders, hips: None, normal, Overall Severity Severity of abnormal movements (highest score from questions above): None, normal Incapacitation due to abnormal movements: None, normal Patient's awareness of abnormal movements (rate only patient's report): No Awareness, Dental Status Current problems with teeth and/or dentures?: No Does patient usually wear dentures?: No  CIWA:    COWS:  COWS Total Score: 4  Musculoskeletal: Strength & Muscle Tone: within normal limits Gait & Station: normal Patient leans: N/A  Psychiatric Specialty Exam: Physical Exam  Nursing note and vitals reviewed. Constitutional: He is oriented to person, place, and time. He appears well-developed and well-nourished.  Cardiovascular: Normal rate.  Respiratory: Effort normal.  Neurological: He is alert and oriented to person, place, and time.    Review of Systems  Constitutional: Negative.   Respiratory: Negative for cough and shortness of breath.   Cardiovascular: Negative for chest pain.  Gastrointestinal: Negative for diarrhea, nausea and vomiting.  Neurological: Negative for  tremors and headaches.  Psychiatric/Behavioral: Negative for agitation, behavioral problems, confusion, dysphoric mood, hallucinations, self-injury, sleep disturbance and suicidal ideas. The patient is not nervous/anxious and is not hyperactive.     Blood pressure 120/84. Heart rate 87. Respirations 18. Temperature 98.9 oral.   See MD's discharge SRA      Has this patient used any form of tobacco in the last 30 days? (Cigarettes, Smokeless Tobacco, Cigars, and/or Pipes) Yes, a  prescription for an FDA-approved medication for tobacco cessation was offered at discharge.   Blood Alcohol level:  Lab Results  Component Value Date   ETH <10 10/30/2019   ETH <10 05/30/2019    Metabolic Disorder Labs:  Lab Results  Component Value Date   HGBA1C 5.8 (H) 04/29/2019   MPG 119.76 04/29/2019   No results found for: PROLACTIN No results found for: CHOL, TRIG, HDL, CHOLHDL, VLDL, LDLCALC  See Psychiatric Specialty Exam and Suicide Risk Assessment completed by Attending Physician prior to discharge.  Discharge destination:  Home  Is patient on multiple antipsychotic therapies at discharge:  No   Has Patient had three or more failed trials of antipsychotic monotherapy by history:  No  Recommended Plan for Multiple Antipsychotic Therapies: NA  Discharge Instructions    Discharge instructions   Complete by: As directed    Patient is instructed to take all prescribed medications as recommended. Report any side effects or adverse reactions to your outpatient psychiatrist. Patient is instructed to abstain from alcohol and illegal drugs while on prescription medications. In the event of worsening symptoms, patient is instructed to call the crisis hotline, 911, or go to the nearest emergency department for evaluation and treatment.     Allergies as of 11/03/2019      Reactions   Vicodin [hydrocodone-acetaminophen] Itching   Codeine Nausea And Vomiting   Codeine    Tramadol Other (See Comments)   "messes with head"   Tramadol    Vicodin [hydrocodone-acetaminophen]       Medication List    TAKE these medications     Indication  DULoxetine 30 MG capsule Commonly known as: CYMBALTA Take 1 capsule (30 mg total) by mouth daily. Start taking on: November 04, 2019  Indication: Major Depressive Disorder   nicotine 14 mg/24hr patch Commonly known as: NICODERM CQ - dosed in mg/24 hours Place 1 patch (14 mg total) onto the skin daily. Start taking on: November 04, 2019   Indication: Nicotine Addiction      Follow-up Information    Services, [facility]. Call.   Why: Referral for rehab has been done to this agency.  Call on a daily basis to follow up and obtain a bed. Contact information: [address] [place] [address] [phone]        [facility]. Call.   Why: Referral for rehab has been done to this agency.  Call on a daily basis to follow up and obtain a bed. Contact information: [address] [place] [address] [phone]        [facility] Follow up.   Why: This agency has a walk-in clinic Monday-Friday 8:00am-5:00pm.  Please feel free to call for an appointment in advance or go at your convenience. Contact information: [address] [place] [address] [phone]        [facility] Follow up.   Why: Primary care clinic for low-income/uninsured. Please call to schedule follow up for hepatitis C. Contact  information: [address] [place] [address] [phone]          Follow-up recommendations: Activity as tolerated. Diet as recommended by primary care physician. Keep all scheduled follow-up appointments as recommended.  Comments:   Patient is instructed to take all prescribed medications as recommended. Report any side effects or adverse reactions to your outpatient psychiatrist. Patient is instructed to abstain from alcohol and illegal drugs while on prescription medications. In the event of worsening symptoms, patient is instructed to call the crisis hotline, 911, or go to the nearest emergency department for evaluation and treatment.  Signed: Aldean Baker, NP 11/03/2019, 10:25 AM   Patient seen, Suicide Assessment Completed.  Disposition Plan Reviewed

## 2019-11-12 ENCOUNTER — Ambulatory Visit: Payer: Self-pay | Admitting: Infectious Diseases

## 2019-11-26 ENCOUNTER — Emergency Department (HOSPITAL_COMMUNITY)
Admission: EM | Admit: 2019-11-26 | Discharge: 2019-11-26 | Disposition: A | Discharge status: Home / Self Care | Payer: Self-pay | Attending: Emergency Medicine | Admitting: Emergency Medicine

## 2019-11-26 ENCOUNTER — Encounter (HOSPITAL_COMMUNITY): Payer: Self-pay | Admitting: Emergency Medicine

## 2019-11-26 ENCOUNTER — Other Ambulatory Visit: Payer: Self-pay

## 2019-11-26 DIAGNOSIS — H538 Other visual disturbances: Secondary | ICD-10-CM | POA: Insufficient documentation

## 2019-11-26 DIAGNOSIS — F17228 Nicotine dependence, chewing tobacco, with other nicotine-induced disorders: Secondary | ICD-10-CM | POA: Insufficient documentation

## 2019-11-26 DIAGNOSIS — F1510 Other stimulant abuse, uncomplicated: Secondary | ICD-10-CM | POA: Insufficient documentation

## 2019-11-26 DIAGNOSIS — F17200 Nicotine dependence, unspecified, uncomplicated: Secondary | ICD-10-CM | POA: Insufficient documentation

## 2019-11-26 DIAGNOSIS — M25562 Pain in left knee: Secondary | ICD-10-CM | POA: Insufficient documentation

## 2019-11-26 DIAGNOSIS — M25512 Pain in left shoulder: Secondary | ICD-10-CM | POA: Insufficient documentation

## 2019-11-26 DIAGNOSIS — F1110 Opioid abuse, uncomplicated: Secondary | ICD-10-CM | POA: Insufficient documentation

## 2019-11-26 DIAGNOSIS — F1910 Other psychoactive substance abuse, uncomplicated: Secondary | ICD-10-CM | POA: Insufficient documentation

## 2019-11-26 DIAGNOSIS — M25561 Pain in right knee: Secondary | ICD-10-CM | POA: Insufficient documentation

## 2019-11-26 DIAGNOSIS — M25511 Pain in right shoulder: Secondary | ICD-10-CM | POA: Insufficient documentation

## 2019-11-26 DIAGNOSIS — F1210 Cannabis abuse, uncomplicated: Secondary | ICD-10-CM | POA: Insufficient documentation

## 2019-11-26 DIAGNOSIS — F1410 Cocaine abuse, uncomplicated: Secondary | ICD-10-CM | POA: Insufficient documentation

## 2019-11-26 LAB — RPR: RPR Ser Ql: NONREACTIVE

## 2019-11-26 MED ORDER — TETRACAINE HCL 0.5 % OP SOLN
2.0000 [drp] | Freq: Once | OPHTHALMIC | Status: AC
  Administered 2019-11-26: 2 [drp] via OPHTHALMIC
  Filled 2019-11-26: qty 4

## 2019-11-26 MED ORDER — FLUORESCEIN SODIUM 1 MG OP STRP
1.0000 | ORAL_STRIP | Freq: Once | OPHTHALMIC | Status: AC
  Administered 2019-11-26: 1 via OPHTHALMIC
  Filled 2019-11-26: qty 1

## 2019-11-26 NOTE — ED Notes (Signed)
ED Provider at bedside. 

## 2019-11-26 NOTE — ED Provider Notes (Signed)
[facility] EMERGENCY DEPARTMENT Provider Note   CSN: [id number] Arrival date & time: 11/26/19  7782     History Chief Complaint  Patient presents with  . Stye    Jon Castro is a 34 y.o. male with a history of IV drug use, fungemia, bacteremia, and depression who presents to the emergency department with a chief complaint of left eye pain for the last 2 weeks.  The patient reports that 2 weeks ago that he developed blurred vision accompanied by redness, pain, and a small amount of purulent drainage from the left eye.  He thought he had pink eye. However, the redness resolved about a week ago.  He reports that the vision in his left eye has continued to be blurred and "foggy".  He also reports that he is now seeing "blacky squiggly lines" in his left visual field. States he is concerned because he has had two friends lose vision after developing similar symptoms.  No treatment prior to arrival.  He denies fever, chills, chest pain, cough, shortness of breath, headache, otalgia, nasal congestion, rhinorrhea, sore throat, neck pain or stiffness.  No loss of vision or loss of visual fields in the bilateral eyes.  He does report that he has had some pain in his bilateral shoulders and knees for several weeks, but this has not been worsening.  No redness or warmth to the joints.  He does not wear glasses or contacts.  He uses IV heroin.  He last used earlier today.  The history is provided by the patient. No language interpreter was used.       Past Medical History:  Diagnosis Date  . Back pain   . Drug abuse (HCC)   . IV drug abuse (HCC)   . IVDU (intravenous drug user)   . LFTs abnormal     Patient Active Problem List   Diagnosis Date Noted  . MDD (major depressive disorder), recurrent severe, without psychosis (HCC) 10/30/2019  . Suicidal ideation 10/30/2019  . Cellulitis 10/13/2019  . Overdose of heroin, accidental or unintentional, initial encounter  (HCC) 05/31/2019  . Fungemia 05/02/2019  . Blood culture positive for Candida species 05/02/2019  . MRSA bacteremia 04/29/2019  . IVDU (intravenous drug user) 04/29/2019  . Abscess of right forearm 04/28/2019  . Hyperglycemia 04/28/2019  . Leucocytosis 04/28/2019  . LFTs abnormal 04/28/2019  . Opioid use disorder, severe, dependence (HCC) 03/05/2019  . Opioid dependence with opioid-induced mood disorder (HCC)   . MDD (major depressive disorder), severe (HCC) 02/07/2019    Past Surgical History:  Procedure Laterality Date  . I & D EXTREMITY Right 04/28/2019   Procedure: IRRIGATION AND DEBRIDEMENT ARM ABSCESS;  Surgeon: Sheral Apley, MD;  Location: [facility] OR;  Service: Orthopedics;  Laterality: Right;  . TEE WITHOUT CARDIOVERSION N/A 05/03/2019   Procedure: TRANSESOPHAGEAL ECHOCARDIOGRAM (TEE);  Surgeon: Jake Bathe, MD;  Location: [facility] ENDOSCOPY;  Service: Cardiovascular;  Laterality: N/A;       No family history on file.  Social History   Tobacco Use  . Smoking status: Current Every Day Smoker  . Smokeless tobacco: Current User  Substance Use Topics  . Alcohol use: Yes  . Drug use: Yes    Types: IV, Cocaine, Amphetamines, Marijuana, Heroin, Methamphetamines    Comment: HEROIN    Home Medications Prior to Admission medications   Medication Sig Start Date End Date Taking? Authorizing Provider  DULoxetine (CYMBALTA) 30 MG capsule Take 1 capsule (30 mg total) by  mouth daily. 11/04/19   Connye Burkitt, NP  nicotine (NICODERM CQ - DOSED IN MG/24 HOURS) 14 mg/24hr patch Place 1 patch (14 mg total) onto the skin daily. 11/04/19   Connye Burkitt, NP  cloNIDine (CATAPRES) 0.1 MG tablet Take 1 tablet (0.1 mg total) by mouth 3 (three) times daily for 5 days. Patient not taking: Reported on 03/24/2019 03/08/19 03/24/19  Lennice Sites, DO  traZODone (DESYREL) 50 MG tablet Take 1 tablet (50 mg total) by mouth at bedtime as needed for sleep. Patient not taking: Reported on 02/21/2019 02/11/19  03/24/19  Connye Burkitt, NP    Allergies    Vicodin [hydrocodone-acetaminophen], Codeine, Codeine, Tramadol, Tramadol, and Vicodin [hydrocodone-acetaminophen]  Review of Systems   Review of Systems  Constitutional: Negative for chills and fever.  HENT: Negative for congestion, facial swelling, sinus pressure, sinus pain, sore throat and voice change.   Eyes: Positive for pain, discharge, redness (resolved) and visual disturbance.  Respiratory: Negative for cough, shortness of breath and wheezing.   Gastrointestinal: Negative for diarrhea, nausea and vomiting.  Genitourinary: Negative for discharge and penile pain.  Musculoskeletal: Negative for neck pain and neck stiffness.  Neurological: Negative for dizziness, seizures, syncope, weakness, numbness and headaches.    Physical Exam Updated Vital Signs BP 119/79   Pulse 72   Temp 98.6 F (37 C) (Oral)   Resp 18   Ht 6\' 1"  (1.854 m)   Wt 80 kg   SpO2 100%   BMI 23.27 kg/m   Physical Exam Vitals and nursing note reviewed.  Constitutional:      General: He is not in acute distress.    Appearance: He is well-developed. He is not ill-appearing, toxic-appearing or diaphoretic.     Comments: Well-appearing.  No acute distress.  HENT:     Head: Normocephalic.  Eyes:     General: Vision grossly intact.        Right eye: No foreign body or discharge.        Left eye: No foreign body or discharge.     Intraocular pressure: Right eye pressure is 11 mmHg. Left eye pressure is 7 mmHg.     Extraocular Movements:     Right eye: Normal extraocular motion and no nystagmus.     Left eye: Normal extraocular motion and no nystagmus.     Conjunctiva/sclera: Conjunctivae normal.     Right eye: Right conjunctiva is not injected. No chemosis or exudate.    Left eye: Left conjunctiva is not injected. No chemosis or exudate.    Pupils: Pupils are equal, round, and reactive to light.     Right eye: No corneal abrasion or fluorescein uptake.  Seidel exam negative.     Left eye: No corneal abrasion or fluorescein uptake. Seidel exam negative.    Funduscopic exam:    Right eye: No hemorrhage or exudate.        Left eye: No hemorrhage or exudate.     Slit lamp exam:    Right eye: No corneal ulcer, foreign body, hypopyon or photophobia.     Left eye: No corneal ulcer, foreign body, hypopyon or photophobia.     Visual Fields: Right eye visual fields normal and left eye visual fields normal.     Comments: Able to finger count bilaterally.  No obvious funduscopic abnormalities on undilated eyes.  Cardiovascular:     Rate and Rhythm: Normal rate and regular rhythm.     Pulses: Normal pulses.  Heart sounds: Normal heart sounds. No murmur. No friction rub. No gallop.      Comments: No murmurs rubs or gallops. Pulmonary:     Effort: Pulmonary effort is normal. No respiratory distress.     Breath sounds: Normal breath sounds. No stridor. No wheezing, rhonchi or rales.  Chest:     Chest wall: No tenderness.  Abdominal:     General: There is no distension.     Palpations: Abdomen is soft.  Musculoskeletal:     Cervical back: Neck supple.     Comments: Normal active and passive range of motion to the bilateral shoulders, elbows, hips, knees, and ankles.  No overlying redness or warmth.  Neurovascular intact throughout the bilateral upper and lower extremities.  Skin:    General: Skin is warm and dry.  Neurological:     Mental Status: He is alert.  Psychiatric:        Behavior: Behavior normal.     ED Results / Procedures / Treatments   Labs (all labs ordered are listed, but only abnormal results are displayed) Labs Reviewed  RPR    EKG None  Radiology No results found.  Procedures Procedures (including critical care time)  Medications Ordered in ED Medications  fluorescein ophthalmic strip 1 strip (1 strip Both Eyes Given by Other 11/26/19 0624)  tetracaine (PONTOCAINE) 0.5 % ophthalmic solution 2 drop (2 drops  Both Eyes Given by Other 11/26/19 9622)    ED Course  I have reviewed the triage vital signs and the nursing notes.  Pertinent labs & imaging results that were available during my care of the patient were reviewed by me and considered in my medical decision making (see chart for details).    MDM Rules/Calculators/A&P                      34 year old male with a history of IV drug use, fungemia, bacteremia, and depression presenting with persistent blurred vision and pain to the left eye for the last 2 weeks.  He initially presented with left eye redness and purulent discharge that has since resolved.  No fever or chills.  He is also endorsing some arthralgias to the bilateral shoulders and knees, but there is no overlying redness or warmth on exam.  Range of motion is also intact.  Doubt septic arthritis or gout.  Visual acuity 20/32 on the right and 20/63 on left.  IOP recorded as 7 on the left and 11 on the right. However, the accuracy of the tonometer is questionable as readings are given very quickly.  The eye is not injected.  There is no fluorescein uptake. I low suspicion for bacterial endophthalmitis at this time.  Patient is also not having any other infectious complaints.  Doubt keratitis or uveitis. Doubt HSV lack of given dendritic lesions.  He is nontoxic and well-appearing.  Vital signs are normal in the ER.  The patient was discussed with Dr. Clayborne Dana, attending physician.   Given the patient's history of IV drug use, consulted ophthalmology.  Spoke with Dr. Alben Spittle, ophthalmology, who recommends performing RPR test in the ER.  If RPR is positive, patient will require admission and work-up for neurosyphilis.  If negative, the patient can follow-up with him in the office this afternoon.  He initially stated 12:15, but after speaking with microbiology in the lab with a batch RPR test results and do not run them until 10:30 and run for approximately 1.5 hours.  Discussed the length of  time and  will require for test to run in the ER and patient is agreeable to waiting.  Patient care transferred to PA Aberman at the end of my shift.  If RPR is negative, she may need to speak with Dr. Alben Spittle with ophthalmology to reschedule outpatient follow-up appointment given length of time it will take for test to run.   Patient presentation, ED course, and plan of care discussed with review of all pertinent labs and imaging. Please see his/her note for further details regarding further ED course and disposition.   Final Clinical Impression(s) / ED Diagnoses Final diagnoses:  None    Rx / DC Orders ED Discharge Orders    None       Barkley Boards, PA-C 11/26/19 0752    Mesner, Barbara Cower, MD 11/28/19 0028

## 2019-11-26 NOTE — ED Notes (Signed)
Patient verbalizes understanding of discharge instructions. Opportunity for questioning and answers were provided. Armband removed by staff, pt discharged from ED.  

## 2019-11-26 NOTE — Discharge Instructions (Signed)
As discussed, your syphilis test was negative.  I have spoken to Dr. Alben Spittle the ophthalmologist who has scheduled you an appointment for tomorrow at 12:45 PM.  I have included the address and phone number of the office in your discharge notes.  Please call to reschedule if you are unable to make the appointment.  Return to the ER for new or worsening symptoms.

## 2019-11-26 NOTE — ED Triage Notes (Signed)
Patient reports stye at right eyelid onset 3 days ago , he added " foggy/black lines" at left eye for 2 weeks , denies eye injury / no vision loss.

## 2019-11-26 NOTE — ED Notes (Signed)
Breakfast tray ordered for patient.

## 2019-11-26 NOTE — ED Provider Notes (Signed)
Care assumed from [facility], PA-C at shift change. See her note for full HPI.  In short, patient is a 34 year old male with a past medical history significant for IV drug use with hx of fungemia and bactermia presents to the ED due to left eye pain x2 weeks.  Left eye pain is associated with blurred vision, erythema, and purulent drainage.  She notes redness resolved about a week ago; however, his vision continue to be blurry.  He admits to "black squiggly lines" in left visual field.  Patient denies fever, chills, chest pain, cough, shortness of breath, headache, otalgia, nasal congestion, rhinorrhea, neck pain, or sore throat.  He also admits to intermittent pain in his bilateral shoulders and knees for numerous weeks which has remained consistent and not worsened.  Denies associated redness or warmth to joints.  Denies recent tick bites.  previous provider spoke to Dr. Alben Spittle with ophthalmology who recommended RPR.  If RPR is negative patient to be discharge for outpatient follow-up with Dr. Alben Spittle. If RPR gets back in time, patient has an appointment with Dr. Alben Spittle at 12:15. If RPR positive, patient will need to be admitted to the hospital for possible neurosyphilis.    Physical Exam  BP 109/66 (BP Location: Left Arm)   Pulse 72   Temp 98.6 F (37 C) (Oral)   Resp 18   Ht 6\' 1"  (1.854 m)   Wt 80 kg   SpO2 100%   BMI 23.27 kg/m   Physical Exam Vitals and nursing note reviewed.  Constitutional:      General: He is not in acute distress.    Appearance: He is not ill-appearing.  HENT:     Head: Normocephalic.  Eyes:     General:        Right eye: No discharge.        Left eye: No discharge.     Extraocular Movements: Extraocular movements intact.     Conjunctiva/sclera: Conjunctivae normal.     Pupils: Pupils are equal, round, and reactive to light.     Comments: Previous provider did full eye exam. See her note for details.   Cardiovascular:     Rate and Rhythm: Normal rate and  regular rhythm.     Pulses: Normal pulses.     Heart sounds: Normal heart sounds. No murmur. No friction rub. No gallop.   Pulmonary:     Effort: Pulmonary effort is normal.     Breath sounds: Normal breath sounds.  Abdominal:     General: Abdomen is flat. There is no distension.     Palpations: Abdomen is soft.     Tenderness: There is no abdominal tenderness. There is no guarding or rebound.  Musculoskeletal:     Cervical back: Neck supple.     Comments: Normal ROM of all joints. No erythema, edema, or warmth. All 4 extremities neurovascularly intact.   Skin:    General: Skin is warm and dry.  Neurological:     General: No focal deficit present.     Mental Status: He is alert.  Psychiatric:        Mood and Affect: Mood normal.        Behavior: Behavior normal.     ED Course/Procedures   Clinical Course as of Nov 26 1146  Tue Nov 26, 2019  1111 RPR: NON REACTIVE [CA]    Clinical Course User Index [CA] Nov 28, 2019, PA-C    Procedures  MDM  Care assumed from [facility],  PA-C at shift change. See her note for full MDM.   34 year old male with a history of IV drug use presents to the ED for evaluation of left eye pain with decreased visual acuity.  He also admits to some arthralgias to bilateral shoulders and knees.  Previous provider had no concern for septic arthritis or gout which I agree with. No history of recent tick bite. Full ROM of all joint without warmth, edema, or erythema. All extremities neurovascularly intact.   Visual acuity 20/32 on right and 20/63 on left.  IOP recorded as 7 on the left and 11 on the right per previous provider.  Questionable tonometer accuracy.  Obese provider performed for eye exam.  There is no fluorescein uptake or abnormalities on exam. Low suspicion for keratitis, uveitis, bacterial endophthalmitis. Patient is well appearing with no other infectious symptoms.   Previous provider spoke to Dr. Kathlen Mody with ophthalmology who  recommended performing RPR test here in the ER.  If RPR positive, patient will require admission and work-up for neurosyphilis.  Dr. Kathlen Mody noted if RPR is negative, patient can follow-up with him in the outpatient setting; however, RPR is run in batches at 1030 and typically takes 1.5 hours which may make his scheduled appointment difficult.  Will discuss with Dr. Kathlen Mody about scheduled appointment once RPR becomes available.   RPR negative. No concern for neurosyphilis at this time. Discussed results with Dr. Kathlen Mody. Patient has been scheduled an appointment for tomorrow at 12:45pm for further evaluation. Strict ED precautions discussed with patient. Patient states understanding and agrees to plan. Patient discharged home in no acute distress and stable vitals      Karie Kirks 11/26/19 [place], DO 11/26/19 1251

## 2019-11-26 NOTE — Progress Notes (Signed)
Spoke to patient about lack of PCP. Patient states he has no money and has been turned down for medicaid. He has no job and cannot find work. He states every time he comes to ED, people always say they can try to help, but never can. I will follow up with patient with any resources available.

## 2019-12-05 ENCOUNTER — Telehealth: Payer: Self-pay | Admitting: Internal Medicine

## 2019-12-08 ENCOUNTER — Emergency Department (HOSPITAL_COMMUNITY): Payer: Self-pay

## 2019-12-08 ENCOUNTER — Other Ambulatory Visit: Payer: Self-pay

## 2019-12-08 ENCOUNTER — Emergency Department (HOSPITAL_COMMUNITY)
Admission: EM | Admit: 2019-12-08 | Discharge: 2019-12-09 | Disposition: A | Discharge status: Home / Self Care | Payer: Self-pay | Attending: Emergency Medicine | Admitting: Emergency Medicine

## 2019-12-08 ENCOUNTER — Encounter (HOSPITAL_COMMUNITY): Payer: Self-pay

## 2019-12-08 DIAGNOSIS — F1990 Other psychoactive substance use, unspecified, uncomplicated: Secondary | ICD-10-CM

## 2019-12-08 DIAGNOSIS — L03113 Cellulitis of right upper limb: Secondary | ICD-10-CM | POA: Insufficient documentation

## 2019-12-08 DIAGNOSIS — Z8614 Personal history of Methicillin resistant Staphylococcus aureus infection: Secondary | ICD-10-CM | POA: Insufficient documentation

## 2019-12-08 DIAGNOSIS — F1110 Opioid abuse, uncomplicated: Secondary | ICD-10-CM | POA: Insufficient documentation

## 2019-12-08 DIAGNOSIS — F1410 Cocaine abuse, uncomplicated: Secondary | ICD-10-CM | POA: Insufficient documentation

## 2019-12-08 DIAGNOSIS — F17200 Nicotine dependence, unspecified, uncomplicated: Secondary | ICD-10-CM | POA: Insufficient documentation

## 2019-12-08 DIAGNOSIS — F1510 Other stimulant abuse, uncomplicated: Secondary | ICD-10-CM | POA: Insufficient documentation

## 2019-12-08 LAB — CBC WITH DIFFERENTIAL/PLATELET
Abs Immature Granulocytes: 0.05 10*3/uL (ref 0.00–0.07)
Basophils Absolute: 0.1 10*3/uL (ref 0.0–0.1)
Basophils Relative: 1 %
Eosinophils Absolute: 0.2 10*3/uL (ref 0.0–0.5)
Eosinophils Relative: 2 %
HCT: 35.4 % — ABNORMAL LOW (ref 39.0–52.0)
Hemoglobin: 11.4 g/dL — ABNORMAL LOW (ref 13.0–17.0)
Immature Granulocytes: 1 %
Lymphocytes Relative: 23 %
Lymphs Abs: 2.3 10*3/uL (ref 0.7–4.0)
MCH: 27.7 pg (ref 26.0–34.0)
MCHC: 32.2 g/dL (ref 30.0–36.0)
MCV: 86.1 fL (ref 80.0–100.0)
Monocytes Absolute: 0.9 10*3/uL (ref 0.1–1.0)
Monocytes Relative: 9 %
Neutro Abs: 6.7 10*3/uL (ref 1.7–7.7)
Neutrophils Relative %: 64 %
Platelets: 259 10*3/uL (ref 150–400)
RBC: 4.11 MIL/uL — ABNORMAL LOW (ref 4.22–5.81)
RDW: 13.8 % (ref 11.5–15.5)
WBC: 10.2 10*3/uL (ref 4.0–10.5)
nRBC: 0 % (ref 0.0–0.2)

## 2019-12-08 LAB — COMPREHENSIVE METABOLIC PANEL
ALT: 116 U/L — ABNORMAL HIGH (ref 0–44)
AST: 86 U/L — ABNORMAL HIGH (ref 15–41)
Albumin: 3.1 g/dL — ABNORMAL LOW (ref 3.5–5.0)
Alkaline Phosphatase: 77 U/L (ref 38–126)
Anion gap: 9 (ref 5–15)
BUN: 8 mg/dL (ref 6–20)
CO2: 22 mmol/L (ref 22–32)
Calcium: 8.1 mg/dL — ABNORMAL LOW (ref 8.9–10.3)
Chloride: 101 mmol/L (ref 98–111)
Creatinine, Ser: 0.76 mg/dL (ref 0.61–1.24)
GFR calc Af Amer: >60 mL/min (ref >60)
GFR calc non Af Amer: >60 mL/min (ref >60)
Glucose, Bld: 102 mg/dL — ABNORMAL HIGH (ref 70–99)
Potassium: 3.3 mmol/L — ABNORMAL LOW (ref 3.5–5.1)
Sodium: 132 mmol/L — ABNORMAL LOW (ref 135–145)
Total Bilirubin: 1 mg/dL (ref 0.3–1.2)
Total Protein: 6.4 g/dL — ABNORMAL LOW (ref 6.5–8.1)

## 2019-12-08 LAB — URINALYSIS, ROUTINE W REFLEX MICROSCOPIC
Bilirubin Urine: NEGATIVE
Glucose, UA: NEGATIVE mg/dL
Hgb urine dipstick: NEGATIVE
Ketones, ur: NEGATIVE mg/dL
Nitrite: NEGATIVE
Protein, ur: 30 mg/dL — AB
Specific Gravity, Urine: 1.03 (ref 1.005–1.030)
pH: 5 (ref 5.0–8.0)

## 2019-12-08 LAB — LACTIC ACID, PLASMA: Lactic Acid, Venous: 1.8 mmol/L (ref 0.5–1.9)

## 2019-12-08 MED ORDER — SODIUM CHLORIDE 0.9% FLUSH: 3.0000 mL | Freq: Once | INTRAVENOUS | Status: DC

## 2019-12-08 NOTE — ED Triage Notes (Signed)
Pt reports right forearm and hand swollen from "missing" when shooting heroin 4 days ago.  Last used heroin 2 days ago.

## 2019-12-09 ENCOUNTER — Emergency Department (HOSPITAL_COMMUNITY): Payer: Self-pay

## 2019-12-09 LAB — LACTIC ACID, PLASMA: Lactic Acid, Venous: 0.9 mmol/L (ref 0.5–1.9)

## 2019-12-09 MED ORDER — SODIUM CHLORIDE 0.9 % IV BOLUS
1000.0000 mL | Freq: Once | INTRAVENOUS | Status: AC
  Administered 2019-12-09: 1000 mL via INTRAVENOUS

## 2019-12-09 MED ORDER — SODIUM CHLORIDE 0.9 % IV SOLN
1.0000 g | Freq: Once | INTRAVENOUS | Status: AC
  Administered 2019-12-09: 1 g via INTRAVENOUS
  Filled 2019-12-09: qty 10

## 2019-12-09 MED ORDER — VANCOMYCIN HCL 1500 MG/300ML IV SOLN
1500.0000 mg | Freq: Once | INTRAVENOUS | Status: AC
  Administered 2019-12-09: 1500 mg via INTRAVENOUS
  Filled 2019-12-09: qty 300

## 2019-12-09 MED ORDER — KETOROLAC TROMETHAMINE 30 MG/ML IJ SOLN
15.0000 mg | Freq: Once | INTRAMUSCULAR | Status: AC
  Administered 2019-12-09: 15 mg via INTRAVENOUS
  Filled 2019-12-09: qty 1

## 2019-12-09 MED ORDER — SULFAMETHOXAZOLE-TRIMETHOPRIM 800-160 MG PO TABS: 1.0000 | ORAL_TABLET | Freq: Two times a day (BID) | ORAL | 0 refills | Status: AC

## 2019-12-09 NOTE — ED Notes (Signed)
Pt was given a sandwich and a water.

## 2019-12-09 NOTE — ED Provider Notes (Signed)
[facility] EMERGENCY DEPARTMENT Provider Note   CSN: [id number] Arrival date & time: 12/08/19  1839     History No chief complaint on file.   Jon Castro is a 34 y.o. male.   34 y/o male with hx of IVDU, R forearm abscess complicated by MRSA bacteremia/Candida glabrata bacteremia in 03/2019 (subsequent 3 week hospital stay) to the emergency department for cellulitis right forearm and hand.  Has noted redness and swelling over the past 4 days.  States that he was trying to use IV heroin, but "missed".  Reports pain in the extremity.  No OTC medications taken for pain.  Is unaware of any fever PTA.  No associated numbness or weakness.  Last used heroin 2 days ago.  The history is provided by the patient. No language interpreter was used.       Past Medical History:  Diagnosis Date  . Back pain   . Drug abuse ([place])   . IV drug abuse ([place])   . IVDU (intravenous drug user)   . LFTs abnormal     Patient Active Problem List   Diagnosis Date Noted  . MDD (major depressive disorder), recurrent severe, without psychosis ([place]) 10/30/2019  . Suicidal ideation 10/30/2019  . Cellulitis 10/13/2019  . Overdose of heroin, accidental or unintentional, initial encounter ([place]) 05/31/2019  . Fungemia 05/02/2019  . Blood culture positive for Candida species 05/02/2019  . MRSA bacteremia 04/29/2019  . IVDU (intravenous drug user) 04/29/2019  . Abscess of right forearm 04/28/2019  . Hyperglycemia 04/28/2019  . Leucocytosis 04/28/2019  . LFTs abnormal 04/28/2019  . Opioid use disorder, severe, dependence ([place]) 03/05/2019  . Opioid dependence with opioid-induced mood disorder ([place])   . MDD (major depressive disorder), severe ([place]) 02/07/2019    Past Surgical History:  Procedure Laterality Date  . I & D EXTREMITY Right 04/28/2019   Procedure: IRRIGATION AND DEBRIDEMENT ARM ABSCESS;  Surgeon: Renette Butters, MD;  Location: [facility];  Service: Orthopedics;   Laterality: Right;  . TEE WITHOUT CARDIOVERSION N/A 05/03/2019   Procedure: TRANSESOPHAGEAL ECHOCARDIOGRAM (TEE);  Surgeon: Jerline Pain, MD;  Location: [facility] ENDOSCOPY;  Service: Cardiovascular;  Laterality: N/A;       History reviewed. No pertinent family history.  Social History   Tobacco Use  . Smoking status: Current Every Day Smoker  . Smokeless tobacco: Current User  Substance Use Topics  . Alcohol use: Yes  . Drug use: Yes    Types: IV, Cocaine, Amphetamines, Marijuana, Heroin, Methamphetamines    Comment: HEROIN    Home Medications Prior to Admission medications   Medication Sig Start Date End Date Taking? Authorizing Provider  DULoxetine (CYMBALTA) 30 MG capsule Take 1 capsule (30 mg total) by mouth daily. 11/04/19   Connye Burkitt, NP  nicotine (NICODERM CQ - DOSED IN MG/24 HOURS) 14 mg/24hr patch Place 1 patch (14 mg total) onto the skin daily. 11/04/19   Connye Burkitt, NP  sulfamethoxazole-trimethoprim (BACTRIM DS) 800-160 MG tablet Take 1 tablet by mouth 2 (two) times daily for 7 days. 12/09/19 12/16/19  Antonietta Breach, PA-C  cloNIDine (CATAPRES) 0.1 MG tablet Take 1 tablet (0.1 mg total) by mouth 3 (three) times daily for 5 days. Patient not taking: Reported on 03/24/2019 03/08/19 03/24/19  Lennice Sites, DO  traZODone (DESYREL) 50 MG tablet Take 1 tablet (50 mg total) by mouth at bedtime as needed for sleep. Patient not taking: Reported on 02/21/2019 02/11/19 03/24/19  Connye Burkitt, NP  Allergies    Vicodin [hydrocodone-acetaminophen], Codeine, Codeine, Tramadol, Tramadol, and Vicodin [hydrocodone-acetaminophen]  Review of Systems   Review of Systems  Ten systems reviewed and are negative for acute change, except as noted in the HPI.    Physical Exam Updated Vital Signs BP 109/68   Pulse 69   Temp 98.2 F (36.8 C) (Oral)   Resp 18   Ht 6\' 1"  (1.854 m)   Wt 72.6 kg   SpO2 100%   BMI 21.11 kg/m   Physical Exam Vitals and nursing note reviewed.    Constitutional:      General: He is not in acute distress.    Appearance: He is well-developed. He is not diaphoretic.     Comments: Disheveled. Nontoxic.   HENT:     Head: Normocephalic and atraumatic.  Eyes:     General: No scleral icterus.    Conjunctiva/sclera: Conjunctivae normal.  Cardiovascular:     Rate and Rhythm: Normal rate and regular rhythm.     Pulses: Normal pulses.     Comments: Distal radial pulse 2+ in the RUE. Capillary refill brisk in all digits of the R hand. Pulmonary:     Effort: Pulmonary effort is normal. No respiratory distress.     Comments: Respirations even and unlabored Musculoskeletal:        General: Normal range of motion.     Cervical back: Normal range of motion.     Comments: Warmth, erythema, and edema to the R hand and forearm without frank abscess or lymphangitic streaking. No crepitus. Compartments of the RUE compressible.  Skin:    General: Skin is warm and dry.     Coloration: Skin is not pale.     Findings: No erythema or rash.     Comments: Track marks noted.  Neurological:     Mental Status: He is alert and oriented to person, place, and time.  Psychiatric:        Behavior: Behavior normal.     ED Results / Procedures / Treatments   Labs (all labs ordered are listed, but only abnormal results are displayed) Labs Reviewed  COMPREHENSIVE METABOLIC PANEL - Abnormal; Notable for the following components:      Result Value   Sodium 132 (*)    Potassium 3.3 (*)    Glucose, Bld 102 (*)    Calcium 8.1 (*)    Total Protein 6.4 (*)    Albumin 3.1 (*)    AST 86 (*)    ALT 116 (*)    All other components within normal limits  CBC WITH DIFFERENTIAL/PLATELET - Abnormal; Notable for the following components:   RBC 4.11 (*)    Hemoglobin 11.4 (*)    HCT 35.4 (*)    All other components within normal limits  URINALYSIS, ROUTINE W REFLEX MICROSCOPIC - Abnormal; Notable for the following components:   Protein, ur 30 (*)     Leukocytes,Ua TRACE (*)    Bacteria, UA RARE (*)    All other components within normal limits  LACTIC ACID, PLASMA  LACTIC ACID, PLASMA    EKG None  Radiology DG Chest 2 View  Result Date: 12/08/2019 CLINICAL DATA:  Arm redness/swelling EXAM: CHEST - 2 VIEW COMPARISON:  12/10/2014 FINDINGS: Lungs are clear.  No pleural effusion or pneumothorax. The heart is normal in size. Visualized osseous structures are within normal limits. IMPRESSION: Normal chest radiographs. Electronically Signed   By: 12/12/2014 M.D.   On: 12/08/2019 20:16   DG Forearm  Right  Result Date: 12/09/2019 CLINICAL DATA:  Cellulitis and swelling EXAM: RIGHT FOREARM - 2 VIEW COMPARISON:  None. FINDINGS: No fracture or malalignment. No radiopaque foreign body. Diffuse soft tissue swelling. IMPRESSION: No acute osseous abnormality Electronically Signed   By: Jasmine Pang M.D.   On: 12/09/2019 00:48    Procedures Procedures (including critical care time)  Medications Ordered in ED Medications  sodium chloride flush (NS) 0.9 % injection 3 mL (has no administration in time range)  cefTRIAXone (ROCEPHIN) 1 g in sodium chloride 0.9 % 100 mL IVPB (0 g Intravenous Stopped 12/09/19 0338)  ketorolac (TORADOL) 30 MG/ML injection 15 mg (15 mg Intravenous Given 12/09/19 0243)  vancomycin (VANCOREADY) IVPB 1500 mg/300 mL (0 mg Intravenous Stopped 12/09/19 0520)  sodium chloride 0.9 % bolus 1,000 mL (0 mLs Intravenous Stopped 12/09/19 0338)    ED Course  I have reviewed the triage vital signs and the nursing notes.  Pertinent labs & imaging results that were available during my care of the patient were reviewed by me and considered in my medical decision making (see chart for details).  Clinical Course as of Dec 09 530  Mon Dec 09, 2019  0105 No subcutaneous gas noted on plain films.  Patient receiving IV antibiotics.   [KH]    Clinical Course User Index [KH] Antony Madura, PA-C   MDM Rules/Calculators/A&P                       34 year old male with a history of IV drug use presents to the emergency department for complaints of swelling and redness to his right forearm and hand.  His physical exam is consistent with cellulitis.  There is no frank abscess or evidence of subcutaneous gas on x-ray.  He does not meet criteria for SIRS or sepsis.  Treated in the ED with IV antibiotics.  Plan for discharge on course of Bactrim which patient has taken previously for other episodes of similar complaints.  He has been encouraged to follow-up for wound recheck, but advised to return to the ED if symptoms worsen.  Patient discharged in stable condition.   Final Clinical Impression(s) / ED Diagnoses Final diagnoses:  Cellulitis of right arm  IVDU (intravenous drug user)    Rx / DC Orders ED Discharge Orders         Ordered    sulfamethoxazole-trimethoprim (BACTRIM DS) 800-160 MG tablet  2 times daily     12/09/19 0529           Antony Madura, PA-C 12/09/19 0533    Zadie Rhine, MD 12/09/19 [phone]

## 2019-12-09 NOTE — ED Notes (Signed)
The pt was fast asleep in the waiting room.  In the treatment room the pt refuses to remove his jacket  Cursing   A brief glimpse of his rt arm ??IV DRUG USER

## 2019-12-09 NOTE — ED Notes (Signed)
rude

## 2019-12-09 NOTE — Discharge Instructions (Signed)
Take Bactrim as prescribed for symptom management.  We recommend Tylenol or ibuprofen for pain.  Should you experience worsening redness, fever, numbness or tingling in the arm, pus draining from the arm, return for repeat evaluation.

## 2019-12-09 NOTE — ED Notes (Signed)
The pt is demanding in any conversation

## 2019-12-09 NOTE — ED Notes (Signed)
One unsuccessful stick  Iv team called

## 2020-01-01 ENCOUNTER — Emergency Department (HOSPITAL_COMMUNITY)
Admission: EM | Admit: 2020-01-01 | Discharge: 2020-01-03 | Disposition: A | Discharge status: Home / Self Care | Payer: Self-pay | Attending: Emergency Medicine | Admitting: Emergency Medicine

## 2020-01-01 ENCOUNTER — Other Ambulatory Visit: Payer: Self-pay

## 2020-01-01 DIAGNOSIS — R45851 Suicidal ideations: Secondary | ICD-10-CM | POA: Insufficient documentation

## 2020-01-01 DIAGNOSIS — Z20822 Contact with and (suspected) exposure to covid-19: Secondary | ICD-10-CM | POA: Insufficient documentation

## 2020-01-01 DIAGNOSIS — F329 Major depressive disorder, single episode, unspecified: Secondary | ICD-10-CM | POA: Insufficient documentation

## 2020-01-02 ENCOUNTER — Encounter (HOSPITAL_COMMUNITY): Payer: Self-pay | Admitting: Emergency Medicine

## 2020-01-02 ENCOUNTER — Other Ambulatory Visit: Payer: Self-pay

## 2020-01-02 LAB — RESPIRATORY PANEL BY RT PCR (FLU A&B, COVID)
Influenza A by PCR: NEGATIVE
Influenza B by PCR: NEGATIVE
SARS Coronavirus 2 by RT PCR: NEGATIVE

## 2020-01-02 LAB — RAPID URINE DRUG SCREEN, HOSP PERFORMED
Amphetamines: NOT DETECTED
Barbiturates: NOT DETECTED
Benzodiazepines: NOT DETECTED
Cocaine: POSITIVE — AB
Opiates: POSITIVE — AB
Tetrahydrocannabinol: POSITIVE — AB

## 2020-01-02 LAB — COMPREHENSIVE METABOLIC PANEL
ALT: 107 U/L — ABNORMAL HIGH (ref 0–44)
AST: 84 U/L — ABNORMAL HIGH (ref 15–41)
Albumin: 3.4 g/dL — ABNORMAL LOW (ref 3.5–5.0)
Alkaline Phosphatase: 87 U/L (ref 38–126)
Anion gap: 10 (ref 5–15)
BUN: 13 mg/dL (ref 6–20)
CO2: 27 mmol/L (ref 22–32)
Calcium: 9.1 mg/dL (ref 8.9–10.3)
Chloride: 110 mmol/L (ref 98–111)
Creatinine, Ser: 1 mg/dL (ref 0.61–1.24)
GFR calc Af Amer: >60 mL/min (ref >60)
GFR calc non Af Amer: >60 mL/min (ref >60)
Glucose, Bld: 112 mg/dL — ABNORMAL HIGH (ref 70–99)
Potassium: 3.9 mmol/L (ref 3.5–5.1)
Sodium: 147 mmol/L — ABNORMAL HIGH (ref 135–145)
Total Bilirubin: 0.5 mg/dL (ref 0.3–1.2)
Total Protein: 6.8 g/dL (ref 6.5–8.1)

## 2020-01-02 LAB — CBC
HCT: 41 % (ref 39.0–52.0)
Hemoglobin: 12.4 g/dL — ABNORMAL LOW (ref 13.0–17.0)
MCH: 27.3 pg (ref 26.0–34.0)
MCHC: 30.2 g/dL (ref 30.0–36.0)
MCV: 90.1 fL (ref 80.0–100.0)
Platelets: 278 10*3/uL (ref 150–400)
RBC: 4.55 MIL/uL (ref 4.22–5.81)
RDW: 13.7 % (ref 11.5–15.5)
WBC: 9.2 10*3/uL (ref 4.0–10.5)
nRBC: 0 % (ref 0.0–0.2)

## 2020-01-02 LAB — SALICYLATE LEVEL: Salicylate Lvl: <7 mg/dL — ABNORMAL LOW (ref 7.0–30.0)

## 2020-01-02 LAB — ACETAMINOPHEN LEVEL: Acetaminophen (Tylenol), Serum: <10 ug/mL — ABNORMAL LOW (ref 10–30)

## 2020-01-02 LAB — ETHANOL: Alcohol, Ethyl (B): <10 mg/dL (ref <10)

## 2020-01-02 MED ORDER — ALUM & MAG HYDROXIDE-SIMETH 200-200-20 MG/5ML PO SUSP: 30.0000 mL | Freq: Four times a day (QID) | ORAL | Status: DC | PRN

## 2020-01-02 MED ORDER — ONDANSETRON HCL 4 MG PO TABS: 4.0000 mg | ORAL_TABLET | Freq: Three times a day (TID) | ORAL | Status: DC | PRN

## 2020-01-02 MED ORDER — IBUPROFEN 400 MG PO TABS
600.0000 mg | ORAL_TABLET | Freq: Three times a day (TID) | ORAL | Status: DC | PRN
  Administered 2020-01-02: 600 mg via ORAL
  Filled 2020-01-02: qty 1

## 2020-01-02 MED ORDER — ACETAMINOPHEN 325 MG PO TABS: 650.0000 mg | ORAL_TABLET | Freq: Once | ORAL | Status: DC

## 2020-01-02 MED ORDER — NICOTINE 14 MG/24HR TD PT24: 14.0000 mg | MEDICATED_PATCH | Freq: Every day | TRANSDERMAL | Status: DC

## 2020-01-02 MED ORDER — NICOTINE 21 MG/24HR TD PT24
21.0000 mg | MEDICATED_PATCH | Freq: Every day | TRANSDERMAL | Status: DC
  Administered 2020-01-02 – 2020-01-03 (×2): 21 mg via TRANSDERMAL
  Filled 2020-01-02 (×2): qty 1

## 2020-01-02 MED ORDER — ACETAMINOPHEN 500 MG PO TABS
500.0000 mg | ORAL_TABLET | Freq: Once | ORAL | Status: AC
  Administered 2020-01-02: 500 mg via ORAL
  Filled 2020-01-02: qty 1

## 2020-01-02 MED ORDER — DULOXETINE HCL 30 MG PO CPEP
30.0000 mg | ORAL_CAPSULE | Freq: Every day | ORAL | Status: DC
  Administered 2020-01-02 – 2020-01-03 (×2): 30 mg via ORAL
  Filled 2020-01-02 (×4): qty 1

## 2020-01-02 NOTE — ED Provider Notes (Signed)
Accepted handoff at shift change from [employer]. Please see prior provider note for more detail.   Briefly: Patient is 34 y.o. patient is a 34 year old male with suicidal ideation.  Awaiting TTS consultation.  Plan: Follow-up on TTS evaluation and disposition appropriately.  Patient is voluntary.    Physical Exam  BP 97/64 (BP Location: Left Arm)   Pulse 60   Temp 98 F (36.7 C) (Oral)   Resp 16   Ht 6\' 1"  (1.854 m)   Wt 77.1 kg   SpO2 100%   BMI 22.43 kg/m   CONSTITUTIONAL:  well-appearing, NAD NEURO:  Alert and oriented x 3, no focal deficits EYES:  pupils equal and reactive ENT/NECK:  trachea midline, no JVD CARDIO:  reg rate, reg rhythm, well-perfused PULM:  None labored breathing GI/GU:  Abdomen non-distended MSK/SPINE:  No gross deformities, no edema SKIN:  no rash obvious, atraumatic, no ecchymosis  PSYCH:  Appropriate speech and behavior, sitting calmly in bed.  ED Course/Procedures     Procedures  Results for orders placed or performed during the hospital encounter of 01/01/20  Respiratory Panel by RT PCR (Flu A&B, Covid) - Nasopharyngeal Swab   Specimen: Nasopharyngeal Swab  Result Value Ref Range   SARS Coronavirus 2 by RT PCR NEGATIVE NEGATIVE   Influenza A by PCR NEGATIVE NEGATIVE   Influenza B by PCR NEGATIVE NEGATIVE  Comprehensive metabolic panel  Result Value Ref Range   Sodium 147 (H) 135 - 145 mmol/L   Potassium 3.9 3.5 - 5.1 mmol/L   Chloride 110 98 - 111 mmol/L   CO2 27 22 - 32 mmol/L   Glucose, Bld 112 (H) 70 - 99 mg/dL   BUN 13 6 - 20 mg/dL   Creatinine, Ser 1.00 0.61 - 1.24 mg/dL   Calcium 9.1 8.9 - 10.3 mg/dL   Total Protein 6.8 6.5 - 8.1 g/dL   Albumin 3.4 (L) 3.5 - 5.0 g/dL   AST 84 (H) 15 - 41 U/L   ALT 107 (H) 0 - 44 U/L   Alkaline Phosphatase 87 38 - 126 U/L   Total Bilirubin 0.5 0.3 - 1.2 mg/dL   GFR calc non Af Amer >60 >60 mL/min   GFR calc Af Amer >60 >60 mL/min   Anion gap 10 5 - 15  Ethanol  Result Value Ref Range   Alcohol, Ethyl (B) <01 <75 mg/dL  Salicylate level  Result Value Ref Range   Salicylate Lvl <1.0 (L) 7.0 - 30.0 mg/dL  Acetaminophen level  Result Value Ref Range   Acetaminophen (Tylenol), Serum <10 (L) 10 - 30 ug/mL  cbc  Result Value Ref Range   WBC 9.2 4.0 - 10.5 K/uL   RBC 4.55 4.22 - 5.81 MIL/uL   Hemoglobin 12.4 (L) 13.0 - 17.0 g/dL   HCT 41.0 39.0 - 52.0 %   MCV 90.1 80.0 - 100.0 fL   MCH 27.3 26.0 - 34.0 pg   MCHC 30.2 30.0 - 36.0 g/dL   RDW 13.7 11.5 - 15.5 %   Platelets 278 150 - 400 K/uL   nRBC 0.0 0.0 - 0.2 %  Rapid urine drug screen (hospital performed)  Result Value Ref Range   Opiates POSITIVE (A) NONE DETECTED   Cocaine POSITIVE (A) NONE DETECTED   Benzodiazepines NONE DETECTED NONE DETECTED   Amphetamines NONE DETECTED NONE DETECTED   Tetrahydrocannabinol POSITIVE (A) NONE DETECTED   Barbiturates NONE DETECTED NONE DETECTED   DG Chest 2 View  Result Date: 12/08/2019 CLINICAL DATA:  Arm redness/swelling EXAM: CHEST - 2 VIEW COMPARISON:  12/10/2014 FINDINGS: Lungs are clear.  No pleural effusion or pneumothorax. The heart is normal in size. Visualized osseous structures are within normal limits. IMPRESSION: Normal chest radiographs. Electronically Signed   By: Charline Bills M.D.   On: 12/08/2019 20:16   DG Forearm Right  Result Date: 12/09/2019 CLINICAL DATA:  Cellulitis and swelling EXAM: RIGHT FOREARM - 2 VIEW COMPARISON:  None. FINDINGS: No fracture or malalignment. No radiopaque foreign body. Diffuse soft tissue swelling. IMPRESSION: No acute osseous abnormality Electronically Signed   By: Jasmine Pang M.D.   On: 12/09/2019 00:48     MDM    Disposition: Per Garlan Fillers, NP, patient to remain in the ED overnight for Observation. Pending am psych evaluation.   Home meds ordered.  As needed's ordered.  Patient will be transferred over to [facility].    Solon Augusta [place], [place] 01/02/20 1331    Terald Sleeper, MD 01/02/20 [phone]

## 2020-01-02 NOTE — ED Notes (Signed)
Pt talking on hallway phone.  

## 2020-01-02 NOTE — ED Notes (Signed)
Pt taken to [facility] with sitter to be evaluated in a private room by TTS

## 2020-01-02 NOTE — ED Triage Notes (Signed)
[place].  Pt states he used heroin "a few hours ago" and when asked about factors that caused these feeling he stated "I'm stressed out."  No other statements to staff but is cooperative w/ dressing out and being wonded by security.

## 2020-01-02 NOTE — ED Provider Notes (Signed)
[facility] EMERGENCY DEPARTMENT Provider Note   CSN: [id number] Arrival date & time: 01/01/20  2353     History Chief Complaint  Patient presents with  . Suicidal    Mattson Dayal is a 34 y.o. male.  The history is provided by the patient and medical records.   34 y.o. F with hx of IVDU, elevated LFT's, presenting to the ED with SI.  Patient reports he had plan to jump off the [employer] today at [facility] during rush hour traffic.  When asked why he felt this way he states "life".  He does report some ongoing issues with substance abuse (notably heroin) along with family and monetary issues.  He denies any attempts at self harm.  He last used heroin earlier today.  Denies EtOH.  Denies HI/AVH.  He is interested in getting clean from drugs once and for all.  Past Medical History:  Diagnosis Date  . Back pain   . Drug abuse ([facility])   . IV drug abuse ([facility])   . IVDU (intravenous drug user)   . LFTs abnormal     Patient Active Problem List   Diagnosis Date Noted  . MDD (major depressive disorder), recurrent severe, without psychosis ([facility]) 10/30/2019  . Suicidal ideation 10/30/2019  . Cellulitis 10/13/2019  . Overdose of heroin, accidental or unintentional, initial encounter ([facility]) 05/31/2019  . Fungemia 05/02/2019  . Blood culture positive for Candida species 05/02/2019  . MRSA bacteremia 04/29/2019  . IVDU (intravenous drug user) 04/29/2019  . Abscess of right forearm 04/28/2019  . Hyperglycemia 04/28/2019  . Leucocytosis 04/28/2019  . LFTs abnormal 04/28/2019  . Opioid use disorder, severe, dependence ([facility]) 03/05/2019  . Opioid dependence with opioid-induced mood disorder ([facility])   . MDD (major depressive disorder), severe ([facility]) 02/07/2019    Past Surgical History:  Procedure Laterality Date  . I & D EXTREMITY Right 04/28/2019   Procedure: IRRIGATION AND DEBRIDEMENT ARM ABSCESS;  Surgeon: Sheral Apley, MD;  Location: [facility] OR;  Service:  Orthopedics;  Laterality: Right;  . TEE WITHOUT CARDIOVERSION N/A 05/03/2019   Procedure: TRANSESOPHAGEAL ECHOCARDIOGRAM (TEE);  Surgeon: Jake Bathe, MD;  Location: [facility] ENDOSCOPY;  Service: Cardiovascular;  Laterality: N/A;       No family history on file.  Social History   Tobacco Use  . Smoking status: Current Every Day Smoker  . Smokeless tobacco: Current User  Substance Use Topics  . Alcohol use: Yes  . Drug use: Yes    Types: IV, Cocaine, Amphetamines, Marijuana, Heroin, Methamphetamines    Comment: HEROIN    Home Medications Prior to Admission medications   Medication Sig Start Date End Date Taking? Authorizing Provider  DULoxetine (CYMBALTA) 30 MG capsule Take 1 capsule (30 mg total) by mouth daily. 11/04/19   Aldean Baker, NP  nicotine (NICODERM CQ - DOSED IN MG/24 HOURS) 14 mg/24hr patch Place 1 patch (14 mg total) onto the skin daily. 11/04/19   Aldean Baker, NP  cloNIDine (CATAPRES) 0.1 MG tablet Take 1 tablet (0.1 mg total) by mouth 3 (three) times daily for 5 days. Patient not taking: Reported on 03/24/2019 03/08/19 03/24/19  Virgina Norfolk, DO  traZODone (DESYREL) 50 MG tablet Take 1 tablet (50 mg total) by mouth at bedtime as needed for sleep. Patient not taking: Reported on 02/21/2019 02/11/19 03/24/19  Aldean Baker, NP    Allergies    Vicodin [hydrocodone-acetaminophen], Codeine, Codeine, Tramadol, Tramadol, and Vicodin [hydrocodone-acetaminophen]  Review of Systems  Review of Systems  Psychiatric/Behavioral: Positive for suicidal ideas.  All other systems reviewed and are negative.   Physical Exam Updated Vital Signs BP 112/76 (BP Location: Left Arm)   Pulse 88   Temp 98 F (36.7 C) (Oral)   Resp 16   Ht 6\' 1"  (1.854 m)   Wt 77.1 kg   SpO2 100%   BMI 22.43 kg/m   Physical Exam Vitals and nursing note reviewed.  Constitutional:      Appearance: He is well-developed.     Comments: Sleeping, NAD  HENT:     Head: Normocephalic and atraumatic.   Eyes:     Conjunctiva/sclera: Conjunctivae normal.     Pupils: Pupils are equal, round, and reactive to light.  Cardiovascular:     Rate and Rhythm: Normal rate and regular rhythm.     Heart sounds: Normal heart sounds.  Pulmonary:     Effort: Pulmonary effort is normal.     Breath sounds: Normal breath sounds.  Abdominal:     General: Bowel sounds are normal.     Palpations: Abdomen is soft.  Musculoskeletal:        General: Normal range of motion.     Cervical back: Normal range of motion.  Skin:    General: Skin is warm and dry.  Neurological:     Mental Status: He is alert and oriented to person, place, and time.  Psychiatric:        Attention and Perception: He does not perceive auditory hallucinations.        Thought Content: Thought content includes suicidal ideation. Thought content does not include homicidal ideation. Thought content includes suicidal plan. Thought content does not include homicidal plan.     ED Results / Procedures / Treatments   Labs (all labs ordered are listed, but only abnormal results are displayed) Labs Reviewed  COMPREHENSIVE METABOLIC PANEL - Abnormal; Notable for the following components:      Result Value   Sodium 147 (*)    Glucose, Bld 112 (*)    Albumin 3.4 (*)    AST 84 (*)    ALT 107 (*)    All other components within normal limits  SALICYLATE LEVEL - Abnormal; Notable for the following components:   Salicylate Lvl <7.0 (*)    All other components within normal limits  ACETAMINOPHEN LEVEL - Abnormal; Notable for the following components:   Acetaminophen (Tylenol), Serum <10 (*)    All other components within normal limits  CBC - Abnormal; Notable for the following components:   Hemoglobin 12.4 (*)    All other components within normal limits  RAPID URINE DRUG SCREEN, HOSP PERFORMED - Abnormal; Notable for the following components:   Opiates POSITIVE (*)    Cocaine POSITIVE (*)    Tetrahydrocannabinol POSITIVE (*)    All  other components within normal limits  RESPIRATORY PANEL BY RT PCR (FLU A&B, COVID)  ETHANOL    EKG None  Radiology No results found.  Procedures Procedures (including critical care time)  Medications Ordered in ED Medications  ondansetron (ZOFRAN) tablet 4 mg (has no administration in time range)  alum & mag hydroxide-simeth (MAALOX/MYLANTA) 200-200-20 MG/5ML suspension 30 mL (has no administration in time range)  nicotine (NICODERM CQ - dosed in mg/24 hours) patch 21 mg (has no administration in time range)  ibuprofen (ADVIL) tablet 600 mg (600 mg Oral Given 01/02/20 03/03/20)    ED Course  I have reviewed the triage vital signs and the nursing  notes.  Pertinent labs & imaging results that were available during my care of the patient were reviewed by me and considered in my medical decision making (see chart for details).    MDM Rules/Calculators/A&P  34 year old male presenting to the ED with suicidal ideation.  States he had plans to jump off the [employer] bridge [place] 5 [place] traffic.  He denies any homicidal ideation, no hallucinations.  Does report some ongoing substance abuse issues, notably heroin.  He last use earlier today.  He is afebrile nontoxic in appearance.  His screening labs are grossly reassuring.  UDS is positive for opiates, cocaine, and THC.  He is medically cleared.  Covid screen negative.  Will get TTS evaluation.  Final Clinical Impression(s) / ED Diagnoses Final diagnoses:  Suicidal ideation    Rx / DC Orders ED Discharge Orders    None       Larene Pickett, PA-C 01/02/20 0601    Fatima Blank, MD 01/03/20 [phone]

## 2020-01-02 NOTE — ED Notes (Signed)
Lunch Tray Ordered @ 1042. 

## 2020-01-02 NOTE — ED Notes (Signed)
Pt provided dinner tray. Sitter present. Will continue to monitor.

## 2020-01-02 NOTE — BH Assessment (Addendum)
Assessment Note  Jon Castro is an 34 y.o. male. He presents to [facility], voluntarily. Patient is a self referral. He has a complaint of suicidal ideations with a plan to jump off a bridge. States that he would jump off the bridge at 5pm around the time of rush hour traffic. He has not history of prior suicide attempts and/or gestures. He denies a history of self mutilating behaviors. When asked about stressors/triggers he states, "Everything". Patient refused to elaborate any further. He states that he is living place to place. He has on support system and is unemployed. Patient denies a family history of mental health treatment.   He denies HI. He has no history of harm to others. He is currently calm and cooperative. Denies legal issues, court dates, and states that he is not on probation. Patient denies AVH's. He does not appear to be responding to internal stimuli.   Patient does report a history of heroin-IV use. He started using at the age of 71. He uses 1 gram daily. His last use was 01/01/2020.Patient denies any other drug use and alcohol use.   He has received inpatient treatment in the past-[facility]-2020 for suicidal ideations. He does not have a therapist and/or psychiatrist.   Patient is oriented to time, person, place, and situation. Speech is normal. Affect is flat. Mood is depressed and sad. Insight and judgement is poor. Impulse control is fair at this time. Patient is dressed in scrubs.   Diagnosis: Depressive Disorder and Substance Use Disorder  Past Medical History:  Past Medical History:  Diagnosis Date  . Back pain   . Drug abuse (HCC)   . IV drug abuse (HCC)   . IVDU (intravenous drug user)   . LFTs abnormal     Past Surgical History:  Procedure Laterality Date  . I & D EXTREMITY Right 04/28/2019   Procedure: IRRIGATION AND DEBRIDEMENT ARM ABSCESS;  Surgeon: Sheral Apley, MD;  Location: [facility] OR;  Service: Orthopedics;  Laterality: Right;  . TEE WITHOUT CARDIOVERSION  N/A 05/03/2019   Procedure: TRANSESOPHAGEAL ECHOCARDIOGRAM (TEE);  Surgeon: Jake Bathe, MD;  Location: [facility] ENDOSCOPY;  Service: Cardiovascular;  Laterality: N/A;    Family History: No family history on file.  Social History:  reports that he has been smoking. He uses smokeless tobacco. He reports current alcohol use. He reports current drug use. Drugs: IV, Cocaine, Amphetamines, Marijuana, Heroin, and Methamphetamines.  Additional Social History:  Alcohol / Drug Use Pain Medications: see MAR Prescriptions: see MAR Over the Counter: see MAR History of alcohol / drug use?: Yes Longest period of sobriety (when/how long): "I'm not really sure" Negative Consequences of Use: Personal relationships, Work / Programmer, multimedia, Surveyor, quantity Withdrawal Symptoms: Patient aware of relationship between substance abuse and physical/medical complications Substance #1 Name of Substance 1: Heroin- IV use 1 - Age of First Use: 34 yrs old 1 - Amount (size/oz): 1/2 gram per day 1 - Frequency: daily 1 - Duration: "Several yrs, 3-4 yrs" 1 - Last Use / Amount: 01/01/2020 @ 9am  CIWA: CIWA-Ar BP: 120/68 Pulse Rate: 79 COWS:    Allergies:  Allergies  Allergen Reactions  . Vicodin [Hydrocodone-Acetaminophen] Itching  . Codeine Nausea And Vomiting  . Codeine   . Tramadol Other (See Comments)    "messes with head"  . Tramadol   . Vicodin [Hydrocodone-Acetaminophen]     Home Medications: (Not in a hospital admission)   OB/GYN Status:  No LMP for male patient.  General Assessment Data Is  this a Tele or Face-to-Face Assessment?: Tele Assessment Is this an Initial Assessment or a Re-assessment for this encounter?: Initial Assessment Patient Accompanied by:: (self referral ) Language Other than English: No Living Arrangements: ("I'm living place to place") What gender do you identify as?: Male Marital status: Single Maiden name: (n/a) Pregnancy Status: (n/a) Living Arrangements: ("Im in between places") Can  pt return to current living arrangement?: Yes Admission Status: Voluntary Is patient capable of signing voluntary admission?: Yes Referral Source: Self/Family/Friend Insurance type: (Self Pay )  Medical Screening Exam [facility] Walk-in ONLY) Medical Exam completed: No  Crisis Care Plan Living Arrangements: ("Im in between places") Legal Guardian: (no legal guardian ) Name of Psychiatrist: (none reported) Name of Therapist: (none reported)  Education Status Is patient currently in school?: No Is the patient employed, unemployed or receiving disability?: Unemployed  Risk to self with the past 6 months Suicidal Ideation: Yes-Currently Present(x1 week ) Has patient been a risk to self within the past 6 months prior to admission? : Yes Suicidal Intent: Yes-Currently Present Has patient had any suicidal intent within the past 6 months prior to admission? : Yes Is patient at risk for suicide?: Yes Suicidal Plan?: Yes-Currently Present Specify Current Suicidal Plan: (Jump off [employer] in 5pm traffic ) Access to [employer]: Yes Specify Access to Suicidal Means: (access to bridge and traffic ) What has been your use of drugs/alcohol within the last 12 months?: (heroin-IV ) Previous Attempts/Gestures: No How many times?: (0) Other Self Harm Risks: (no self harm ) Triggers for Past Attempts: Other (Comment)(no past attempts ) Intentional Self Injurious Behavior: None Family Suicide History: No Recent stressful life event(s): Other (Comment)("Everything"; patient would not provide any specific info.) Persecutory voices/beliefs?: No Depression: Yes Depression Symptoms: Feeling angry/irritable, Feeling worthless/self pity, Guilt, Loss of interest in usual pleasures Substance abuse history and/or treatment for substance abuse?: No Suicide prevention information given to non-admitted patients: Not applicable  Risk to Others within the past 6 months Homicidal Ideation: No Does patient have  any lifetime risk of violence toward others beyond the six months prior to admission? : No Thoughts of Harm to Others: No Current Homicidal Intent: No Current Homicidal Plan: No Access to Homicidal Means: No Identified Victim: (n/a) History of harm to others?: No Assessment of Violence: None Noted Violent Behavior Description: (currenty calm and cooperative ) Does patient have access to weapons?: No Criminal Charges Pending?: No Does patient have a court date: No Is patient on probation?: No  Psychosis Hallucinations: None noted Delusions: None noted  Mental Status Report Appearance/Hygiene: In scrubs Eye Contact: Poor Motor Activity: Freedom of movement Speech: Logical/coherent Level of Consciousness: Alert, Irritable Mood: Depressed Affect: Depressed Anxiety Level: Panic Attacks Panic attack frequency: (3-4 times per week ) Most recent panic attack: (1 day ago; 01/01/2020) Thought Processes: Relevant Judgement: Impaired Orientation: Person, Place, Situation, Time Obsessive Compulsive Thoughts/Behaviors: None  Cognitive Functioning Concentration: Normal Memory: Recent Intact, Remote Intact Is patient IDD: No Insight: Poor Impulse Control: Poor Appetite: Fair Have you had any weight changes? : No Change Sleep: Decreased(3 hrs per night ) Total Hours of Sleep: (3 hrs per night ) Vegetative Symptoms: None  ADLScreening [facility] Assessment Services) Patient's cognitive ability adequate to safely complete daily activities?: Yes Patient able to express need for assistance with ADLs?: Yes Independently performs ADLs?: Yes (appropriate for developmental age)  Prior Inpatient Therapy Prior Inpatient Therapy: Yes Prior Therapy Dates: (2020) Prior Therapy Facilty/Provider(s): ("[facility]") Reason for Treatment: (suicidal thoughts )  Prior  Outpatient Therapy Prior Outpatient Therapy: No Does patient have an ACCT team?: No Does patient have Intensive In-House Services?  : No Does  patient have [facility] services? : No Does patient have P4CC services?: No  ADL Screening (condition at time of admission) Patient's cognitive ability adequate to safely complete daily activities?: Yes Is the patient deaf or have difficulty hearing?: No Does the patient have difficulty seeing, even when wearing glasses/contacts?: No Does the patient have difficulty concentrating, remembering, or making decisions?: No Patient able to express need for assistance with ADLs?: Yes Does the patient have difficulty dressing or bathing?: No Independently performs ADLs?: Yes (appropriate for developmental age) Does the patient have difficulty walking or climbing stairs?: No Weakness of Legs: None Weakness of Arms/Hands: None  Home Assistive Devices/Equipment Home Assistive Devices/Equipment: None    Abuse/Neglect Assessment (Assessment to be complete while patient is alone) Physical Abuse: Denies Verbal Abuse: Denies Sexual Abuse: Denies Self-Neglect: Denies     Advance Directives (For Healthcare) Does Patient Have a Medical Advance Directive?: No          Disposition: Per Tinnie Gens, NP, patient to remain in the ED overnight for Observation. Pending am psych evaluation.     On Site Evaluation by:   Reviewed with Physician:    Waldon Merl 01/02/2020 8:51 AM

## 2020-01-02 NOTE — ED Notes (Signed)
Pt has been screened and cleared by security, pt is sitting in peds triage

## 2020-01-02 NOTE — ED Notes (Signed)
Belongings inventoried and placed in locker 2 by Jeannett Senior, EMT.

## 2020-01-03 NOTE — ED Provider Notes (Signed)
Behavioral health has indicated the patient is appropriate for discharge, can follow-up as an outpatient.  Resources will be provided.   Gerhard Munch, MD 01/03/20 1143

## 2020-01-03 NOTE — Progress Notes (Signed)
Patient ID: Jon Castro, male   DOB: September 19, 1985, 34 y.o.   MRN: [id number]     Psychiatric reassessment   HPI: Jon Castro is an 34 y.o. male. He presents to [facility], voluntarily. Patient is a self referral. He has a complaint of suicidal ideations with a plan to jump off a bridge. States that he would jump off the bridge at 5pm around the time of rush hour traffic. He has not history of prior suicide attempts and/or gestures. He denies a history of self mutilating behaviors. When asked about stressors/triggers he states, "Everything". Patient refused to elaborate any further. He states that he is living place to place. He has on support system and is unemployed. Patient denies a family history of mental health treatment.   He denies HI. He has no history of harm to others. He is currently calm and cooperative. Denies legal issues, court dates, and states that he is not on probation. Patient denies AVH's. He does not appear to be responding to internal stimuli.   Patient does report a history of heroin-IV use. He started using at the age of 51. He uses 1 gram daily. His last use was 01/01/2020.Patient denies any other drug use and alcohol use.   He has received inpatient treatment in the past-[facility]-2020 for suicidal ideations. He does not have a therapist and/or psychiatrist.     Psychiatric evaluation: Jon Castro is a 34 year old male who presented to [facility] for concerns as noted above. He is well know to the behavioral health system and psychiatric history is significant for polysubstance abuse and voicing depression and suicidal thoughts mostly secondary to his drug abuse.He initially  presented to the ED, voluntarily, stating he was suicidal with a plan to jump off a bridge.He however, denies any SI, HI or psychosis today. He denied history of suicide attempts or NSSIB. He admitted to a long history of polysubstance abuse and we discussed the need for substance abuse treatment/services  as he has had accidental unintentional drug overdoses due to his substance abuse and he ws receptive and open. He endorsed no other psychiatric concerns at this time.   Disposition: Patient denies SI, HI or psychosis. He has a significant history of polysubstance abuse and voicing  suicidal thoughts mostly secondary to his drug abuse . He stated that he was interested in substance abuse outpatient resources which will be faxed by CSW. At this time, there is no evidence of imminent risk to self or others at present. Patient does not meet criteria for psychiatric inpatient admission and is psychiatrically cleared.   ED aware of disposition

## 2020-01-03 NOTE — ED Notes (Signed)
Breakfast ordered 

## 2020-01-19 DIAGNOSIS — F1194 Opioid use, unspecified with opioid-induced mood disorder: Secondary | ICD-10-CM | POA: Insufficient documentation

## 2020-01-19 DIAGNOSIS — Z20822 Contact with and (suspected) exposure to covid-19: Secondary | ICD-10-CM | POA: Insufficient documentation

## 2020-01-19 DIAGNOSIS — Z79899 Other long term (current) drug therapy: Secondary | ICD-10-CM | POA: Insufficient documentation

## 2020-01-19 DIAGNOSIS — F1120 Opioid dependence, uncomplicated: Secondary | ICD-10-CM | POA: Insufficient documentation

## 2020-01-19 DIAGNOSIS — F17210 Nicotine dependence, cigarettes, uncomplicated: Secondary | ICD-10-CM | POA: Insufficient documentation

## 2020-01-19 DIAGNOSIS — R45851 Suicidal ideations: Secondary | ICD-10-CM | POA: Insufficient documentation

## 2020-01-20 ENCOUNTER — Other Ambulatory Visit: Payer: Self-pay

## 2020-01-20 ENCOUNTER — Observation Stay (HOSPITAL_COMMUNITY): Admission: AD | Admit: 2020-01-20 | Payer: Self-pay | Source: Intra-hospital | Admitting: Psychiatry

## 2020-01-20 ENCOUNTER — Emergency Department (HOSPITAL_COMMUNITY)
Admission: EM | Admit: 2020-01-20 | Discharge: 2020-01-20 | Disposition: A | Discharge status: Home / Self Care | Payer: Self-pay | Attending: Emergency Medicine | Admitting: Emergency Medicine

## 2020-01-20 DIAGNOSIS — F1190 Opioid use, unspecified, uncomplicated: Secondary | ICD-10-CM

## 2020-01-20 DIAGNOSIS — E876 Hypokalemia: Secondary | ICD-10-CM

## 2020-01-20 LAB — COMPREHENSIVE METABOLIC PANEL
ALT: 82 U/L — ABNORMAL HIGH (ref 0–44)
AST: 54 U/L — ABNORMAL HIGH (ref 15–41)
Albumin: 3.3 g/dL — ABNORMAL LOW (ref 3.5–5.0)
Alkaline Phosphatase: 71 U/L (ref 38–126)
Anion gap: 8 (ref 5–15)
BUN: 8 mg/dL (ref 6–20)
CO2: 25 mmol/L (ref 22–32)
Calcium: 8.7 mg/dL — ABNORMAL LOW (ref 8.9–10.3)
Chloride: 104 mmol/L (ref 98–111)
Creatinine, Ser: 0.8 mg/dL (ref 0.61–1.24)
GFR calc Af Amer: >60 mL/min (ref >60)
GFR calc non Af Amer: >60 mL/min (ref >60)
Glucose, Bld: 123 mg/dL — ABNORMAL HIGH (ref 70–99)
Potassium: 3.3 mmol/L — ABNORMAL LOW (ref 3.5–5.1)
Sodium: 137 mmol/L (ref 135–145)
Total Bilirubin: 0.4 mg/dL (ref 0.3–1.2)
Total Protein: 6.7 g/dL (ref 6.5–8.1)

## 2020-01-20 LAB — CBC
HCT: 38.1 % — ABNORMAL LOW (ref 39.0–52.0)
Hemoglobin: 12.3 g/dL — ABNORMAL LOW (ref 13.0–17.0)
MCH: 26.7 pg (ref 26.0–34.0)
MCHC: 32.3 g/dL (ref 30.0–36.0)
MCV: 82.6 fL (ref 80.0–100.0)
Platelets: 226 10*3/uL (ref 150–400)
RBC: 4.61 MIL/uL (ref 4.22–5.81)
RDW: 13.4 % (ref 11.5–15.5)
WBC: 10.5 10*3/uL (ref 4.0–10.5)
nRBC: 0 % (ref 0.0–0.2)

## 2020-01-20 LAB — RAPID URINE DRUG SCREEN, HOSP PERFORMED
Amphetamines: NOT DETECTED
Barbiturates: NOT DETECTED
Benzodiazepines: NOT DETECTED
Cocaine: NOT DETECTED
Opiates: NOT DETECTED
Tetrahydrocannabinol: POSITIVE — AB

## 2020-01-20 LAB — SARS CORONAVIRUS 2 BY RT PCR (HOSPITAL ORDER, PERFORMED IN ~~LOC~~ HOSPITAL LAB): SARS Coronavirus 2: NEGATIVE

## 2020-01-20 LAB — SALICYLATE LEVEL: Salicylate Lvl: <7 mg/dL — ABNORMAL LOW (ref 7.0–30.0)

## 2020-01-20 LAB — ETHANOL: Alcohol, Ethyl (B): <10 mg/dL (ref <10)

## 2020-01-20 LAB — ACETAMINOPHEN LEVEL: Acetaminophen (Tylenol), Serum: <10 ug/mL — ABNORMAL LOW (ref 10–30)

## 2020-01-20 MED ORDER — NICOTINE 21 MG/24HR TD PT24
21.0000 mg | MEDICATED_PATCH | Freq: Every day | TRANSDERMAL | Status: DC
  Administered 2020-01-20: 21 mg via TRANSDERMAL
  Filled 2020-01-20: qty 1

## 2020-01-20 MED ORDER — POTASSIUM CHLORIDE CRYS ER 20 MEQ PO TBCR
40.0000 meq | EXTENDED_RELEASE_TABLET | Freq: Once | ORAL | Status: AC
  Administered 2020-01-20: 40 meq via ORAL
  Filled 2020-01-20: qty 2

## 2020-01-20 MED ORDER — DULOXETINE HCL 30 MG PO CPEP
30.0000 mg | ORAL_CAPSULE | Freq: Every day | ORAL | Status: DC
  Filled 2020-01-20: qty 1

## 2020-01-20 NOTE — ED Notes (Signed)
Pt is back in triage.

## 2020-01-20 NOTE — ED Triage Notes (Signed)
Pt reporting that he wants to hurt himself x 1week. Pt said his plan was to hang himself.

## 2020-01-20 NOTE — ED Notes (Signed)
Patient walked to purple with sitter to TTS

## 2020-01-20 NOTE — Discharge Instructions (Addendum)
Recommend going to the interactive resource center [facility]) as soon as possible for any further management of your complaints. Your potassium was mildly lower than normal.  Please see the attached information to try to replace this in your diet. Ideally, you would have this retested in the next week or two. Return to the emergency department or call 911 should you begin to have thoughts of suicide, self-harm, or hurting others.

## 2020-01-20 NOTE — BH Assessment (Signed)
Tele Assessment Note   Patient Name: Jon Castro MRN: [id number] Referring Physician: Harolyn Rutherford, PA Location of Patient: [facility] Location of Provider: Behavioral Health TTS Department  Kendyl Bissonnette is an 34 y.o. male with a history of Opioid Use Disorder and depression who presents voluntarily endorsing SI with a plan to hang himself.  Patient has had recent similar presentations and no follow up with outpatient treatment recommendations.   He states he is unable to get to appointments, as he is homeless with no transportation.  Patient was admitted to [facility] in 01/2019 and to [facility] in 03/2019 for SI/detox. He states he has continued to use 1/2 gram of IV heroin daily.  He states continued use is the current stressor/trigger for current SI.  He denies history of attempts/gestures.  Patient also denies HI and AVH.  Upon discussion of residential SA programs, patient seemed interested however continued to endorse SI.  He is open to inpatient treatment for stabilization at this time.    Patient states he has no support and no one to provide collateral.  Patient is calm, cooperative and behaviorally appropriate.  Patient is dressed in hospital scrubs.  Speech is soft.  Eye contact is fair.  Patient's mood is depressed.  Affect is congruent with mood.  Thought process is coherent and relevant.  There is no indication patient is responding to internal stimuli or experiencing delusional thought content.  Judgement and insight are limited.     Per Denzil Magnuson, NP 24 hour observation is recommended. Also, peer support consult is recommended to discuss SA treatment options.   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 Back pain   . Drug abuse (HCC)   . IV drug abuse (HCC)   . IVDU (intravenous drug user)   . LFTs abnormal      Past Surgical History:  Procedure Laterality Date  . I & D EXTREMITY Right 04/28/2019   Procedure: IRRIGATION AND DEBRIDEMENT ARM ABSCESS;  Surgeon: Sheral Apley, MD;  Location: [facility] OR;  Service: Orthopedics;  Laterality: Right;  . TEE WITHOUT CARDIOVERSION N/A 05/03/2019   Procedure: TRANSESOPHAGEAL ECHOCARDIOGRAM (TEE);  Surgeon: Jake Bathe, MD;  Location: [facility] ENDOSCOPY;  Service: Cardiovascular;  Laterality: N/A;    Family History: No family history on file.  Social History:  reports that he has been smoking. He uses smokeless tobacco. He reports current alcohol use. He reports current drug use. Drugs: IV, Cocaine, Amphetamines, Marijuana, Heroin, and Methamphetamines.  Additional Social History:  Alcohol / Drug Use Pain Medications: see MAR Prescriptions: see MAR Over the Counter: see MAR History of alcohol / drug use?: Yes Longest period of sobriety (when/how long): Patient unsure Negative Consequences of Use: Personal relationships, Work / Programmer, multimedia, Surveyor, quantity Withdrawal Symptoms: Patient aware of relationship between substance abuse and physical/medical complications Substance #1 Name of Substance 1: IV Heroin use 1 - Age of First Use: 26 1 - Amount (size/oz): 1/2 gram per day 1 - Frequency: daily 1 - Duration: "Several yrs, 3-4 yrs" 1 - Last Use / Amount: 01/19/20  CIWA: CIWA-Ar BP: 110/65 Pulse Rate: 63 COWS:    Allergies:  Allergies  Allergen Reactions  . Vicodin [Hydrocodone-Acetaminophen] Itching  . Codeine Nausea And Vomiting  . Codeine   .  Tramadol Other (See Comments)    "messes with head"  . Tramadol   . Vicodin [Hydrocodone-Acetaminophen]     Home Medications: (Not in a hospital admission)   OB/GYN Status:  No LMP for male patient.  General Assessment Data Location of Assessment: [facility] ED TTS Assessment: In system Is this a Tele or Face-to-Face Assessment?: Face-to-Face Is this an Initial Assessment or a Re-assessment for this encounter?:  Initial Assessment Patient Accompanied by:: N/A Language Other than English: No Living Arrangements: Homeless/Shelter What gender do you identify as?: Male Marital status: Single Maiden name: N/A Pregnancy Status: No Living Arrangements: Alone Can pt return to current living arrangement?: (N/A) Admission Status: Voluntary Is patient capable of signing voluntary admission?: Yes Referral Source: Self/Family/Friend Insurance type: Self-pay     Crisis Care Plan Living Arrangements: Alone Legal Guardian: Other:(Self) Name of Psychiatrist: None Name of Therapist: None  Education Status Is patient currently in school?: No Is the patient employed, unemployed or receiving disability?: Unemployed  Risk to self with the past 6 months Suicidal Ideation: Yes-Currently Present Has patient been a risk to self within the past 6 months prior to admission? : Yes Suicidal Intent: Yes-Currently Present Has patient had any suicidal intent within the past 6 months prior to admission? : Yes Is patient at risk for suicide?: Yes Suicidal Plan?: Yes-Currently Present Has patient had any suicidal plan within the past 6 months prior to admission? : Yes Specify Current Suicidal Plan: Plan to hang himself Access to Means: Yes Specify Access to Suicidal Means: access to means to hang himself What has been your use of drugs/alcohol within the last 12 months?: daily heroin use Previous Attempts/Gestures: No How many times?: 0 Other Self Harm Risks: Hx of SI with plans, low support, homeless Triggers for Past Attempts: Other (Comment)(SA, homelessness) Intentional Self Injurious Behavior: None Family Suicide History: No Recent stressful life event(s): Financial Problems, Other (Comment)(SA, homelessness, low support) Persecutory voices/beliefs?: No Depression: Yes Depression Symptoms: Despondent, Insomnia, Fatigue, Loss of interest in usual pleasures, Feeling worthless/self pity, Feeling  angry/irritable Substance abuse history and/or treatment for substance abuse?: Yes Suicide prevention information given to non-admitted patients: Not applicable  Risk to Others within the past 6 months Homicidal Ideation: No Does patient have any lifetime risk of violence toward others beyond the six months prior to admission? : No Thoughts of Harm to Others: No Current Homicidal Intent: No Current Homicidal Plan: No Access to Homicidal Means: No Identified Victim: N/A History of harm to others?: No Assessment of Violence: None Noted Violent Behavior Description: N/A Does patient have access to weapons?: No Criminal Charges Pending?: No Does patient have a court date: No Is patient on probation?: No  Psychosis Hallucinations: None noted Delusions: None noted  Mental Status Report Appearance/Hygiene: In scrubs Eye Contact: Fair Motor Activity: Unremarkable Speech: Logical/coherent Level of Consciousness: Alert, Irritable Mood: Depressed, Irritable Affect: Depressed Anxiety Level: Minimal Panic attack frequency: N/A(per EHR, hx of panic attacks  - denies recent panic attacks) Most recent panic attack: unknown Thought Processes: Coherent, Relevant Judgement: Impaired Orientation: Person, Place, Time, Situation Obsessive Compulsive Thoughts/Behaviors: None  Cognitive Functioning Concentration: Decreased Memory: Remote Intact, Recent Intact Is patient IDD: No Insight: Fair Impulse Control: Poor Appetite: Fair Have you had any weight changes? : No Change Sleep: Decreased Total Hours of Sleep: 4  ADLScreening [facility] Assessment Services) Patient's cognitive ability adequate to safely complete daily activities?: Yes Patient able to express need for assistance with ADLs?: Yes Independently performs ADLs?: Yes (appropriate for  developmental age)  Prior Inpatient Therapy Prior Inpatient Therapy: Yes Prior Therapy Dates: 01/2019, 03/2019, 2014 Prior Therapy  Facilty/Provider(s): [facility], [facility], [facility] Reason for Treatment: SI, detox  Prior Outpatient Therapy Prior Outpatient Therapy: No Does patient have an ACCT team?: No Does patient have Intensive In-House Services?  : No Does patient have [facility] services? : No Does patient have P4CC services?: No  ADL Screening (condition at time of admission) Patient's cognitive ability adequate to safely complete daily activities?: Yes Is the patient deaf or have difficulty hearing?: No Does the patient have difficulty seeing, even when wearing glasses/contacts?: No Does the patient have difficulty concentrating, remembering, or making decisions?: No Patient able to express need for assistance with ADLs?: Yes Does the patient have difficulty dressing or bathing?: No Independently performs ADLs?: Yes (appropriate for developmental age) Does the patient have difficulty walking or climbing stairs?: No Weakness of Legs: None Weakness of Arms/Hands: None  Home Assistive Devices/Equipment Home Assistive Devices/Equipment: None  Therapy Consults (therapy consults require a physician order) PT Evaluation Needed: No OT Evalulation Needed: No SLP Evaluation Needed: No Abuse/Neglect Assessment (Assessment to be complete while patient is alone) Abuse/Neglect Assessment Can Be Completed: Yes Physical Abuse: Denies Verbal Abuse: Denies Sexual Abuse: Denies Exploitation of patient/patient's resources: Denies Self-Neglect: Denies Values / Beliefs Cultural Requests During Hospitalization: None Spiritual Requests During Hospitalization: None Consults Spiritual Care Consult Needed: No Transition of Care Team Consult Needed: No Advance Directives (For Healthcare) Does Patient Have a Medical Advance Directive?: No Would patient like information on creating a medical advance directive?: No - Patient declined     Disposition: Per Denzil Magnuson, NP 24 hour observation is recommended. Also, peer support  consult is recommended to discuss SA treatment options.  Disposition Initial Assessment Completed for this Encounter: Yes Patient referred to: Other (Comment)(TBD)  This service was provided via telemedicine using a 2-way, interactive audio and video technology.  Names of all persons participating in this telemedicine service and their role in this encounter. Name: Jon Castro Role: Patient  Name: Sydell Axon, [facility] Role: TTS Therapist  Name: Denzil Magnuson, NP Role: TTS Provider   Yetta Glassman 01/20/2020 8:16 AM

## 2020-01-20 NOTE — BH Assessment (Signed)
Patient was scheduled for transport to [facility] for 24 hr OBS.  He informed staff that he preferred to go to [place] for detox, as [facility] "did not help with detox."    LPC reassessed patient and he is now stating he has no intent to harm himself and "just has thoughts sometimes."  He states he has his son to live for, and he is planning to be more involved in his son's life. Patient does not want to wait for the Peer Support Specialist consult and plans to go to the [facility] [place] to speak with their case manager about SA treatment options/referrals.  He engaged in safety planning and affirmed his safety for discharge.   Patient is psychiatrically cleared per Denzil Magnuson, NP.

## 2020-01-20 NOTE — ED Notes (Signed)
Attempted to call [facility] unsuccessful. No answer

## 2020-01-20 NOTE — ED Provider Notes (Signed)
[facility] EMERGENCY DEPARTMENT Provider Note   CSN: [id number] Arrival date & time: 01/19/20  2356     History Chief Complaint  Patient presents with  . Medical Clearance    Jon Castro is a 34 y.o. male.  HPI      Jon Castro is a 34 y.o. male, with a history of IV drug use, presenting to the ED with suicidal ideas. States, "I've been thinking about hurting myself."   Plan:  "I was thinking about hanging myself."  Support system: Denies any support system.   Previous Attempts:  Denies previous attempts  Drug/alcohol use:  Uses IV heroin daily. Last use was yesterday. Denies use of other illicit drugs or alcohol.   Medication compliance: He has not had any medications since his most recent evaluation in the hospital for SI May 5-7, 2021. States he can not afford the medication.   Medication changes:  Cymbalta was added during patient's most recent assessment for SI  May 5-7, 2021.   Physical complaints:  None. Denies HI, AVH.       Past Medical History:  Diagnosis Date  . Back pain   . Drug abuse (HCC)   . IV drug abuse (HCC)   . IVDU (intravenous drug user)   . LFTs abnormal     Patient Active Problem List   Diagnosis Date Noted  . MDD (major depressive disorder), recurrent severe, without psychosis (HCC) 10/30/2019  . Suicidal ideation 10/30/2019  . Cellulitis 10/13/2019  . Overdose of heroin, accidental or unintentional, initial encounter (HCC) 05/31/2019  . Fungemia 05/02/2019  . Blood culture positive for Candida species 05/02/2019  . MRSA bacteremia 04/29/2019  . IVDU (intravenous drug user) 04/29/2019  . Abscess of right forearm 04/28/2019  . Hyperglycemia 04/28/2019  . Leucocytosis 04/28/2019  . LFTs abnormal 04/28/2019  . Opioid use disorder, severe, dependence (HCC) 03/05/2019  . Opioid dependence with opioid-induced mood disorder (HCC)   . MDD (major depressive disorder), severe (HCC) 02/07/2019     Past Surgical History:  Procedure Laterality Date  . I & D EXTREMITY Right 04/28/2019   Procedure: IRRIGATION AND DEBRIDEMENT ARM ABSCESS;  Surgeon: Sheral Apley, MD;  Location: [facility] OR;  Service: Orthopedics;  Laterality: Right;  . TEE WITHOUT CARDIOVERSION N/A 05/03/2019   Procedure: TRANSESOPHAGEAL ECHOCARDIOGRAM (TEE);  Surgeon: Jake Bathe, MD;  Location: [facility] ENDOSCOPY;  Service: Cardiovascular;  Laterality: N/A;       No family history on file.  Social History   Tobacco Use  . Smoking status: Current Every Day Smoker  . Smokeless tobacco: Current User  Substance Use Topics  . Alcohol use: Yes  . Drug use: Yes    Types: IV, Cocaine, Amphetamines, Marijuana, Heroin, Methamphetamines    Comment: HEROIN    Home Medications Prior to Admission medications   Medication Sig Start Date End Date Taking? Authorizing Provider  DULoxetine (CYMBALTA) 30 MG capsule Take 1 capsule (30 mg total) by mouth daily. Patient not taking: Reported on 01/02/2020 11/04/19   Aldean Baker, NP  nicotine (NICODERM CQ - DOSED IN MG/24 HOURS) 14 mg/24hr patch Place 1 patch (14 mg total) onto the skin daily. Patient not taking: Reported on 01/02/2020 11/04/19   Aldean Baker, NP  cloNIDine (CATAPRES) 0.1 MG tablet Take 1 tablet (0.1 mg total) by mouth 3 (three) times daily for 5 days. Patient not taking: Reported on 03/24/2019 03/08/19 03/24/19  Virgina Norfolk, DO  traZODone (DESYREL) 50 MG tablet Take  1 tablet (50 mg total) by mouth at bedtime as needed for sleep. Patient not taking: Reported on 02/21/2019 02/11/19 03/24/19  Aldean Baker, NP    Allergies    Vicodin [hydrocodone-acetaminophen], Codeine, Codeine, Tramadol, Tramadol, and Vicodin [hydrocodone-acetaminophen]  Review of Systems   Review of Systems  Constitutional: Negative for chills, diaphoresis and fever.  Respiratory: Negative for cough and shortness of breath.   Cardiovascular: Negative for chest pain.  Gastrointestinal: Negative for  abdominal pain, diarrhea, nausea and vomiting.  Neurological: Negative for dizziness, weakness and numbness.  Psychiatric/Behavioral: Positive for dysphoric mood and suicidal ideas. Negative for hallucinations.  All other systems reviewed and are negative.   Physical Exam Updated Vital Signs BP 117/75 (BP Location: Left Arm)   Pulse 91   Temp 99.8 F (37.7 C) (Oral)   Resp 16   Ht 6\' 1"  (1.854 m)   Wt 77.1 kg   SpO2 100%   BMI 22.43 kg/m   Physical Exam Vitals and nursing note reviewed.  Constitutional:      General: He is not in acute distress.    Appearance: He is well-developed. He is not diaphoretic.  HENT:     Head: Normocephalic and atraumatic.     Mouth/Throat:     Mouth: Mucous membranes are moist.     Pharynx: Oropharynx is clear.  Eyes:     Conjunctiva/sclera: Conjunctivae normal.  Cardiovascular:     Rate and Rhythm: Normal rate and regular rhythm.     Pulses: Normal pulses.          Radial pulses are 2+ on the right side and 2+ on the left side.     Heart sounds: Normal heart sounds.  Pulmonary:     Effort: Pulmonary effort is normal. No respiratory distress.     Breath sounds: Normal breath sounds.  Abdominal:     Palpations: Abdomen is soft.     Tenderness: There is no abdominal tenderness. There is no guarding.  Musculoskeletal:     Cervical back: Neck supple.  Lymphadenopathy:     Cervical: No cervical adenopathy.  Skin:    General: Skin is warm and dry.  Neurological:     Mental Status: He is alert and oriented to person, place, and time.  Psychiatric:        Mood and Affect: Mood and affect normal.        Speech: Speech normal.        Behavior: Behavior is cooperative.     ED Results / Procedures / Treatments   Labs (all labs ordered are listed, but only abnormal results are displayed) Labs Reviewed  COMPREHENSIVE METABOLIC PANEL - Abnormal; Notable for the following components:      Result Value   Potassium 3.3 (*)    Glucose, Bld 123  (*)    Calcium 8.7 (*)    Albumin 3.3 (*)    AST 54 (*)    ALT 82 (*)    All other components within normal limits  SALICYLATE LEVEL - Abnormal; Notable for the following components:   Salicylate Lvl <7.0 (*)    All other components within normal limits  ACETAMINOPHEN LEVEL - Abnormal; Notable for the following components:   Acetaminophen (Tylenol), Serum <10 (*)    All other components within normal limits  CBC - Abnormal; Notable for the following components:   Hemoglobin 12.3 (*)    HCT 38.1 (*)    All other components within normal limits  RAPID URINE DRUG SCREEN,  HOSP PERFORMED - Abnormal; Notable for the following components:   Tetrahydrocannabinol POSITIVE (*)    All other components within normal limits  SARS CORONAVIRUS 2 BY RT PCR (HOSPITAL ORDER, [place] [facility])  ETHANOL    Hemoglobin  Date Value Ref Range Status  01/20/2020 12.3 (L) 13.0 - 17.0 g/dL Final  01/02/2020 12.4 (L) 13.0 - 17.0 g/dL Final  12/08/2019 11.4 (L) 13.0 - 17.0 g/dL Final  10/30/2019 13.2 13.0 - 17.0 g/dL Final    ALT  Date Value Ref Range Status  01/20/2020 82 (H) 0 - 44 U/L Final  01/02/2020 107 (H) 0 - 44 U/L Final  12/08/2019 116 (H) 0 - 44 U/L Final  10/30/2019 91 (H) 0 - 44 U/L Final    AST  Date Value Ref Range Status  01/20/2020 54 (H) 15 - 41 U/L Final  01/02/2020 84 (H) 15 - 41 U/L Final  12/08/2019 86 (H) 15 - 41 U/L Final  10/30/2019 67 (H) 15 - 41 U/L Final    EKG None  Radiology No results found.  Procedures Procedures (including critical care time)  Medications Ordered in ED Medications  nicotine (NICODERM CQ - dosed in mg/24 hours) patch 21 mg (21 mg Transdermal Patch Applied 01/20/20 1007)  DULoxetine (CYMBALTA) DR capsule 30 mg (has no administration in time range)  potassium chloride SA (KLOR-CON) CR tablet 40 mEq (40 mEq Oral Given 01/20/20 0840)    ED Course  I have reviewed the triage vital signs and the nursing  notes.  Pertinent labs & imaging results that were available during my care of the patient were reviewed by me and considered in my medical decision making (see chart for details).    MDM Rules/Calculators/A&P                      Patient presents complaining of suicidal ideations with consistent heroin use.  Hypokalemia noted and addressed.  24-hour observation was initially recommended by psych team.  Peers support consultation was also recommended and this order was placed. Patient then states he does not want to go to [facility].  An alternative plan was decided upon between the patient and the psych team wherein patient will follow up as an outpatient at the [facility]. He contracts for safety here in the ED and has a safety plan that has been discussed.   Final Clinical Impression(s) / ED Diagnoses Final diagnoses:  Heroin use  Hypokalemia    Rx / DC Orders ED Discharge Orders    None       Layla Maw 01/20/20 1308    Little, Wenda Overland, MD 01/20/20 1556

## 2020-01-20 NOTE — Progress Notes (Signed)
Pt accepted to [facility] OBS, bed 200   Denzil Magnuson, NP is the accepting provider.    Dr. Lucianne Muss is the attending provider.    Call report to [phone]    Pt is voluntary and will be transported by [employer]  Pt is scheduled to arrive at [facility] at 1130am, but must test negative for Covid.   Wells Guiles, LCSW, LCAS Disposition CSW [facility]/TTS [phone]

## 2020-01-20 NOTE — ED Notes (Signed)
Report given to Vernona Rieger at [facility]

## 2020-01-20 NOTE — [facility] Counselor (Signed)
Per Denzil Magnuson, NP 24 hour observation is recommended. Also, peer support consult is recommended to discuss SA treatment options. Jonelle Sidle, RN is aware of the disposition plan.

## 2020-01-20 NOTE — ED Notes (Signed)
Sitter at bedside.

## 2020-01-20 NOTE — ED Notes (Signed)
RN and tech attempted to draw blood from pt twice with no luck.

## 2020-03-31 ENCOUNTER — Inpatient Hospital Stay (HOSPITAL_COMMUNITY)
Admission: EM | Admit: 2020-03-31 | Discharge: 2020-04-22 | DRG: 579 | Disposition: A | Discharge status: Home / Self Care | Payer: Self-pay | Attending: Internal Medicine | Admitting: Internal Medicine

## 2020-03-31 ENCOUNTER — Emergency Department (HOSPITAL_COMMUNITY): Payer: Self-pay

## 2020-03-31 ENCOUNTER — Encounter (HOSPITAL_COMMUNITY): Payer: Self-pay | Admitting: Emergency Medicine

## 2020-03-31 DIAGNOSIS — I82612 Acute embolism and thrombosis of superficial veins of left upper extremity: Secondary | ICD-10-CM | POA: Diagnosis not present

## 2020-03-31 DIAGNOSIS — Z590 Homelessness: Secondary | ICD-10-CM

## 2020-03-31 DIAGNOSIS — R5082 Postprocedural fever: Secondary | ICD-10-CM | POA: Diagnosis not present

## 2020-03-31 DIAGNOSIS — Z888 Allergy status to other drugs, medicaments and biological substances status: Secondary | ICD-10-CM

## 2020-03-31 DIAGNOSIS — R519 Headache, unspecified: Secondary | ICD-10-CM | POA: Diagnosis not present

## 2020-03-31 DIAGNOSIS — F1990 Other psychoactive substance use, unspecified, uncomplicated: Secondary | ICD-10-CM | POA: Diagnosis present

## 2020-03-31 DIAGNOSIS — F17200 Nicotine dependence, unspecified, uncomplicated: Secondary | ICD-10-CM | POA: Diagnosis present

## 2020-03-31 DIAGNOSIS — I330 Acute and subacute infective endocarditis: Secondary | ICD-10-CM | POA: Diagnosis present

## 2020-03-31 DIAGNOSIS — L539 Erythematous condition, unspecified: Secondary | ICD-10-CM

## 2020-03-31 DIAGNOSIS — U071 COVID-19: Secondary | ICD-10-CM | POA: Diagnosis present

## 2020-03-31 DIAGNOSIS — Z8614 Personal history of Methicillin resistant Staphylococcus aureus infection: Secondary | ICD-10-CM

## 2020-03-31 DIAGNOSIS — R509 Fever, unspecified: Secondary | ICD-10-CM

## 2020-03-31 DIAGNOSIS — R7989 Other specified abnormal findings of blood chemistry: Secondary | ICD-10-CM | POA: Diagnosis present

## 2020-03-31 DIAGNOSIS — F332 Major depressive disorder, recurrent severe without psychotic features: Secondary | ICD-10-CM | POA: Diagnosis present

## 2020-03-31 DIAGNOSIS — T402X5A Adverse effect of other opioids, initial encounter: Secondary | ICD-10-CM | POA: Diagnosis present

## 2020-03-31 DIAGNOSIS — L02413 Cutaneous abscess of right upper limb: Principal | ICD-10-CM | POA: Diagnosis present

## 2020-03-31 DIAGNOSIS — Z885 Allergy status to narcotic agent status: Secondary | ICD-10-CM

## 2020-03-31 DIAGNOSIS — S50851A Superficial foreign body of right forearm, initial encounter: Secondary | ICD-10-CM

## 2020-03-31 DIAGNOSIS — F1124 Opioid dependence with opioid-induced mood disorder: Secondary | ICD-10-CM | POA: Diagnosis present

## 2020-03-31 DIAGNOSIS — L03113 Cellulitis of right upper limb: Secondary | ICD-10-CM | POA: Diagnosis present

## 2020-03-31 DIAGNOSIS — R609 Edema, unspecified: Secondary | ICD-10-CM

## 2020-03-31 DIAGNOSIS — I079 Rheumatic tricuspid valve disease, unspecified: Secondary | ICD-10-CM | POA: Diagnosis present

## 2020-03-31 DIAGNOSIS — R945 Abnormal results of liver function studies: Secondary | ICD-10-CM | POA: Diagnosis present

## 2020-03-31 DIAGNOSIS — B9562 Methicillin resistant Staphylococcus aureus infection as the cause of diseases classified elsewhere: Secondary | ICD-10-CM | POA: Diagnosis present

## 2020-03-31 DIAGNOSIS — F1120 Opioid dependence, uncomplicated: Secondary | ICD-10-CM | POA: Diagnosis present

## 2020-03-31 DIAGNOSIS — R7881 Bacteremia: Secondary | ICD-10-CM | POA: Diagnosis present

## 2020-03-31 DIAGNOSIS — B182 Chronic viral hepatitis C: Secondary | ICD-10-CM | POA: Diagnosis present

## 2020-03-31 LAB — CBC WITH DIFFERENTIAL/PLATELET
Abs Immature Granulocytes: 0.05 10*3/uL (ref 0.00–0.07)
Basophils Absolute: 0 10*3/uL (ref 0.0–0.1)
Basophils Relative: 0 %
Eosinophils Absolute: 0.1 10*3/uL (ref 0.0–0.5)
Eosinophils Relative: 1 %
HCT: 33.1 % — ABNORMAL LOW (ref 39.0–52.0)
Hemoglobin: 10.4 g/dL — ABNORMAL LOW (ref 13.0–17.0)
Immature Granulocytes: 1 %
Lymphocytes Relative: 12 %
Lymphs Abs: 1.3 10*3/uL (ref 0.7–4.0)
MCH: 26.3 pg (ref 26.0–34.0)
MCHC: 31.4 g/dL (ref 30.0–36.0)
MCV: 83.6 fL (ref 80.0–100.0)
Monocytes Absolute: 0.8 10*3/uL (ref 0.1–1.0)
Monocytes Relative: 7 %
Neutro Abs: 8.5 10*3/uL — ABNORMAL HIGH (ref 1.7–7.7)
Neutrophils Relative %: 79 %
Platelets: 201 10*3/uL (ref 150–400)
RBC: 3.96 MIL/uL — ABNORMAL LOW (ref 4.22–5.81)
RDW: 15 % (ref 11.5–15.5)
WBC: 10.6 10*3/uL — ABNORMAL HIGH (ref 4.0–10.5)
nRBC: 0 % (ref 0.0–0.2)

## 2020-03-31 LAB — PROTIME-INR
INR: 1.2 (ref 0.8–1.2)
Prothrombin Time: 14.5 seconds (ref 11.4–15.2)

## 2020-03-31 NOTE — ED Triage Notes (Signed)
Patient presents with redness and swelling to R FA area present X3 days. History of IVDU, last used 3 days ago. History of MRSA.

## 2020-04-01 ENCOUNTER — Inpatient Hospital Stay (HOSPITAL_COMMUNITY): Payer: Self-pay | Admitting: Certified Registered Nurse Anesthetist

## 2020-04-01 ENCOUNTER — Encounter (HOSPITAL_COMMUNITY)
Admission: EM | Disposition: A | Discharge status: Home / Self Care | Payer: Self-pay | Source: Home / Self Care | Attending: Internal Medicine

## 2020-04-01 ENCOUNTER — Encounter (HOSPITAL_COMMUNITY): Payer: Self-pay | Admitting: Internal Medicine

## 2020-04-01 ENCOUNTER — Other Ambulatory Visit: Payer: Self-pay

## 2020-04-01 ENCOUNTER — Emergency Department (HOSPITAL_COMMUNITY): Payer: Self-pay

## 2020-04-01 DIAGNOSIS — R7881 Bacteremia: Secondary | ICD-10-CM

## 2020-04-01 DIAGNOSIS — B9562 Methicillin resistant Staphylococcus aureus infection as the cause of diseases classified elsewhere: Secondary | ICD-10-CM

## 2020-04-01 DIAGNOSIS — L03113 Cellulitis of right upper limb: Secondary | ICD-10-CM

## 2020-04-01 HISTORY — PX: I & D EXTREMITY: SHX5045

## 2020-04-01 LAB — COMPREHENSIVE METABOLIC PANEL
ALT: 71 U/L — ABNORMAL HIGH (ref 0–44)
AST: 48 U/L — ABNORMAL HIGH (ref 15–41)
Albumin: 2.9 g/dL — ABNORMAL LOW (ref 3.5–5.0)
Alkaline Phosphatase: 62 U/L (ref 38–126)
Anion gap: 6 (ref 5–15)
BUN: 6 mg/dL (ref 6–20)
CO2: 28 mmol/L (ref 22–32)
Calcium: 8.4 mg/dL — ABNORMAL LOW (ref 8.9–10.3)
Chloride: 100 mmol/L (ref 98–111)
Creatinine, Ser: 0.98 mg/dL (ref 0.61–1.24)
GFR calc Af Amer: >60 mL/min (ref >60)
GFR calc non Af Amer: >60 mL/min (ref >60)
Glucose, Bld: 113 mg/dL — ABNORMAL HIGH (ref 70–99)
Potassium: 4.1 mmol/L (ref 3.5–5.1)
Sodium: 134 mmol/L — ABNORMAL LOW (ref 135–145)
Total Bilirubin: 0.6 mg/dL (ref 0.3–1.2)
Total Protein: 7.1 g/dL (ref 6.5–8.1)

## 2020-04-01 LAB — BLOOD CULTURE ID PANEL (REFLEXED) - BCID2

## 2020-04-01 LAB — SARS CORONAVIRUS 2 BY RT PCR (HOSPITAL ORDER, PERFORMED IN ~~LOC~~ HOSPITAL LAB): SARS Coronavirus 2: POSITIVE — AB

## 2020-04-01 LAB — URINALYSIS, ROUTINE W REFLEX MICROSCOPIC
Bilirubin Urine: NEGATIVE
Glucose, UA: NEGATIVE mg/dL
Hgb urine dipstick: NEGATIVE
Ketones, ur: NEGATIVE mg/dL
Leukocytes,Ua: NEGATIVE
Nitrite: NEGATIVE
Protein, ur: NEGATIVE mg/dL
Specific Gravity, Urine: 1.017 (ref 1.005–1.030)
pH: 8 (ref 5.0–8.0)

## 2020-04-01 LAB — LACTIC ACID, PLASMA
Lactic Acid, Venous: 1.2 mmol/L (ref 0.5–1.9)
Lactic Acid, Venous: 1.1 mmol/L (ref 0.5–1.9)

## 2020-04-01 SURGERY — IRRIGATION AND DEBRIDEMENT EXTREMITY
Anesthesia: General | Laterality: Right

## 2020-04-01 MED ORDER — SUGAMMADEX SODIUM 200 MG/2ML IV SOLN
INTRAVENOUS | Status: DC | PRN
  Administered 2020-04-01: 325 mg via INTRAVENOUS

## 2020-04-01 MED ORDER — PROPOFOL 10 MG/ML IV BOLUS
INTRAVENOUS | Status: AC
  Filled 2020-04-01: qty 20

## 2020-04-01 MED ORDER — LIDOCAINE HCL (CARDIAC) PF 100 MG/5ML IV SOSY
PREFILLED_SYRINGE | INTRAVENOUS | Status: DC | PRN
  Administered 2020-04-01: 100 mg via INTRAVENOUS

## 2020-04-01 MED ORDER — PROMETHAZINE HCL 25 MG/ML IJ SOLN: 6.2500 mg | INTRAMUSCULAR | Status: DC | PRN

## 2020-04-01 MED ORDER — DEXMEDETOMIDINE HCL 200 MCG/2ML IV SOLN
INTRAVENOUS | Status: DC | PRN
  Administered 2020-04-01 (×5): 4 ug via INTRAVENOUS

## 2020-04-01 MED ORDER — FENTANYL CITRATE (PF) 250 MCG/5ML IJ SOLN
INTRAMUSCULAR | Status: AC
  Filled 2020-04-01: qty 5

## 2020-04-01 MED ORDER — POLYETHYLENE GLYCOL 3350 17 G PO PACK
17.0000 g | PACK | Freq: Every day | ORAL | Status: DC | PRN
  Filled 2020-04-01: qty 1

## 2020-04-01 MED ORDER — ONDANSETRON HCL 4 MG/2ML IJ SOLN
INTRAMUSCULAR | Status: DC | PRN
  Administered 2020-04-01: 4 mg via INTRAVENOUS

## 2020-04-01 MED ORDER — FENTANYL CITRATE (PF) 100 MCG/2ML IJ SOLN
INTRAMUSCULAR | Status: DC | PRN
  Administered 2020-04-01: 50 ug via INTRAVENOUS
  Administered 2020-04-01: 150 ug via INTRAVENOUS
  Administered 2020-04-01 (×2): 50 ug via INTRAVENOUS

## 2020-04-01 MED ORDER — MEPERIDINE HCL 25 MG/ML IJ SOLN: 6.2500 mg | INTRAMUSCULAR | Status: DC | PRN

## 2020-04-01 MED ORDER — LACTATED RINGERS IV SOLN: INTRAVENOUS | Status: DC | PRN

## 2020-04-01 MED ORDER — SODIUM CHLORIDE 0.9 % IV BOLUS
1000.0000 mL | Freq: Once | INTRAVENOUS | Status: AC
  Administered 2020-04-01: 1000 mL via INTRAVENOUS

## 2020-04-01 MED ORDER — DULOXETINE HCL 30 MG PO CPEP
30.0000 mg | ORAL_CAPSULE | Freq: Every day | ORAL | Status: DC
  Administered 2020-04-01 – 2020-04-22 (×22): 30 mg via ORAL
  Filled 2020-04-01 (×22): qty 1

## 2020-04-01 MED ORDER — SUCCINYLCHOLINE CHLORIDE 20 MG/ML IJ SOLN
INTRAMUSCULAR | Status: DC | PRN
  Administered 2020-04-01: 120 mg via INTRAVENOUS

## 2020-04-01 MED ORDER — MIDAZOLAM HCL 5 MG/5ML IJ SOLN
INTRAMUSCULAR | Status: DC | PRN
  Administered 2020-04-01: 2 mg via INTRAVENOUS

## 2020-04-01 MED ORDER — SODIUM CHLORIDE 0.9 % IR SOLN
Status: DC | PRN
  Administered 2020-04-01: 1000 mL
  Administered 2020-04-01: 3000 mL

## 2020-04-01 MED ORDER — HYDROMORPHONE HCL 1 MG/ML IJ SOLN
0.5000 mg | INTRAMUSCULAR | Status: DC | PRN
  Administered 2020-04-01 – 2020-04-02 (×4): 1 mg via INTRAVENOUS
  Filled 2020-04-01 (×4): qty 1

## 2020-04-01 MED ORDER — PIPERACILLIN-TAZOBACTAM 3.375 G IVPB
3.3750 g | Freq: Three times a day (TID) | INTRAVENOUS | Status: DC
  Administered 2020-04-01 (×2): 3.375 g via INTRAVENOUS
  Filled 2020-04-01 (×2): qty 50

## 2020-04-01 MED ORDER — DEXAMETHASONE SODIUM PHOSPHATE 4 MG/ML IJ SOLN
INTRAMUSCULAR | Status: DC | PRN
  Administered 2020-04-01: 5 mg via INTRAVENOUS

## 2020-04-01 MED ORDER — FENTANYL CITRATE (PF) 100 MCG/2ML IJ SOLN
25.0000 ug | INTRAMUSCULAR | Status: DC | PRN
  Administered 2020-04-01 (×2): 50 ug via INTRAVENOUS

## 2020-04-01 MED ORDER — MIDAZOLAM HCL 2 MG/2ML IJ SOLN
INTRAMUSCULAR | Status: AC
  Filled 2020-04-01: qty 2

## 2020-04-01 MED ORDER — FENTANYL CITRATE (PF) 100 MCG/2ML IJ SOLN
INTRAMUSCULAR | Status: AC
  Filled 2020-04-01: qty 2

## 2020-04-01 MED ORDER — IOHEXOL 350 MG/ML SOLN
100.0000 mL | Freq: Once | INTRAVENOUS | Status: AC | PRN
  Administered 2020-04-01: 80 mL via INTRAVENOUS

## 2020-04-01 MED ORDER — HEPARIN SODIUM (PORCINE) 5000 UNIT/ML IJ SOLN
5000.0000 [IU] | Freq: Three times a day (TID) | INTRAMUSCULAR | Status: DC
  Filled 2020-04-01 (×3): qty 1

## 2020-04-01 MED ORDER — VANCOMYCIN HCL 1500 MG/300ML IV SOLN
1500.0000 mg | Freq: Two times a day (BID) | INTRAVENOUS | Status: DC
  Administered 2020-04-01 – 2020-04-03 (×6): 1500 mg via INTRAVENOUS
  Filled 2020-04-01 (×7): qty 300

## 2020-04-01 MED ORDER — PROPOFOL 10 MG/ML IV BOLUS
INTRAVENOUS | Status: DC | PRN
  Administered 2020-04-01: 100 mg via INTRAVENOUS
  Administered 2020-04-01: 200 mg via INTRAVENOUS

## 2020-04-01 MED ORDER — ACETAMINOPHEN 325 MG PO TABS
650.0000 mg | ORAL_TABLET | Freq: Once | ORAL | Status: AC
  Administered 2020-04-01: 650 mg via ORAL
  Filled 2020-04-01: qty 2

## 2020-04-01 MED ORDER — FENTANYL CITRATE (PF) 100 MCG/2ML IJ SOLN
100.0000 ug | Freq: Once | INTRAMUSCULAR | Status: AC
  Administered 2020-04-01: 100 ug via INTRAVENOUS
  Filled 2020-04-01: qty 2

## 2020-04-01 MED ORDER — ROCURONIUM BROMIDE 100 MG/10ML IV SOLN
INTRAVENOUS | Status: DC | PRN
  Administered 2020-04-01: 40 mg via INTRAVENOUS

## 2020-04-01 MED ORDER — PIPERACILLIN-TAZOBACTAM 3.375 G IVPB 30 MIN
3.3750 g | Freq: Once | INTRAVENOUS | Status: AC
  Administered 2020-04-01: 3.375 g via INTRAVENOUS
  Filled 2020-04-01: qty 50

## 2020-04-01 SURGICAL SUPPLY — 59 items
BNDG CMPR 9X4 STRL LF SNTH (GAUZE/BANDAGES/DRESSINGS) ×1
BNDG COHESIVE 1X5 TAN STRL LF (GAUZE/BANDAGES/DRESSINGS) IMPLANT
BNDG CONFORM 2 STRL LF (GAUZE/BANDAGES/DRESSINGS) IMPLANT
BNDG ELASTIC 3X5.8 VLCR STR LF (GAUZE/BANDAGES/DRESSINGS) ×3 IMPLANT
BNDG ELASTIC 4X5.8 VLCR STR LF (GAUZE/BANDAGES/DRESSINGS) ×3 IMPLANT
BNDG ESMARK 4X9 LF (GAUZE/BANDAGES/DRESSINGS) ×3 IMPLANT
BNDG GAUZE ELAST 4 BULKY (GAUZE/BANDAGES/DRESSINGS) ×3 IMPLANT
CORD BIPOLAR FORCEPS 12FT (ELECTRODE) ×3 IMPLANT
COVER SURGICAL LIGHT HANDLE (MISCELLANEOUS) ×3 IMPLANT
COVER WAND RF STERILE (DRAPES) ×3 IMPLANT
CUFF TOURN SGL QUICK 18X4 (TOURNIQUET CUFF) ×3 IMPLANT
CUFF TOURN SGL QUICK 24 (TOURNIQUET CUFF)
CUFF TRNQT CYL 24X4X16.5-23 (TOURNIQUET CUFF) IMPLANT
DRAIN PENROSE 1/4X12 LTX STRL (WOUND CARE) IMPLANT
DRAPE SURG 17X23 STRL (DRAPES) ×3 IMPLANT
DRSG ADAPTIC 3X8 NADH LF (GAUZE/BANDAGES/DRESSINGS) ×3 IMPLANT
ELECT REM PT RETURN 9FT ADLT (ELECTROSURGICAL)
ELECTRODE REM PT RTRN 9FT ADLT (ELECTROSURGICAL) IMPLANT
GAUZE SPONGE 4X4 12PLY STRL (GAUZE/BANDAGES/DRESSINGS) ×3 IMPLANT
GAUZE XEROFORM 1X8 LF (GAUZE/BANDAGES/DRESSINGS) ×1 IMPLANT
GAUZE XEROFORM 5X9 LF (GAUZE/BANDAGES/DRESSINGS) IMPLANT
GLOVE BIOGEL PI IND STRL 8.5 (GLOVE) ×1 IMPLANT
GLOVE BIOGEL PI INDICATOR 8.5 (GLOVE) ×2
GLOVE SURG ORTHO 8.0 STRL STRW (GLOVE) ×3 IMPLANT
GOWN STRL REUS W/ TWL LRG LVL3 (GOWN DISPOSABLE) ×3 IMPLANT
GOWN STRL REUS W/ TWL XL LVL3 (GOWN DISPOSABLE) ×1 IMPLANT
GOWN STRL REUS W/TWL LRG LVL3 (GOWN DISPOSABLE) ×9
GOWN STRL REUS W/TWL XL LVL3 (GOWN DISPOSABLE) ×3
HANDPIECE INTERPULSE COAX TIP (DISPOSABLE)
KIT BASIN OR (CUSTOM PROCEDURE TRAY) ×3 IMPLANT
KIT TURNOVER KIT B (KITS) ×3 IMPLANT
MANIFOLD NEPTUNE II (INSTRUMENTS) ×3 IMPLANT
NDL HYPO 25GX1X1/2 BEV (NEEDLE) IMPLANT
NEEDLE HYPO 25GX1X1/2 BEV (NEEDLE) IMPLANT
NS IRRIG 1000ML POUR BTL (IV SOLUTION) ×3 IMPLANT
PACK ORTHO EXTREMITY (CUSTOM PROCEDURE TRAY) ×3 IMPLANT
PAD ARMBOARD 7.5X6 YLW CONV (MISCELLANEOUS) ×6 IMPLANT
PAD CAST 4YDX4 CTTN HI CHSV (CAST SUPPLIES) ×1 IMPLANT
PADDING CAST COTTON 4X4 STRL (CAST SUPPLIES)
SET CYSTO W/LG BORE CLAMP LF (SET/KITS/TRAYS/PACK) IMPLANT
SET HNDPC FAN SPRY TIP SCT (DISPOSABLE) IMPLANT
SOAP 2 % CHG 4 OZ (WOUND CARE) ×3 IMPLANT
SPONGE LAP 18X18 RF (DISPOSABLE) ×3 IMPLANT
SPONGE LAP 4X18 RFD (DISPOSABLE) ×3 IMPLANT
SUT ETHILON 4 0 PS 2 18 (SUTURE) IMPLANT
SUT ETHILON 5 0 P 3 18 (SUTURE)
SUT NYLON ETHILON 5-0 P-3 1X18 (SUTURE) IMPLANT
SUT PROLENE 3 0 PS 2 (SUTURE) ×2 IMPLANT
SUT VICRYL AB 2 0 TIES (SUTURE) ×2 IMPLANT
SWAB COLLECTION DEVICE MRSA (MISCELLANEOUS) ×3 IMPLANT
SWAB CULTURE ESWAB REG 1ML (MISCELLANEOUS) ×2 IMPLANT
SYR CONTROL 10ML LL (SYRINGE) IMPLANT
TOWEL GREEN STERILE (TOWEL DISPOSABLE) ×3 IMPLANT
TOWEL GREEN STERILE FF (TOWEL DISPOSABLE) ×3 IMPLANT
TUBE CONNECTING 12'X1/4 (SUCTIONS) ×1
TUBE CONNECTING 12X1/4 (SUCTIONS) ×2 IMPLANT
UNDERPAD 30X36 HEAVY ABSORB (UNDERPADS AND DIAPERS) ×3 IMPLANT
WATER STERILE IRR 1000ML POUR (IV SOLUTION) ×3 IMPLANT
YANKAUER SUCT BULB TIP NO VENT (SUCTIONS) ×3 IMPLANT

## 2020-04-01 NOTE — Transfer of Care (Signed)
Immediate Anesthesia Transfer of Care Note  Patient: Jon Castro  Procedure(s) Performed: IRRIGATION AND DEBRIDEMENT OF ELBOW (Right )  Patient Location: OR  Anesthesia Type:General  Level of Consciousness: awake, alert , oriented and patient cooperative  Airway & Oxygen Therapy: Patient Spontanous Breathing  Post-op Assessment: Report given to RN and Post -op Vital signs reviewed and stable  Post vital signs: Reviewed and stable    Last Vitals:  Vitals Value Taken Time  BP 110/69 04/01/20 1756  Temp 37 C 04/01/20 1756  Pulse 93 04/01/20 1756  Resp 14 04/01/20 1756  SpO2 99 % 04/01/20 1756    Last Pain:  Vitals:   04/01/20 1756  TempSrc:   PainSc: 8          Complications: No complications documented.   PACU RN at bedside to recover patient in OR

## 2020-04-01 NOTE — Anesthesia Preprocedure Evaluation (Signed)
Anesthesia Evaluation  Patient identified by MRN, date of birth, ID band Patient awake    Reviewed: Allergy & Precautions, NPO status , Patient's Chart, lab work & pertinent test results, Unable to perform ROS - Chart review only  History of Anesthesia Complications Negative for: history of anesthetic complications  Airway Mallampati: II  TM Distance: >3 FB Neck ROM: Full    Dental  (+) Poor Dentition, Chipped, Missing, Dental Advisory Given   Pulmonary Current Smoker and Patient abstained from smoking.,  04/28/2019 SARS coronavirus NEG   breath sounds clear to auscultation       Cardiovascular (-) hypertension Rhythm:Regular Rate:Normal  04/29/2019 ECHO: EF 65%, valves OK   Neuro/Psych PSYCHIATRIC DISORDERS Depression negative neurological ROS     GI/Hepatic negative GI ROS, (+)     substance abuse  marijuana use and IV drug use, Elevated LFTs   Endo/Other  negative endocrine ROS  Renal/GU negative Renal ROS     Musculoskeletal  (+) narcotic dependent  Abdominal   Peds  Hematology negative hematology ROS (+)   Anesthesia Other Findings   Reproductive/Obstetrics                             Anesthesia Physical  Anesthesia Plan  ASA: III  Anesthesia Plan: General   Post-op Pain Management:    Induction: Intravenous, Rapid sequence and Cricoid pressure planned  PONV Risk Score and Plan: 0 and Treatment may vary due to age or medical condition, Ondansetron, Dexamethasone and Midazolam  Airway Management Planned: Oral ETT  Additional Equipment: None  Intra-op Plan:   Post-operative Plan: Extubation in OR  Informed Consent: I have reviewed the patients History and Physical, chart, labs and discussed the procedure including the risks, benefits and alternatives for the proposed anesthesia with the patient or authorized representative who has indicated his/her understanding and  acceptance.     Dental advisory given  Plan Discussed with: CRNA  Anesthesia Plan Comments:         Anesthesia Quick Evaluation

## 2020-04-01 NOTE — Consult Note (Addendum)
Jon Castro is an 34 y.o. male.   Chief Complaint:  RIGHT FOREARM PAIN  HPI: The patient is a 34 y/o male who last used IV drugs approximately 3 days ago. He has a past history of MRSA and bacteremia. He is also currently positive for COVID. He noticed the pain in the right forearm several days ago and it has continued to worsen. He has not tried any treatment for it. He complains or pain and swelling but denies numbness or weakness of the arm.   Past Medical History:  Diagnosis Date  . Back pain   . Drug abuse (HCC)   . IV drug abuse (HCC)   . IVDU (intravenous drug user)   . LFTs abnormal     Past Surgical History:  Procedure Laterality Date  . I & D EXTREMITY Right 04/28/2019   Procedure: IRRIGATION AND DEBRIDEMENT ARM ABSCESS;  Surgeon: Sheral Apley, MD;  Location: [facility] OR;  Service: Orthopedics;  Laterality: Right;  . TEE WITHOUT CARDIOVERSION N/A 05/03/2019   Procedure: TRANSESOPHAGEAL ECHOCARDIOGRAM (TEE);  Surgeon: Jake Bathe, MD;  Location: [facility] ENDOSCOPY;  Service: Cardiovascular;  Laterality: N/A;    No family history on file. Social History:  reports that he has been smoking. He uses smokeless tobacco. He reports current alcohol use. He reports current drug use. Drugs: IV, Cocaine, Amphetamines, Marijuana, Heroin, and Methamphetamines.  Allergies:  Allergies  Allergen Reactions  . Vicodin [Hydrocodone-Acetaminophen] Itching  . Codeine Nausea And Vomiting  . Codeine   . Tramadol Other (See Comments)    "messes with head"  . Tramadol   . Vicodin [Hydrocodone-Acetaminophen]     (Not in a hospital admission)   Results for orders placed or performed during the hospital encounter of 03/31/20 (from the past 48 hour(s))  Urinalysis, Routine w reflex microscopic     Status: Abnormal   Collection Time: 03/31/20 11:08 PM  Result Value Ref Range   Color, Urine YELLOW YELLOW   APPearance CLOUDY (A) CLEAR   Specific Gravity, Urine 1.017 1.005 - 1.030   pH 8.0  5.0 - 8.0   Glucose, UA NEGATIVE NEGATIVE mg/dL   Hgb urine dipstick NEGATIVE NEGATIVE   Bilirubin Urine NEGATIVE NEGATIVE   Ketones, ur NEGATIVE NEGATIVE mg/dL   Protein, ur NEGATIVE NEGATIVE mg/dL   Nitrite NEGATIVE NEGATIVE   Leukocytes,Ua NEGATIVE NEGATIVE   WBC, UA 0-5 0 - 5 WBC/hpf   Bacteria, UA RARE (A) NONE SEEN   Squamous Epithelial / LPF 0-5 0 - 5    Comment: Performed at [facility], [address] [place], [place] [address]  Comprehensive metabolic panel     Status: Abnormal   Collection Time: 03/31/20 11:16 PM  Result Value Ref Range   Sodium 134 (L) 135 - 145 mmol/L   Potassium 4.1 3.5 - 5.1 mmol/L   Chloride 100 98 - 111 mmol/L   CO2 28 22 - 32 mmol/L   Glucose, Bld 113 (H) 70 - 99 mg/dL    Comment: Glucose reference range applies only to samples taken after fasting for at least 8 hours.   BUN 6 6 - 20 mg/dL   Creatinine, Ser 8.36 0.61 - 1.24 mg/dL   Calcium 8.4 (L) 8.9 - 10.3 mg/dL   Total Protein 7.1 6.5 - 8.1 g/dL   Albumin 2.9 (L) 3.5 - 5.0 g/dL   AST 48 (H) 15 - 41 U/L   ALT 71 (H) 0 - 44 U/L   Alkaline Phosphatase 62 38 -  126 U/L   Total Bilirubin 0.6 0.3 - 1.2 mg/dL   GFR calc non Af Amer >60 >60 mL/min   GFR calc Af Amer >60 >60 mL/min   Anion gap 6 5 - 15    Comment: Performed at [facility], [address], [place], [place] [address]  Lactic acid, plasma     Status: None   Collection Time: 03/31/20 11:16 PM  Result Value Ref Range   Lactic Acid, Venous 1.2 0.5 - 1.9 mmol/L    Comment: Performed at [facility], [address], [place], [place] [address]  CBC with Differential     Status: Abnormal   Collection Time: 03/31/20 11:16 PM  Result Value Ref Range   WBC 10.6 (H) 4.0 - 10.5 K/uL   RBC 3.96 (L) 4.22 - 5.81 MIL/uL   Hemoglobin 10.4 (L) 13.0 - 17.0 g/dL   HCT 32.2 (L) 39 - 52 %   MCV 83.6 80.0 - 100.0 fL   MCH 26.3 26.0 - 34.0 pg   MCHC 31.4 30.0 - 36.0 g/dL   RDW 02.5 42.7 - 06.2 %   Platelets 201 150 - 400 K/uL    nRBC 0.0 0.0 - 0.2 %   Neutrophils Relative % 79 %   Neutro Abs 8.5 (H) 1.7 - 7.7 K/uL   Lymphocytes Relative 12 %   Lymphs Abs 1.3 0.7 - 4.0 K/uL   Monocytes Relative 7 %   Monocytes Absolute 0.8 0 - 1 K/uL   Eosinophils Relative 1 %   Eosinophils Absolute 0.1 0 - 0 K/uL   Basophils Relative 0 %   Basophils Absolute 0.0 0 - 0 K/uL   Immature Granulocytes 1 %   Abs Immature Granulocytes 0.05 0.00 - 0.07 K/uL    Comment: Performed at [facility], [address] [place], [place] [address]  Protime-INR     Status: None   Collection Time: 03/31/20 11:16 PM  Result Value Ref Range   Prothrombin Time 14.5 11.4 - 15.2 seconds   INR 1.2 0.8 - 1.2    Comment: (NOTE) INR goal varies based on device and disease states. Performed at [facility], [address] [place], [place] [address]   Lactic acid, plasma     Status: None   Collection Time: 04/01/20  8:17 AM  Result Value Ref Range   Lactic Acid, Venous 1.1 0.5 - 1.9 mmol/L    Comment: Performed at [facility], [address] [place], [place] [address]  SARS Coronavirus 2 by RT PCR (hospital order, performed in [facility] hospital lab) Nasopharyngeal Nasopharyngeal Swab     Status: Abnormal   Collection Time: 04/01/20  8:17 AM   Specimen: Nasopharyngeal Swab  Result Value Ref Range   SARS Coronavirus 2 POSITIVE (A) NEGATIVE    Comment: RESULT CALLED TO, READ BACK BY AND VERIFIED WITH: Cherlyn Labella RN 11:00 04/01/20 ([email or web address]) (NOTE) SARS-CoV-2 target nucleic acids are DETECTED  SARS-CoV-2 RNA is generally detectable in upper respiratory specimens  during the acute phase of infection.  Positive results are indicative  of the presence of the identified virus, but do not rule out bacterial infection or co-infection with other pathogens not detected by the test.  Clinical correlation with patient history and  other diagnostic information is necessary to determine patient infection status.  The expected result is  negative.  Fact Sheet for Patients:   BoilerBrush.com.cy   Fact Sheet for Healthcare Providers:  https://pope.com/    This test is not yet approved or cleared by the [place] and  has been authorized for detection and/or diagnosis of SARS-CoV-2 by FDA under an Emergency Use Authorization (EUA).  This EUA will remain in effect (meaning this  test can be used) for the duration of  the COVID-19 declaration under Section 564(b)(1) of the Act, 21 U.S.C. section 360-bbb-3(b)(1), unless the authorization is terminated or revoked sooner.  Performed at [facility], [address] [place], [place] [address]    DG Chest 2 View  Result Date: 03/31/2020 CLINICAL DATA:  Redness and swelling to the R FA area x3 days. EXAM: CHEST - 2 VIEW COMPARISON:  December 09, 2019 FINDINGS: There is no evidence of acute infiltrate, pleural effusion or pneumothorax. The heart size and mediastinal contours are within normal limits. The visualized skeletal structures are unremarkable. IMPRESSION: No active cardiopulmonary disease. Electronically Signed   By: Aram Candela M.D.   On: 03/31/2020 23:25   DG Elbow 2 Views Right  Result Date: 04/01/2020 CLINICAL DATA:  IV drug user EXAM: RIGHT ELBOW - 2 VIEW COMPARISON:  December 09, 2019 FINDINGS: No acute fracture or dislocation. Joint spaces and alignment are maintained. No area of erosion or osseous destruction. There is a 7 mm linear metallic density projecting over the antecubital fossa soft tissues. Soft tissue edema throughout the arm. No subcutaneous air. IMPRESSION: 1. 7 mm linear metallic density projecting over the antecubital fossa soft tissues consistent with retained foreign body. 2. Soft tissue edema throughout the arm. No acute osseous abnormality or radiographic evidence of osteomyelitis. Electronically Signed   By: Meda Klinefelter MD   On: 04/01/2020 08:59   CT Angio Chest PE W and/or Wo  Contrast  Result Date: 04/01/2020 CLINICAL DATA:  Pt to ED c/o redness/swelling to RIGHT Forearm. Hx of IVDU, most recently 3 days ago. EXAM: CT ANGIOGRAPHY CHEST WITH CONTRAST TECHNIQUE: Multidetector CT imaging of the chest was performed using the standard protocol during bolus administration of intravenous contrast. Multiplanar CT image reconstructions and MIPs were obtained to evaluate the vascular anatomy. CONTRAST:  19mL OMNIPAQUE IOHEXOL 350 MG/ML SOLN COMPARISON:  None. FINDINGS: Cardiovascular: Satisfactory opacification of the pulmonary arteries to the segmental level. No evidence of pulmonary embolism. Normal heart size. No pericardial effusion. Mediastinum/Nodes: Mildly enlarged right axillary lymph nodes may be reactive. Thyroid gland, trachea, and esophagus demonstrate no significant findings. Lungs/Pleura: Lungs are clear. No pleural effusion or pneumothorax. Upper Abdomen: Enlarged spleen.  Otherwise unremarkable. Musculoskeletal: No chest wall abnormality. No acute or significant osseous findings. Review of the MIP images confirms the above findings. IMPRESSION: 1. No evidence of pulmonary embolism or other acute intrathoracic process. 2. Mildly enlarged right axillary lymph nodes may be reactive. 3. Splenomegaly. Electronically Signed   By: Emmaline Kluver M.D.   On: 04/01/2020 12:20    ROS NO RECENT ILLNESSES OR HOSPITALIZATIONS  Blood pressure 117/77, pulse 96, temperature 99 F (37.2 C), temperature source Oral, resp. rate (!) 22, height 6\' 1"  (1.854 m), weight 79.4 kg, SpO2 99 %. Physical Exam  General Appearance:  Alert, cooperative, no distress, appears stated age  Head:  Normocephalic, without obvious abnormality, atraumatic  Eyes:  Pupils equal, conjunctiva/corneas clear,         Throat: Lips, mucosa, and tongue normal; teeth and gums normal  Neck: No visible masses     Lungs:   respirations unlabored  Chest Wall:  No tenderness or deformity  Heart:  Regular  rate and  rhythm,  Abdomen:   Soft, non-tender,         Extremities: RUE - SWELLING AND ERYTHEMA OF THE ANTECUBITAL FOSSA WITHOUT DRAINAGE. SENSATION INTACT TO LIGHT TOUCH DISTALLY. CAPILLARY REFILL LESS THAN 2 SECONDS. LIMITED FLEXION AND EXTENSION OF THE ELBOW DUE TO PAIN. ABLE TO MAKE A FULL FIST, CROSS FINGERS, AND ABDUCT THUMB. TENDERNESS TO PALPATION OF THE ERYTHEMATOUS AREA. INDURATION PRESENT.  Pulses: 2+ and symmetric  Skin: Skin color, texture, turgor normal, no rashes or lesions     Neurologic: Normal    Assessment/Plan  RIGHT ELBOW ABSCESS  - DISCUSSED SURGICAL INTERVENTION WITH THE PATIENT. DISCUSSED THE RISKS VERSUS BENEFITS AND THE POST-OPERATIVE RECOVERY. THE PATIENT IS COVID POSITIVE. - CONTINUE WITH ANTIBIOTIC TREATMENT - CONTINUE WITH TREATMENT PER THE MEDICINE TEAM  WE ARE PLANNING SURGERY FOR YOUR UPPER EXTREMITY. THE RISKS AND BENEFITS OF SURGERY INCLUDE BUT NOT LIMITED TO BLEEDING INFECTION, DAMAGE TO NEARBY NERVES ARTERIES TENDONS, FAILURE OF SURGERY TO ACCOMPLISH ITS INTENDED GOALS, PERSISTENT SYMPTOMS AND NEED FOR FURTHER SURGICAL INTERVENTION. WITH THIS IN MIND WE WILL PROCEED. I HAVE DISCUSSED WITH THE PATIENT THE PRE AND POSTOPERATIVE REGIMEN AND THE DOS AND DON'TS. PT VOICED UNDERSTANDING AND INFORMED CONSENT SIGNED.  Bradly BienenstockFRED Carthel Castille MD 04/01/20   Karma GreaserSamantha Bonham Barton 04/01/2020, 3:14 PM

## 2020-04-01 NOTE — ED Provider Notes (Signed)
[facility] EMERGENCY DEPARTMENT Provider Note   CSN: [id number] Arrival date & time: 03/31/20  2216     History Chief Complaint  Patient presents with  . Arm Pain    Story Vanvranken is a 34 y.o. male.  HPI      34 year old male with history of IV drug use, MRSA bacteremia, fungemia, major depressive disorder, presents with concern for pain and erythema to right forearm, fever, and cough.  Reports for the last 3 days he has had developing pain and erythema to his right forearm, has had cough productive of yellow sputum for last 3 days. No nausea, vomiting, abdominal pain or back pain.  Feels fatigue, cough and pain in arm.  Subjective fevers, not sure he was febrile until arrival here.  Past Medical History:  Diagnosis Date  . Back pain   . Drug abuse (HCC)   . IV drug abuse (HCC)   . IVDU (intravenous drug user)   . LFTs abnormal     Patient Active Problem List   Diagnosis Date Noted  . MDD (major depressive disorder), recurrent severe, without psychosis (HCC) 10/30/2019  . Suicidal ideation 10/30/2019  . Cellulitis 10/13/2019  . Overdose of heroin, accidental or unintentional, initial encounter (HCC) 05/31/2019  . Fungemia 05/02/2019  . Blood culture positive for Candida species 05/02/2019  . MRSA bacteremia 04/29/2019  . IVDU (intravenous drug user) 04/29/2019  . Abscess of right forearm 04/28/2019  . Hyperglycemia 04/28/2019  . Leucocytosis 04/28/2019  . LFTs abnormal 04/28/2019  . Opioid use disorder, severe, dependence (HCC) 03/05/2019  . Opioid dependence with opioid-induced mood disorder (HCC)   . MDD (major depressive disorder), severe (HCC) 02/07/2019    Past Surgical History:  Procedure Laterality Date  . I & D EXTREMITY Right 04/28/2019   Procedure: IRRIGATION AND DEBRIDEMENT ARM ABSCESS;  Surgeon: Sheral Apley, MD;  Location: [facility] OR;  Service: Orthopedics;  Laterality: Right;  . TEE WITHOUT CARDIOVERSION N/A 05/03/2019    Procedure: TRANSESOPHAGEAL ECHOCARDIOGRAM (TEE);  Surgeon: Jake Bathe, MD;  Location: [facility] ENDOSCOPY;  Service: Cardiovascular;  Laterality: N/A;       No family history on file.  Social History   Tobacco Use  . Smoking status: Current Every Day Smoker  . Smokeless tobacco: Current User  Vaping Use  . Vaping Use: Never used  Substance Use Topics  . Alcohol use: Yes  . Drug use: Yes    Types: IV, Cocaine, Amphetamines, Marijuana, Heroin, Methamphetamines    Comment: HEROIN    Home Medications Prior to Admission medications   Medication Sig Start Date End Date Taking? Authorizing Provider  DULoxetine (CYMBALTA) 30 MG capsule Take 1 capsule (30 mg total) by mouth daily. Patient not taking: Reported on 01/02/2020 11/04/19   Aldean Baker, NP  nicotine (NICODERM CQ - DOSED IN MG/24 HOURS) 14 mg/24hr patch Place 1 patch (14 mg total) onto the skin daily. Patient not taking: Reported on 01/02/2020 11/04/19   Aldean Baker, NP  cloNIDine (CATAPRES) 0.1 MG tablet Take 1 tablet (0.1 mg total) by mouth 3 (three) times daily for 5 days. Patient not taking: Reported on 03/24/2019 03/08/19 03/24/19  Virgina Norfolk, DO  traZODone (DESYREL) 50 MG tablet Take 1 tablet (50 mg total) by mouth at bedtime as needed for sleep. Patient not taking: Reported on 02/21/2019 02/11/19 03/24/19  Aldean Baker, NP    Allergies    Vicodin [hydrocodone-acetaminophen], Codeine, Codeine, Tramadol, Tramadol, and Vicodin [hydrocodone-acetaminophen]  Review of  Systems   Review of Systems  Constitutional: Positive for fever.  HENT: Negative for sore throat.   Eyes: Negative for visual disturbance.  Respiratory: Positive for cough. Negative for shortness of breath.   Cardiovascular: Negative for chest pain.  Gastrointestinal: Negative for abdominal pain.  Genitourinary: Negative for difficulty urinating.  Musculoskeletal: Positive for arthralgias. Negative for back pain and neck stiffness.  Skin: Positive for rash.    Neurological: Negative for syncope and headaches.    Physical Exam Updated Vital Signs BP 121/87   Pulse 82   Temp 99 F (37.2 C) (Oral)   Resp (!) 26   Ht 6\' 1"  (1.854 m)   Wt 79.4 kg   SpO2 98%   BMI 23.09 kg/m   Physical Exam Vitals and nursing note reviewed.  Constitutional:      General: He is not in acute distress.    Appearance: He is well-developed. He is not diaphoretic.  HENT:     Head: Normocephalic and atraumatic.  Eyes:     Conjunctiva/sclera: Conjunctivae normal.  Cardiovascular:     Rate and Rhythm: Normal rate and regular rhythm.     Heart sounds: Normal heart sounds. No murmur heard.  No friction rub. No gallop.   Pulmonary:     Effort: Pulmonary effort is normal. No respiratory distress.     Breath sounds: Normal breath sounds. No wheezing or rales.  Abdominal:     General: There is no distension.     Palpations: Abdomen is soft.     Tenderness: There is no abdominal tenderness. There is no guarding.  Musculoskeletal:     Cervical back: Normal range of motion.  Skin:    General: Skin is warm and dry.     Findings: Erythema (erythema, fluctuance and induration to right AC) present.  Neurological:     Mental Status: He is alert and oriented to person, place, and time.     ED Results / Procedures / Treatments   Labs (all labs ordered are listed, but only abnormal results are displayed) Labs Reviewed  SARS CORONAVIRUS 2 BY RT PCR (HOSPITAL ORDER, PERFORMED IN [facility]) - Abnormal; Notable for the following components:      Result Value   SARS Coronavirus 2 POSITIVE (*)    All other components within normal limits  COMPREHENSIVE METABOLIC PANEL - Abnormal; Notable for the following components:   Sodium 134 (*)    Glucose, Bld 113 (*)    Calcium 8.4 (*)    Albumin 2.9 (*)    AST 48 (*)    ALT 71 (*)    All other components within normal limits  CBC WITH DIFFERENTIAL/PLATELET - Abnormal; Notable for the following components:    WBC 10.6 (*)    RBC 3.96 (*)    Hemoglobin 10.4 (*)    HCT 33.1 (*)    Neutro Abs 8.5 (*)    All other components within normal limits  URINALYSIS, ROUTINE W REFLEX MICROSCOPIC - Abnormal; Notable for the following components:   APPearance CLOUDY (*)    Bacteria, UA RARE (*)    All other components within normal limits  CULTURE, BLOOD (ROUTINE X 2)  CULTURE, BLOOD (ROUTINE X 2)  LACTIC ACID, PLASMA  LACTIC ACID, PLASMA  PROTIME-INR  FERRITIN  D-DIMER, QUANTITATIVE (NOT AT [facility])  FIBRINOGEN    EKG EKG Interpretation  Date/Time:  Wednesday April 01 2020 08:30:02 EDT Ventricular Rate:  92 PR Interval:    QRS Duration: 89 QT  Interval:  350 QTC Calculation: 433 R Axis:   90 Text Interpretation: Sinus rhythm Borderline right axis deviation Consider left ventricular hypertrophy ST elev, probable normal early repol pattern No significant change since last tracing Confirmed by Alvira MondaySchlossman, Rola Lennon ([id number]) on 04/01/2020 10:58:02 AM   Radiology DG Chest 2 View  Result Date: 03/31/2020 CLINICAL DATA:  Redness and swelling to the R FA area x3 days. EXAM: CHEST - 2 VIEW COMPARISON:  December 09, 2019 FINDINGS: There is no evidence of acute infiltrate, pleural effusion or pneumothorax. The heart size and mediastinal contours are within normal limits. The visualized skeletal structures are unremarkable. IMPRESSION: No active cardiopulmonary disease. Electronically Signed   By: Aram Candelahaddeus  Houston M.D.   On: 03/31/2020 23:25   DG Elbow 2 Views Right  Result Date: 04/01/2020 CLINICAL DATA:  IV drug user EXAM: RIGHT ELBOW - 2 VIEW COMPARISON:  December 09, 2019 FINDINGS: No acute fracture or dislocation. Joint spaces and alignment are maintained. No area of erosion or osseous destruction. There is a 7 mm linear metallic density projecting over the antecubital fossa soft tissues. Soft tissue edema throughout the arm. No subcutaneous air. IMPRESSION: 1. 7 mm linear metallic density projecting over the  antecubital fossa soft tissues consistent with retained foreign body. 2. Soft tissue edema throughout the arm. No acute osseous abnormality or radiographic evidence of osteomyelitis. Electronically Signed   By: Meda KlinefelterStephanie  Peacock MD   On: 04/01/2020 08:59   CT Angio Chest PE W and/or Wo Contrast  Result Date: 04/01/2020 CLINICAL DATA:  Pt to ED c/o redness/swelling to RIGHT Forearm. Hx of IVDU, most recently 3 days ago. EXAM: CT ANGIOGRAPHY CHEST WITH CONTRAST TECHNIQUE: Multidetector CT imaging of the chest was performed using the standard protocol during bolus administration of intravenous contrast. Multiplanar CT image reconstructions and MIPs were obtained to evaluate the vascular anatomy. CONTRAST:  80mL OMNIPAQUE IOHEXOL 350 MG/ML SOLN COMPARISON:  None. FINDINGS: Cardiovascular: Satisfactory opacification of the pulmonary arteries to the segmental level. No evidence of pulmonary embolism. Normal heart size. No pericardial effusion. Mediastinum/Nodes: Mildly enlarged right axillary lymph nodes may be reactive. Thyroid gland, trachea, and esophagus demonstrate no significant findings. Lungs/Pleura: Lungs are clear. No pleural effusion or pneumothorax. Upper Abdomen: Enlarged spleen.  Otherwise unremarkable. Musculoskeletal: No chest wall abnormality. No acute or significant osseous findings. Review of the MIP images confirms the above findings. IMPRESSION: 1. No evidence of pulmonary embolism or other acute intrathoracic process. 2. Mildly enlarged right axillary lymph nodes may be reactive. 3. Splenomegaly. Electronically Signed   By: Emmaline KluverNancy  Ballantyne M.D.   On: 04/01/2020 12:20    Procedures .Critical Care Performed by: Alvira MondaySchlossman, Vyla Pint, MD Authorized by: Alvira MondaySchlossman, Tira Lafferty, MD   Critical care provider statement:    Critical care time (minutes):  45   Critical care was time spent personally by me on the following activities:  Discussions with consultants, examination of patient, ordering and  performing treatments and interventions, ordering and review of laboratory studies, ordering and review of radiographic studies, pulse oximetry, obtaining history from patient or surrogate and review of old charts   (including critical care time)  Medications Ordered in ED Medications  vancomycin (VANCOREADY) IVPB 1500 mg/300 mL (0 mg Intravenous Stopped 04/01/20 1105)  piperacillin-tazobactam (ZOSYN) IVPB 3.375 g (has no administration in time range)  acetaminophen (TYLENOL) tablet 650 mg (650 mg Oral Given by Other 04/01/20 0826)  sodium chloride 0.9 % bolus 1,000 mL (0 mLs Intravenous Stopped 04/01/20 0947)  piperacillin-tazobactam (ZOSYN) IVPB 3.375 g (  0 g Intravenous Stopped 04/01/20 0856)  fentaNYL (SUBLIMAZE) injection 100 mcg (100 mcg Intravenous Given 04/01/20 0946)  iohexol (OMNIPAQUE) 350 MG/ML injection 100 mL (80 mLs Intravenous Contrast Given 04/01/20 1204)    ED Course  I have reviewed the triage vital signs and the nursing notes.  Pertinent labs & imaging results that were available during my care of the patient were reviewed by me and considered in my medical decision making (see chart for details).    MDM Rules/Calculators/A&P                          34 year old male with history of IV drug use, MRSA bacteremia, fungemia, major depressive disorder, presents with concern for pain and erythema to right forearm, fever, and cough.  Febrile to 102.8.  Concern for abscess, cellulitis and foreign body/retained needle on exam and need for possible surgical intervention and consulted Dr. Melvyn Novas of hand surgery.   Given hx of cough, CT PE study ordered to eval for PE/?septic emboli and shows no evidence of PE or septic emboli.   Blood cx drawn, no other signs of sepsis on labs.  Given empiric vanc/zosyn.  Will admit for further care.  COVID 19 testing returned positive.     Final Clinical Impression(s) / ED Diagnoses Final diagnoses:  Fever, unspecified fever cause  Abscess of  forearm, right  Cellulitis of right forearm  Foreign body in right forearm, initial encounter  COVID-19    Rx / DC Orders ED Discharge Orders    None       Alvira Monday, MD 04/01/20 1317

## 2020-04-01 NOTE — Anesthesia Procedure Notes (Signed)
Procedure Name: Intubation Date/Time: 04/01/2020 5:05 PM Performed by: Tillman Abide, CRNA Pre-anesthesia Checklist: Patient identified, Emergency Drugs available, Suction available and Patient being monitored Patient Re-evaluated:Patient Re-evaluated prior to induction Oxygen Delivery Method: Circle System Utilized Preoxygenation: Pre-oxygenation with 100% oxygen Induction Type: IV induction, Rapid sequence and Cricoid Pressure applied Laryngoscope Size: Glidescope, 1 and 3 Grade View: Grade I Tube type: Oral Number of attempts: 1 Airway Equipment and Method: Stylet Placement Confirmation: ETT inserted through vocal cords under direct vision,  positive ETCO2 and breath sounds checked- equal and bilateral Secured at: 22 cm Tube secured with: Tape Dental Injury: Teeth and Oropharynx as per pre-operative assessment

## 2020-04-01 NOTE — Op Note (Signed)
PREOPERATIVE DIAGNOSIS: Right antecubital abscess  POSTOPERATIVE DIAGNOSIS: Same  ATTENDING SURGEON: Dr. Bradly Bienenstock who scrubbed and present for the entire procedure  ASSISTANT SURGEON: None  ANESTHESIA: General via endotracheal anesthesia  OPERATIVE PROCEDURE: Right elbow incision and drainage of complicated abscess Right elbow excision of thrombosed vein partial vein excision   IMPLANTS: None  RADIOGRAPHIC INTERPRETATION: None  SURGICAL INDICATIONS: Patient is a 34 year old right-hand-dominant heroin user IV drug user who also was Covid who presented to the emergency department with worsening pain and swelling in the right antecubital region.  Patient seen and evaluated and recommended undergo the above procedure.  Risks of surgery include but not limited to bleeding infection damage nearby nerves arteries or tendons loss of motion of the wrist and digits incomplete relief of symptoms and need for further surgical invention  SURGICAL TECHNIQUE: Patient palpated find the preoperative holding area marked for marker made the right elbow and indicate correct operative site.  Patient brought back operating placed supine on anesthesia table where the general anesthetic tracheal anesthesia was administered.  Patient tolerates well.  Patient was on preoperative antibiotics.  Well-padded tourniquet placed on the right brachium seal with appropriate drape.  Right upper extremity then prepped and draped normal sterile fashion.  A timeout was called the correct site identified procedure then begun.  Attention then turned to the right elbow curvilinear incision made directly over the abscess area.  Dissection carried down through skin subcutaneous tissue the abscess area was then decompressed.  The patient did have a large thrombosed vein with a lot of foreign material and debris within the vein.  Partial excision of the thrombosed vein was then carried out.  The wound was then thoroughly irrigated.   Copious wound irrigation done of the abscess area and excision of the vein region.  After thorough wound irrigation the skin was loosely reapproximated and closed with simple Prolene suture.  Adaptic dressing sterile compressive bandage applied.  The patient tolerated the procedure well returned recovery room in good condition  POSTOPERATIVE PLAN: Patient be admitted to the internal medicine service.  Needs to keep the bandage on the entire time.  I need to see him back in the office next week for wound check.  Please contact me should the patient remain inpatient so that we can combine look at the wound.  Our plan to see her back in another week.  IV antibiotics per the primary service.

## 2020-04-01 NOTE — Progress Notes (Signed)
Pharmacy Antibiotic Note  Jon Castro is a 34 y.o. male admitted on 03/31/2020 with cellulitis.  Pharmacy has been consulted for vancomycin and zosyn dosing. Pt is febrile with Tmax 102.8 and WBC is mildly elevated at 10.6. Scr is WNL and lactic acid is normal. Hx of IVDU.   Plan: Vancomcyin 1500mg  IV Q12H Zosyn 3.375gm IV Q8H (4 hr inf) F/u renal fxn, C&S, clinical status and trough at SS  Height: 6\' 1"  (185.4 cm) Weight: 79.4 kg (175 lb) IBW/kg (Calculated) : 79.9  Temp (24hrs), Avg:99.7 F (37.6 C), Min:98 F (36.7 C), Max:102.8 F (39.3 C)  Recent Labs  Lab 03/31/20 2316  WBC 10.6*  CREATININE 0.98  LATICACIDVEN 1.2    Estimated Creatinine Clearance: 118.2 mL/min (by C-G formula based on SCr of 0.98 mg/dL).    Allergies  Allergen Reactions  . Vicodin [Hydrocodone-Acetaminophen] Itching  . Codeine Nausea And Vomiting  . Codeine   . Tramadol Other (See Comments)    "messes with head"  . Tramadol   . Vicodin [Hydrocodone-Acetaminophen]     Antimicrobials this admission: Vanc 8/4>> Zosyn 8/4>>  Dose adjustments this admission: N/A  Microbiology results: Pending  Thank you for allowing pharmacy to be a part of this patient's care.  Andilyn Bettcher, 04/01/2020 8:12 AM

## 2020-04-01 NOTE — H&P (Addendum)
Date: 04/01/2020               Patient Name:  Jon Castro MRN: [id number]  DOB: February 05, 1986 Age / Sex: 34 y.o., male   PCP: Patient, [name] Per         Medical Service: Internal Medicine Teaching Service         Attending Physician: Dr. Mayford Knife    First Contact: Dr. Claudette Laws Pager: [phone]  Second Contact: Chesley Mires, DO, [place] Pager: Royann Shivers [phone])       After Hours (After 5p/  First Contact Pager: [phone]  weekends / holidays): Second Contact Pager: [phone]   Chief Complaint: Right forearm swelling  History of Present Illness: Jon Castro is a 34 year old person who uses IV drugs (heroin), history of MRSA bacteremia, candidemia, major depressive disorder with psychosis, hepatitis C virus (untreated) who presented to [facility] ED with 3-day history of swelling of the right forearm with associated pain and erythema.  He also endorses subjective fevers, malaise. Of note, he had also reported of a subsequent 3-day history of cough that was productive of yellow sputum however denies shortness of breath, nausea, vomiting, abdominal pain. He does not recall any sick contacts. In regards to IVDU, he has tried suboxone and methadone in the past with no success. The last time he injected IV drugs was 3 days ago  In the emergency department he was found to be febrile with a T-max of 102.8 F, tachycardic to 120s, RR with range 16-26, SPO2 100% on room air.   Lab Orders     Culture, blood (Routine x 2)     SARS Coronavirus 2 by RT PCR (hospital order, performed in [facility] hospital lab) Nasopharyngeal Nasopharyngeal Swab     Comprehensive metabolic panel     Lactic acid, plasma     CBC with Differential     Protime-INR     Urinalysis, Routine w reflex microscopic     Comprehensive metabolic panel     CBC   Meds:  No outpatient medications have been marked as taking for the 03/31/20 encounter [facility] Encounter).     Allergies: Allergies as of 03/31/2020 - Review  Complete 03/31/2020  Allergen Reaction Noted  . Vicodin [hydrocodone-acetaminophen] Itching 04/19/2012  . Codeine Nausea And Vomiting 12/28/2011  . Codeine  05/30/2019  . Tramadol Other (See Comments) 09/25/2011  . Tramadol  05/30/2019  . Vicodin [hydrocodone-acetaminophen]  05/30/2019   Past Medical History:  Diagnosis Date  . Back pain   . Drug abuse (HCC)   . IV drug abuse (HCC)   . IVDU (intravenous drug user)   . LFTs abnormal     Family History: Unsure  Social History: Currently homeless. Have been homeless for 1 year. Lives on the streets. Current IVDU (Heroin). Denies using EtOH, marijuana, Cocaine. Tried Suboxone and methadone in the past with no success.   Review of Systems: A complete ROS was negative except as per HPI.   Physical Exam: Blood pressure 121/87, pulse 82, temperature 99 F (37.2 C), temperature source Oral, resp. rate (!) 26, height 6\' 1"  (1.854 m), weight 79.4 kg, SpO2 98 %. Physical Exam Vitals and nursing note reviewed.  Constitutional:      General: He is not in acute distress.    Appearance: He is not toxic-appearing.  HENT:     Head: Normocephalic and atraumatic.  Eyes:     General:        Right eye: No discharge.  Left eye: No discharge.     Conjunctiva/sclera: Conjunctivae normal.  Cardiovascular:     Rate and Rhythm: Regular rhythm. Tachycardia present.     Pulses: Normal pulses.     Heart sounds: No murmur heard.   Pulmonary:     Effort: Pulmonary effort is normal. No respiratory distress.     Breath sounds: Normal breath sounds. No wheezing or rales.  Abdominal:     General: Abdomen is flat. Bowel sounds are normal. There is no distension.     Palpations: Abdomen is soft.     Tenderness: There is no abdominal tenderness.     Hernia: No hernia is present.  Musculoskeletal:        General: No swelling or tenderness.     Cervical back: Neck supple.  Skin:    General: Skin is warm.     Findings: Erythema present.    Neurological:     Mental Status: He is alert.  Psychiatric:        Mood and Affect: Mood normal.        Behavior: Behavior normal.     Comments: Upset as he has been NPO         EKG: personally reviewed my interpretation is SR, early repolarization in a young otherwise healthy patient  Assessment & Plan by Problem: Principal Problem:   Cellulitis of right forearm Active Problems:   Opioid dependence with opioid-induced mood disorder (HCC)   LFTs abnormal   IVDU (intravenous drug user)   MDD (major depressive disorder), recurrent severe, without psychosis (HCC)   Opioid use disorder, severe, dependence (HCC)  Jon Castro is a 34 year old person who uses IV drugs, history of MRSA bacteremia, candidemia, major depressive disorder with psychosis, hepatitis C virus (untreated) here for management of purulent cellulitis right forearm/antecubital and also found to have tested positive for COVID-19  #Cellulitis of right forearm/antecubital (unclear if there's underlying abscess) Febrile to 102.8 F in the ED, tachycardic to 120s. Right AC/forarm with area of confluence, erythematous. Given history of IVDU, MRSA bacteremia in the past and concern for subsequent bacteremia, will continue empiric broad spectrum antibiotics.  -Continue vancomycin and Zosyn -Follow-up daily CBC -Hand surgery to evaluate patient -Keep NPO -Pain regimen: Dilaudid IV 0.5-1mg  q4hrs   #Positive SARS-CoV-2 Though he does mention of a 3-day history of cough, he denies shortness of breath.  In addition, he is not requiring supplemental oxygen and will thus hold off on treatment for COVID-19 and continue to monitor his symptoms. -If SPO2 <94%, will reconsider starting treatment accordingly -Hold off obtaining inflammatory markers; D-dimer, fibrinogen, ferritin   #IV drug use Last used 3 days ago. Has tried suboxone & methadone in past without success -Start Dilaudid 0.5-1mg  q4hrs -If pain uncontrolled, will  consider PCA   #Hepatitis C virus-untreated Hep C viral load in February 2021 was 9 million -Follow-up ID outpatient for treatment   #Major depressive disorder #Multiple suicidal attempts -Continue duloxetine -Will benefit from following up with [facility] outpatient   FEN:NPO VTE ppx:SQ heparin CODE STATUS:FULL  Prior to Admission Living Arrangement: Homeless Anticipated Discharge Location: Pending Barriers to Discharge: Cellulitis   Dispo: Admit patient to Inpatient with expected length of stay greater than 2 midnights.  Signed: Yvette Rack, MD 04/01/2020, 1:36 PM  Pager: [phone] Internal Medicine Teaching Service After 5pm on weekdays and 1pm on weekends: On Call pager: [phone]

## 2020-04-01 NOTE — Progress Notes (Signed)
PHARMACY - PHYSICIAN COMMUNICATION CRITICAL VALUE ALERT - BLOOD CULTURE IDENTIFICATION (BCID)  Jon Castro is an 34 y.o. male who presented to [facility] on 03/31/2020 with a chief complaint of cellulities  Assessment:  MRSA bacteremia, noted h/o MRSA bacteremia and candidemia (R forearm likely source)  Name of physician (or Provider) Contacted: Dr. Laddie Aquas  Current antibiotics: Vancomycin and Zosyn  Changes to prescribed antibiotics recommended:  Will continue Vancomycin and d/c Zosyn. ID will be auto-consulted  Results for orders placed or performed during the hospital encounter of 03/31/20  Blood Culture ID Panel (Reflexed) (Collected: 03/31/2020 11:18 PM)  Result Value Ref Range   Enterococcus faecalis NOT DETECTED NOT DETECTED   Enterococcus Faecium NOT DETECTED NOT DETECTED   Listeria monocytogenes NOT DETECTED NOT DETECTED   Staphylococcus species DETECTED (A) NOT DETECTED   Staphylococcus aureus (BCID) DETECTED (A) NOT DETECTED   Staphylococcus epidermidis NOT DETECTED NOT DETECTED   Staphylococcus lugdunensis NOT DETECTED NOT DETECTED   Streptococcus species NOT DETECTED NOT DETECTED   Streptococcus agalactiae NOT DETECTED NOT DETECTED   Streptococcus pneumoniae NOT DETECTED NOT DETECTED   Streptococcus pyogenes NOT DETECTED NOT DETECTED   A.calcoaceticus-baumannii NOT DETECTED NOT DETECTED   Bacteroides fragilis NOT DETECTED NOT DETECTED   Enterobacterales NOT DETECTED NOT DETECTED   Enterobacter cloacae complex NOT DETECTED NOT DETECTED   Escherichia coli NOT DETECTED NOT DETECTED   Klebsiella aerogenes NOT DETECTED NOT DETECTED   Klebsiella oxytoca NOT DETECTED NOT DETECTED   Klebsiella pneumoniae NOT DETECTED NOT DETECTED   Proteus species NOT DETECTED NOT DETECTED   Salmonella species NOT DETECTED NOT DETECTED   Serratia marcescens NOT DETECTED NOT DETECTED   Haemophilus influenzae NOT DETECTED NOT DETECTED   Neisseria meningitidis NOT DETECTED NOT  DETECTED   Pseudomonas aeruginosa NOT DETECTED NOT DETECTED   Stenotrophomonas maltophilia NOT DETECTED NOT DETECTED   Candida albicans NOT DETECTED NOT DETECTED   Candida auris NOT DETECTED NOT DETECTED   Candida glabrata NOT DETECTED NOT DETECTED   Candida krusei NOT DETECTED NOT DETECTED   Candida parapsilosis NOT DETECTED NOT DETECTED   Candida tropicalis NOT DETECTED NOT DETECTED   Cryptococcus neoformans/gattii NOT DETECTED NOT DETECTED   Meth resistant mecA/C and MREJ DETECTED (A) NOT DETECTED    Christoper Fabian, PharmD, BCPS Please see amion for complete clinical pharmacist phone list 04/02/2020  12:00 AM

## 2020-04-01 NOTE — ED Notes (Signed)
Pt requesting something to eat and drink stating he has not been given anything for an extensive amount of time. Informed scheduled surgery today NPO required.

## 2020-04-02 ENCOUNTER — Encounter (HOSPITAL_COMMUNITY): Payer: Self-pay | Admitting: Orthopedic Surgery

## 2020-04-02 ENCOUNTER — Inpatient Hospital Stay (HOSPITAL_COMMUNITY): Payer: Self-pay

## 2020-04-02 DIAGNOSIS — B182 Chronic viral hepatitis C: Secondary | ICD-10-CM

## 2020-04-02 DIAGNOSIS — F1110 Opioid abuse, uncomplicated: Secondary | ICD-10-CM

## 2020-04-02 DIAGNOSIS — R7881 Bacteremia: Secondary | ICD-10-CM

## 2020-04-02 DIAGNOSIS — Z590 Homelessness: Secondary | ICD-10-CM

## 2020-04-02 LAB — BLOOD CULTURE ID PANEL (REFLEXED) - BCID2

## 2020-04-02 LAB — CBC
HCT: 36.4 % — ABNORMAL LOW (ref 39.0–52.0)
Hemoglobin: 11.9 g/dL — ABNORMAL LOW (ref 13.0–17.0)
MCH: 26.8 pg (ref 26.0–34.0)
MCHC: 32.7 g/dL (ref 30.0–36.0)
MCV: 82 fL (ref 80.0–100.0)
Platelets: 232 10*3/uL (ref 150–400)
RBC: 4.44 MIL/uL (ref 4.22–5.81)
RDW: 15.1 % (ref 11.5–15.5)
WBC: 8.9 10*3/uL (ref 4.0–10.5)
nRBC: 0 % (ref 0.0–0.2)

## 2020-04-02 LAB — COMPREHENSIVE METABOLIC PANEL
ALT: 62 U/L — ABNORMAL HIGH (ref 0–44)
AST: 44 U/L — ABNORMAL HIGH (ref 15–41)
Albumin: 2.8 g/dL — ABNORMAL LOW (ref 3.5–5.0)
Alkaline Phosphatase: 59 U/L (ref 38–126)
Anion gap: 10 (ref 5–15)
BUN: 12 mg/dL (ref 6–20)
CO2: 23 mmol/L (ref 22–32)
Calcium: 8.6 mg/dL — ABNORMAL LOW (ref 8.9–10.3)
Chloride: 103 mmol/L (ref 98–111)
Creatinine, Ser: 0.88 mg/dL (ref 0.61–1.24)
GFR calc Af Amer: >60 mL/min (ref >60)
GFR calc non Af Amer: >60 mL/min (ref >60)
Glucose, Bld: 133 mg/dL — ABNORMAL HIGH (ref 70–99)
Potassium: 4.2 mmol/L (ref 3.5–5.1)
Sodium: 136 mmol/L (ref 135–145)
Total Bilirubin: 0.5 mg/dL (ref 0.3–1.2)
Total Protein: 7.6 g/dL (ref 6.5–8.1)

## 2020-04-02 LAB — ECHOCARDIOGRAM LIMITED
Height: 73 in
Weight: 2800 oz

## 2020-04-02 MED ORDER — HYDROMORPHONE HCL 1 MG/ML IJ SOLN
0.5000 mg | INTRAMUSCULAR | Status: DC | PRN
  Administered 2020-04-02 – 2020-04-03 (×5): 0.5 mg via INTRAVENOUS
  Filled 2020-04-02 (×5): qty 0.5

## 2020-04-02 MED ORDER — RIVAROXABAN 10 MG PO TABS
10.0000 mg | ORAL_TABLET | Freq: Every day | ORAL | Status: DC
  Administered 2020-04-02 – 2020-04-17 (×16): 10 mg via ORAL
  Filled 2020-04-02 (×16): qty 1

## 2020-04-02 NOTE — Progress Notes (Signed)
PHARMACY - PHYSICIAN COMMUNICATION CRITICAL VALUE ALERT - BLOOD CULTURE IDENTIFICATION (BCID)  Jon Castro is an 34 y.o. male who presented to [facility] on 03/31/2020 with a chief complaint of R arm swelling 2/2 to recent IVDA.   Assessment:   3/4 BCx bottles + MRSA Patient also underwent I&D of R arm last night  Name of physician (or Provider) Contacted: Lenward Chancellor  Current antibiotics:  Vancomycin  Changes to prescribed antibiotics recommended:  Patient is on recommended antibiotics - No changes needed  Results for orders placed or performed during the hospital encounter of 03/31/20  Blood Culture ID Panel (Reflexed) (Collected: 03/31/2020 11:00 PM)  Result Value Ref Range   Enterococcus faecalis NOT DETECTED NOT DETECTED   Enterococcus Faecium NOT DETECTED NOT DETECTED   Listeria monocytogenes NOT DETECTED NOT DETECTED   Staphylococcus species DETECTED (A) NOT DETECTED   Staphylococcus aureus (BCID) DETECTED (A) NOT DETECTED   Staphylococcus epidermidis NOT DETECTED NOT DETECTED   Staphylococcus lugdunensis NOT DETECTED NOT DETECTED   Streptococcus species NOT DETECTED NOT DETECTED   Streptococcus agalactiae NOT DETECTED NOT DETECTED   Streptococcus pneumoniae NOT DETECTED NOT DETECTED   Streptococcus pyogenes NOT DETECTED NOT DETECTED   A.calcoaceticus-baumannii NOT DETECTED NOT DETECTED   Bacteroides fragilis NOT DETECTED NOT DETECTED   Enterobacterales NOT DETECTED NOT DETECTED   Enterobacter cloacae complex NOT DETECTED NOT DETECTED   Escherichia coli NOT DETECTED NOT DETECTED   Klebsiella aerogenes NOT DETECTED NOT DETECTED   Klebsiella oxytoca NOT DETECTED NOT DETECTED   Klebsiella pneumoniae NOT DETECTED NOT DETECTED   Proteus species NOT DETECTED NOT DETECTED   Salmonella species NOT DETECTED NOT DETECTED   Serratia marcescens NOT DETECTED NOT DETECTED   Haemophilus influenzae NOT DETECTED NOT DETECTED   Neisseria meningitidis NOT DETECTED NOT  DETECTED   Pseudomonas aeruginosa NOT DETECTED NOT DETECTED   Stenotrophomonas maltophilia NOT DETECTED NOT DETECTED   Candida albicans NOT DETECTED NOT DETECTED   Candida auris NOT DETECTED NOT DETECTED   Candida glabrata NOT DETECTED NOT DETECTED   Candida krusei NOT DETECTED NOT DETECTED   Candida parapsilosis NOT DETECTED NOT DETECTED   Candida tropicalis NOT DETECTED NOT DETECTED   Cryptococcus neoformans/gattii NOT DETECTED NOT DETECTED   Meth resistant mecA/C and MREJ DETECTED (A) NOT DETECTED   Margarite Gouge, PharmD PGY2 ID Pharmacy Resident [phone]  04/02/2020  8:39 AM

## 2020-04-02 NOTE — Anesthesia Postprocedure Evaluation (Signed)
Anesthesia Post Note  Patient: Jon Castro  Procedure(s) Performed: IRRIGATION AND DEBRIDEMENT OF ELBOW (Right )     Patient location during evaluation: PACU Anesthesia Type: General Level of consciousness: sedated and patient cooperative Pain management: pain level controlled Vital Signs Assessment: post-procedure vital signs reviewed and stable Respiratory status: spontaneous breathing Cardiovascular status: stable Anesthetic complications: no   No complications documented.  Last Vitals:  Vitals:   04/01/20 2342 04/02/20 0400  BP: 120/90   Pulse:    Resp: 18 16  Temp: 37 C 36.8 C  SpO2:      Last Pain:  Vitals:   04/02/20 0400  TempSrc: Oral  PainSc:                  Lewie Loron

## 2020-04-02 NOTE — H&P (View-Only) (Signed)
Date of Admission:  03/31/2020          Reason for Consult: MRSA bacteremia and AC abscess in patient who injects drugs and also has COVID     Referring Provider: [facility] auto consult and Dr. Mayford Knife  Assessment:  1. MRSA bacteremia due to IV drug use and 2. AC abscess (which is also growing GAS)  w foreign material status post I&D by Dr. Orlan Leavens 3. COVID 19 infection 4. Chronic hepatitis C genotype 3 without hepatic coma 5. Homelessness  Plan:  1. Continue vancomycin for now but would also consider LA options such as ORITAVANCIN or or po zyvox to complete therapy (pt states he wishes to receive full treatment in the hospital) 2. Repeat blood cultures tomorrow 3. 2D echocardiogram has been done as does not show vegetations I am not sure if cardiology is doing transesophageal echocardiograms on patients with Covid 4. Continue to monitor for progression of his COVID (currently not being treated)  Principal Problem:   Cellulitis of right forearm Active Problems:   Opioid dependence with opioid-induced mood disorder (HCC)   LFTs abnormal   IVDU (intravenous drug user)   MDD (major depressive disorder), recurrent severe, without psychosis (HCC)   Opioid use disorder, severe, dependence (HCC)   Scheduled Meds: . DULoxetine  30 mg Oral Daily  . rivaroxaban  10 mg Oral Daily   Continuous Infusions: . vancomycin 1,500 mg (04/02/20 0958)   PRN Meds:.HYDROmorphone (DILAUDID) injection, polyethylene glycol  HPI: Jon Castro is a 34 y.o. male with a past medical history significant for ongoing IV drug abuse with heroin addiction, chronic hepatitis C without hepatic coma who had been seen in the past by our group when he was admitted with MRSA bacteremia and candidemia.  Fortunately at that time he did not have evidence of endocarditis by transesophageal echocardiogram.  In the interim his continue to struggle with his drug addiction depression and homelessness.  He was  admitted to the internal medicine teaching service last night coming in with fevers and right forearm swelling worried injected drugs a few days prior.  He was febrile to nearly 103 tachycardic and tachypneic.  His saturations were normal on room air at 100%.  COVID-19 PCR test was positive.  He had blood cultures drawn and was started on antibiotics the form of vancomycin and Zosyn.  Plain films of his antecubital fossa area showed foreign bodies present.  He was taken to the operating room by Dr. Melvyn Novas last night he performed I&D of his antecubital fossa abscess.  There are multiple thrombosed veins and foreign bodies present in the abscess cavity.  Blood cultures of subsequently turned positive for methicillin-resistant Staph aureus.  Abscess culture also growing group A streptococcus.  2D echocardiogram does not show evidence of vegetations.  He does not have other complaints other than the pain at his [facility] fossa abscess site.  His chest x-ray did not show infiltrates and a CT of the chest which does not show evidence of a pulmonary embolism does not show infiltrates and just shows some hilar lymphadenopathy.  Because he is not hypoxic he is currently not being treated for his COVID-19 infection.  He was not vaccinated against COVID-19.  He does not know of specific contacts for this infection.  In talking to the patient he tells me that he would like to stay in the hospital to complete his IV antibiotic therapy.  We can discuss further.  Currently I do not know if  cardiology is performing transesophageal echocardiogram some patients who are COVID-19 positive.    If we cannot disprove endocarditis I would err on the side of treating him with the duration of antibiotics for endocarditis with 6 weeks of therapy though I would not want to keep him in the hospital for the duration of that therapy.  Hopefully we can clear his bacteremia and he will continue to improve.      Review  of Systems: Review of Systems  Constitutional: Positive for chills, fever and malaise/fatigue. Negative for weight loss.  HENT: Negative for congestion and sore throat.   Eyes: Negative for blurred vision and photophobia.  Respiratory: Negative for cough, shortness of breath and wheezing.   Cardiovascular: Negative for chest pain, palpitations and leg swelling.  Gastrointestinal: Negative for abdominal pain, blood in stool, constipation, diarrhea, heartburn, melena, nausea and vomiting.  Genitourinary: Negative for dysuria, flank pain and hematuria.  Musculoskeletal: Positive for myalgias. Negative for back pain, falls and joint pain.  Skin: Negative for itching and rash.  Neurological: Negative for dizziness, focal weakness, loss of consciousness, weakness and headaches.  Endo/Heme/Allergies: Does not bruise/bleed easily.  Psychiatric/Behavioral: Positive for substance abuse. Negative for depression and suicidal ideas. The patient does not have insomnia.     Past Medical History:  Diagnosis Date  . Back pain   . Drug abuse (HCC)   . IV drug abuse (HCC)   . IVDU (intravenous drug user)   . LFTs abnormal     Social History   Tobacco Use  . Smoking status: Current Every Day Smoker  . Smokeless tobacco: Current User  Vaping Use  . Vaping Use: Never used  Substance Use Topics  . Alcohol use: Yes  . Drug use: Yes    Types: IV, Cocaine, Amphetamines, Marijuana, Heroin, Methamphetamines    Comment: HEROIN    History reviewed. No pertinent family history. Allergies  Allergen Reactions  . Vicodin [Hydrocodone-Acetaminophen] Itching  . Codeine Nausea And Vomiting  . Codeine   . Tramadol Other (See Comments)    "messes with head"  . Tramadol   . Vicodin [Hydrocodone-Acetaminophen]     OBJECTIVE: Blood pressure 118/72, pulse 75, temperature 98.2 F (36.8 C), temperature source Oral, resp. rate 16, height 6' 1" (1.854 m), weight 79.4 kg, SpO2 98 %.  Physical Exam Vitals  reviewed.  Constitutional:      Appearance: He is well-developed.  HENT:     Head: Normocephalic and atraumatic.  Eyes:     Extraocular Movements: Extraocular movements intact.     Conjunctiva/sclera: Conjunctivae normal.     Pupils: Pupils are equal, round, and reactive to light.  Cardiovascular:     Rate and Rhythm: Normal rate and regular rhythm.  Pulmonary:     Effort: Pulmonary effort is normal. No respiratory distress.     Breath sounds: No wheezing.  Abdominal:     General: There is no distension.     Palpations: Abdomen is soft.  Musculoskeletal:        General: Normal range of motion.     Cervical back: Normal range of motion and neck supple.  Skin:    General: Skin is warm and dry.     Coloration: Skin is not pale.     Findings: No erythema or rash.  Neurological:     General: No focal deficit present.     Mental Status: He is alert and oriented to person, place, and time.   Right antecubital fossa is wrapped  No   splinter hemorrhages or Janeway lesions  Multiple tattoos  Lab Results Lab Results  Component Value Date   WBC 8.9 04/02/2020   HGB 11.9 (L) 04/02/2020   HCT 36.4 (L) 04/02/2020   MCV 82.0 04/02/2020   PLT 232 04/02/2020    Lab Results  Component Value Date   CREATININE 0.88 04/02/2020   BUN 12 04/02/2020   NA 136 04/02/2020   K 4.2 04/02/2020   CL 103 04/02/2020   CO2 23 04/02/2020    Lab Results  Component Value Date   ALT 62 (H) 04/02/2020   AST 44 (H) 04/02/2020   ALKPHOS 59 04/02/2020   BILITOT 0.5 04/02/2020     Microbiology: Recent Results (from the past 240 hour(s))  Culture, blood (Routine x 2)     Status: Abnormal (Preliminary result)   Collection Time: 03/31/20 11:00 PM   Specimen: BLOOD LEFT ARM  Result Value Ref Range Status   Specimen Description BLOOD LEFT ARM  Final   Special Requests   Final    BOTTLES DRAWN AEROBIC AND ANAEROBIC Blood Culture results may not be optimal due to an excessive volume of blood received  in culture bottles   Culture  Setup Time   Final    IN BOTH AEROBIC AND ANAEROBIC BOTTLES GRAM POSITIVE COCCI IN CLUSTERS CRITICAL VALUE NOTED.  VALUE IS CONSISTENT WITH PREVIOUSLY REPORTED AND CALLED VALUE. Organism ID to follow    Culture (A)  Final    STAPHYLOCOCCUS AUREUS SUSCEPTIBILITIES TO FOLLOW Performed at [facility], [address], [place], [place] [address]    Report Status PENDING  Incomplete  Blood Culture ID Panel (Reflexed)     Status: Abnormal   Collection Time: 03/31/20 11:00 PM  Result Value Ref Range Status   Enterococcus faecalis NOT DETECTED NOT DETECTED Final   Enterococcus Faecium NOT DETECTED NOT DETECTED Final   Listeria monocytogenes NOT DETECTED NOT DETECTED Final   Staphylococcus species DETECTED (A) NOT DETECTED Final    Comment: CRITICAL RESULT CALLED TO, READ BACK BY AND VERIFIED WITH: A. [facility] PharmD 8:35 04/02/20 ([email or web address])    Staphylococcus aureus (BCID) DETECTED (A) NOT DETECTED Final    Comment: Methicillin (oxacillin)-resistant Staphylococcus aureus (MRSA). MRSA is predictably resistant to beta-lactam antibiotics (except ceftaroline). Preferred therapy is vancomycin unless clinically contraindicated. Patient requires contact precautions if  hospitalized. CRITICAL RESULT CALLED TO, READ BACK BY AND VERIFIED WITH: A. [facility] PharmD 8:35 04/02/20 ([email or web address])    Staphylococcus epidermidis NOT DETECTED NOT DETECTED Final   Staphylococcus lugdunensis NOT DETECTED NOT DETECTED Final   Streptococcus species NOT DETECTED NOT DETECTED Final   Streptococcus agalactiae NOT DETECTED NOT DETECTED Final   Streptococcus pneumoniae NOT DETECTED NOT DETECTED Final   Streptococcus pyogenes NOT DETECTED NOT DETECTED Final   A.calcoaceticus-baumannii NOT DETECTED NOT DETECTED Final   Bacteroides fragilis NOT DETECTED NOT DETECTED Final   Enterobacterales NOT DETECTED NOT DETECTED Final   Enterobacter cloacae complex NOT DETECTED NOT DETECTED Final   Escherichia  coli NOT DETECTED NOT DETECTED Final   Klebsiella aerogenes NOT DETECTED NOT DETECTED Final   Klebsiella oxytoca NOT DETECTED NOT DETECTED Final   Klebsiella pneumoniae NOT DETECTED NOT DETECTED Final   Proteus species NOT DETECTED NOT DETECTED Final   Salmonella species NOT DETECTED NOT DETECTED Final   Serratia marcescens NOT DETECTED NOT DETECTED Final   Haemophilus influenzae NOT DETECTED NOT DETECTED Final   Neisseria meningitidis NOT DETECTED NOT DETECTED Final   Pseudomonas aeruginosa NOT DETECTED NOT DETECTED Final  Stenotrophomonas maltophilia NOT DETECTED NOT DETECTED Final   Candida albicans NOT DETECTED NOT DETECTED Final   Candida auris NOT DETECTED NOT DETECTED Final   Candida glabrata NOT DETECTED NOT DETECTED Final   Candida krusei NOT DETECTED NOT DETECTED Final   Candida parapsilosis NOT DETECTED NOT DETECTED Final   Candida tropicalis NOT DETECTED NOT DETECTED Final   Cryptococcus neoformans/gattii NOT DETECTED NOT DETECTED Final   Meth resistant mecA/C and MREJ DETECTED (A) NOT DETECTED Final    Comment: CRITICAL RESULT CALLED TO, READ BACK BY AND VERIFIED WITH: A. [facility] PharmD 8:35 04/02/20 ([email or web address]) Performed at [facility], [address] [place], [place] [address]   Culture, blood (Routine x 2)     Status: Abnormal (Preliminary result)   Collection Time: 03/31/20 11:18 PM   Specimen: BLOOD LEFT ARM  Result Value Ref Range Status   Specimen Description BLOOD LEFT ARM  Final   Special Requests   Final    BOTTLES DRAWN AEROBIC ONLY Blood Culture adequate volume   Culture  Setup Time   Final    AEROBIC BOTTLE ONLY GRAM POSITIVE COCCI Organism ID to follow CRITICAL RESULT CALLED TO, READ BACK BY AND VERIFIED WITH: A. [facility] PharmD 8:35 04/02/20 ([email or web address]) Performed at [facility], [address], [place], [place] [address]    Culture STAPHYLOCOCCUS AUREUS (A)  Final   Report Status PENDING  Incomplete  Blood Culture ID Panel (Reflexed)     Status:  Abnormal   Collection Time: 03/31/20 11:18 PM  Result Value Ref Range Status   Enterococcus faecalis NOT DETECTED NOT DETECTED Final   Enterococcus Faecium NOT DETECTED NOT DETECTED Final   Listeria monocytogenes NOT DETECTED NOT DETECTED Final   Staphylococcus species DETECTED (A) NOT DETECTED Final    Comment: CRITICAL RESULT CALLED TO, READ BACK BY AND VERIFIED WITH: K AMEND PHARMD 04/01/20 2347 JDW    Staphylococcus aureus (BCID) DETECTED (A) NOT DETECTED Final    Comment: Methicillin (oxacillin)-resistant Staphylococcus aureus (MRSA). MRSA is predictably resistant to beta-lactam antibiotics (except ceftaroline). Preferred therapy is vancomycin unless clinically contraindicated. Patient requires contact precautions if  hospitalized. CRITICAL RESULT CALLED TO, READ BACK BY AND VERIFIED WITH: K AMEND PHARMD 04/01/20 2347 JDW    Staphylococcus epidermidis NOT DETECTED NOT DETECTED Final   Staphylococcus lugdunensis NOT DETECTED NOT DETECTED Final   Streptococcus species NOT DETECTED NOT DETECTED Final   Streptococcus agalactiae NOT DETECTED NOT DETECTED Final   Streptococcus pneumoniae NOT DETECTED NOT DETECTED Final   Streptococcus pyogenes NOT DETECTED NOT DETECTED Final   A.calcoaceticus-baumannii NOT DETECTED NOT DETECTED Final   Bacteroides fragilis NOT DETECTED NOT DETECTED Final   Enterobacterales NOT DETECTED NOT DETECTED Final   Enterobacter cloacae complex NOT DETECTED NOT DETECTED Final   Escherichia coli NOT DETECTED NOT DETECTED Final   Klebsiella aerogenes NOT DETECTED NOT DETECTED Final   Klebsiella oxytoca NOT DETECTED NOT DETECTED Final   Klebsiella pneumoniae NOT DETECTED NOT DETECTED Final   Proteus species NOT DETECTED NOT DETECTED Final   Salmonella species NOT DETECTED NOT DETECTED Final   Serratia marcescens NOT DETECTED NOT DETECTED Final   Haemophilus influenzae NOT DETECTED NOT DETECTED Final   Neisseria meningitidis NOT DETECTED NOT DETECTED Final    Pseudomonas aeruginosa NOT DETECTED NOT DETECTED Final   Stenotrophomonas maltophilia NOT DETECTED NOT DETECTED Final   Candida albicans NOT DETECTED NOT DETECTED Final   Candida auris NOT DETECTED NOT DETECTED Final   Candida glabrata NOT DETECTED NOT DETECTED  Final   Candida krusei NOT DETECTED NOT DETECTED Final   Candida parapsilosis NOT DETECTED NOT DETECTED Final   Candida tropicalis NOT DETECTED NOT DETECTED Final   Cryptococcus neoformans/gattii NOT DETECTED NOT DETECTED Final   Meth resistant mecA/C and MREJ DETECTED (A) NOT DETECTED Final    Comment: CRITICAL RESULT CALLED TO, READ BACK BY AND VERIFIED WITH: K AMEND [facility] 04/01/20 2347 JDW Performed at [facility], [address] [place], [place] [address]   SARS Coronavirus 2 by RT PCR (hospital order, performed in [facility] hospital lab) Nasopharyngeal Nasopharyngeal Swab     Status: Abnormal   Collection Time: 04/01/20  8:17 AM   Specimen: Nasopharyngeal Swab  Result Value Ref Range Status   SARS Coronavirus 2 POSITIVE (A) NEGATIVE Final    Comment: RESULT CALLED TO, READ BACK BY AND VERIFIED WITH: Cherlyn Labella RN 11:00 04/01/20 (wilsonm) (NOTE) SARS-CoV-2 target nucleic acids are DETECTED  SARS-CoV-2 RNA is generally detectable in upper respiratory specimens  during the acute phase of infection.  Positive results are indicative  of the presence of the identified virus, but do not rule out bacterial infection or co-infection with other pathogens not detected by the test.  Clinical correlation with patient history and  other diagnostic information is necessary to determine patient infection status.  The expected result is negative.  Fact Sheet for Patients:   BoilerBrush.com.cy   Fact Sheet for Healthcare Providers:   https://pope.com/    This test is not yet approved or cleared by the [place] FDA and  has been authorized for detection and/or diagnosis of  SARS-CoV-2 by FDA under an Emergency Use Authorization (EUA).  This EUA will remain in effect (meaning this  test can be used) for the duration of  the COVID-19 declaration under Section 564(b)(1) of the Act, 21 U.S.C. section 360-bbb-3(b)(1), unless the authorization is terminated or revoked sooner.  Performed at [facility], [address], [place], [place] [address]   Aerobic/Anaerobic Culture (surgical/deep wound)     Status: None (Preliminary result)   Collection Time: 04/01/20  5:19 PM   Specimen: PATH Other; Tissue  Result Value Ref Range Status   Specimen Description ABSCESS RIGHT ARM  Final   Special Requests NONE  Final   Gram Stain   Final    RARE WBC PRESENT, PREDOMINANTLY PMN FEW GRAM POSITIVE COCCI Performed at [facility], [address] [place], [place] [address]    Culture RARE GROUP A STREP (S.PYOGENES) ISOLATED  Final   Report Status PENDING  Incomplete    Acey Lav, MD [facility] [phone] pager  04/02/2020, 2:05 PM

## 2020-04-02 NOTE — Consult Note (Signed)
Date of Admission:  03/31/2020          Reason for Consult: MRSA bacteremia and AC abscess in patient who injects drugs and also has COVID     Referring Provider: CHAMP auto consult and Dr. Mayford Knife  Assessment:  1. MRSA bacteremia due to IV drug use and 2. AC abscess (which is also growing GAS)  w foreign material status post I&D by Dr. Orlan Leavens 3. COVID 19 infection 4. Chronic hepatitis C genotype 3 without hepatic coma 5. Homelessness  Plan:  1. Continue vancomycin for now but would also consider LA options such as ORITAVANCIN or or po zyvox to complete therapy (pt states he wishes to receive full treatment in the hospital) 2. Repeat blood cultures tomorrow 3. 2D echocardiogram has been done as does not show vegetations I am not sure if cardiology is doing transesophageal echocardiograms on patients with Covid 4. Continue to monitor for progression of his COVID (currently not being treated)  Principal Problem:   Cellulitis of right forearm Active Problems:   Opioid dependence with opioid-induced mood disorder (HCC)   LFTs abnormal   IVDU (intravenous drug user)   MDD (major depressive disorder), recurrent severe, without psychosis (HCC)   Opioid use disorder, severe, dependence (HCC)   Scheduled Meds: . DULoxetine  30 mg Oral Daily  . rivaroxaban  10 mg Oral Daily   Continuous Infusions: . vancomycin 1,500 mg (04/02/20 0958)   PRN Meds:.HYDROmorphone (DILAUDID) injection, polyethylene glycol  HPI: Jon Castro is a 34 y.o. male with a past medical history significant for ongoing IV drug abuse with heroin addiction, chronic hepatitis C without hepatic coma who had been seen in the past by our group when he was admitted with MRSA bacteremia and candidemia.  Fortunately at that time he did not have evidence of endocarditis by transesophageal echocardiogram.  In the interim his continue to struggle with his drug addiction depression and homelessness.  He was  admitted to the internal medicine teaching service last night coming in with fevers and right forearm swelling worried injected drugs a few days prior.  He was febrile to nearly 103 tachycardic and tachypneic.  His saturations were normal on room air at 100%.  COVID-19 PCR test was positive.  He had blood cultures drawn and was started on antibiotics the form of vancomycin and Zosyn.  Plain films of his antecubital fossa area showed foreign bodies present.  He was taken to the operating room by Dr. Melvyn Novas last night he performed I&D of his antecubital fossa abscess.  There are multiple thrombosed veins and foreign bodies present in the abscess cavity.  Blood cultures of subsequently turned positive for methicillin-resistant Staph aureus.  Abscess culture also growing group A streptococcus.  2D echocardiogram does not show evidence of vegetations.  He does not have other complaints other than the pain at his [facility] fossa abscess site.  His chest x-ray did not show infiltrates and a CT of the chest which does not show evidence of a pulmonary embolism does not show infiltrates and just shows some hilar lymphadenopathy.  Because he is not hypoxic he is currently not being treated for his COVID-19 infection.  He was not vaccinated against COVID-19.  He does not know of specific contacts for this infection.  In talking to the patient he tells me that he would like to stay in the hospital to complete his IV antibiotic therapy.  We can discuss further.  Currently I do not know if  cardiology is performing transesophageal echocardiogram some patients who are COVID-19 positive.    If we cannot disprove endocarditis I would err on the side of treating him with the duration of antibiotics for endocarditis with 6 weeks of therapy though I would not want to keep him in the hospital for the duration of that therapy.  Hopefully we can clear his bacteremia and he will continue to improve.      Review  of Systems: Review of Systems  Constitutional: Positive for chills, fever and malaise/fatigue. Negative for weight loss.  HENT: Negative for congestion and sore throat.   Eyes: Negative for blurred vision and photophobia.  Respiratory: Negative for cough, shortness of breath and wheezing.   Cardiovascular: Negative for chest pain, palpitations and leg swelling.  Gastrointestinal: Negative for abdominal pain, blood in stool, constipation, diarrhea, heartburn, melena, nausea and vomiting.  Genitourinary: Negative for dysuria, flank pain and hematuria.  Musculoskeletal: Positive for myalgias. Negative for back pain, falls and joint pain.  Skin: Negative for itching and rash.  Neurological: Negative for dizziness, focal weakness, loss of consciousness, weakness and headaches.  Endo/Heme/Allergies: Does not bruise/bleed easily.  Psychiatric/Behavioral: Positive for substance abuse. Negative for depression and suicidal ideas. The patient does not have insomnia.     Past Medical History:  Diagnosis Date  . Back pain   . Drug abuse (HCC)   . IV drug abuse (HCC)   . IVDU (intravenous drug user)   . LFTs abnormal     Social History   Tobacco Use  . Smoking status: Current Every Day Smoker  . Smokeless tobacco: Current User  Vaping Use  . Vaping Use: Never used  Substance Use Topics  . Alcohol use: Yes  . Drug use: Yes    Types: IV, Cocaine, Amphetamines, Marijuana, Heroin, Methamphetamines    Comment: HEROIN    History reviewed. No pertinent family history. Allergies  Allergen Reactions  . Vicodin [Hydrocodone-Acetaminophen] Itching  . Codeine Nausea And Vomiting  . Codeine   . Tramadol Other (See Comments)    "messes with head"  . Tramadol   . Vicodin [Hydrocodone-Acetaminophen]     OBJECTIVE: Blood pressure 118/72, pulse 75, temperature 98.2 F (36.8 C), temperature source Oral, resp. rate 16, height  (1.854 m), weight 79.4 kg, SpO2 98 %.  Physical Exam Vitals  reviewed.  Constitutional:      Appearance: He is well-developed.  HENT:     Head: Normocephalic and atraumatic.  Eyes:     Extraocular Movements: Extraocular movements intact.     Conjunctiva/sclera: Conjunctivae normal.     Pupils: Pupils are equal, round, and reactive to light.  Cardiovascular:     Rate and Rhythm: Normal rate and regular rhythm.  Pulmonary:     Effort: Pulmonary effort is normal. No respiratory distress.     Breath sounds: No wheezing.  Abdominal:     General: There is no distension.     Palpations: Abdomen is soft.  Musculoskeletal:        General: Normal range of motion.     Cervical back: Normal range of motion and neck supple.  Skin:    General: Skin is warm and dry.     Coloration: Skin is not pale.     Findings: No erythema or rash.  Neurological:     General: No focal deficit present.     Mental Status: He is alert and oriented to person, place, and time.   Right antecubital fossa is wrapped  No  splinter hemorrhages or Janeway lesions  Multiple tattoos  Lab Results Lab Results  Component Value Date   WBC 8.9 04/02/2020   HGB 11.9 (L) 04/02/2020   HCT 36.4 (L) 04/02/2020   MCV 82.0 04/02/2020   PLT 232 04/02/2020    Lab Results  Component Value Date   CREATININE 0.88 04/02/2020   BUN 12 04/02/2020   NA 136 04/02/2020   K 4.2 04/02/2020   CL 103 04/02/2020   CO2 23 04/02/2020    Lab Results  Component Value Date   ALT 62 (H) 04/02/2020   AST 44 (H) 04/02/2020   ALKPHOS 59 04/02/2020   BILITOT 0.5 04/02/2020     Microbiology: Recent Results (from the past 240 hour(s))  Culture, blood (Routine x 2)     Status: Abnormal (Preliminary result)   Collection Time: 03/31/20 11:00 PM   Specimen: BLOOD LEFT ARM  Result Value Ref Range Status   Specimen Description BLOOD LEFT ARM  Final   Special Requests   Final    BOTTLES DRAWN AEROBIC AND ANAEROBIC Blood Culture results may not be optimal due to an excessive volume of blood received  in culture bottles   Culture  Setup Time   Final    IN BOTH AEROBIC AND ANAEROBIC BOTTLES GRAM POSITIVE COCCI IN CLUSTERS CRITICAL VALUE NOTED.  VALUE IS CONSISTENT WITH PREVIOUSLY REPORTED AND CALLED VALUE. Organism ID to follow    Culture (A)  Final    STAPHYLOCOCCUS AUREUS SUSCEPTIBILITIES TO FOLLOW Performed at [facility], [address], [place], [place] [address]    Report Status PENDING  Incomplete  Blood Culture ID Panel (Reflexed)     Status: Abnormal   Collection Time: 03/31/20 11:00 PM  Result Value Ref Range Status   Enterococcus faecalis NOT DETECTED NOT DETECTED Final   Enterococcus Faecium NOT DETECTED NOT DETECTED Final   Listeria monocytogenes NOT DETECTED NOT DETECTED Final   Staphylococcus species DETECTED (A) NOT DETECTED Final    Comment: CRITICAL RESULT CALLED TO, READ BACK BY AND VERIFIED WITH: A. [facility] PharmD 8:35 04/02/20 ([email or web address])    Staphylococcus aureus (BCID) DETECTED (A) NOT DETECTED Final    Comment: Methicillin (oxacillin)-resistant Staphylococcus aureus (MRSA). MRSA is predictably resistant to beta-lactam antibiotics (except ceftaroline). Preferred therapy is vancomycin unless clinically contraindicated. Patient requires contact precautions if  hospitalized. CRITICAL RESULT CALLED TO, READ BACK BY AND VERIFIED WITH: A. [facility] PharmD 8:35 04/02/20 ([email or web address])    Staphylococcus epidermidis NOT DETECTED NOT DETECTED Final   Staphylococcus lugdunensis NOT DETECTED NOT DETECTED Final   Streptococcus species NOT DETECTED NOT DETECTED Final   Streptococcus agalactiae NOT DETECTED NOT DETECTED Final   Streptococcus pneumoniae NOT DETECTED NOT DETECTED Final   Streptococcus pyogenes NOT DETECTED NOT DETECTED Final   A.calcoaceticus-baumannii NOT DETECTED NOT DETECTED Final   Bacteroides fragilis NOT DETECTED NOT DETECTED Final   Enterobacterales NOT DETECTED NOT DETECTED Final   Enterobacter cloacae complex NOT DETECTED NOT DETECTED Final   Escherichia  coli NOT DETECTED NOT DETECTED Final   Klebsiella aerogenes NOT DETECTED NOT DETECTED Final   Klebsiella oxytoca NOT DETECTED NOT DETECTED Final   Klebsiella pneumoniae NOT DETECTED NOT DETECTED Final   Proteus species NOT DETECTED NOT DETECTED Final   Salmonella species NOT DETECTED NOT DETECTED Final   Serratia marcescens NOT DETECTED NOT DETECTED Final   Haemophilus influenzae NOT DETECTED NOT DETECTED Final   Neisseria meningitidis NOT DETECTED NOT DETECTED Final   Pseudomonas aeruginosa NOT DETECTED NOT DETECTED Final  Stenotrophomonas maltophilia NOT DETECTED NOT DETECTED Final   Candida albicans NOT DETECTED NOT DETECTED Final   Candida auris NOT DETECTED NOT DETECTED Final   Candida glabrata NOT DETECTED NOT DETECTED Final   Candida krusei NOT DETECTED NOT DETECTED Final   Candida parapsilosis NOT DETECTED NOT DETECTED Final   Candida tropicalis NOT DETECTED NOT DETECTED Final   Cryptococcus neoformans/gattii NOT DETECTED NOT DETECTED Final   Meth resistant mecA/C and MREJ DETECTED (A) NOT DETECTED Final    Comment: CRITICAL RESULT CALLED TO, READ BACK BY AND VERIFIED WITH: A. [facility] PharmD 8:35 04/02/20 ([email or web address]) Performed at [facility], [address] [place], [place] [address]   Culture, blood (Routine x 2)     Status: Abnormal (Preliminary result)   Collection Time: 03/31/20 11:18 PM   Specimen: BLOOD LEFT ARM  Result Value Ref Range Status   Specimen Description BLOOD LEFT ARM  Final   Special Requests   Final    BOTTLES DRAWN AEROBIC ONLY Blood Culture adequate volume   Culture  Setup Time   Final    AEROBIC BOTTLE ONLY GRAM POSITIVE COCCI Organism ID to follow CRITICAL RESULT CALLED TO, READ BACK BY AND VERIFIED WITH: A. [facility] PharmD 8:35 04/02/20 ([email or web address]) Performed at [facility], [address], [place], [place] [address]    Culture STAPHYLOCOCCUS AUREUS (A)  Final   Report Status PENDING  Incomplete  Blood Culture ID Panel (Reflexed)     Status:  Abnormal   Collection Time: 03/31/20 11:18 PM  Result Value Ref Range Status   Enterococcus faecalis NOT DETECTED NOT DETECTED Final   Enterococcus Faecium NOT DETECTED NOT DETECTED Final   Listeria monocytogenes NOT DETECTED NOT DETECTED Final   Staphylococcus species DETECTED (A) NOT DETECTED Final    Comment: CRITICAL RESULT CALLED TO, READ BACK BY AND VERIFIED WITH: K AMEND PHARMD 04/01/20 2347 JDW    Staphylococcus aureus (BCID) DETECTED (A) NOT DETECTED Final    Comment: Methicillin (oxacillin)-resistant Staphylococcus aureus (MRSA). MRSA is predictably resistant to beta-lactam antibiotics (except ceftaroline). Preferred therapy is vancomycin unless clinically contraindicated. Patient requires contact precautions if  hospitalized. CRITICAL RESULT CALLED TO, READ BACK BY AND VERIFIED WITH: K AMEND PHARMD 04/01/20 2347 JDW    Staphylococcus epidermidis NOT DETECTED NOT DETECTED Final   Staphylococcus lugdunensis NOT DETECTED NOT DETECTED Final   Streptococcus species NOT DETECTED NOT DETECTED Final   Streptococcus agalactiae NOT DETECTED NOT DETECTED Final   Streptococcus pneumoniae NOT DETECTED NOT DETECTED Final   Streptococcus pyogenes NOT DETECTED NOT DETECTED Final   A.calcoaceticus-baumannii NOT DETECTED NOT DETECTED Final   Bacteroides fragilis NOT DETECTED NOT DETECTED Final   Enterobacterales NOT DETECTED NOT DETECTED Final   Enterobacter cloacae complex NOT DETECTED NOT DETECTED Final   Escherichia coli NOT DETECTED NOT DETECTED Final   Klebsiella aerogenes NOT DETECTED NOT DETECTED Final   Klebsiella oxytoca NOT DETECTED NOT DETECTED Final   Klebsiella pneumoniae NOT DETECTED NOT DETECTED Final   Proteus species NOT DETECTED NOT DETECTED Final   Salmonella species NOT DETECTED NOT DETECTED Final   Serratia marcescens NOT DETECTED NOT DETECTED Final   Haemophilus influenzae NOT DETECTED NOT DETECTED Final   Neisseria meningitidis NOT DETECTED NOT DETECTED Final    Pseudomonas aeruginosa NOT DETECTED NOT DETECTED Final   Stenotrophomonas maltophilia NOT DETECTED NOT DETECTED Final   Candida albicans NOT DETECTED NOT DETECTED Final   Candida auris NOT DETECTED NOT DETECTED Final   Candida glabrata NOT DETECTED NOT DETECTED  Final   Candida krusei NOT DETECTED NOT DETECTED Final   Candida parapsilosis NOT DETECTED NOT DETECTED Final   Candida tropicalis NOT DETECTED NOT DETECTED Final   Cryptococcus neoformans/gattii NOT DETECTED NOT DETECTED Final   Meth resistant mecA/C and MREJ DETECTED (A) NOT DETECTED Final    Comment: CRITICAL RESULT CALLED TO, READ BACK BY AND VERIFIED WITH: K AMEND [facility] 04/01/20 2347 JDW Performed at [facility], [address] [place], [place] [address]   SARS Coronavirus 2 by RT PCR (hospital order, performed in [facility] hospital lab) Nasopharyngeal Nasopharyngeal Swab     Status: Abnormal   Collection Time: 04/01/20  8:17 AM   Specimen: Nasopharyngeal Swab  Result Value Ref Range Status   SARS Coronavirus 2 POSITIVE (A) NEGATIVE Final    Comment: RESULT CALLED TO, READ BACK BY AND VERIFIED WITH: Cherlyn Labella RN 11:00 04/01/20 (wilsonm) (NOTE) SARS-CoV-2 target nucleic acids are DETECTED  SARS-CoV-2 RNA is generally detectable in upper respiratory specimens  during the acute phase of infection.  Positive results are indicative  of the presence of the identified virus, but do not rule out bacterial infection or co-infection with other pathogens not detected by the test.  Clinical correlation with patient history and  other diagnostic information is necessary to determine patient infection status.  The expected result is negative.  Fact Sheet for Patients:   BoilerBrush.com.cy   Fact Sheet for Healthcare Providers:   https://pope.com/    This test is not yet approved or cleared by the [place] FDA and  has been authorized for detection and/or diagnosis of  SARS-CoV-2 by FDA under an Emergency Use Authorization (EUA).  This EUA will remain in effect (meaning this  test can be used) for the duration of  the COVID-19 declaration under Section 564(b)(1) of the Act, 21 U.S.C. section 360-bbb-3(b)(1), unless the authorization is terminated or revoked sooner.  Performed at [facility], [address], [place], [place] [address]   Aerobic/Anaerobic Culture (surgical/deep wound)     Status: None (Preliminary result)   Collection Time: 04/01/20  5:19 PM   Specimen: PATH Other; Tissue  Result Value Ref Range Status   Specimen Description ABSCESS RIGHT ARM  Final   Special Requests NONE  Final   Gram Stain   Final    RARE WBC PRESENT, PREDOMINANTLY PMN FEW GRAM POSITIVE COCCI Performed at [facility], [address] [place], [place] [address]    Culture RARE GROUP A STREP (S.PYOGENES) ISOLATED  Final   Report Status PENDING  Incomplete    Acey Lav, MD [facility] Infectious [facility] [phone] pager  04/02/2020, 2:05 PM

## 2020-04-02 NOTE — Progress Notes (Signed)
Subjective:   Patient was admitted to our team and taken for I&D by orthopedic surgery yesterday.  Overnight, his blood cultures came back positive for MRSA. Otherwise, no acute events.  Evaluated at bedside this morning. He reports he is feeling "fine." Reports his pain is well controlled. Reports he has experienced withdrawal from heroin in the past, however denies current symptoms of withdrawal, including N/V/diarrhea. Last used 4 days ago. When informed of his diagnosis of MRSA bacteremia, he expresses understanding of the need for an echocardiogram and that he will have to remain in the hospital for his IV abx secondary to his injection drug use, states he recalls this from his previous experience with MRSA bacteremia. States he primarily injects heroin in his R antecubital fossa.  He states his cough is improving. Denies shortness of breath, abdominal pain, lost of taste or smell. He is not aware of having been exposed to anyone with COVID.  Objective:  Vital signs in last 24 hours: Vitals:   04/01/20 1814 04/01/20 2011 04/01/20 2342 04/02/20 0400  BP: 103/64 106/81 120/90   Pulse: 86 85    Resp: 16 20 18 16   Temp:  98.4 F (36.9 C) 98.6 F (37 C) 98.3 F (36.8 C)  TempSrc:  Oral Oral Oral  SpO2: 98% 99%    Weight:      Height:       Physical Exam: Constitutional: Tired- though well-appearing gentleman sitting upright in bed, alert and attentive, cooperative with our exam Cardiovascular: regular rhythm, no murmurs, rubs, or gallops appreciated Pulmonary: clear lung fields anteriorly Skin: bandage at site of I&D clean, dry, and intact. Neuro: Sensation intact to light touch distally. Able to make a full fist  Pulses:   Assessment/Plan:  Principal Problem:   Cellulitis of right forearm Active Problems:   Opioid dependence with opioid-induced mood disorder (HCC)   LFTs abnormal   IVDU (intravenous drug user)   MDD (major depressive disorder), recurrent severe,  without psychosis (HCC)   Opioid use disorder, severe, dependence [facility])  Mr. Jon Castro is a 34 year old person who uses IV drugs (heroin), history of MRSA/Candida glabrata bacteremia in 03/2019 (subsequent 3-week hospital stay), major depressive disorder with psychosis, hepatitis C virus (untreated) admitted with 3-day history of swelling of the right forearm with associated pain and erythema and found to have R antecubital abscess c/b MRSA bacteremia and positive for SARS-CoV-2. This is hospital day #1.  R antecubital abscess c/b MRSA bacteremia S/p I&D by Ortho on 04/01/20. Febrile to 39.3 on admission, however has been afebrile and otherwise hemodynamically stable since. Patient reports pain is well-controlled. Ortho would like bandage in place until wound check. 3 out of 4 blood cultures positive for MRSA. Patient on Vanc and Zosyn originally and narrowed to Vanc alone with ID consult. TTE without evidence of vegetations. Likely needs TEE, which may be a challenge with his positive COVID swab, appreciate ID recs. Postoperative pain well-managed with Dilaudid 0.5-1mg  q4h PRN. Will de-escalate to 0.5mg  q4h now and likely deescalate further tomorrow with close monitoring for opiate withdrawal as below.  - Ortho to follow-up for wound check - ID consulted, appreciate recs - Continue vancomycin (today is day 2) - Pain control with Dilaudid 0.5mg  q4h PRN  Positive SARS-CoV-2 Though he does mention of a 3-day history of cough, he denies shortness of breath. In addition, he is not requiring supplemental oxygen and will thus hold off on treatment for COVID-19 and continue to monitor his symptoms. - If SPO2 <  94%, will reconsider starting treatment accordingly - Hold off obtaining inflammatory markers; D-dimer, fibrinogen, ferritin  Opiate use disorder (PWID) Last used 3 days prior to admission. Has tried suboxone & methadone in past without success. Currently denying withdrawal symptoms.  - Dilaudid 0.5  q4hrs PRN for pain as above - If pain uncontrolled, will consider PCA - COWS   Hepatitis C virus-untreated Hep C viral load in February 2021 was 9 million -Follow-up ID outpatient for treatment  Major depressive disorder Multiple suicidal attempts -Continue duloxetine -Will benefit from following up with [facility] outpatient  FEN: Regular diet VTE ppx: Xarelto 10 mg daily CODE STATUS:FULL  Alphonzo Severance, MD 04/02/2020, 6:50 AM Pager: [phone] After 5pm on weekdays and 1pm on weekends: On Call pager [phone]

## 2020-04-02 NOTE — Progress Notes (Signed)
Echocardiogram 2D Echocardiogram has been performed.  [name] Route 04/02/2020, 12:02 PM 

## 2020-04-03 DIAGNOSIS — U071 COVID-19: Secondary | ICD-10-CM

## 2020-04-03 DIAGNOSIS — S50851A Superficial foreign body of right forearm, initial encounter: Secondary | ICD-10-CM

## 2020-04-03 DIAGNOSIS — R509 Fever, unspecified: Secondary | ICD-10-CM

## 2020-04-03 LAB — CULTURE, BLOOD (ROUTINE X 2): Special Requests: ADEQUATE

## 2020-04-03 LAB — VANCOMYCIN, TROUGH: Vancomycin Tr: 9 ug/mL — ABNORMAL LOW (ref 15–20)

## 2020-04-03 MED ORDER — OXYCODONE HCL 5 MG PO TABS
5.0000 mg | ORAL_TABLET | ORAL | Status: DC | PRN
  Filled 2020-04-03: qty 1

## 2020-04-03 MED ORDER — IBUPROFEN 400 MG PO TABS
800.0000 mg | ORAL_TABLET | Freq: Three times a day (TID) | ORAL | Status: DC
  Administered 2020-04-03 – 2020-04-11 (×23): 800 mg via ORAL
  Filled 2020-04-03 (×26): qty 2

## 2020-04-03 MED ORDER — VANCOMYCIN HCL 1250 MG/250ML IV SOLN
1250.0000 mg | Freq: Three times a day (TID) | INTRAVENOUS | Status: DC
  Administered 2020-04-04 – 2020-04-05 (×2): 1250 mg via INTRAVENOUS
  Filled 2020-04-03 (×5): qty 250

## 2020-04-03 MED ORDER — OXYCODONE HCL 5 MG PO TABS
5.0000 mg | ORAL_TABLET | ORAL | Status: DC | PRN
  Administered 2020-04-03 – 2020-04-06 (×15): 10 mg via ORAL
  Filled 2020-04-03 (×16): qty 2

## 2020-04-03 NOTE — Progress Notes (Signed)
Pharmacy Antibiotic Note  Jon Castro is a 34 y.o. male admitted on 03/31/2020 with wound infection and MRSA bacteremia.  Pharmacy has been consulted for Vanco dosing.  ID: Vanc for wound infxn and MRSA bacteremia  - WBC 10.6>8.9, SCr 0.88, LA 1.2  Vanc 8/4>> Zosyn 8/4>>8/5  8/6: Vanco trough 9: incr 1250mg /8hr (goal 15-20)  8/3 BCx: MRSA 8/4 R arm abscess:  8/4 COVID: positive   Plan: Vanco 1500mg  already given tonight Increase in AM Vanco to 1250mg  IV q8hrs (incr by 750mg /d)   Height: 6\' 1"  (185.4 cm) Weight: 79.4 kg (175 lb) IBW/kg (Calculated) : 79.9  Temp (24hrs), Avg:98.1 F (36.7 C), Min:97.7 F (36.5 C), Max:98.6 F (37 C)  Recent Labs  Lab 03/31/20 2316 04/01/20 0817 04/02/20 0348 04/03/20 2202  WBC 10.6*  --  8.9  --   CREATININE 0.98  --  0.88  --   LATICACIDVEN 1.2 1.1  --   --   VANCOTROUGH  --   --   --  9*    Estimated Creatinine Clearance: 131.6 mL/min (by C-G formula based on SCr of 0.88 mg/dL).    Allergies  Allergen Reactions  . Vicodin [Hydrocodone-Acetaminophen] Itching  . Codeine Nausea And Vomiting  . Codeine   . Tramadol Other (See Comments)    "messes with head"  . Tramadol   . Vicodin [Hydrocodone-Acetaminophen]     [name] S. 05/31/20, PharmD, BCPS Clinical Staff Pharmacist [email or web address]  06/01/20 04/03/2020 10:54 PM

## 2020-04-03 NOTE — Progress Notes (Signed)
Subjective:   Overnight, there were no acute events. He used his Dilaudid 0.5 mg q4h PRN twice overnight.  Evaluated at bedside this morning. He reports that he still has R arm pain at and around the site of the I&D though it has improved since yesterday. Denies any of his usual symptoms of opioid withdrawal such as nausea/vomiting/diarrhea. States his cough is present but better. Denies shortness of breath, rhinorrhea, abdominal pain, or loss of taste or smell.   Objective:  Vital signs in last 24 hours: Vitals:   04/02/20 1617 04/03/20 0459 04/03/20 0838 04/03/20 1437  BP: 126/83 111/77 109/82 115/83  Pulse: 65 67 (!) 56 60  Resp: (!) 22 19 17 16   Temp: 98.9 F (37.2 C) 97.9 F (36.6 C) 97.7 F (36.5 C) 98 F (36.7 C)  TempSrc: Oral Oral Oral Oral  SpO2: 99% 99% 100% 99%  Weight:      Height:       Physical Exam:  Constitutional: Tired- though well-appearing gentleman sitting upright in bed, alert and attentive, cooperative with our exam, affect is flat Cardiovascular: regular rhythm, no murmurs, rubs, or gallops appreciated Pulmonary: clear lung fields anteriorly Skin: bandage at site of I&D clean, dry, and intact. Neuro: Able to make a full fist   Assessment/Plan:  Principal Problem:   Cellulitis of right forearm Active Problems:   Opioid dependence with opioid-induced mood disorder (HCC)   Abscess of forearm, right   LFTs abnormal   IVDU (intravenous drug user)   MDD (major depressive disorder), recurrent severe, without psychosis (HCC)   Opioid use disorder, severe, dependence (HCC)   COVID-19   Fever   Foreign body in right forearm  Jon Castro is a 34 year old person who uses IV drugs(heroin), history of MRSA/Candida glabrata bacteremia in 03/2019 (subsequent 3-week hospital stay), major depressive disorder with psychosis, hepatitis C virus (untreated) admitted with 3-day history of swelling of the right forearm with associated pain and erythema and found to  have R antecubital abscess c/b MRSA bacteremia and positive for SARS-CoV-2. This is hospital day #2.  R antecubital abscess c/b MRSA bacteremia S/p I&D by Ortho on 04/01/20. Febrile on admission, however afebrile and hemodynamically stable since. Pain is improving. Ortho would like bandage in place until wound check. 3/4 BCx positive for MRSA. AC abscess found to be growing GAS as well. TTE without evidence of vegetations. Likely needs TEE to disprove endocarditis, however per cardiology risk of TEE in a COVID-positive patient may outweigh benefit of doing TEE. Either way, he will require 2 weeks of IV vancomycin in the hospital (today is day 3 of vancomycin). ID has provided recs for possible regimen if we cannot disprove endocarditis as below. We will de-escalate his pain regimen from IV Dilaudid to PO IBU and oxy as below with close monitoring for opiate withdrawal as below. - Ortho to follow-up for wound check - ID consulted, appreciate recs - 8/6 repeat BCx pending - Continue IV vancomycin (today is day 3 of 14). If unable to disprove endocarditis with TEE, would discharge with additional 4 weeks of PO Zyvox.  - Pain control with ibuprofen 800 mg q8h for mild to moderate pain; oxy IR 5mg  q4h for severe pain  Positive SARS-CoV-2 Cough is approving. Denies shortness of breath, rhinorrhea, abdominal pain, or loss of taste or smell. In addition, he is not requiring supplemental oxygen and will thus hold off on treatment for COVID-19 and continue to monitor for progression. The patient tested positive on 8/4,  which we will call Day 1. Today is Day 3 of infection. We will plan on retesting on day 7 or 8 and if negative, he can come off isolation. - If SPO2<94%,will reconsider startingtreatment accordingly - Hold off obtaining inflammatory markers;D-dimer, fibrinogen, ferritin - Retest on Day 7 or 8 (8/10 or 8/11)  Opiate use disorder (PWID) Last used 3 days prior to admission, 5 days ago. Has  tried suboxone & methadone in past without success. Currently denying withdrawal symptoms. COWS have been 0. - Oxycodone IR 5mg  q4h for severe pain - If pain uncontrolled, will consider PCA - COWS   Hepatitis C virus-untreated Hep C viral load in February 2021 was 9 million. Per ID, this is Hep C without hepatic coma and they can treat as an outpatient. - Follow-up ID outpatient for treatment  Major depressive disorder Multiple suicidal attempts - Continue duloxetine - Will benefit from following up with [facility] outpatient  FEN: Regular diet VTE ppx: Xarelto 10 mg daily CODE STATUS:FULL  March 2021, MD 04/03/2020, 5:21 PM Pager: [phone] After 5pm on weekdays and 1pm on weekends: On Call pager [phone]

## 2020-04-03 NOTE — Progress Notes (Signed)
Subjective: No new complaints   Antibiotics:  Anti-infectives (From admission, onward)   Start     Dose/Rate Route Frequency Ordered Stop   04/01/20 1500  piperacillin-tazobactam (ZOSYN) IVPB 3.375 g  Status:  Discontinued        3.375 g 12.5 mL/hr over 240 Minutes Intravenous Every 8 hours 04/01/20 0810 04/01/20 2357   04/01/20 0830  vancomycin (VANCOREADY) IVPB 1500 mg/300 mL     Discontinue     1,500 mg 150 mL/hr over 120 Minutes Intravenous Every 12 hours 04/01/20 0810     04/01/20 0815  piperacillin-tazobactam (ZOSYN) IVPB 3.375 g        3.375 g 100 mL/hr over 30 Minutes Intravenous  Once 04/01/20 0810 04/01/20 0856      Medications: Scheduled Meds: . DULoxetine  30 mg Oral Daily  . ibuprofen  800 mg Oral Q8H  . rivaroxaban  10 mg Oral Daily   Continuous Infusions: . vancomycin 1,500 mg (04/03/20 0917)   PRN Meds:.HYDROmorphone (DILAUDID) injection, oxyCODONE, polyethylene glycol    Objective: Weight change:   Intake/Output Summary (Last 24 hours) at 04/03/2020 1646 Last data filed at 04/03/2020 0900 Gross per 24 hour  Intake 240 ml  Output 525 ml  Net -285 ml   Blood pressure 115/83, pulse 60, temperature 98 F (36.7 C), temperature source Oral, resp. rate 16, height 6\' 1"  (1.854 m), weight 79.4 kg, SpO2 99 %. Temp:  [97.7 F (36.5 C)-98 F (36.7 C)] 98 F (36.7 C) (08/06 1437) Pulse Rate:  [56-67] 60 (08/06 1437) Resp:  [16-19] 16 (08/06 1437) BP: (109-115)/(77-83) 115/83 (08/06 1437) SpO2:  [99 %-100 %] 99 % (08/06 1437)  Physical Exam: General: Alert and awake, oriented x3, not in any acute distress. HEENT: anicteric sclera, EOMI CVS regular rate, normal no murmurs gallops or rubs Chest: , no wheezing, no respiratory distress, lungs clear Abdomen: soft non-distended,  Extremities: Right upper extremity bandaged Neuro: nonfocal  CBC:    BMET Recent Labs    03/31/20 2316 04/02/20 0348  NA 134* 136  K 4.1 4.2  CL 100 103  CO2  28 23  GLUCOSE 113* 133*  BUN 6 12  CREATININE 0.98 0.88  CALCIUM 8.4* 8.6*     Liver Panel  Recent Labs    03/31/20 2316 04/02/20 0348  PROT 7.1 7.6  ALBUMIN 2.9* 2.8*  AST 48* 44*  ALT 71* 62*  ALKPHOS 62 59  BILITOT 0.6 0.5       Sedimentation Rate No results for input(s): ESRSEDRATE in the last 72 hours. C-Reactive Protein No results for input(s): CRP in the last 72 hours.  Micro Results: Recent Results (from the past 720 hour(s))  Culture, blood (Routine x 2)     Status: Abnormal   Collection Time: 03/31/20 11:00 PM   Specimen: BLOOD LEFT ARM  Result Value Ref Range Status   Specimen Description BLOOD LEFT ARM  Final   Special Requests   Final    BOTTLES DRAWN AEROBIC AND ANAEROBIC Blood Culture results may not be optimal due to an excessive volume of blood received in culture bottles   Culture  Setup Time   Final    IN BOTH AEROBIC AND ANAEROBIC BOTTLES GRAM POSITIVE COCCI IN CLUSTERS CRITICAL VALUE NOTED.  VALUE IS CONSISTENT WITH PREVIOUSLY REPORTED AND CALLED VALUE. Organism ID to follow Performed at [facility], [address] [place], [place]    Culture METHICILLIN RESISTANT STAPHYLOCOCCUS AUREUS (  A)  Final   Report Status 04/03/2020 FINAL  Final   Organism ID, Bacteria METHICILLIN RESISTANT STAPHYLOCOCCUS AUREUS  Final      Susceptibility   Methicillin resistant staphylococcus aureus - MIC*    CIPROFLOXACIN >=8 RESISTANT Resistant     ERYTHROMYCIN >=8 RESISTANT Resistant     GENTAMICIN <=0.5 SENSITIVE Sensitive     OXACILLIN >=4 RESISTANT Resistant     TETRACYCLINE <=1 SENSITIVE Sensitive     VANCOMYCIN <=0.5 SENSITIVE Sensitive     TRIMETH/SULFA >=320 RESISTANT Resistant     CLINDAMYCIN >=8 RESISTANT Resistant     RIFAMPIN <=0.5 SENSITIVE Sensitive     Inducible Clindamycin NEGATIVE Sensitive     * METHICILLIN RESISTANT STAPHYLOCOCCUS AUREUS  Blood Culture ID Panel (Reflexed)     Status: Abnormal   Collection Time: 03/31/20  11:00 PM  Result Value Ref Range Status   Enterococcus faecalis NOT DETECTED NOT DETECTED Final   Enterococcus Faecium NOT DETECTED NOT DETECTED Final   Listeria monocytogenes NOT DETECTED NOT DETECTED Final   Staphylococcus species DETECTED (A) NOT DETECTED Final    Comment: CRITICAL RESULT CALLED TO, READ BACK BY AND VERIFIED WITH: A. [facility] PharmD 8:35 04/02/20 ([email or web address])    Staphylococcus aureus (BCID) DETECTED (A) NOT DETECTED Final    Comment: Methicillin (oxacillin)-resistant Staphylococcus aureus (MRSA). MRSA is predictably resistant to beta-lactam antibiotics (except ceftaroline). Preferred therapy is vancomycin unless clinically contraindicated. Patient requires contact precautions if  hospitalized. CRITICAL RESULT CALLED TO, READ BACK BY AND VERIFIED WITH: A. [facility] PharmD 8:35 04/02/20 ([email or web address])    Staphylococcus epidermidis NOT DETECTED NOT DETECTED Final   Staphylococcus lugdunensis NOT DETECTED NOT DETECTED Final   Streptococcus species NOT DETECTED NOT DETECTED Final   Streptococcus agalactiae NOT DETECTED NOT DETECTED Final   Streptococcus pneumoniae NOT DETECTED NOT DETECTED Final   Streptococcus pyogenes NOT DETECTED NOT DETECTED Final   A.calcoaceticus-baumannii NOT DETECTED NOT DETECTED Final   Bacteroides fragilis NOT DETECTED NOT DETECTED Final   Enterobacterales NOT DETECTED NOT DETECTED Final   Enterobacter cloacae complex NOT DETECTED NOT DETECTED Final   Escherichia coli NOT DETECTED NOT DETECTED Final   Klebsiella aerogenes NOT DETECTED NOT DETECTED Final   Klebsiella oxytoca NOT DETECTED NOT DETECTED Final   Klebsiella pneumoniae NOT DETECTED NOT DETECTED Final   Proteus species NOT DETECTED NOT DETECTED Final   Salmonella species NOT DETECTED NOT DETECTED Final   Serratia marcescens NOT DETECTED NOT DETECTED Final   Haemophilus influenzae NOT DETECTED NOT DETECTED Final   Neisseria meningitidis NOT DETECTED NOT DETECTED Final   Pseudomonas aeruginosa NOT  DETECTED NOT DETECTED Final   Stenotrophomonas maltophilia NOT DETECTED NOT DETECTED Final   Candida albicans NOT DETECTED NOT DETECTED Final   Candida auris NOT DETECTED NOT DETECTED Final   Candida glabrata NOT DETECTED NOT DETECTED Final   Candida krusei NOT DETECTED NOT DETECTED Final   Candida parapsilosis NOT DETECTED NOT DETECTED Final   Candida tropicalis NOT DETECTED NOT DETECTED Final   Cryptococcus neoformans/gattii NOT DETECTED NOT DETECTED Final   Meth resistant mecA/C and MREJ DETECTED (A) NOT DETECTED Final    Comment: CRITICAL RESULT CALLED TO, READ BACK BY AND VERIFIED WITH: A. [facility] PharmD 8:35 04/02/20 ([email or web address]) Performed at [facility], [address], [place], [place] [address]   Culture, blood (Routine x 2)     Status: Abnormal   Collection Time: 03/31/20 11:18 PM   Specimen: BLOOD LEFT ARM  Result Value Ref Range Status   Specimen Description BLOOD  LEFT ARM  Final   Special Requests   Final    BOTTLES DRAWN AEROBIC ONLY Blood Culture adequate volume   Culture  Setup Time   Final    AEROBIC BOTTLE ONLY GRAM POSITIVE COCCI Organism ID to follow CRITICAL RESULT CALLED TO, READ BACK BY AND VERIFIED WITH: A. [facility] PharmD 8:35 04/02/20 ([email or web address])    Culture (A)  Final    STAPHYLOCOCCUS AUREUS SUSCEPTIBILITIES PERFORMED ON PREVIOUS CULTURE WITHIN THE LAST 5 DAYS. Performed at [facility], [address] [place], [place] [address]    Report Status 04/03/2020 FINAL  Final  Blood Culture ID Panel (Reflexed)     Status: Abnormal   Collection Time: 03/31/20 11:18 PM  Result Value Ref Range Status   Enterococcus faecalis NOT DETECTED NOT DETECTED Final   Enterococcus Faecium NOT DETECTED NOT DETECTED Final   Listeria monocytogenes NOT DETECTED NOT DETECTED Final   Staphylococcus species DETECTED (A) NOT DETECTED Final    Comment: CRITICAL RESULT CALLED TO, READ BACK BY AND VERIFIED WITH: [facility] PHARMD 04/01/20 2347 JDW    Staphylococcus aureus (BCID)  DETECTED (A) NOT DETECTED Final    Comment: Methicillin (oxacillin)-resistant Staphylococcus aureus (MRSA). MRSA is predictably resistant to beta-lactam antibiotics (except ceftaroline). Preferred therapy is vancomycin unless clinically contraindicated. Patient requires contact precautions if  hospitalized. CRITICAL RESULT CALLED TO, READ BACK BY AND VERIFIED WITH: K AMEND PHARMD 04/01/20 2347 JDW    Staphylococcus epidermidis NOT DETECTED NOT DETECTED Final   Staphylococcus lugdunensis NOT DETECTED NOT DETECTED Final   Streptococcus species NOT DETECTED NOT DETECTED Final   Streptococcus agalactiae NOT DETECTED NOT DETECTED Final   Streptococcus pneumoniae NOT DETECTED NOT DETECTED Final   Streptococcus pyogenes NOT DETECTED NOT DETECTED Final   A.calcoaceticus-baumannii NOT DETECTED NOT DETECTED Final   Bacteroides fragilis NOT DETECTED NOT DETECTED Final   Enterobacterales NOT DETECTED NOT DETECTED Final   Enterobacter cloacae complex NOT DETECTED NOT DETECTED Final   Escherichia coli NOT DETECTED NOT DETECTED Final   Klebsiella aerogenes NOT DETECTED NOT DETECTED Final   Klebsiella oxytoca NOT DETECTED NOT DETECTED Final   Klebsiella pneumoniae NOT DETECTED NOT DETECTED Final   Proteus species NOT DETECTED NOT DETECTED Final   Salmonella species NOT DETECTED NOT DETECTED Final   Serratia marcescens NOT DETECTED NOT DETECTED Final   Haemophilus influenzae NOT DETECTED NOT DETECTED Final   Neisseria meningitidis NOT DETECTED NOT DETECTED Final   Pseudomonas aeruginosa NOT DETECTED NOT DETECTED Final   Stenotrophomonas maltophilia NOT DETECTED NOT DETECTED Final   Candida albicans NOT DETECTED NOT DETECTED Final   Candida auris NOT DETECTED NOT DETECTED Final   Candida glabrata NOT DETECTED NOT DETECTED Final   Candida krusei NOT DETECTED NOT DETECTED Final   Candida parapsilosis NOT DETECTED NOT DETECTED Final   Candida tropicalis NOT DETECTED NOT DETECTED Final   Cryptococcus  neoformans/gattii NOT DETECTED NOT DETECTED Final   Meth resistant mecA/C and MREJ DETECTED (A) NOT DETECTED Final    Comment: CRITICAL RESULT CALLED TO, READ BACK BY AND VERIFIED WITH: [facility] 04/01/20 2347 JDW Performed at [facility], [address], [place], [place] [address]   SARS Coronavirus 2 by RT PCR (hospital order, performed in [facility] hospital lab) Nasopharyngeal Nasopharyngeal Swab     Status: Abnormal   Collection Time: 04/01/20  8:17 AM   Specimen: Nasopharyngeal Swab  Result Value Ref Range Status   SARS Coronavirus 2 POSITIVE (A) NEGATIVE Final    Comment:  RESULT CALLED TO, READ BACK BY AND VERIFIED WITH: Cherlyn Labella RN 11:00 04/01/20 ([email or web address]) (NOTE) SARS-CoV-2 target nucleic acids are DETECTED  SARS-CoV-2 RNA is generally detectable in upper respiratory specimens  during the acute phase of infection.  Positive results are indicative  of the presence of the identified virus, but do not rule out bacterial infection or co-infection with other pathogens not detected by the test.  Clinical correlation with patient history and  other diagnostic information is necessary to determine patient infection status.  The expected result is negative.  Fact Sheet for Patients:   BoilerBrush.com.cy   Fact Sheet for Healthcare Providers:   https://pope.com/    This test is not yet approved or cleared by the [place] FDA and  has been authorized for detection and/or diagnosis of SARS-CoV-2 by FDA under an Emergency Use Authorization (EUA).  This EUA will remain in effect (meaning this  test can be used) for the duration of  the COVID-19 declaration under Section 564(b)(1) of the Act, 21 U.S.C. section 360-bbb-3(b)(1), unless the authorization is terminated or revoked sooner.  Performed at [facility], [address], [place], [place] [address]   Aerobic/Anaerobic Culture (surgical/deep wound)     Status:  None (Preliminary result)   Collection Time: 04/01/20  5:19 PM   Specimen: PATH Other; Tissue  Result Value Ref Range Status   Specimen Description ABSCESS RIGHT ARM  Final   Special Requests NONE  Final   Gram Stain   Final    RARE WBC PRESENT, PREDOMINANTLY PMN FEW GRAM POSITIVE COCCI Performed at [facility], [address] [place], [place] [address]    Culture   Final    RARE STAPHYLOCOCCUS AUREUS RARE GROUP A STREP (S.PYOGENES) ISOLATED SUSCEPTIBILITIES TO FOLLOW NO ANAEROBES ISOLATED; CULTURE IN PROGRESS FOR 5 DAYS    Report Status PENDING  Incomplete    Studies/Results: ECHOCARDIOGRAM LIMITED  Result Date: 04/02/2020    ECHOCARDIOGRAM LIMITED REPORT   Patient Name:   Jon Castro Date of Exam: 04/02/2020 Medical Rec #:  [id number]            Height:       73.0 in Accession #:    [id number]           Weight:       175.0 lb Date of Birth:  06/19/1986            BSA:          2.033 m Patient Age:    34 years             BP:           125/74 mmHg Patient Gender: M                    HR:           70 bpm. Exam Location:  Inpatient Procedure: Limited Echo and Color Doppler Indications:    Bacteremia  History:        Patient has prior history of Echocardiogram examinations, most                 recent 05/03/2019. Risk Factors:IVDU.  Sonographer:    Irving Burton Senior RDCS Referring Phys: [id number] JULIE ANNE Mayford Knife  Sonographer Comments: COVID+ at time of study. IMPRESSIONS  1. Left ventricular ejection fraction, by estimation, is 70 to 75%. The left ventricle has hyperdynamic function. The left ventricle has no regional wall motion abnormalities.  2.  Right ventricular systolic function is normal. The right ventricular size is normal.  3. The mitral valve is normal in structure. Trivial mitral valve regurgitation. No evidence of mitral stenosis.  4. The aortic valve is tricuspid. Aortic valve regurgitation is not visualized. No aortic stenosis is present.  5. The inferior vena cava is normal  in size with greater than 50% respiratory variability, suggesting right atrial pressure of 3 mmHg. FINDINGS  Left Ventricle: Left ventricular ejection fraction, by estimation, is 70 to 75%. The left ventricle has hyperdynamic function. The left ventricle has no regional wall motion abnormalities. The left ventricular internal cavity size was normal in size. There is no left ventricular hypertrophy. Right Ventricle: The right ventricular size is normal.Right ventricular systolic function is normal. Left Atrium: Left atrial size was normal in size. Right Atrium: Right atrial size was normal in size. Pericardium: There is no evidence of pericardial effusion. Mitral Valve: The mitral valve is normal in structure. Normal mobility of the mitral valve leaflets. Trivial mitral valve regurgitation. No evidence of mitral valve stenosis. Tricuspid Valve: The tricuspid valve is normal in structure. Tricuspid valve regurgitation is trivial. No evidence of tricuspid stenosis. Aortic Valve: The aortic valve is tricuspid. Aortic valve regurgitation is not visualized. No aortic stenosis is present. Pulmonic Valve: The pulmonic valve was normal in structure. Pulmonic valve regurgitation is not visualized. No evidence of pulmonic stenosis. Aorta: The aortic root is normal in size and structure. Venous: The inferior vena cava is normal in size with greater than 50% respiratory variability, suggesting right atrial pressure of 3 mmHg.  Olga Millers MD Electronically signed by Olga Millers MD Signature Date/Time: 04/02/2020/12:45:22 PM    Final       Assessment/Plan:  INTERVAL HISTORY:   2D echocardiogram did not show evidence of endocarditis   Principal Problem:   Cellulitis of right forearm Active Problems:   Opioid dependence with opioid-induced mood disorder (HCC)   LFTs abnormal   IVDU (intravenous drug user)   MDD (major depressive disorder), recurrent severe, without psychosis (HCC)   Opioid use disorder,  severe, dependence (HCC)    Jon Castro is a 34 y.o. male with with heroin addiction who has past history of MRSA bacteremia and candidemia without endocardial involvement, chronic hepatitis C without hepatic coma who was admitted with arm swelling and antecubital fossa abscess.  Blood cultures were taken on admission and he had an I&D performed by Dr. Melvyn Novas, blood cultures have grown MRSA and abscess cultures have grown group A streptococci to date.  2D echocardiogram does not show evidence of endocarditis  He does have COVID-19 infection but without any pulmonary symptoms and is not being treated for that  #1 MRSA bacteremia; from IVDU and his abscess (though GAS isolated from cutlure there so far  --repeat blood cultures  --Is Cardiology doing TEE on COVID 19 patients?  Patient wants to be in the hospital for treatment of his bacteremia  If so and we do not disprove endocarditis would give him 2 weeks of IV vancomycin in the hospital followed by 4 weeks of zyvox  I would also be comfortable with other options such as giving a DOT of ORITAVCIN followed by Zyvox  #2 Abscess: sp I and D and growing GAS (probably will grow MRSA too)  #3  Hepatitis C without hepatic coma: Would be happy to treat him in the outpatient arena  #4 IV drug abuse: Needs a long-term plan for that.  #5 COVID infection: No pulmonary symptoms  and signs and not being treated for this at present.  Not clear at what stage of infection he is in.  He was never vaccinated.  I have encouraged him to be vaccinated in the future   Dr. Orvan Falconerampbell is available for questions this week and will follow up his blood cultures Dr. Drue SecondSnider will take over the service on Monday.   LOS: 2 days   Acey LavCornelius Van Castro 04/03/2020, 4:46 PM

## 2020-04-04 NOTE — Progress Notes (Signed)
Subjective: No acute events overnight. Jon Castro feels about the same today. Elbow pain is manageable with PRN analgesics. He is taking PO well. No issues with BMs.   Objective:  Vital signs in last 24 hours: Vitals:   04/03/20 0838 04/03/20 1437 04/03/20 2040 04/04/20 0536  BP: 109/82 115/83 104/71 109/82  Pulse: (!) 56 60 70 74  Resp: 17 16 18 20   Temp: 97.7 F (36.5 C) 98 F (36.7 C) 98.6 F (37 C) 97.9 F (36.6 C)  TempSrc: Oral Oral Oral Oral  SpO2: 100% 99% 100% 100%  Weight:      Height:       General: awake, alert, sitting up in bed in NAD. Flat affect CV: RRR; no murmurs, rubs or gallops Pulm: Normal work of breathing; lungs CTAB  Assessment/Plan:  Principal Problem:   Cellulitis of right forearm Active Problems:   Opioid dependence with opioid-induced mood disorder (HCC)   Abscess of forearm, right   LFTs abnormal   IVDU (intravenous drug user)   MDD (major depressive disorder), recurrent severe, without psychosis (HCC)   Opioid use disorder, severe, dependence (HCC)   COVID-19   Fever   Foreign body in right forearm  Jon Castro is a 34 year old person who uses IV drugs(heroin), history of MRSA/Candida glabrata bacteremia in 03/2019 (subsequent 3-week hospital stay), major depressive disorder with psychosis, hepatitis C virus (untreated)admitted with 3-day history of swelling of the right forearm with associated pain and erythemaand found to have R antecubital abscess c/b MRSA bacteremia and positive forSARS-CoV-2. This is hospital day #3.  R antecubital abscess c/b MRSA bacteremia S/p I&D byOrtho on 04/01/20. Febrile on admission, however afebrile and hemodynamically stable since. Pain is improving. Ortho would like bandage in place until wound check. 3/4 BCx positive for MRSA. AC abscess found to be growing GAS as well. TTE without evidence of vegetations. Likely needs TEE to disprove endocarditis, however per cardiology risk of TEE in a  COVID-positive patient may outweigh benefit of doing TEE. Either way, he will require 2 weeks of IV vancomycin in the hospital (today is day 4 of vancomycin). ID has provided recs for possible regimen if we cannot disprove endocarditis as below - Ortho to follow-up for wound check - ID consulted, appreciate recs - 8/6 repeat BCx pending  -Continue IV vancomycin (today is day 4 of 14). If unable to disprove endocarditis with TEE, would discharge with additional 4 weeks of PO Zyvox.  -doing well on de-escalated pain regimen of 800 mg q8h for mild to moderate pain; oxy IR 5mg  q4h for severe pain  Positive SARS-CoV-2 -present on admission -patient is asymptomatic and without oxygen requirement so no treatment is indicated -the patient tested positive on 8/4, which we will call Day 1. Today is Day 4 of infection. We will plan on retesting on day 7 or 8 and if negative, he can come off isolation.  Opiate use disorder (PWID) Last used 3 daysprior to admission; as tried suboxone & methadone in past without success. Currently denying withdrawal symptoms. COWS have been 0. Continue pain control as above.   Hepatitis C virus-untreated Hep C viral load in February 2021 was 9 million. ID will plan to follow-up and treat as an outpatient  Major depressive disorder Multiple suicidal attempts - Continue duloxetine - Will benefit from following up with [facility] outpatient  Dispo: Anticipated discharge pending completion of IV antibiotic therapy for MRSA bacteremia   March 2021, DO 04/04/2020, 7:00 AM Pager: [phone] After  5pm on weekdays and 1pm on weekends: On Call pager [phone]

## 2020-04-05 LAB — BASIC METABOLIC PANEL
Anion gap: 9 (ref 5–15)
BUN: 14 mg/dL (ref 6–20)
CO2: 20 mmol/L — ABNORMAL LOW (ref 22–32)
Calcium: 8.8 mg/dL — ABNORMAL LOW (ref 8.9–10.3)
Chloride: 105 mmol/L (ref 98–111)
Creatinine, Ser: 0.83 mg/dL (ref 0.61–1.24)
GFR calc Af Amer: >60 mL/min (ref >60)
GFR calc non Af Amer: >60 mL/min (ref >60)
Glucose, Bld: 100 mg/dL — ABNORMAL HIGH (ref 70–99)
Potassium: 4.4 mmol/L (ref 3.5–5.1)
Sodium: 134 mmol/L — ABNORMAL LOW (ref 135–145)

## 2020-04-05 MED ORDER — SODIUM CHLORIDE 0.9 % IV SOLN
INTRAVENOUS | Status: DC | PRN
  Administered 2020-04-05: 250 mL via INTRAVENOUS

## 2020-04-05 MED ORDER — VANCOMYCIN HCL 1250 MG/250ML IV SOLN
1250.0000 mg | Freq: Three times a day (TID) | INTRAVENOUS | Status: DC
  Administered 2020-04-05 – 2020-04-13 (×24): 1250 mg via INTRAVENOUS
  Filled 2020-04-05 (×27): qty 250

## 2020-04-05 NOTE — Progress Notes (Signed)
Subjective:   No acute events overnight.  Patient evaluated at bedside this morning. Feeling ok this morning. Doesn't think arm pain has made much improvement since surgery, however reports pain medication is keeping it tolerable. He is not having any symptoms of opioid withdrawal. Reports that he is eating well and having normal BMs. Discussed that we can hopefully transfer him off COVID unit after we re-test him in a few days.  Received message from pharmacist that patient had missed 2 doses of vancomycin yesterday. Pharmacist spoke with RN this AM who did not have details regarding missed doses. Pharmacy will continue with current vanc dose and check a level at steady state.    Objective:  Vital signs in last 24 hours: Vitals:   04/03/20 2040 04/04/20 0536 04/04/20 1339 04/04/20 2038  BP: 104/71 109/82 127/68 112/76  Pulse: 70 74 60 77  Resp: 18 20 19 18   Temp: 98.6 F (37 C) 97.9 F (36.6 C) 98.3 F (36.8 C) 99.2 F (37.3 C)  TempSrc: Oral Oral Oral Oral  SpO2: 100% 100% 100% 100%  Weight:      Height:       Physical Exam:  General: awake, alert, sitting up in bed in NAD. Flat affect CV: RRR; no murmurs, rubs or gallops Pulm: Normal work of breathing; lungs CTAB  Assessment/Plan:  Principal Problem:   Cellulitis of right forearm Active Problems:   Opioid dependence with opioid-induced mood disorder (HCC)   Abscess of forearm, right   LFTs abnormal   IVDU (intravenous drug user)   MDD (major depressive disorder), recurrent severe, without psychosis (HCC)   Opioid use disorder, severe, dependence (HCC)   COVID-19   Fever   Foreign body in right forearm  Jon Castro is a 34 year old person who uses IV drugs(heroin), history of MRSA/Candida glabrata bacteremia in 03/2019 (subsequent 3-week hospital stay), major depressive disorder with psychosis, hepatitis C virus (untreated)admitted with 3-day history of swelling of the right forearm with associated pain and  erythemaand found to have R antecubital abscess c/b MRSA bacteremia and positive forSARS-CoV-2. This is hospital day #4.  R antecubital abscess c/b MRSA bacteremia S/p I&D byOrtho on 04/01/20. Afebrile and hemodynamically stable.Pain is tolerable on current pain regimen.Ortho would like bandage in place until wound check. 3/4BCxpositive for MRSA. TTE without evidence of vegetations.Likely needs TEEto disprove endocarditis,however per cardiology risk of TEE in a COVID-positive patient may outweigh benefit of doing TEE. Either way, he will require 2 weeks of IV vancomycin in the hospital (today is day 5 of vancomycin). Of note, two doses of vanc were missed yesterday for unknown reasons. 8/7 ID has provided recs for possible regimen if we cannot disprove endocarditis as below. 8/6 repeat BCx without growth @ 2 days. - Ortho to follow-up for wound check - ID consulted, appreciate recs - 8/6 repeat BCx no growth @ 2 days -ContinueIVvancomycin (today is day 5of 14). If unable to disprove endocarditis with TEE, would discharge with additional 4 weeks of PO Zyvox. -doing well on de-escalated pain regimen of ibuprofen 800 mg q8h for mild to moderate pain; oxy IR 5mg  q4h for severe pain  Positive SARS-CoV-2 Present on admission. Patient with mild cough however otherwise asymptomatic and without oxygen requirement so no treatment is indicated. Tested positive on 8/4, which we will call Day 1. Today is Day 5 of infection. We will plan on retesting on day 7 or 8 and if negative, he can come off isolation.  Opiate use disorder (PWID) Last  used 3 daysprior to admission; as tried suboxone & methadone in past without success. Currently denying withdrawal symptoms.COWS have been 0. Continue pain control as above.   Hepatitis C virus-untreated Hep C viral load in February 2021 was 9 million. ID will plan to follow-up and treat as an outpatient  Major depressive disorder Multiple suicidal  attempts -Continue duloxetine -Will benefit from following up with [facility] outpatient  Dispo: Anticipated discharge pending completion of IV antibiotic therapy for MRSA bacteremia   Jon Severance, MD 04/05/2020, 6:06 AM Pager: [phone] After 5pm on weekdays and 1pm on weekends: On Call pager [phone]

## 2020-04-06 LAB — AEROBIC/ANAEROBIC CULTURE W GRAM STAIN (SURGICAL/DEEP WOUND)

## 2020-04-06 LAB — BASIC METABOLIC PANEL
Anion gap: 6 (ref 5–15)
BUN: 13 mg/dL (ref 6–20)
CO2: 22 mmol/L (ref 22–32)
Calcium: 8.5 mg/dL — ABNORMAL LOW (ref 8.9–10.3)
Chloride: 109 mmol/L (ref 98–111)
Creatinine, Ser: 0.89 mg/dL (ref 0.61–1.24)
GFR calc Af Amer: >60 mL/min (ref >60)
GFR calc non Af Amer: >60 mL/min (ref >60)
Glucose, Bld: 104 mg/dL — ABNORMAL HIGH (ref 70–99)
Potassium: 4.4 mmol/L (ref 3.5–5.1)
Sodium: 137 mmol/L (ref 135–145)

## 2020-04-06 LAB — VANCOMYCIN, TROUGH: Vancomycin Tr: 16 ug/mL (ref 15–20)

## 2020-04-06 MED ORDER — OXYCODONE HCL 5 MG PO TABS
15.0000 mg | ORAL_TABLET | ORAL | Status: DC | PRN
  Administered 2020-04-06 – 2020-04-07 (×5): 15 mg via ORAL
  Filled 2020-04-06 (×5): qty 3

## 2020-04-06 NOTE — Progress Notes (Signed)
Pharmacy Antibiotic Note  Jon Castro is a 34 y.o. male admitted on 03/31/2020 with wound infection and MRSA bacteremia.  Pharmacy has been consulted for vancomycin dosing.  Today, patient is afebrile and WBC WNL.   8/9 0147 Vancomycin trough (obtained 8.25 hr after previous dose) achieved goal of 15-20  Plan: Continue vancomycin 1250 mg IV every 8 hours Monitor clinical picture, renal function, repeat vancomycin trough if clinically indicated F/U C&S, LOT and discharge plans to complete therapy   Height: 6\' 1"  (185.4 cm) Weight: 79.4 kg (175 lb) IBW/kg (Calculated) : 79.9  Temp (24hrs), Avg:99.2 F (37.3 C), Min:98.2 F (36.8 C), Max:99.9 F (37.7 C)  Recent Labs  Lab 03/31/20 2316 04/01/20 0817 04/02/20 0348 04/03/20 2202 04/05/20 0420 04/06/20 0147  WBC 10.6*  --  8.9  --   --   --   CREATININE 0.98  --  0.88  --  0.83 0.89  LATICACIDVEN 1.2 1.1  --   --   --   --   VANCOTROUGH  --   --   --  9*  --  16    Estimated Creatinine Clearance: 130.1 mL/min (by C-G formula based on SCr of 0.89 mg/dL).    Allergies  Allergen Reactions  . Vicodin [Hydrocodone-Acetaminophen] Itching  . Codeine Nausea And Vomiting  . Codeine   . Tramadol Other (See Comments)    "messes with head"  . Tramadol   . Vicodin [Hydrocodone-Acetaminophen]     Antimicrobials this admission: 8/4 vancomycin >>  8/4 Zosyn >> 8/4  Dose adjustments this admission: 8/6 vancomycin increased from 1500 mg IV q12h to 1250 mg IV q8h  Microbiology results: 8/6 BCx: NGTD 8/3 BCx: MRSA 8/4 Abscess rt arm: pending  Thank you for allowing pharmacy to be a part of this patient's care.  10/6, PharmD, BCPS 04/06/2020 9:55 AM

## 2020-04-06 NOTE — Progress Notes (Signed)
Subjective:   No acute events overnight.  Patient evaluated at bedside this afternoon. Mr. Klumpp was feeling pretty well. Denies fevers, chills, difficulty breathing, n/v, constipation. His arm pain is still persistent and making it difficult to sleep. He requests a possible increase in his dosing.  He denies any numbness/tingling in his hand.   Objective:  Vital signs in last 24 hours: Vitals:   04/05/20 2135 04/06/20 0613 04/06/20 0730 04/06/20 1343  BP: 97/63 124/87  107/74  Pulse: 94 96  89  Resp: 18 18 16 20   Temp: 99.9 F (37.7 C) 98.2 F (36.8 C)  98.4 F (36.9 C)  TempSrc: Oral Oral  Oral  SpO2: 100% 100%  99%  Weight:      Height:       Physical Exam:  General: awake, alert, sitting up in bed in NAD. Flat affect CV: RRR; no murmurs, rubs or gallops MSK: right hand able to make a fist without difficulty    Assessment/Plan:  Principal Problem:   Cellulitis of right forearm Active Problems:   Opioid dependence with opioid-induced mood disorder (HCC)   Abscess of forearm, right   LFTs abnormal   IVDU (intravenous drug user)   MDD (major depressive disorder), recurrent severe, without psychosis (HCC)   Opioid use disorder, severe, dependence (HCC)   COVID-19   Fever   Foreign body in right forearm  Mr. Noguez is a 34 year old person who uses IV drugs(heroin), history of MRSA/Candida glabrata bacteremia in 03/2019 (subsequent 3-week hospital stay), major depressive disorder with psychosis, hepatitis C virus (untreated)admitted with 3-day history of swelling of the right forearm with associated pain and erythemaand found to have R antecubital abscess c/b MRSA bacteremia and positive forSARS-CoV-2. This is hospital day #4.  R antecubital abscess c/b MRSA bacteremia S/p I&D byOrtho on 04/01/20. Afebrile and hemodynamically stable.Pain is tolerable on current pain regimen.Ortho would like bandage in place until wound check. 3/4BCxpositive for MRSA. TTE  without evidence of vegetations.Likely needs TEEto disprove endocarditis,however per cardiology risk of TEE in a COVID-positive patient may outweigh benefit of doing TEE. Either way, he will require 2 weeks of IV vancomycin in the hospital (today is day6of vancomycin). Of note, two doses of vanc were missed yesterday for unknown reasons. 8/7 ID has provided recs for possible regimen if we cannot disprove endocarditis as below. 8/6 repeat BCx without growth @ 3 days. - Ortho to follow-up for wound check - ID consulted, appreciate recs - 8/6 repeat BCxno growth 3 days -ContinueIVvancomycin (today is day6of 14). If unable to disprove endocarditis with TEE, would discharge with additional 4 weeks of PO Zyvox. -for better pain control, will increase Oxy 15 mg q 4 hours   Positive SARS-CoV-2 Present on admission. Patient with mild cough however otherwise asymptomatic and without oxygen requirement so no treatment is indicated. Tested positive on 8/4, which we will call Day 1. Today is Day6of infection. We will plan on retesting on day 7 or 8 and if negative, he can come off isolation.  Opiate use disorder (PWID) Last used 3 daysprior to admission;as tried suboxone &methadone in past without success. Currently denying withdrawal symptoms.COWS have been 0.Continue pain control as above.  Hepatitis C virus-untreated Hep C viral load in February 2021 was 9 million.ID will plan to follow-up and treat as an outpatient  Major depressive disorder Multiple suicidal attempts -Continue duloxetine -Will benefit from following up with [facility] outpatient  [facility], DO 04/06/2020, 2:46 PM Pager: [phone] After 5pm on  weekdays and 1pm on weekends: On Call pager [phone]

## 2020-04-06 NOTE — Progress Notes (Signed)
ID Progress Note, Brief:    Jon Castro is a 34 y.o. male with MRSA bacteremia and AC abscess now s/p I&D (grew out group A streptococcus and MRSA). He needs TEE to evaluate properly endocarditis, which he is at higher risk for given injection drug use.   I called Cardiology to discuss - would like to wait the 2 weeks isolation period before doing test. I explained that he is only mildly symptomatic for COVID and it was incidentally found to be positive.   Would re-consult Cardiology to schedule TEE nearing the end of 14d treatment course. For now continue Vancomycin IV to help with duration of treatment.   His arm appears to be healing well s/p debridement.   If he plans to leave prior to arranging definitive TEE would send him home with 4 weeks linezolid PO.   He has chronic hepatitis C infection with (+) RNA in Feb 2021. Will be happy to arrange treatment for him outpatient if he desires and after current acute illness.     Rexene Alberts, MSN, NP-C [facility]  [email or web address] Pager: [phone] Office: [phone] [facility] Main Line: [phone]

## 2020-04-07 DIAGNOSIS — L02414 Cutaneous abscess of left upper limb: Secondary | ICD-10-CM

## 2020-04-07 LAB — BASIC METABOLIC PANEL
Anion gap: 12 (ref 5–15)
BUN: 12 mg/dL (ref 6–20)
CO2: 18 mmol/L — ABNORMAL LOW (ref 22–32)
Calcium: 8.7 mg/dL — ABNORMAL LOW (ref 8.9–10.3)
Chloride: 107 mmol/L (ref 98–111)
Creatinine, Ser: 0.94 mg/dL (ref 0.61–1.24)
GFR calc Af Amer: >60 mL/min (ref >60)
GFR calc non Af Amer: >60 mL/min (ref >60)
Glucose, Bld: 154 mg/dL — ABNORMAL HIGH (ref 70–99)
Potassium: 4.1 mmol/L (ref 3.5–5.1)
Sodium: 137 mmol/L (ref 135–145)

## 2020-04-07 LAB — SARS CORONAVIRUS 2 BY RT PCR (HOSPITAL ORDER, PERFORMED IN ~~LOC~~ HOSPITAL LAB): SARS Coronavirus 2: POSITIVE — AB

## 2020-04-07 MED ORDER — OXYCODONE HCL 5 MG PO TABS
5.0000 mg | ORAL_TABLET | ORAL | Status: DC | PRN
  Administered 2020-04-20 – 2020-04-22 (×6): 5 mg via ORAL
  Filled 2020-04-07 (×6): qty 1

## 2020-04-07 MED ORDER — OXYCODONE HCL 5 MG PO TABS
15.0000 mg | ORAL_TABLET | ORAL | Status: DC
  Administered 2020-04-07 – 2020-04-08 (×5): 15 mg via ORAL
  Filled 2020-04-07 (×5): qty 3

## 2020-04-07 NOTE — Progress Notes (Signed)
  Date: 04/07/2020  Patient name: Jon Castro  Medical record number: [id number]  Date of birth: 09-18-1985    Subjective: Jon Castro has no new problems.  There has been improvement in pain management with the adjustment made yesterday, although not quite at optimal control.  Objective:  Vital signs in last 24 hours: Vitals:   04/06/20 1343 04/06/20 2116 04/07/20 0453 04/07/20 1330  BP: 107/74 104/68 109/69 114/74  Pulse: 89 80 68 74  Resp: 20 18 18 19   Temp: 98.4 F (36.9 C) 98.2 F (36.8 C) 98.9 F (37.2 C) 97.9 F (36.6 C)  TempSrc: Oral Oral  Oral  SpO2: 99% 100% 100% 99%  Weight:   77 kg   Height:        Physical Exam: Better spirits than yesterday, more conversant.  Lungs are clear, heart without murmur.  Ace wrap over his surgical dressing seems to be slipping somewhat, though this was not to be disturbed until his postop follow-up with the surgeon.   Assessment/Plan:  Principal Problem:   Cellulitis of right forearm Active Problems:   Opioid dependence with opioid-induced mood disorder (HCC)   Abscess of forearm, right   LFTs abnormal   IVDU (intravenous drug user)   MDD (major depressive disorder), recurrent severe, without psychosis (HCC)   Opioid use disorder, severe, dependence (HCC)   COVID-19   Fever   Foreign body in right forearm  Covid symptoms have resolved.  Today is day 7 from diagnosis and he will be tested again to determine whether he can move off of this unit.  IVDU associated right antecubital abscess status post I&D, associated with MRSA bacteremia, undergoing treatment with IV vancomycin for 14 total days in hospital.  Treatment course has been uncomplicated.  Plan is for TEE prior to discharge which will determine whether prolonged course of oral antibiotics will be necessary.  Pain remains uncontrolled due to his high tolerance to medications related to heroin use.  We will increase his oxycodone dose to 20 mg.  He is not  exhibiting drug-seeking behavior.   , MD 04/07/2020, 5:28 PM

## 2020-04-07 NOTE — Plan of Care (Signed)
°  Problem: Education: Goal: Knowledge of General Education information will improve Description: Including pain rating scale, medication(s)/side effects and non-pharmacologic comfort measures Outcome: Progressing   Problem: Health Behavior/Discharge Planning: Goal: Ability to manage health-related needs will improve Outcome: Progressing   Problem: Clinical Measurements: Goal: Ability to maintain clinical measurements within normal limits will improve Outcome: Progressing Goal: Will remain free from infection Outcome: Progressing Goal: Diagnostic test results will improve Outcome: Progressing Goal: Respiratory complications will improve Outcome: Progressing Goal: Cardiovascular complication will be avoided Outcome: Progressing   Problem: Activity: Goal: Risk for activity intolerance will decrease Outcome: Progressing   Problem: Nutrition: Goal: Adequate nutrition will be maintained Outcome: Progressing   Problem: Coping: Goal: Level of anxiety will decrease Outcome: Progressing   Problem: Elimination: Goal: Will not experience complications related to bowel motility Outcome: Progressing Goal: Will not experience complications related to urinary retention Outcome: Progressing   Problem: Pain Managment: Goal: General experience of comfort will improve Outcome: Progressing   Problem: Safety: Goal: Ability to remain free from injury will improve Outcome: Progressing   Problem: Skin Integrity: Goal: Risk for impaired skin integrity will decrease Outcome: Progressing   Problem: Education: Goal: Knowledge of risk factors and measures for prevention of condition will improve Outcome: Progressing   Problem: Coping: Goal: Psychosocial and spiritual needs will be supported Outcome: Progressing   Problem: Respiratory: Goal: Will maintain a patent airway Outcome: Progressing Goal: Complications related to the disease process, condition or treatment will be avoided or  minimized Outcome: Progressing   Problem: Clinical Measurements: Goal: Ability to avoid or minimize complications of infection will improve Outcome: Progressing   Problem: Skin Integrity: Goal: Skin integrity will improve Outcome: Progressing   

## 2020-04-07 NOTE — Progress Notes (Signed)
    [facility]    Date of Admission:  03/31/2020   Total days of antibiotics 7         ID: Jon Castro is a 34 y.o. male with right ac abscess with bacteremia and asymptomatic covid Principal Problem:   Cellulitis of right forearm Active Problems:   Opioid dependence with opioid-induced mood disorder (HCC)   Abscess of forearm, right   LFTs abnormal   IVDU (intravenous drug user)   MDD (major depressive disorder), recurrent severe, without psychosis (HCC)   Opioid use disorder, severe, dependence (HCC)   COVID-19   Fever   Foreign body in right forearm    Subjective: Still some soreness to right AC after debridement.  Medications:  . DULoxetine  30 mg Oral Daily  . ibuprofen  800 mg Oral Q8H  . oxyCODONE  15 mg Oral Q4H  . rivaroxaban  10 mg Oral Daily    Objective: Vital signs in last 24 hours: Temp:  [97.9 F (36.6 C)-98.9 F (37.2 C)] 97.9 F (36.6 C) (08/10 1330) Pulse Rate:  [68-80] 74 (08/10 1330) Resp:  [18-19] 19 (08/10 1330) BP: (104-114)/(68-74) 114/74 (08/10 1330) SpO2:  [99 %-100 %] 99 % (08/10 1330) Weight:  [77 kg] 77 kg (08/10 0453) Physical Exam  Constitutional: He is oriented to person, place, and time. He appears well-developed and well-nourished. No distress.  HENT:  Mouth/Throat: Oropharynx is clear and moist. No oropharyngeal exudate.  Cardiovascular: Normal rate, regular rhythm and normal heart sounds. Exam reveals no gallop and no friction rub.  No murmur heard.  Pulmonary/Chest: Effort normal and breath sounds normal. No respiratory distress. He has no wheezes.  Abdominal: Soft. Bowel sounds are normal. He exhibits no distension. There is no tenderness.  Ext: right ac sutures in place, no erythema or drainage but one small subcm area wound is not approximated Neurological: He is alert and oriented to person, place, and time.  Skin: Skin is warm and dry. No rash noted. No erythema.  Psychiatric: He has a  normal mood and affect. His behavior is normal.   Lab Results Recent Labs    04/06/20 0147 04/07/20 0845  NA 137 137  K 4.4 4.1  CL 109 107  CO2 22 18*  BUN 13 12  CREATININE 0.89 0.94    Microbiology: 8/6 blood cx ngtd 8/3 blood cx MRSA Studies/Results: No results found.  Assessment/Plan:  MRSA bacteremia with right AC deep tissue abscess = continue on vancomycin. Will need  TEE to see if any evidence of endocarditis. His TTE was negative for endocarditis  - can safely do his TTE after 8/14 which is 10 days after his first positive test. CDC guidelines do not recommend repeat testing for clearance since PCR is ultra sensitive. Given that he is stable, he can undergo TEE sooner than 8/14 but place as last case of the day and have environmental service do UV disinfection.   If questions, please contact myself or IP for PPE guidance  If TEE is negative, can discharge on oral abtx  Covid= currently he is day 6 after positive test. Remains asymptomatic. Continue on airborne/isolation until 8/14, then can be transferred out of the unit as long as he doesn't develop worsening of symptoms  [facility] Cell: [phone] Pager: [phone]  04/07/2020, 5:36 PM

## 2020-04-08 LAB — CULTURE, BLOOD (ROUTINE X 2)
Culture: NO GROWTH
Culture: NO GROWTH
Special Requests: ADEQUATE
Special Requests: ADEQUATE

## 2020-04-08 LAB — BASIC METABOLIC PANEL
Anion gap: 8 (ref 5–15)
BUN: 12 mg/dL (ref 6–20)
CO2: 20 mmol/L — ABNORMAL LOW (ref 22–32)
Calcium: 8.5 mg/dL — ABNORMAL LOW (ref 8.9–10.3)
Chloride: 106 mmol/L (ref 98–111)
Creatinine, Ser: 0.8 mg/dL (ref 0.61–1.24)
GFR calc Af Amer: >60 mL/min (ref >60)
GFR calc non Af Amer: >60 mL/min (ref >60)
Glucose, Bld: 107 mg/dL — ABNORMAL HIGH (ref 70–99)
Potassium: 4.1 mmol/L (ref 3.5–5.1)
Sodium: 134 mmol/L — ABNORMAL LOW (ref 135–145)

## 2020-04-08 MED ORDER — OXYCODONE HCL 5 MG PO TABS
20.0000 mg | ORAL_TABLET | ORAL | Status: DC
  Administered 2020-04-08 – 2020-04-20 (×72): 20 mg via ORAL
  Filled 2020-04-08 (×73): qty 4

## 2020-04-08 NOTE — Progress Notes (Signed)
° °  Subjective:   No acute events overnight.  Patient evaluated at bedside this morning. He says he is "feeling alright." States that the increased dose of oxy had good effect. Has no complaints. Denies symptoms of withdrawal or constipation. Shared with him ID's recs for for transferring out of airborne/isolation and obtaining TEE. He expressed understanding. Says ortho came by to change his dressing.  Objective:  Vital signs in last 24 hours: Vitals:   04/07/20 1330 04/07/20 2114 04/08/20 0532 04/08/20 1306  BP: 114/74 115/70 117/77 110/74  Pulse: 74 81 66 74  Resp: 19 20 16 17   Temp: 97.9 F (36.6 C) 98.8 F (37.1 C) 98.5 F (36.9 C) 98.1 F (36.7 C)  TempSrc: Oral Oral Oral Oral  SpO2: 99% 99% 100% 98%  Weight:      Height:       Physical Exam: General: awake, alert, sitting up in bed in NAD CV: RRR; no murmurs, rubs or gallops MSK: right hand able to make a fist without difficulty, sensation grossly intact  Assessment/Plan:  Principal Problem:   Cellulitis of right forearm Active Problems:   Opioid dependence with opioid-induced mood disorder (HCC)   Abscess of forearm, right   LFTs abnormal   IVDU (intravenous drug user)   MDD (major depressive disorder), recurrent severe, without psychosis (HCC)   Opioid use disorder, severe, dependence (HCC)   COVID-19   Fever   Foreign body in right forearm  Jon Castro is a 34 year old person who uses IV drugs(heroin), history of MRSA/Candida glabrata bacteremia in 03/2019 (subsequent 3-week hospital stay), major depressive disorder with psychosis, hepatitis C virus (untreated)admitted with 3-day history of swelling of the right forearm with associated pain and erythemaand found to have R antecubital abscess c/b MRSA bacteremia and positive forSARS-CoV-2. This is hospital day #6.  R antecubital abscess c/b MRSA bacteremia S/p I&D byOrtho on 04/01/20.Afebrile and hemodynamically stable.Pain istolerable on current pain  regimen.TTE without evidence of vegetations. Per ID, can safely do TEE after 8/14 which is 10 days after his first positive test. If can disprove endocarditis, patient will not have to continue abx at discharge, 2 weeks of IV vanc will suffice. Today is day 8 of 14 for vancomycin. Also per ID, we will continue airborne/isolation until 8/14 then can be transferred out of COVID unit. - ID consulted, appreciate recs - 8/6 repeat BCxNGTD -ContinueIVvancomycin (today is day8of 14). If unable to disprove endocarditis with TEE, would discharge with additional 4 weeks of PO Zyvox. - Oxy 20 mg q4 hours; oxy 5 mg q4h PRN for breakthrough pain  Positive SARS-CoV-2 Present on admission. Patient with mild cough however otherwise asymptomatic and without oxygen requirement so no treatment is indicated. Per ID, we will continue airborne/isolation until 8/14 then can be transferred out of COVID unit.  Opiate use disorder (PWID) Last used 3 daysprior to admission;as tried suboxone &methadone in past without success. Currently denying withdrawal symptoms.COWS have been 0.Continue pain control as above.  Hepatitis C virus-untreated Hep C viral load in February 2021 was 9 million.ID will plan to follow-up and treat as an outpatient  Major depressive disorder Multiple suicidal attempts -Continue duloxetine -Will benefit from following up with [facility] outpatient  [facility], MD 04/08/2020, 5:39 PM Pager: [phone] After 5pm on weekdays and 1pm on weekends: On Call pager [phone]

## 2020-04-08 NOTE — Plan of Care (Signed)
°  Problem: Education: Goal: Knowledge of General Education information will improve Description: Including pain rating scale, medication(s)/side effects and non-pharmacologic comfort measures Outcome: Progressing   Problem: Health Behavior/Discharge Planning: Goal: Ability to manage health-related needs will improve Outcome: Progressing   Problem: Clinical Measurements: Goal: Ability to maintain clinical measurements within normal limits will improve Outcome: Progressing Goal: Will remain free from infection Outcome: Progressing Goal: Diagnostic test results will improve Outcome: Progressing Goal: Respiratory complications will improve Outcome: Progressing Goal: Cardiovascular complication will be avoided Outcome: Progressing   Problem: Activity: Goal: Risk for activity intolerance will decrease Outcome: Progressing   Problem: Nutrition: Goal: Adequate nutrition will be maintained Outcome: Progressing   Problem: Coping: Goal: Level of anxiety will decrease Outcome: Progressing   Problem: Elimination: Goal: Will not experience complications related to bowel motility Outcome: Progressing Goal: Will not experience complications related to urinary retention Outcome: Progressing   Problem: Pain Managment: Goal: General experience of comfort will improve Outcome: Progressing   Problem: Safety: Goal: Ability to remain free from injury will improve Outcome: Progressing   Problem: Skin Integrity: Goal: Risk for impaired skin integrity will decrease Outcome: Progressing   Problem: Education: Goal: Knowledge of risk factors and measures for prevention of condition will improve Outcome: Progressing   Problem: Coping: Goal: Psychosocial and spiritual needs will be supported Outcome: Progressing   Problem: Respiratory: Goal: Will maintain a patent airway Outcome: Progressing Goal: Complications related to the disease process, condition or treatment will be avoided or  minimized Outcome: Progressing   Problem: Clinical Measurements: Goal: Ability to avoid or minimize complications of infection will improve Outcome: Progressing   Problem: Skin Integrity: Goal: Skin integrity will improve Outcome: Progressing

## 2020-04-09 LAB — BASIC METABOLIC PANEL
Anion gap: 10 (ref 5–15)
BUN: 11 mg/dL (ref 6–20)
CO2: 21 mmol/L — ABNORMAL LOW (ref 22–32)
Calcium: 8.9 mg/dL (ref 8.9–10.3)
Chloride: 106 mmol/L (ref 98–111)
Creatinine, Ser: 0.86 mg/dL (ref 0.61–1.24)
GFR calc Af Amer: >60 mL/min (ref >60)
GFR calc non Af Amer: >60 mL/min (ref >60)
Glucose, Bld: 86 mg/dL (ref 70–99)
Potassium: 4.2 mmol/L (ref 3.5–5.1)
Sodium: 137 mmol/L (ref 135–145)

## 2020-04-09 NOTE — Progress Notes (Addendum)
   Subjective:   All is well with Jon Castro today.  He reports that he continues to cough and has been bringing up more sputum.  There is no shortness of breath, chest pain, or malaise.  Appetite has been good, and he is looking forward to pizza to be delivered by his father to the hospital. Objective:  Vital signs in last 24 hours: Vitals:   04/10/20 1600 04/10/20 2019 04/11/20 0548 04/11/20 0800  BP: 106/65 101/71 101/62   Pulse: 72 70 66 72  Resp: 18 16 18    Temp: 98.5 F (36.9 C) 98.5 F (36.9 C) 98.1 F (36.7 C)   TempSrc: Oral  Oral   SpO2: 100% 100% 99%   Weight:      Height:       Physical Exam:In good spirits, relaxed affect, engages in conversation but is soft-spoken.  Heart with systolic murmur about 2/6 over the base and left sternal border.  There are bilateral rales posteriorly in the bases and scattered rhonchi which shift with cough.  Skin is warm and dry, abdomen is flat.  Extremities are all well perfused, including the right hand and forearm distal to his postop dressing.  Assessment/Plan:  Principal Problem:   MRSA bacteremia Active Problems:   Abscess of forearm, right   IVDU (intravenous drug user)   Opioid use disorder, severe, dependence (HCC)   COVID-19   R antecubital abscess c/b MRSA bacteremia S/p I&D byOrtho on 04/01/20.Afebrile and hemodynamically stable.Pain istolerable on current pain regimen.TTE without evidence of vegetations. Per ID, can safely do TEE after 8/14 which is 10 days after his first positive test. If can disprove endocarditis, patient will not have to continue abx at discharge, 2 weeks of IV vanc will suffice. Today is day 9  of 14 for vancomycin. Also per ID, we will continue airborne/isolation until 8/14 (Saturday) then can be transferred out of COVID unit. - ID consulted, appreciate recs - 8/6 repeat BCxNGTD -ContinueIVvancomycin (today is day9of 14). If unable to disprove endocarditis with TEE, would discharge with  additional 4 weeks of PO Zyvox. - Oxy 20 mg q4 hours; oxy 5 mg q4h PRN for breakthrough pain  Positive SARS-CoV-2 Present on admission. Patient with mild cough however otherwise asymptomatic and without oxygen requirement so no treatment is indicated. Per ID, we will continue airborne/isolation until 8/14 (Saturday) then can be transferred out of COVID unit.  Opiate use disorder (PWID) Last used 3 daysprior to admission;as tried suboxone &methadone in past without success. Currently denying withdrawal symptoms.COWS have been 0.Continue pain control as above.  Hepatitis C virus-untreated Hep C viral load in February 2021 was 9 million.ID will plan to follow-up and treat as an outpatient  Major depressive disorder Multiple suicidal attempts -Continue duloxetine -Will benefit from following up with [facility] outpatient  [facility], MD 04/11/2020, 12:06 PM Pager: [phone] After 5pm on weekdays and 1pm on weekends: On Call pager [phone]

## 2020-04-09 NOTE — Progress Notes (Signed)
Pharmacy Antibiotic Note  Jon Castro is a 34 y.o. male admitted on 03/31/2020 with wound infxn and MRSA bacteremia .  Pharmacy has been consulted for Vancomycin dosing.  ID: COVID + (incidental asymp). Vanc for wound infxn and MRSA bacteremia  **pt missed vanc x2 doses 8/7 no documented reason, SZP entered** - ECHO/TTE neg endocarditis, TEE?  - Afebrile, WBC 10.6>8.9, SCr 0.86, LA 1.2  Vanc 8/4>> Zosyn 8/4>>8/5  8/6: Vanco trough 9: incr 1250mg /8hr 8/9: VT 16: no change  8/3 BCx: MRSA 8/4 R arm abscess: rare staph aureus, rare grp A strep  8/4 COVID: positive. 8/6 Bcx: ng <24h   Plan: Cont Vanco 1250mg  IV q8hrs Needs TEE Airborne isolation through 8/14.  Height: 6\' 1"  (185.4 cm) Weight: 77 kg (169 lb 12.1 oz) IBW/kg (Calculated) : 79.9  Temp (24hrs), Avg:98.3 F (36.8 C), Min:98.1 F (36.7 C), Max:98.4 F (36.9 C)  Recent Labs  Lab 04/03/20 2202 04/05/20 0420 04/06/20 0147 04/07/20 0845 04/08/20 0050 04/09/20 0620  CREATININE  --  0.83 0.89 0.94 0.80 0.86  VANCOTROUGH 9*  --  16  --   --   --     Estimated Creatinine Clearance: 130.6 mL/min (by C-G formula based on SCr of 0.86 mg/dL).    Allergies  Allergen Reactions  . Vicodin [Hydrocodone-Acetaminophen] Itching  . Codeine Nausea And Vomiting  . Codeine   . Tramadol Other (See Comments)    "messes with head"  . Tramadol   . Vicodin [Hydrocodone-Acetaminophen]    Tonio Seider S. 06/07/20, PharmD, BCPS Clinical Staff Pharmacist [email or web address]  06/08/20 04/09/2020 9:34 AM

## 2020-04-09 NOTE — Progress Notes (Signed)
  Date: 04/09/2020  Patient name: Jon Castro  Medical record number: [id number]  Date of birth: 04-Mar-1986    Subjective: Jon Castro is much more comfortable with the new pain management regimen.  Nothing new to report.  Occasional cough, no discomfort, no questions or concerns.  He is aware of the plan.  Objective:  Vital signs in last 24 hours: Vitals:   04/08/20 0532 04/08/20 1306 04/08/20 2049 04/09/20 0512  BP: 117/77 110/74 107/63 (!) 93/58  Pulse: 66 74 64 79  Resp: 16 17 16 18   Temp: 98.5 F (36.9 C) 98.1 F (36.7 C) 98.4 F (36.9 C) 98.3 F (36.8 C)  TempSrc: Oral Oral Oral Oral  SpO2: 100% 98% 100% 95%  Weight:      Height:        Physical Exam: Sitting up in bed, R arm dressing and ACE wrap in place, R hand neurovasc intact.  Despite cough, abnormal breath sounds are not auscultated.  Breathing is comfortable.  No heart murmur.  Relaxed, open and positive affect.   Assessment/Plan:  Principal Problem:   R antecubital abscess and MRSA bacteremia due to IVDU  Active Problems:   Opioid dependence with opioid-induced mood disorder (HCC)   Opioid use disorder, severe, dependence (HCC)   COVID-19, mild      Stable and continuing therapy as planned.  Vanco D 8/14 for bacteremia. TEE before discharge. Transfer off Covid unit soon. Pain well controlled.  No changes today.   Dispo: Anticipated discharge in approximately 7 day(s).   9/14, MD 04/09/2020, 3:46 PM

## 2020-04-10 LAB — BASIC METABOLIC PANEL
Anion gap: 9 (ref 5–15)
BUN: 10 mg/dL (ref 6–20)
CO2: 19 mmol/L — ABNORMAL LOW (ref 22–32)
Calcium: 8.2 mg/dL — ABNORMAL LOW (ref 8.9–10.3)
Chloride: 107 mmol/L (ref 98–111)
Creatinine, Ser: 0.71 mg/dL (ref 0.61–1.24)
GFR calc Af Amer: >60 mL/min (ref >60)
GFR calc non Af Amer: >60 mL/min (ref >60)
Glucose, Bld: 85 mg/dL (ref 70–99)
Potassium: 4.1 mmol/L (ref 3.5–5.1)
Sodium: 135 mmol/L (ref 135–145)

## 2020-04-11 ENCOUNTER — Encounter (HOSPITAL_COMMUNITY): Payer: Self-pay | Admitting: Internal Medicine

## 2020-04-11 DIAGNOSIS — R7881 Bacteremia: Secondary | ICD-10-CM

## 2020-04-11 DIAGNOSIS — B9562 Methicillin resistant Staphylococcus aureus infection as the cause of diseases classified elsewhere: Secondary | ICD-10-CM

## 2020-04-11 NOTE — Progress Notes (Addendum)
Patient will be transferred to 5N per order; report was given to the nurse in 5N.

## 2020-04-11 NOTE — Progress Notes (Signed)
  Date: 04/11/2020  Patient name: Jon Castro  Medical record number: [id number]  Date of birth: [birth year]    Subjective: Jon Castro is feeling just fine today, with no new problems to report.  His cough steadily improves, decreased frequency and sputum production.  No shortness of breath and he feels comfortable.  Pain is well managed in the right antecubital surgical area.  He is eating well.  Objective:  Vital signs in last 24 hours: Vitals:   04/10/20 1600 04/10/20 2019 04/11/20 0548 04/11/20 0800  BP: 106/65 101/71 101/62   Pulse: 72 70 66 72  Resp: 18 16 18    Temp: 98.5 F (36.9 C) 98.5 F (36.9 C) 98.1 F (36.7 C)   TempSrc: Oral  Oral   SpO2: 100% 100% 99%   Weight:      Height:        Physical Exam: Aside from being somewhat disheveled from being in the hospital for several days, he looks well.  Few scattered crackles in each base posteriorly.  Heart regular rate and rhythm with a 2/6 systolic murmur heard over the base and along left sternal border.  He no longer winces when he moves his right arm about.   Assessment/Plan:  Active Problems:   MRSA bacteremia   Abscess of forearm, right   IVDU (intravenous drug user)   Opioid use disorder, severe, dependence (HCC)   COVID-19  Jon Castro is doing well.  He ended up developing cough and mild shortness of breath due to Covid, but symptoms are minimal at this point.  He tested positive on 04/01/2020, so as of today, he should be able to discontinue isolation.  Today is day 10/14 of vancomycin for MRSA bacteremia due to right antecubital abscess related to IVDU.  Next step is TEE which could be done as soon as Monday 04/13/20 if all parties are in agreement and available.  He  will remain in the hospital until his IV antibiotics are completed.  Abscess pain is well controlled; will begin making adjustments to reduce regimen.  He has high tolerance to medication due to heroin use.  Dispo: Anticipated discharge in  approximately 4-5 day(s).   04/15/20 A. Raynelle Fanning, MD 04/11/2020, 11:45 AM

## 2020-04-12 LAB — VANCOMYCIN, TROUGH: Vancomycin Tr: 18 ug/mL (ref 15–20)

## 2020-04-12 NOTE — Plan of Care (Signed)
?  Problem: Clinical Measurements: ?Goal: Ability to avoid or minimize complications of infection will improve ?Outcome: Progressing ?  ?Problem: Skin Integrity: ?Goal: Skin integrity will improve ?Outcome: Progressing ?  ?

## 2020-04-12 NOTE — Progress Notes (Signed)
Pharmacy Antibiotic Note  Jon Castro is a 34 y.o. male admitted on 03/31/2020 with wound infxn and MRSA bacteremia . Patient also found incidentally to be COVID+, now 10 days from initial positive PCR and out of airborne isolation. Pharmacy has been consulted for Vancomycin dosing.  Day 11/14 of vancomycin. Vancomycin trough this AM is therapeutic at 18 mcg/mL (goal trough 15-20 mcg/mL). Patient remains afebrile, WBC wnl. Renal function is stable. Endocarditis workup so far is negative on ECHO and TTE, awaiting TEE.  Plan: Continue vancomycin 1250mg  IV q8h F/u TEE result, monitor renal function  Height: 6\' 1"  (185.4 cm) Weight: 77 kg (169 lb 12.1 oz) IBW/kg (Calculated) : 79.9  Temp (24hrs), Avg:98.6 F (37 C), Min:98.5 F (36.9 C), Max:98.9 F (37.2 C)  Recent Labs  Lab 04/06/20 0147 04/07/20 0845 04/08/20 0050 04/09/20 0620 04/10/20 0322 04/12/20 0859  CREATININE 0.89 0.94 0.80 0.86 0.71  --   VANCOTROUGH 16  --   --   --   --  18    Estimated Creatinine Clearance: 140.4 mL/min (by C-G formula based on SCr of 0.71 mg/dL).    Allergies  Allergen Reactions  . Vicodin [Hydrocodone-Acetaminophen] Itching  . Codeine Nausea And Vomiting  . Codeine   . Tramadol Other (See Comments)    "messes with head"  . Tramadol   . Vicodin [Hydrocodone-Acetaminophen]    Antimicrobial this admission: Vanc 8/4>> Zosyn 8/4>>8/5  Dose Adjustment this admission: 8/6: VT 9: incr 1250mg /8hr 8/9: VT 16: no change  Microbiology this admission: 8/3 BCx: MRSA 8/4 R arm abscess: rare staph aureus, rare grp A strep  8/4 COVID: positive. 8/6 Bcx: NGTD  10/3, PharmD PGY2 Pharmacy Resident Phone between 7 am - 3:30 pm: 10/4  Please check [employer] for all [facility] Pharm[facility] phone numbers After 10:00 PM, call Main Pharmacy [phone]  04/12/2020 11:07 AM

## 2020-04-12 NOTE — Hospital Course (Signed)
Jon Castro was evaluated at the bedside in 5N. He states States his cough is the same. The pain in his arm is a little better. Pain med does not make him sleepy. States he will

## 2020-04-12 NOTE — Progress Notes (Signed)
° °  Subjective: Jon Castro was moved out of COVID isolation last night. He continues to feel well without any new concerns. Cough is improved. He has been trying to do more with the right arm which increases pain a bit, but overall well controlled on current regimen. No issues with constipation.   Objective:  Vital signs in last 24 hours: Vitals:   04/11/20 1425 04/11/20 1600 04/11/20 1951 04/12/20 0512  BP: 111/63  109/64 99/60  Pulse: 68 66 72 66  Resp: 18  18 18   Temp: 98.5 F (36.9 C)  98.5 F (36.9 C) 98.9 F (37.2 C)  TempSrc: Oral  Oral Oral  SpO2: 100%  100% 100%  Weight:      Height:       General: mildly disheveled, lying comfortably in bed CV: RRR; 2/6 murmur heard at LUSB  Ext: moving RUE better with less pain   Assessment/Plan:  Principal Problem:   MRSA bacteremia Active Problems:   Abscess of forearm, right   IVDU (intravenous drug user)   Opioid use disorder, severe, dependence (HCC)   COVID-19  Jon Castro is a 34 year old person who uses IV drugs(heroin), history of MRSA/Candida glabrata bacteremia in 03/2019 (subsequent 3-week hospital stay), major depressive disorder with psychosis, hepatitis C virus (untreated)admitted with 3-day history of swelling of the right forearm with associated pain and erythemaand found to have R antecubital abscess c/b MRSA bacteremia and positive forSARS-CoV-2. This is hospital day #11.  R antecubital abscess c/b MRSA bacteremia S/p I&D byOrtho on 04/01/20.He has made steady improvements in terms of pain and ability to move the arm. His is on day day 11/14 of vancomycin for MRSA bacteremia. Regarding work-up, his TTE did not show evidence of vegetations. However, he does have a murmur on exam. Now that he is out of COVID isolation, will touch base with cardiology to get his TEE done within the next 3 days which will be when he completes his antibiotic course.   Positive SARS-CoV-2 Present on admission, tested positive on  04/01/20. Patient ended up developing mild cough and shortness of breath, but did not require treatment. He is now off isolation per protocol.   06/01/20 D, DO 04/12/2020, 1:01 PM Pager: [phone] After 5pm on weekdays and 1pm on weekends: On Call pager [phone]

## 2020-04-13 LAB — CBC
HCT: 31.5 % — ABNORMAL LOW (ref 39.0–52.0)
Hemoglobin: 10 g/dL — ABNORMAL LOW (ref 13.0–17.0)
MCH: 25.9 pg — ABNORMAL LOW (ref 26.0–34.0)
MCHC: 31.7 g/dL (ref 30.0–36.0)
MCV: 81.6 fL (ref 80.0–100.0)
Platelets: 175 10*3/uL (ref 150–400)
RBC: 3.86 MIL/uL — ABNORMAL LOW (ref 4.22–5.81)
RDW: 14.5 % (ref 11.5–15.5)
WBC: 7.5 10*3/uL (ref 4.0–10.5)
nRBC: 0 % (ref 0.0–0.2)

## 2020-04-13 MED ORDER — FLUTICASONE PROPIONATE 50 MCG/ACT NA SUSP
2.0000 | Freq: Every day | NASAL | Status: DC
  Administered 2020-04-13 – 2020-04-22 (×7): 2 via NASAL
  Filled 2020-04-13 (×2): qty 16

## 2020-04-13 MED ORDER — ACETAMINOPHEN 325 MG PO TABS
650.0000 mg | ORAL_TABLET | Freq: Four times a day (QID) | ORAL | Status: DC | PRN
  Administered 2020-04-14: 650 mg via ORAL
  Filled 2020-04-13: qty 2

## 2020-04-13 MED ORDER — VANCOMYCIN HCL 1250 MG/250ML IV SOLN
1250.0000 mg | Freq: Three times a day (TID) | INTRAVENOUS | Status: DC
  Administered 2020-04-13 – 2020-04-15 (×5): 1250 mg via INTRAVENOUS
  Filled 2020-04-13 (×9): qty 250

## 2020-04-13 NOTE — Progress Notes (Signed)
   Subjective:   No acute events overnight.  Mr. Hodgman was seen and evaluated on morning rounds. He is doing well, but has had a headache the last 2 days. Also endorse some nasal congestion. No shortness of breath. Cough is continuing to improve.  The pain is well controlled in his arm. Moving his bowels well.  When he leaves here, he plans to go stay with his dad in [place].  We discussed follow-up when he leaves. He is interested in following up with [facility]   Objective:  Vital signs in last 24 hours: Vitals:   04/12/20 0512 04/12/20 0853 04/12/20 2031 04/13/20 0410  BP: 99/60 108/69 117/70 101/67  Pulse: 66 72 82 80  Resp: 18 16 17 17   Temp: 98.9 F (37.2 C) 98.7 F (37.1 C) 99.8 F (37.7 C) 98.9 F (37.2 C)  TempSrc: Oral Oral Oral Oral  SpO2: 100% 100% 100% 99%  Weight:      Height:       Physical Exam: General: mildly disheveled, sitting in chair, affect continues to be flat CV: RRR; 2/6 murmur heard at LUSB  Ext: moving RUE better with less pain   Assessment/Plan:  Principal Problem:   MRSA bacteremia Active Problems:   Abscess of forearm, right   IVDU (intravenous drug user)   Opioid use disorder, severe, dependence (HCC)   COVID-19  Mr. Rabbani is a 34 year old person who uses IV drugs(heroin), history of MRSA/Candida glabrata bacteremia in 03/2019 (subsequent 3-week hospital stay), major depressive disorder with psychosis, hepatitis C virus (untreated)admitted with 3-day history of swelling of the right forearm with associated pain and erythemaand found to have R antecubital abscess c/b MRSA bacteremia and positive forSARS-CoV-2. This is hospital day #12.  R antecubital abscess c/b MRSA bacteremia S/p I&D byOrtho on 04/01/20.He has made steady improvements in terms of pain and ability to move the arm. He is on day day 12/14 of vancomycin for MRSA bacteremia. Regarding work-up, his TTE did not show evidence of vegetations. However, he does have a murmur  on exam.  - TEE tomorrow (8/17)  - Vancomycin day 12 of 14 (last day 8/18)  Positive SARS-CoV-2 Present on admission, tested positive on 04/01/20. Patient ended up developing mild cough and shortness of breath, but did not require treatment. He is now off isolation per protocol. Today endorsing mild headache and nasal congestion. - acetaminophen 650 mg q6h PRN  - Flonase daily  06/01/20, MD 04/13/2020, 6:37 AM Pager: [phone] After 5pm on weekdays and 1pm on weekends: On Call pager [phone]

## 2020-04-13 NOTE — Progress Notes (Signed)
Per Infection control Jon Castro pt is clear from Airborne precaution, positive for COVID 11 days ago.  Use contact precaution for MRSA bacteremia.

## 2020-04-13 NOTE — Plan of Care (Signed)

## 2020-04-13 NOTE — Progress Notes (Signed)
°    [facility]    Date of Admission:  03/31/2020   Total days of antibiotics 13         ID: Jon Castro is a 34 y.o. male with right ac abscess with bacteremia and asymptomatic covid Principal Problem:   MRSA bacteremia Active Problems:   Abscess of forearm, right   IVDU (intravenous drug user)   Opioid use disorder, severe, dependence (HCC)   COVID-19    Subjective: Sore with right AC - has not had dressing changed since our last visit.  Orthopedics has not seen him back.  No other complaints.    Medications:   DULoxetine  30 mg Oral Daily   fluticasone  2 spray Each Nare Daily   oxyCODONE  20 mg Oral Q4H   rivaroxaban  10 mg Oral Daily    Objective: Vital signs in last 24 hours: Temp:  [98.9 F (37.2 C)-99.8 F (37.7 C)] 98.9 F (37.2 C) (08/16 1000) Pulse Rate:  [78-82] 78 (08/16 1000) Resp:  [16-17] 16 (08/16 1000) BP: (101-117)/(62-70) 105/62 (08/16 1000) SpO2:  [99 %-100 %] 99 % (08/16 0410)  Physical Exam  Constitutional: He is oriented to person, place, and time. He appears well-developed and well-nourished. No distress.  HENT:  Mouth/Throat: Oropharynx is clear and moist. No oropharyngeal exudate.  Cardiovascular: Normal rate, regular rhythm and normal heart sounds. Exam reveals no gallop and no friction rub.  No murmur heard.  Pulmonary/Chest: Effort normal and breath sounds normal. No respiratory distress. He has no wheezes.  Abdominal: Soft. Bowel sounds are normal. He exhibits no distension. There is no tenderness.  Ext: right ac sutures in place, no erythema, some clear/serous fluid drainage noted to dressing that was dried. 1cm open area in center of incision.  Neurological: He is alert and oriented to person, place, and time.  Skin: Skin is warm and dry. No rash noted. No erythema.  Psychiatric: He has a normal mood and affect. His behavior is normal.   Lab Results Recent Labs    04/13/20 0731  WBC 7.5    HGB 10.0*  HCT 31.5*    Microbiology: 8/6 blood cx ngtd 8/3 blood cx MRSA Studies/Results: No results found.  Assessment/Plan: MRSA bacteremia  = likely related to deep AC abscess - with IVDU will need to rule out endocarditis given risk for the same; continue on vancomycin.  TEE tomorrow 8/17. If negative, can discharge on oral abtx    R AC Abscess = MRSA on cultures. Please call Dr. Bari Edward team back to see him today for ongoing surgery care if he has not seen him back. Likely some sutures can be removed on peripheral aspects of surgical incision. Change dressing daily with xeroform gauze, 4x4 and kerlix until ortho can return to assess and give wound care recs.   Covid+ = recovered; now day 13 past quarantine window. CDC guidelines do not recommend repeat testing for clearance since PCR is ultra sensitive. Given that he is stable, he can undergo TEE sooner than 8/14 but place as last case of the day and have environmental service do UV disinfection.   If questions, please contact Dr. Drue Second or IP for PPE guidance  Rexene Alberts, MSN, NP-C [facility]  [email or web address] Pager: [phone] Office: [phone] [facility] Main Line: [phone]  04/13/2020, 12:59 PM

## 2020-04-13 NOTE — Plan of Care (Signed)
?  Problem: Clinical Measurements: ?Goal: Ability to avoid or minimize complications of infection will improve ?Outcome: Progressing ?  ?Problem: Skin Integrity: ?Goal: Skin integrity will improve ?Outcome: Progressing ?  ?

## 2020-04-13 NOTE — Progress Notes (Signed)
    [facility] has been requested to perform a transesophageal echocardiogram on Jon Castro for bacteremia.  After careful review of history and examination, the risks and benefits of transesophageal echocardiogram have been explained including risks of esophageal damage, perforation (1:10,000 risk), bleeding, pharyngeal hematoma as well as other potential complications associated with conscious sedation including aspiration, arrhythmia, respiratory failure and death. Alternatives to treatment were discussed, questions were answered. Patient is willing to proceed.   Otto Felkins David Stall, PA-C  04/13/2020 3:50 PM

## 2020-04-14 ENCOUNTER — Inpatient Hospital Stay (HOSPITAL_COMMUNITY): Payer: Self-pay

## 2020-04-14 ENCOUNTER — Inpatient Hospital Stay (HOSPITAL_COMMUNITY)
Admit: 2020-04-14 | Discharge: 2020-04-14 | Disposition: A | Discharge status: Home / Self Care | Payer: Self-pay | Attending: Medical | Admitting: Medical

## 2020-04-14 ENCOUNTER — Encounter (HOSPITAL_COMMUNITY)
Admission: EM | Disposition: A | Discharge status: Home / Self Care | Payer: Self-pay | Source: Home / Self Care | Attending: Internal Medicine

## 2020-04-14 ENCOUNTER — Encounter (HOSPITAL_COMMUNITY): Payer: Self-pay | Admitting: Internal Medicine

## 2020-04-14 ENCOUNTER — Inpatient Hospital Stay (HOSPITAL_COMMUNITY): Payer: Self-pay | Admitting: Certified Registered Nurse Anesthetist

## 2020-04-14 ENCOUNTER — Inpatient Hospital Stay: Payer: Self-pay

## 2020-04-14 DIAGNOSIS — U071 COVID-19: Secondary | ICD-10-CM

## 2020-04-14 DIAGNOSIS — I330 Acute and subacute infective endocarditis: Secondary | ICD-10-CM

## 2020-04-14 DIAGNOSIS — F11288 Opioid dependence with other opioid-induced disorder: Secondary | ICD-10-CM

## 2020-04-14 DIAGNOSIS — R5082 Postprocedural fever: Secondary | ICD-10-CM

## 2020-04-14 DIAGNOSIS — L02413 Cutaneous abscess of right upper limb: Principal | ICD-10-CM

## 2020-04-14 DIAGNOSIS — I079 Rheumatic tricuspid valve disease, unspecified: Secondary | ICD-10-CM

## 2020-04-14 DIAGNOSIS — B1920 Unspecified viral hepatitis C without hepatic coma: Secondary | ICD-10-CM

## 2020-04-14 HISTORY — PX: TEE WITHOUT CARDIOVERSION: SHX5443

## 2020-04-14 LAB — BASIC METABOLIC PANEL
Anion gap: 9 (ref 5–15)
Anion gap: 11 (ref 5–15)
BUN: 8 mg/dL (ref 6–20)
BUN: 10 mg/dL (ref 6–20)
CO2: 23 mmol/L (ref 22–32)
CO2: 21 mmol/L — ABNORMAL LOW (ref 22–32)
Calcium: 8.4 mg/dL — ABNORMAL LOW (ref 8.9–10.3)
Calcium: 8.7 mg/dL — ABNORMAL LOW (ref 8.9–10.3)
Chloride: 101 mmol/L (ref 98–111)
Chloride: 99 mmol/L (ref 98–111)
Creatinine, Ser: 0.89 mg/dL (ref 0.61–1.24)
Creatinine, Ser: 0.86 mg/dL (ref 0.61–1.24)
GFR calc Af Amer: >60 mL/min (ref >60)
GFR calc Af Amer: >60 mL/min (ref >60)
GFR calc non Af Amer: >60 mL/min (ref >60)
GFR calc non Af Amer: >60 mL/min (ref >60)
Glucose, Bld: 126 mg/dL — ABNORMAL HIGH (ref 70–99)
Glucose, Bld: 153 mg/dL — ABNORMAL HIGH (ref 70–99)
Potassium: 3.9 mmol/L (ref 3.5–5.1)
Potassium: 3.9 mmol/L (ref 3.5–5.1)
Sodium: 133 mmol/L — ABNORMAL LOW (ref 135–145)
Sodium: 131 mmol/L — ABNORMAL LOW (ref 135–145)

## 2020-04-14 LAB — CBC WITH DIFFERENTIAL/PLATELET
Abs Immature Granulocytes: 0.07 10*3/uL (ref 0.00–0.07)
Basophils Absolute: 0.1 10*3/uL (ref 0.0–0.1)
Basophils Relative: 0 %
Eosinophils Absolute: 0.1 10*3/uL (ref 0.0–0.5)
Eosinophils Relative: 1 %
HCT: 34.3 % — ABNORMAL LOW (ref 39.0–52.0)
Hemoglobin: 11 g/dL — ABNORMAL LOW (ref 13.0–17.0)
Immature Granulocytes: 1 %
Lymphocytes Relative: 10 %
Lymphs Abs: 1.3 10*3/uL (ref 0.7–4.0)
MCH: 25.6 pg — ABNORMAL LOW (ref 26.0–34.0)
MCHC: 32.1 g/dL (ref 30.0–36.0)
MCV: 80 fL (ref 80.0–100.0)
Monocytes Absolute: 1.2 10*3/uL — ABNORMAL HIGH (ref 0.1–1.0)
Monocytes Relative: 9 %
Neutro Abs: 10.5 10*3/uL — ABNORMAL HIGH (ref 1.7–7.7)
Neutrophils Relative %: 79 %
Platelets: 211 10*3/uL (ref 150–400)
RBC: 4.29 MIL/uL (ref 4.22–5.81)
RDW: 14 % (ref 11.5–15.5)
WBC: 13.2 10*3/uL — ABNORMAL HIGH (ref 4.0–10.5)
nRBC: 0 % (ref 0.0–0.2)

## 2020-04-14 SURGERY — ECHOCARDIOGRAM, TRANSESOPHAGEAL
Anesthesia: Monitor Anesthesia Care

## 2020-04-14 MED ORDER — LACTATED RINGERS IV SOLN: INTRAVENOUS | Status: DC

## 2020-04-14 MED ORDER — PROPOFOL 10 MG/ML IV BOLUS
INTRAVENOUS | Status: DC | PRN
  Administered 2020-04-14 (×3): 20 mg via INTRAVENOUS

## 2020-04-14 MED ORDER — ONDANSETRON HCL 4 MG/2ML IJ SOLN
4.0000 mg | Freq: Four times a day (QID) | INTRAMUSCULAR | Status: DC | PRN
  Administered 2020-04-14: 4 mg via INTRAVENOUS
  Filled 2020-04-14: qty 2

## 2020-04-14 MED ORDER — IBUPROFEN 200 MG PO TABS: 200.0000 mg | ORAL_TABLET | Freq: Four times a day (QID) | ORAL | Status: DC | PRN

## 2020-04-14 MED ORDER — PROPOFOL 500 MG/50ML IV EMUL
INTRAVENOUS | Status: DC | PRN
  Administered 2020-04-14: 128 ug/kg/min via INTRAVENOUS

## 2020-04-14 MED ORDER — BUTAMBEN-TETRACAINE-BENZOCAINE 2-2-14 % EX AERO
INHALATION_SPRAY | CUTANEOUS | Status: DC | PRN
  Administered 2020-04-14: 2 via TOPICAL

## 2020-04-14 MED ORDER — LACTATED RINGERS IV BOLUS
1000.0000 mL | Freq: Once | INTRAVENOUS | Status: AC
  Administered 2020-04-14: 1000 mL via INTRAVENOUS

## 2020-04-14 MED ORDER — DEXMEDETOMIDINE (PRECEDEX) IN NS 20 MCG/5ML (4 MCG/ML) IV SYRINGE
PREFILLED_SYRINGE | INTRAVENOUS | Status: DC | PRN
  Administered 2020-04-14: 12 ug via INTRAVENOUS
  Administered 2020-04-14: 8 ug via INTRAVENOUS

## 2020-04-14 MED ORDER — KETOROLAC TROMETHAMINE 30 MG/ML IJ SOLN
30.0000 mg | Freq: Once | INTRAMUSCULAR | Status: AC
  Administered 2020-04-14: 30 mg via INTRAVENOUS
  Filled 2020-04-14: qty 1

## 2020-04-14 MED ORDER — ACETAMINOPHEN 325 MG PO TABS
650.0000 mg | ORAL_TABLET | ORAL | Status: DC | PRN
  Filled 2020-04-14: qty 2

## 2020-04-14 MED ORDER — SODIUM CHLORIDE 0.9 % IV SOLN: INTRAVENOUS | Status: DC

## 2020-04-14 MED ORDER — LACTATED RINGERS IV SOLN
INTRAVENOUS | Status: DC | PRN
Start: 2020-04-14 — End: 2020-04-14

## 2020-04-14 NOTE — CV Procedure (Addendum)
    TRANSESOPHAGEAL ECHOCARDIOGRAM   NAME:  Jon Castro   MRN: [id number] DOB:  1985/10/09   ADMIT DATE: 03/31/2020  INDICATIONS: Bacteremia, recent Covid positive  PROCEDURE:   Informed consent was obtained prior to the procedure. The risks, benefits and alternatives for the procedure were discussed and the patient comprehended these risks.  Risks include, but are not limited to, cough, sore throat, vomiting, nausea, somnolence, esophageal and stomach trauma or perforation, bleeding, low blood pressure, aspiration, pneumonia, infection, trauma to the teeth and death.    Procedural time out performed. The oropharynx was anesthetized with topical 1% cetacaine.    Patient received monitored anesthesia care under the supervision of Dr. Donavan Foil. Patient received 20 mcg precedex and 550 mg of propofol.  The transesophageal probe was inserted in the esophagus and stomach without difficulty and multiple views were obtained.    COMPLICATIONS:    There were no immediate complications.  FINDINGS:  LEFT VENTRICLE: EF = 65-70%.  No regional wall motion abnormalities.  RIGHT VENTRICLE: Normal size and function.   LEFT ATRIUM: No thrombus/mass.  LEFT ATRIAL APPENDAGE: No thrombus/mass.   RIGHT ATRIUM: No thrombus/mass.  AORTIC VALVE:  Trileaflet. No regurgitation. No vegetation.  MITRAL VALVE:    Normal structure. Trivial regurgitation. No vegetation.  TRICUSPID VALVE: Normal structure. Trivial regurgitation. There is a linear mobile echodensity attached to the RV side of the TV leaflet, consistent with endocarditis.  PULMONIC VALVE: Grossly normal structure. Trivial regurgitation. No apparent vegetation.  INTERATRIAL SEPTUM: No PFO or ASD seen by color Doppler.  PERICARDIUM: Trivial effusion noted.  DESCENDING AORTA: No significant plaque seen   CONCLUSION: Tricuspid valve endocarditis   Jodelle Red, MD, PhD [facility]  [address],  [address] [place], [place] [address] [phone]   8:55 AM

## 2020-04-14 NOTE — Progress Notes (Signed)
°  Echocardiogram Echocardiogram Transesophageal has been performed.  Gerda Diss 04/14/2020, 9:20 AM

## 2020-04-14 NOTE — Anesthesia Postprocedure Evaluation (Signed)
Anesthesia Post Note  Patient: Jon Castro  Procedure(s) Performed: TRANSESOPHAGEAL ECHOCARDIOGRAM (TEE) (N/A )     Patient location during evaluation: PACU Anesthesia Type: MAC Level of consciousness: awake and alert Pain management: pain level controlled Vital Signs Assessment: post-procedure vital signs reviewed and stable Respiratory status: spontaneous breathing, nonlabored ventilation, respiratory function stable and patient connected to nasal cannula oxygen Cardiovascular status: stable and blood pressure returned to baseline Postop Assessment: no apparent nausea or vomiting Anesthetic complications: no   No complications documented.  Last Vitals:  Vitals:   04/14/20 0910 04/14/20 0915  BP: (!) 122/54 (!) 122/54  Pulse: 73 95  Resp: 18 17  Temp:    SpO2: 100% 94%    Last Pain:  Vitals:   04/14/20 0910  TempSrc:   PainSc: 0-No pain                 Merlinda Frederick

## 2020-04-14 NOTE — Progress Notes (Signed)
Subjective:   No acute events overnight. Yesterday, L forearm IV infiltrated.  This morning, patient had TEE and found to have vegetation on tricuspid valve. His L PIV was reestablished. Per ID, patient will continue IV vancomycin to complete 4-week course. After completion of 4-week course can switch to PO linezolid for 2 weeks.   Patient evaluated at bedside ~11:30am. Found to be shivering, states he is cold. States, "there's nothing wrong, I'm just cold." Reports his chills had just started minutes prior to evaluation. Endorses mild pain at R [facility] site of I&D. Also endorses pain in left forearm. Patient is diaphoretic with unchanged heart and lung exam. L forearm noted to be erythematous, swollen, warm, and firm. Denies headache, chest pain, shortness of breath, N/V, or other complaints. Vitals at that time T 98.9, BP 129/69.   Paged by nurse later in afternoon ~2pm. Patient noted to be febrile to 101.3, BP 101/54 with 95% oxygen saturation on room air. Endorsing HA for which given Tylenol. Work-up as follows: blood cultures x2, CBC, BMP, CXR, urinalysis. Will also obtain LUE ultrasound to evaluate for possible clot/thrombophlebitis.   Objective:  Vital signs in last 24 hours: Vitals:   04/13/20 1711 04/13/20 2049 04/13/20 2050 04/14/20 0414  BP:   (!) 93/57 105/64  Pulse:   75 85  Resp:    14  Temp: 98.6 F (37 C) 98.4 F (36.9 C)  98.8 F (37.1 C)  TempSrc:  Oral  Oral  SpO2:   100% 100%  Weight:      Height:       Physical Exam:  General: uncomfortable-appearing gentleman lying in bed, diaphoretic, shivering, wrapped in several blankets  CV: RRR; 2/6 murmur heard at LUSB  Pulmonary: Normal work of breathing, good air movement bilaterally Ext: moving RUE well with some pain, dressing c/d/i Skin: Left forearm is erythematous, warm to touch, swollen, and firm. New PIV intact.  Assessment/Plan:  Principal Problem:   MRSA bacteremia Active Problems:   Abscess of forearm,  right   IVDU (intravenous drug user)   Opioid use disorder, severe, dependence (HCC)   COVID-19  Mr. Tsang is a 34 year old person who uses IV drugs(heroin), history of MRSA/Candida glabrata bacteremia in 03/2019 (subsequent 3-week hospital stay), major depressive disorder with psychosis, hepatitis C virus (untreated)admitted with 3-day history of swelling of the right forearm with associated pain and erythemaand found to have R antecubital abscess c/b MRSA bacteremia and positive forSARS-CoV-2. This is hospital day #13.  R antecubital abscess c/b MRSA bacteremia Tricuspid valve endocarditis New fever (8/17) S/p I&D byOrtho on 04/01/20.Pain controlled on regimen below. TEE this morning (8/17) showed vegetation on tricuspid valve. Per ID, patient will continue IV vancomycin to complete 4-week course. Today is day 12 of IV vancomycin (day 1: 8/6). After completion of 4-week course can switch to PO linezolid for 2 weeks.  Patient febrile to 101.3 afternoon, endorsing headache. Exam remarkable for LUE warmth, erythema, and swelling around site of previously infiltrated PIV. Concern for infection not covered by vancomycin. Differential includes hospital acquired pneumonia, UTI, thrombophlebitis/upper extremity DVT. Also concern for TEE possibly dislodging vegetation. CBC, BMP, CXR, urinalysis, and LUE ultrasound pending. Left message with staff at Dr. Glenna Durand office to discuss further wound management. - ID following, appreciate recs - Vancomycin Day 12 (last day of 4-week course will be 05/01/20) - f/u CBC, BMP - f/u CXR - f/u U/A - f/u LUE ultrasound  Positive SARS-CoV-2 Present on admission, tested positive on 04/01/20. Patient ended  up developing mild cough and shortness of breath, but did not require treatment. He is now off isolation per protocol.  - Flonase daily  Alphonzo Severance, MD 04/14/2020, 6:21 AM Pager: [phone] After 5pm on weekdays and 1pm on weekends: On Call pager  [phone]

## 2020-04-14 NOTE — Progress Notes (Addendum)
   04/14/20 1552  Assess: MEWS Score  Temp (!) 103.2 F (39.6 C)  BP (!) 110/56  Pulse Rate (!) 125  Resp 18  Level of Consciousness Alert  SpO2 97 %  O2 Device Room Air  Patient Activity (if Appropriate) In chair  Assess: if the MEWS score is Yellow or Red  Were vital signs taken at a resting state? Yes  Focused Assessment Change from prior assessment (see assessment flowsheet)  Early Detection of Sepsis Score *See Row Information* Low  MEWS guidelines implemented *See Row Information* Yes  Treat  MEWS Interventions Escalated (See documentation below)  Take Vital Signs  Increase Vital Sign Frequency  Red: Q 1hr X 4 then Q 4hr X 4, if remains red, continue Q 4hrs  Escalate  MEWS: Escalate Red: discuss with charge nurse/RN and provider, consider discussing with RRT  Notify: Charge Nurse/RN  Name of Charge Nurse/RN Notified Darl Pikes  Date Charge Nurse/RN Notified 04/14/20  Notify: Provider  Provider Name/Title Dr Claudette Laws  Date Provider Notified 04/14/20  Time Provider Notified 1610  Notification Type Call  Notification Reason Change in status  Response See new orders  Date of Provider Response 04/14/20  Time of Provider Response 1615  Notify: Rapid Response  Name of Rapid Response RN Notified  (n/A)  Document  Patient Outcome Other (Comment) (IVF bolus started)  Progress note created (see row info) Yes  Pt is A&O x4, sitting in the chair. Skin is hot. Cold compresses done and ice packs. Dr Claudette Laws notified, IVF bolus started. Will cont to monitor.

## 2020-04-14 NOTE — Progress Notes (Addendum)
0742 Pt is A&O x4, NPO post midnight. To Endo for TEE. 0930 Received pt back from Endo. A&O x4, just c/o right arm pain.

## 2020-04-14 NOTE — Progress Notes (Signed)
   04/14/20 1354  Assess: MEWS Score  Temp (!) 101.3 F (38.5 C) (RN notified)  BP (!) 101/54  Pulse Rate (!) 110  Resp 18  Level of Consciousness Alert  SpO2 95 %  O2 Device Room Air  Assess: MEWS Score  MEWS Temp 1  MEWS Systolic 0  MEWS Pulse 1  MEWS RR 0  MEWS LOC 0  MEWS Score 2  MEWS Score Color Yellow  Assess: if the MEWS score is Yellow or Red  Were vital signs taken at a resting state? Yes  Focused Assessment Change from prior assessment (see assessment flowsheet)  Early Detection of Sepsis Score *See Row Information* Low  MEWS guidelines implemented *See Row Information* Yes  Treat  MEWS Interventions Administered scheduled meds/treatments  Pain Scale 0-10  Pain Score 7  Pain Type Acute pain  Pain Location Head  Pain Descriptors / Indicators Aching  Pain Frequency Intermittent  Pain Onset On-going  Pain Intervention(s) Medication (See eMAR)  Multiple Pain Sites No  Take Vital Signs  Increase Vital Sign Frequency  Yellow: Q 2hr X 2 then Q 4hr X 2, if remains yellow, continue Q 4hrs  Escalate  MEWS: Escalate Yellow: discuss with charge nurse/RN and consider discussing with provider and RRT  Notify: Charge Nurse/RN  Name of Charge Nurse/RN Notified Mia Milan  Date Charge Nurse/RN Notified 04/14/20  Time Charge Nurse/RN Notified 1400  Notify: Provider  Provider Name/Title Dr Claudette Laws  Date Provider Notified 04/14/20  Time Provider Notified 1410  Notification Type Call  Notification Reason Change in status  Response See new orders  Date of Provider Response 04/14/20  Time of Provider Response 1415  Notify: Rapid Response  Name of Rapid Response RN Notified  (N/A)  Document  Patient Outcome Other (Comment) (meds given)  Progress note created (see row info) Yes  Pt is A&O x4, no SOB, no chest pain. Just c/o headache. Encouraged to increase fluids. Pain meds given and Tylenol given for fever. Dr Claudette Laws will come to see pt.

## 2020-04-14 NOTE — Interval H&P Note (Signed)
History and Physical Interval Note:  04/14/2020 8:07 AM  Jon Castro  has presented today for surgery, with the diagnosis of BACTEREMIA ,IV DRUG USE.  The various methods of treatment have been discussed with the patient and family. After consideration of risks, benefits and other options for treatment, the patient has consented to  Procedure(s): TRANSESOPHAGEAL ECHOCARDIOGRAM (TEE) (N/A) as a surgical intervention.  The patient's history has been reviewed, patient examined, no change in status, stable for surgery.  I have reviewed the patient's chart and labs.  Questions were answered to the patient's satisfaction.     [name] Cristal Deer

## 2020-04-14 NOTE — Anesthesia Preprocedure Evaluation (Addendum)
Anesthesia Evaluation  Patient identified by MRN, date of birth, ID band Patient awake    Reviewed: Allergy & Precautions, NPO status , Patient's Chart, lab work & pertinent test results  History of Anesthesia Complications Negative for: history of anesthetic complications  Airway Mallampati: II  TM Distance: >3 FB Neck ROM: Full    Dental  (+) Poor Dentition, Chipped, Missing, Dental Advisory Given   Pulmonary Current Smoker and Patient abstained from smoking.,   Asymptomatic COVID  breath sounds clear to auscultation       Cardiovascular Exercise Tolerance: Good (-) hypertension Rhythm:Regular Rate:Normal  04/29/2019 ECHO: EF 65%, valves OK   Neuro/Psych PSYCHIATRIC DISORDERS Depression negative neurological ROS     GI/Hepatic negative GI ROS, (+)     substance abuse  marijuana use and IV drug use, Hepatitis -, CElevat   Endo/Other  negative endocrine ROS  Renal/GU negative Renal ROS     Musculoskeletal  (+) narcotic dependent  Abdominal   Peds  Hematology  (+) anemia ,   Anesthesia Other Findings COVID + on 8/4 and 8/10 (off isolation protocol)   Reproductive/Obstetrics                          Anesthesia Physical  Anesthesia Plan  ASA: III  Anesthesia Plan: MAC   Post-op Pain Management:    Induction: Intravenous  PONV Risk Score and Plan: 0 and Treatment may vary due to age or medical condition, Propofol infusion and TIVA  Airway Management Planned: Oral ETT  Additional Equipment: None  Intra-op Plan:   Post-operative Plan:   Informed Consent: I have reviewed the patients History and Physical, chart, labs and discussed the procedure including the risks, benefits and alternatives for the proposed anesthesia with the patient or authorized representative who has indicated his/her understanding and acceptance.       Plan Discussed with: CRNA and  Anesthesiologist  Anesthesia Plan Comments: (MAC vs. GETA. )      Anesthesia Quick Evaluation

## 2020-04-14 NOTE — Anesthesia Procedure Notes (Signed)
Procedure Name: MAC Performed by: Jelina Paulsen B, CRNA Pre-anesthesia Checklist: Patient identified, Emergency Drugs available, Suction available, Patient being monitored and Timeout performed Patient Re-evaluated:Patient Re-evaluated prior to induction Oxygen Delivery Method: Nasal cannula Preoxygenation: Pre-oxygenation with 100% oxygen Induction Type: IV induction Airway Equipment and Method: Bite block Placement Confirmation: positive ETCO2 Dental Injury: Teeth and Oropharynx as per pre-operative assessment        

## 2020-04-14 NOTE — Transfer of Care (Signed)
Immediate Anesthesia Transfer of Care Note  Patient: Jon Castro  Procedure(s) Performed: TRANSESOPHAGEAL ECHOCARDIOGRAM (TEE) (N/A )  Patient Location: Endoscopy Unit  Anesthesia Type:MAC  Level of Consciousness: awake, alert  and oriented  Airway & Oxygen Therapy: Patient Spontanous Breathing  Post-op Assessment: Report given to RN and Post -op Vital signs reviewed and stable  Post vital signs: Reviewed and stable  Last Vitals:  Vitals Value Taken Time  BP 108/50 04/14/20 0904  Temp 36.9 C 04/14/20 0904  Pulse 73 04/14/20 0909  Resp 18 04/14/20 0909  SpO2 100 % 04/14/20 0909  Vitals shown include unvalidated device data.  Last Pain:  Vitals:   04/14/20 0904  TempSrc: Oral  PainSc: 0-No pain      Patients Stated Pain Goal: 2 (21/19/41 7408)  Complications: No complications documented.

## 2020-04-14 NOTE — Progress Notes (Signed)
    [facility]    Date of Admission:  03/31/2020   Total days of antibiotics 14           ID: Jon Castro is a 34 y.o. male with MRSA bacteremia, TV endocarditis, and Right AC deep tissue infection Principal Problem:   MRSA bacteremia Active Problems:   Abscess of forearm, right   IVDU (intravenous drug user)   Opioid use disorder, severe, dependence (HCC)   COVID-19    Subjective: Underwent TEE this morning found to have vegetation on TV valve  Medications:  . DULoxetine  30 mg Oral Daily  . fluticasone  2 spray Each Nare Daily  . oxyCODONE  20 mg Oral Q4H  . rivaroxaban  10 mg Oral Daily    Objective: Vital signs in last 24 hours: Temp:  [98.4 F (36.9 C)-98.8 F (37.1 C)] 98.5 F (36.9 C) (08/17 0945) Pulse Rate:  [64-95] 76 (08/17 0945) Resp:  [10-18] 15 (08/17 0945) BP: (93-122)/(50-69) 94/69 (08/17 0945) SpO2:  [94 %-100 %] 100 % (08/17 0945) Weight:  [77 kg] 77 kg (08/17 0758)     Lab Results Recent Labs    04/13/20 0731 04/14/20 0223  WBC 7.5  --   HGB 10.0*  --   HCT 31.5*  --   NA  --  133*  K  --  3.9  CL  --  101  CO2  --  23  BUN  --  8  CREATININE  --  0.89    Microbiology: 8/6 blood cx  Studies/Results: No results found.   Assessment/Plan: MRSA TV endocarditis = plan to continue with vancomycin, planning for 4 wk with 8/6 as day 1.  [facility] Cell: [phone] Pager: [phone]  04/14/2020, 10:40 AM

## 2020-04-15 ENCOUNTER — Inpatient Hospital Stay: Payer: Self-pay

## 2020-04-15 ENCOUNTER — Inpatient Hospital Stay (HOSPITAL_COMMUNITY): Payer: Self-pay

## 2020-04-15 DIAGNOSIS — M79609 Pain in unspecified limb: Secondary | ICD-10-CM

## 2020-04-15 LAB — BASIC METABOLIC PANEL
Anion gap: 7 (ref 5–15)
BUN: 10 mg/dL (ref 6–20)
CO2: 25 mmol/L (ref 22–32)
Calcium: 8.5 mg/dL — ABNORMAL LOW (ref 8.9–10.3)
Chloride: 105 mmol/L (ref 98–111)
Creatinine, Ser: 0.74 mg/dL (ref 0.61–1.24)
GFR calc Af Amer: >60 mL/min (ref >60)
GFR calc non Af Amer: >60 mL/min (ref >60)
Glucose, Bld: 140 mg/dL — ABNORMAL HIGH (ref 70–99)
Potassium: 3.7 mmol/L (ref 3.5–5.1)
Sodium: 137 mmol/L (ref 135–145)

## 2020-04-15 LAB — URINALYSIS, ROUTINE W REFLEX MICROSCOPIC
Bacteria, UA: NONE SEEN
Bilirubin Urine: NEGATIVE
Glucose, UA: NEGATIVE mg/dL
Hgb urine dipstick: NEGATIVE
Ketones, ur: NEGATIVE mg/dL
Nitrite: NEGATIVE
Protein, ur: NEGATIVE mg/dL
Specific Gravity, Urine: 1.009 (ref 1.005–1.030)
pH: 6 (ref 5.0–8.0)

## 2020-04-15 LAB — CBC WITH DIFFERENTIAL/PLATELET
Abs Immature Granulocytes: 0.05 10*3/uL (ref 0.00–0.07)
Basophils Absolute: 0.1 10*3/uL (ref 0.0–0.1)
Basophils Relative: 1 %
Eosinophils Absolute: 0.1 10*3/uL (ref 0.0–0.5)
Eosinophils Relative: 2 %
HCT: 29.8 % — ABNORMAL LOW (ref 39.0–52.0)
Hemoglobin: 9.7 g/dL — ABNORMAL LOW (ref 13.0–17.0)
Immature Granulocytes: 1 %
Lymphocytes Relative: 16 %
Lymphs Abs: 1.2 10*3/uL (ref 0.7–4.0)
MCH: 26.7 pg (ref 26.0–34.0)
MCHC: 32.6 g/dL (ref 30.0–36.0)
MCV: 82.1 fL (ref 80.0–100.0)
Monocytes Absolute: 0.6 10*3/uL (ref 0.1–1.0)
Monocytes Relative: 8 %
Neutro Abs: 5.9 10*3/uL (ref 1.7–7.7)
Neutrophils Relative %: 72 %
Platelets: 153 10*3/uL (ref 150–400)
RBC: 3.63 MIL/uL — ABNORMAL LOW (ref 4.22–5.81)
RDW: 14.4 % (ref 11.5–15.5)
WBC: 8 10*3/uL (ref 4.0–10.5)
nRBC: 0 % (ref 0.0–0.2)

## 2020-04-15 LAB — BLOOD CULTURE ID PANEL (REFLEXED) - BCID2
A.calcoaceticus-baumannii: DETECTED — AB
Bacteroides fragilis: NOT DETECTED
CTX-M ESBL: NOT DETECTED
Candida albicans: NOT DETECTED
Candida auris: NOT DETECTED
Candida glabrata: NOT DETECTED
Candida krusei: NOT DETECTED
Candida parapsilosis: NOT DETECTED
Candida tropicalis: NOT DETECTED
Carbapenem resist OXA 48 LIKE: NOT DETECTED
Carbapenem resistance IMP: NOT DETECTED
Carbapenem resistance KPC: NOT DETECTED
Carbapenem resistance NDM: NOT DETECTED
Carbapenem resistance VIM: NOT DETECTED
Cryptococcus neoformans/gattii: NOT DETECTED
Enterobacter cloacae complex: DETECTED — AB
Enterobacterales: DETECTED — AB
Enterococcus Faecium: NOT DETECTED
Enterococcus faecalis: NOT DETECTED
Escherichia coli: NOT DETECTED
Haemophilus influenzae: NOT DETECTED
Klebsiella aerogenes: NOT DETECTED
Klebsiella oxytoca: NOT DETECTED
Klebsiella pneumoniae: NOT DETECTED
Listeria monocytogenes: NOT DETECTED
Neisseria meningitidis: NOT DETECTED
Proteus species: NOT DETECTED
Pseudomonas aeruginosa: DETECTED — AB
Salmonella species: NOT DETECTED
Serratia marcescens: NOT DETECTED
Staphylococcus aureus (BCID): NOT DETECTED
Staphylococcus epidermidis: NOT DETECTED
Staphylococcus lugdunensis: NOT DETECTED
Staphylococcus species: NOT DETECTED
Stenotrophomonas maltophilia: NOT DETECTED
Streptococcus agalactiae: NOT DETECTED
Streptococcus pneumoniae: NOT DETECTED
Streptococcus pyogenes: NOT DETECTED
Streptococcus species: NOT DETECTED

## 2020-04-15 MED ORDER — CEFEPIME HCL 1 G IJ SOLR
1.0000 g | Freq: Three times a day (TID) | INTRAMUSCULAR | Status: DC
  Filled 2020-04-15 (×2): qty 1

## 2020-04-15 MED ORDER — CEFEPIME HCL 1 G IJ SOLR
1.0000 g | Freq: Three times a day (TID) | INTRAMUSCULAR | Status: DC
  Filled 2020-04-15 (×3): qty 1

## 2020-04-15 MED ORDER — LINEZOLID 600 MG PO TABS
600.0000 mg | ORAL_TABLET | Freq: Two times a day (BID) | ORAL | Status: DC
  Administered 2020-04-15 – 2020-04-22 (×14): 600 mg via ORAL
  Filled 2020-04-15 (×16): qty 1

## 2020-04-15 MED ORDER — ASPIRIN EC 81 MG PO TBEC
81.0000 mg | DELAYED_RELEASE_TABLET | Freq: Every day | ORAL | Status: DC
  Administered 2020-04-15 – 2020-04-22 (×8): 81 mg via ORAL
  Filled 2020-04-15 (×8): qty 1

## 2020-04-15 MED ORDER — SODIUM CHLORIDE 0.9 % IV SOLN
1.0000 g | Freq: Three times a day (TID) | INTRAVENOUS | Status: DC
  Administered 2020-04-15: 1 g via INTRAVENOUS
  Filled 2020-04-15 (×4): qty 1

## 2020-04-15 MED ORDER — SODIUM CHLORIDE 0.9 % IV SOLN
2.0000 g | INTRAVENOUS | Status: DC
  Administered 2020-04-15: 2 g via INTRAVENOUS
  Filled 2020-04-15: qty 20

## 2020-04-15 MED ORDER — CEFEPIME HCL 1 G IJ SOLR
2.0000 g | Freq: Three times a day (TID) | INTRAMUSCULAR | Status: DC
  Filled 2020-04-15 (×2): qty 2

## 2020-04-15 MED ORDER — LEVOFLOXACIN 500 MG PO TABS
750.0000 mg | ORAL_TABLET | Freq: Once | ORAL | Status: AC
  Administered 2020-04-15: 750 mg via ORAL
  Filled 2020-04-15: qty 2

## 2020-04-15 NOTE — Progress Notes (Signed)
PHARMACY - PHYSICIAN COMMUNICATION CRITICAL VALUE ALERT - BLOOD CULTURE IDENTIFICATION (BCID)  Jon Castro is an 34 y.o. male who presented to [facility] on 03/31/2020 with a chief complaint of right arm cellulitis/abscess. During current admission, patient found to have MRSA bacteremia with TV endocarditis with current plan for 4 weeks of vancomycin followed by PO linezolid. Repeat blood cultures from 8/6 were negative; however, patient now growing Enterobacter cloacae, Acinetobacter calcoaceticus-baumannii, and Pseudomonas aeruginosa in blood cultures from his right arm on 8/17. Patient's Tmax 103.2 over last 24 hours with WBC WNL, and patient with complaints of chills yesterday. Patient currently has one PIV in his left arm.   Assessment:  BCx + Acinetobater, Pseudmonas and Enterobacter in 2/4 bottles  Name of physician (or Provider) Contacted: Alphonzo Severance, MD  Current antibiotics: Vancomycin + ceftriaxone  Changes to prescribed antibiotics recommended:  Discussed antimicrobial coverage with ID team Discontinued ceftriaxone  Initiated meropenem   Updated BCID panel from 8/17 BCx has not been added at this time.   Margarite Gouge, PharmD PGY2 ID Pharmacy Resident [phone]  04/15/2020  10:03 AM

## 2020-04-15 NOTE — Progress Notes (Signed)
Subjective: 1 Day Post-Op Procedure(s) (LRB): TRANSESOPHAGEAL ECHOCARDIOGRAM (TEE) (N/A) Patient reports pain as 1 on 0-10 scale.   He is doing well and is sitting comfortably in bed. He denies significant pain in the right arm.  He has been eating and drinking without difficulty.  He denies numbness, tingling, or drainage of the RUE.   Objective: Vital signs in last 24 hours: Temp:  [97.4 F (36.3 C)-98.6 F (37 C)] 97.6 F (36.4 C) (08/18 1500) Pulse Rate:  [59-82] 68 (08/18 1500) Resp:  [14-18] 16 (08/18 1500) BP: (90-104)/(54-64) 92/60 (08/18 1500) SpO2:  [97 %-100 %] 98 % (08/18 1500)  Intake/Output from previous day: 08/17 0701 - 08/18 0700 In: 2705.1 [P.O.:480; I.V.:725; IV Piggyback:1500.1] Out: -  Intake/Output this shift: Total I/O In: 762.9 [P.O.:480; IV Piggyback:282.9] Out: -   Recent Labs    04/13/20 0731 04/14/20 1427 04/15/20 0744  HGB 10.0* 11.0* 9.7*   Recent Labs    04/14/20 1427 04/15/20 0744  WBC 13.2* 8.0  RBC 4.29 3.63*  HCT 34.3* 29.8*  PLT 211 153   Recent Labs    04/14/20 1427 04/15/20 0744  NA 131* 137  K 3.9 3.7  CL 99 105  CO2 21* 25  BUN 10 10  CREATININE 0.86 0.74  GLUCOSE 153* 140*  CALCIUM 8.7* 8.5*   No results for input(s): LABPT, INR in the last 72 hours.  A&O x 3 Intact pulses distally Incision: dressing C/D/I and no drainage  Full flexion, extension, pronation, and supination of the elbow.  Able to make a full fist, cross fingers, and abduct thumb.  Sensation intact to light touch distally.  Capillary refill less than 2 seconds.    Assessment/Plan: 1 Day Post-Op Procedure(s) (LRB): TRANSESOPHAGEAL ECHOCARDIOGRAM (TEE) (N/A)  Right forearm abscess: -Sutures were removed except for one at mid-incision where the skin is still healing. -New bandage was placed over the wound. Continue to keep this on and keep it clean and dry.  -Continue with activity as tolerated for the RUE.  -Follow up as an outpatient for  wound check. Will return to assess the wound if he is still an inpatient next week.   Karma Greaser 04/15/2020, 6:15 PM

## 2020-04-15 NOTE — Progress Notes (Signed)
Second person from IV team came to attempt peripheral IV access but ultrasound results came back and pt has superficial vein thrombosis, so unable to put IV in left arm. Pt with no current IV access. MD and pharmacy aware. Pt unable to receive IV antibiotics this shift.

## 2020-04-15 NOTE — Progress Notes (Signed)
Left upper extremity venous duplex has been completed. Preliminary results can be found in CV Proc through chart review.  Results were given to the patient's nurse, Marcelino Duster.  04/15/20 4:40 PM Olen Cordial RVT

## 2020-04-15 NOTE — Progress Notes (Signed)
Subjective:   Later yesterday afternoon paged by nurse who reported patient febrile to 103.2, BP 110/56, P 125, oxygen saturation 97% on room air. Complaining of headache and nausea. Evaluated by our attending physician at bedside. Given Toradol x1, Zofran, IVF, and ibuprofen made available as PRN.  No acute events overnight.  Evaluated at bedside this morning. He is feeling better. Headache has resolved. Left arm is improved from yesterday. No chills overnight. Right arm pain is well managed on current pain regimen. Appetite and PO intake are back to normal.  Discussed that he will need to remain here in the hospital for an additional 2 weeks for antibiotics.   Objective:  Vital signs in last 24 hours: Vitals:   04/14/20 1903 04/14/20 2045 04/15/20 0018 04/15/20 0428  BP: 90/60 (!) 93/59 104/64 (!) 95/58  Pulse: 75 63 (!) 59 65  Resp: 18 14 14 16   Temp: 97.7 F (36.5 C) 97.6 F (36.4 C) (!) 97.4 F (36.3 C) 98.2 F (36.8 C)  TempSrc: Oral Oral Oral Oral  SpO2: 99% 100% 99% 99%  Weight:      Height:       Physical Exam:  General: Appears more comfortable this morning, brighter, states, "yesterday was awful."  CV: RRR; 2/6 murmur heard at LUSB  Pulmonary: Normal work of breathing, good air movement bilaterally Ext: moving RUE well with some pain, dressing c/d/i Skin: Left forearm slightly erythematous however much less warm to touch and softer. New PIV intact.  Assessment/Plan:  Principal Problem:   MRSA bacteremia Active Problems:   Abscess of forearm, right   IVDU (intravenous drug user)   Opioid use disorder, severe, dependence (HCC)   COVID-19   Endocarditis of tricuspid valve   Postprocedural fever  Mr. Nuon is a 34 year old person who uses IV drugs(heroin), history of MRSA/Candida glabrata bacteremia in 03/2019 (subsequent 3-week hospital stay), major depressive disorder with psychosis, hepatitis C virus (untreated)admitted with 3-day history of swelling of  the right forearm with associated pain and erythemaand found to have R antecubital abscess c/b MRSA bacteremia and positive forSARS-CoV-2. This is hospital day #14.  R antecubital abscess c/b polymicrobial bacteremia Tricuspid valve endocarditis  S/p I&D byOrtho on 04/01/20.Pain controlled on regimen below. TEE yesterday (8/17) showed vegetation on tricuspid valve. Per ID, patient will continue IV vancomycin to complete 4-week course. Today is day 13 of IV vancomycin (day 1: 8/6). After completion of 4-week course can switch to PO linezolid for 2 weeks.  Repeat blood cultures after patient spiked a fever on 8/17 positive for Gram negative species A.calcoaceticus-baumannii, Enterobacterales, Enterobacter cloacae complex, and Pseudomonas aeruginosa. CXR, urinalysis unremarkable. LUE ultrasound results below. Given that patient has lost PIV access and cannot obtain PICC until 48 hrs after treating positive blood culture, will treat IM cefepime and PO Zyvox per pharmacy. Interestingly, patient has had improved fever curve and WBC this morning down to 8 from 13.2 yesterday pre-initiation of coverage for gram negative species. Erythema and swelling of left forearm improved.  - ID following, appreciate recs - Vancomycin Day 13 (last day of 4-week course will be 05/01/20) --> PO Zyvox until PICC or PIV access initiated - Start IM cefepime to cover gram negative bacteremia with plan to switch to IV meropenem when have access - 8/17 repeat blood cultures susceptibilites pending - f/u with ortho to ensure I&D surgical site has been evaluated - PICC to be placed 48 hours after initiating treatment for repeat cultures - Oxycodone 20 mg q4h, Tylenol  650 mg q4h PRN, ibuprofen 200 mg q6h PRN  Acute superficial vein thrombosis involving the left cephalic vein Found on LUE ultrasound. - Warm compresses  Positive SARS-CoV-2 Present on admission, tested positive on 04/01/20. Patient ended up developing mild cough and  shortness of breath, but did not require treatment. He is now off isolation per protocol.  - Flonase daily  Alphonzo Severance, MD 04/15/2020, 6:04 AM Pager: [phone] After 5pm on weekdays and 1pm on weekends: On Call pager [phone]

## 2020-04-15 NOTE — Progress Notes (Signed)
[facility]    Date of Admission:  03/31/2020     ID: Jon Castro is a 34 y.o. male with  Principal Problem:   MRSA bacteremia Active Problems:   Abscess of forearm, right   IVDU (intravenous drug user)   Opioid use disorder, severe, dependence (HCC)   COVID-19   Endocarditis of tricuspid valve   Postprocedural fever    Subjective: He reports feeling better   Medications:  . aspirin EC  81 mg Oral Daily  . DULoxetine  30 mg Oral Daily  . fluticasone  2 spray Each Nare Daily  . linezolid  600 mg Oral Q12H  . oxyCODONE  20 mg Oral Q4H  . rivaroxaban  10 mg Oral Daily    Objective: Vital signs in last 24 hours: Temp:  [97.4 F (36.3 C)-98.6 F (37 C)] 97.6 F (36.4 C) (08/18 1500) Pulse Rate:  [59-82] 68 (08/18 1500) Resp:  [14-18] 16 (08/18 1500) BP: (90-104)/(54-64) 92/60 (08/18 1500) SpO2:  [97 %-100 %] 98 % (08/18 1500)  In the bathroom  Lab Results Recent Labs    04/14/20 1427 04/15/20 0744  WBC 13.2* 8.0  HGB 11.0* 9.7*  HCT 34.3* 29.8*  NA 131* 137  K 3.9 3.7  CL 99 105  CO2 21* 25  BUN 10 10  CREATININE 0.86 0.74   Liver Panel No results for input(s): PROT, ALBUMIN, AST, ALT, ALKPHOS, BILITOT, BILIDIR, IBILI in the last 72 hours. Sedimentation Rate No results for input(s): ESRSEDRATE in the last 72 hours. C-Reactive Protein No results for input(s): CRP in the last 72 hours.  Microbiology: GNR isolated on blood cx Studies/Results: DG Chest Port 1 View  Result Date: 04/14/2020 CLINICAL DATA:  34 year old male with fever and positive COVID-19. Chest pain and shortness of breath. EXAM: PORTABLE CHEST 1 VIEW COMPARISON:  Chest CTA 04/01/2020 and earlier. FINDINGS: Portable AP semi upright view at 1501 hours. Lung volumes and mediastinal contours remain within normal limits. Visualized tracheal air column is within normal limits. No pneumothorax or pleural effusion. No definite acute pulmonary opacity. No acute  osseous abnormality identified. IMPRESSION: Negative portable chest. Electronically Signed   By: Odessa Fleming M.D.   On: 04/14/2020 16:41   ECHO TEE  Result Date: 04/14/2020    TRANSESOPHOGEAL ECHO REPORT   Patient Name:   Jon Castro Date of Exam: 04/14/2020 Medical Rec #:  [id number]            Height:       73.0 in Accession #:    [id number]           Weight:       169.8 lb Date of Birth:  [birth year]            BSA:          2.007 m Patient Age:    34 years             BP:           108/67 mmHg Patient Gender: M                    HR:           76 bpm. Exam Location:  Inpatient Procedure: Transesophageal Echo and Color Doppler Indications:     Bacteremia  History:         Patient has prior history of Echocardiogram examinations, most  recent 04/02/2020. IVDU. Bacteremia. H/O COVID-19.  Sonographer:     Ross Ludwig RDCS (AE) Referring Phys:  [id number] Francee Nodal FURTH Diagnosing Phys: Jodelle Red MD PROCEDURE: After discussion of the risks and benefits of a TEE, an informed consent was obtained from the patient. The transesophogeal probe was passed without difficulty through the esophogus of the patient. Local oropharyngeal anesthetic was provided with Cetacaine. Sedation performed by different physician. The patient was monitored while under deep sedation. Anesthestetic sedation was provided intravenously by Anesthesiology: 550mg  of Propofol. Image quality was good. The patient's vital signs; including heart rate, blood pressure, and oxygen saturation; remained stable throughout the procedure. The patient developed no complications during the procedure. IMPRESSIONS  1. Left ventricular ejection fraction, by estimation, is 65 to 70%. The left ventricle has normal function. The left ventricle has no regional wall motion abnormalities.  2. Right ventricular systolic function is normal. The right ventricular size is normal.  3. No left atrial/left atrial appendage thrombus was detected.  4.  The mitral valve is normal in structure. Trivial mitral valve regurgitation. No evidence of mitral stenosis.  5. There is a linear mobile echodensity attached to the RV side of the TV leaflet, consistent with endocarditis.. The tricuspid valve is abnormal.  6. The aortic valve is tricuspid. Aortic valve regurgitation is not visualized. No aortic stenosis is present. Conclusion(s)/Recommendation(s): Findings are concerning for vegetation/infective endocarditis as detailed above. There is a linear mobile echodensity attached to the RV side of the TV leaflet. FINDINGS  Left Ventricle: Left ventricular ejection fraction, by estimation, is 65 to 70%. The left ventricle has normal function. The left ventricle has no regional wall motion abnormalities. The left ventricular internal cavity size was normal in size. There is  no left ventricular hypertrophy. Right Ventricle: The right ventricular size is normal. No increase in right ventricular wall thickness. Right ventricular systolic function is normal. Left Atrium: Left atrial size was not assessed. No left atrial/left atrial appendage thrombus was detected. Right Atrium: Right atrial size was not assessed. Pericardium: Trivial pericardial effusion is present. Mitral Valve: The mitral valve is normal in structure. Trivial mitral valve regurgitation. No evidence of mitral valve stenosis. There is no evidence of mitral valve vegetation. Tricuspid Valve: There is a linear mobile echodensity attached to the RV side of the TV leaflet, consistent with endocarditis. The tricuspid valve is abnormal. Tricuspid valve regurgitation is trivial. Aortic Valve: The aortic valve is tricuspid. Aortic valve regurgitation is not visualized. No aortic stenosis is present. There is no evidence of aortic valve vegetation. Pulmonic Valve: The pulmonic valve was grossly normal. Pulmonic valve regurgitation is trivial. No evidence of pulmonic stenosis. There is no evidence of pulmonic valve  vegetation. Aorta: The aortic root is normal in size and structure. IAS/Shunts: No atrial level shunt detected by color flow Doppler. MD Electronically signed by Jodelle Red MD Signature Date/Time: 04/14/2020/5:38:23 PM    Final    VAS 04/16/2020 UPPER EXTREMITY VENOUS DUPLEX  Result Date: 04/15/2020 UPPER VENOUS STUDY  Indications: Pain, and Swelling Risk Factors: IVDU. Limitations: Poor ultrasound/tissue interface. Comparison Study: No prior studies. Performing Technologist: 04/17/2020 RVT  Examination Guidelines: A complete evaluation includes B-mode imaging, spectral Doppler, color Doppler, and power Doppler as needed of all accessible portions of each vessel. Bilateral testing is considered an integral part of a complete examination. Limited examinations for reoccurring indications may be performed as noted.  Right Findings: +----------+------------+---------+-----------+----------+-------+ RIGHT     CompressiblePhasicitySpontaneousPropertiesSummary +----------+------------+---------+-----------+----------+-------+ Subclavian  Full       Yes       Yes                      +----------+------------+---------+-----------+----------+-------+  Left Findings: +----------+------------+---------+-----------+----------+-------+ LEFT      CompressiblePhasicitySpontaneousPropertiesSummary +----------+------------+---------+-----------+----------+-------+ IJV           Full       Yes       Yes                      +----------+------------+---------+-----------+----------+-------+ Subclavian    Full       Yes       Yes                      +----------+------------+---------+-----------+----------+-------+ Axillary      Full       Yes       Yes                      +----------+------------+---------+-----------+----------+-------+ Brachial      Full       Yes       Yes                       +----------+------------+---------+-----------+----------+-------+ Radial        Full                                          +----------+------------+---------+-----------+----------+-------+ Ulnar         Full                                          +----------+------------+---------+-----------+----------+-------+ Cephalic      None                                   Acute  +----------+------------+---------+-----------+----------+-------+ Basilic       Full                                          +----------+------------+---------+-----------+----------+-------+ Thrombus noted in the cephalic vein extends [facility] into the forearm.  Summary:  Right: No evidence of thrombosis in the subclavian.  Left: No evidence of deep vein thrombosis in the upper extremity. Findings consistent with acute superficial vein thrombosis involving the left cephalic vein.  *See table(s) above for measurements and observations.  Diagnosing physician: Gretta Beganodd Early MD Electronically signed by Gretta Beganodd Early MD on 04/15/2020 at 5:01:23 PM.    Final    US EKG SITE RITE  Result Date: 04/15/2020 If Site Rite image not attached, placement could not be confirmed due to current cardiac rhythm.  US EKG SITE RITE  Result Date: 04/14/2020 If Site Rite image not attached, placement could not be confirmed due to current cardiac rhythm.    Assessment/Plan: Polymicrobial GNR bacteremia = unclear source if related to phlebitis. Will start meropenem. Await U/S of left forearm  MRSA bacteremia/TV endocarditis = continue on vancomycin  Right AC deep tissue infection = continue with wound care support  [facility] Cell: [phone] Pager: [phone]  04/15/2020, 6:04 PM

## 2020-04-15 NOTE — Progress Notes (Signed)
IV access lost due to malposition and drainage. IV team consulted for new IV. Unable to gain access. Is going to send a second person to try peripheral IV. MD made aware of issue in case second person unsuccessful.

## 2020-04-15 NOTE — Progress Notes (Addendum)
Pharmacy Antibiotic Note  Ericson Nafziger is a 34 y.o. male admitted on 03/31/2020 with wound infxn and MRSA bacteremia . Patient also found incidentally to be COVID+, now 14 days from initial positive PCR and out of airborne isolation. Pharmacy has been consulted for Vancomycin dosing.  Patient febrile this morning, WBC wnl. Renal function stable.  Endocarditis workup:  Vegetation on TV valve.   Vancomycin planned for 4 weeks with 04/03/20 as day 1.  Meropenem 1 g IV q8h added today for gram neg rods in 04/14/20 (organism ID to follow) blood cultures.  Plan: Continue vancomycin 1250mg  IV q8h Monitor renal function, weekly vancomycin trough on Fridays F/U C&S    Height: 6\' 1"  (185.4 cm) Weight: 77 kg (169 lb 12.1 oz) IBW/kg (Calculated) : 79.9  Temp (24hrs), Avg:97.7 F (36.5 C), Min:97.4 F (36.3 C), Max:98.2 F (36.8 C)  Recent Labs  Lab 04/09/20 0620 04/10/20 0322 04/12/20 0859 04/13/20 0731 04/14/20 0223 04/14/20 1427 04/15/20 0744  WBC  --   --   --  7.5  --  13.2* 8.0  CREATININE 0.86 0.71  --   --  0.89 0.86 0.74  VANCOTROUGH  --   --  18  --   --   --   --     Estimated Creatinine Clearance: 140.4 mL/min (by C-G formula based on SCr of 0.74 mg/dL).    Allergies  Allergen Reactions  . Vicodin [Hydrocodone-Acetaminophen] Itching  . Codeine Nausea And Vomiting  . Codeine   . Tramadol Other (See Comments)    "messes with head"  . Tramadol   . Vicodin [Hydrocodone-Acetaminophen]    Antimicrobial this admission: Vanc 8/4>> Zosyn 8/4>>8/5  Dose Adjustment this admission: 8/6: VT 9: incr 1250mg /8hr 8/9: VT 16: no change 8/15: VT 18: no change  Microbiology this admission: 8/3 BCx: MRSA 8/4 R arm abscess: rare staph aureus, rare grp A strep  8/4 COVID: positive. 8/6 Bcx: NGTD 8/17 BCx: gnr  Melissa P. 10/4, PharmD, BCPS Clinical Pharmacist [place] Please utilize Amion for appropriate phone number to reach the unit pharmacist [facility]  Pharmacy) 04/15/2020 6:33 PM   ADDENDUM #1  Pt has no IV access at the present time, so unable to receive IV vancomycin and meropenem. Discussed with Dr. Casimiro Needle and Dr. [facility] of ID; pt will receive Zyvox 600 mg PO Q 12 hrs plus cefepime 2 gm IM Q 8 hrs until IV access available. Per Dr. 04/17/2020, if pt cannot tolerate IM cefepime, can use levofloxacin or ciprofloxacin PO until IV access available.  Mayford Knife, PharmD, BCPS, [facility] Clinical Pharmacist 04/15/20, 18:36 PM  ADDENDUM #2  Per RN, pt refused cefepime IM dose this evening, but said he will consider receiving cefepime IM dose in the AM. Per discussion with Dr. Vicki Mallet, entered 1-time order for levaquin 750 mg PO. If pt continues to refuse IM cefepime, will enter ongoing order for levaquin 750 mg PO Q 24 hrs until pt regains IV access.  [facility], PharmD, BCPS, [facility] Clinical Pharmacist 04/15/20, 20:15 PM

## 2020-04-15 NOTE — Plan of Care (Signed)

## 2020-04-15 NOTE — Progress Notes (Signed)
Pharmacy Antibiotic Note  Jon Castro is a 34 y.o. male admitted on 03/31/2020 with wound infxn and MRSA bacteremia . Patient also found incidentally to be COVID+, now 14 days from initial positive PCR and out of airborne isolation. Pharmacy has been consulted for Vancomycin dosing.  Patient febrile this morning, WBC wnl. Renal function stable.  Endocarditis workup:  Vegetation on TV valve.   Vancomycin planned for 4 weeks with 04/03/20 as day 1.  Meropenem 1 g IV q8h added today for gram neg rods in 04/14/20 (organism ID to follow) blood cultures.  Plan: Continue vancomycin 1250mg  IV q8h Monitor renal function, weekly vancomycin trough on Fridays F/U C&S    Height: 6\' 1"  (185.4 cm) Weight: 77 kg (169 lb 12.1 oz) IBW/kg (Calculated) : 79.9  Temp (24hrs), Avg:98.8 F (37.1 C), Min:97.4 F (36.3 C), Max:103.2 F (39.6 C)  Recent Labs  Lab 04/09/20 0620 04/10/20 0322 04/12/20 0859 04/13/20 0731 04/14/20 0223 04/14/20 1427 04/15/20 0744  WBC  --   --   --  7.5  --  13.2* 8.0  CREATININE 0.86 0.71  --   --  0.89 0.86 0.74  VANCOTROUGH  --   --  18  --   --   --   --     Estimated Creatinine Clearance: 140.4 mL/min (by C-G formula based on SCr of 0.74 mg/dL).    Allergies  Allergen Reactions  . Vicodin [Hydrocodone-Acetaminophen] Itching  . Codeine Nausea And Vomiting  . Codeine   . Tramadol Other (See Comments)    "messes with head"  . Tramadol   . Vicodin [Hydrocodone-Acetaminophen]    Antimicrobial this admission: Vanc 8/4>> Zosyn 8/4>>8/5  Dose Adjustment this admission: 8/6: VT 9: incr 1250mg /8hr 8/9: VT 16: no change 8/15: VT 18: no change  Microbiology this admission: 8/3 BCx: MRSA 8/4 R arm abscess: rare staph aureus, rare grp A strep  8/4 COVID: positive. 8/6 Bcx: NGTD 8/17 BCx: gnr  Fleta Borgeson P. 10/4, PharmD, BCPS Clinical Pharmacist  Please utilize Amion for appropriate phone number to reach the unit pharmacist [facility]  Pharmacy) 04/15/2020 10:18 AM

## 2020-04-16 ENCOUNTER — Encounter (HOSPITAL_COMMUNITY): Payer: Self-pay | Admitting: Cardiology

## 2020-04-16 DIAGNOSIS — F1124 Opioid dependence with opioid-induced mood disorder: Secondary | ICD-10-CM

## 2020-04-16 DIAGNOSIS — I82612 Acute embolism and thrombosis of superficial veins of left upper extremity: Secondary | ICD-10-CM

## 2020-04-16 LAB — BASIC METABOLIC PANEL
Anion gap: 7 (ref 5–15)
BUN: 11 mg/dL (ref 6–20)
CO2: 26 mmol/L (ref 22–32)
Calcium: 8.4 mg/dL — ABNORMAL LOW (ref 8.9–10.3)
Chloride: 106 mmol/L (ref 98–111)
Creatinine, Ser: 0.85 mg/dL (ref 0.61–1.24)
GFR calc Af Amer: >60 mL/min (ref >60)
GFR calc non Af Amer: >60 mL/min (ref >60)
Glucose, Bld: 116 mg/dL — ABNORMAL HIGH (ref 70–99)
Potassium: 4.2 mmol/L (ref 3.5–5.1)
Sodium: 139 mmol/L (ref 135–145)

## 2020-04-16 LAB — CBC WITH DIFFERENTIAL/PLATELET
Abs Immature Granulocytes: 0.03 10*3/uL (ref 0.00–0.07)
Basophils Absolute: 0 10*3/uL (ref 0.0–0.1)
Basophils Relative: 1 %
Eosinophils Absolute: 0.2 10*3/uL (ref 0.0–0.5)
Eosinophils Relative: 3 %
HCT: 29.5 % — ABNORMAL LOW (ref 39.0–52.0)
Hemoglobin: 9.2 g/dL — ABNORMAL LOW (ref 13.0–17.0)
Immature Granulocytes: 1 %
Lymphocytes Relative: 24 %
Lymphs Abs: 1.3 10*3/uL (ref 0.7–4.0)
MCH: 25.8 pg — ABNORMAL LOW (ref 26.0–34.0)
MCHC: 31.2 g/dL (ref 30.0–36.0)
MCV: 82.9 fL (ref 80.0–100.0)
Monocytes Absolute: 0.7 10*3/uL (ref 0.1–1.0)
Monocytes Relative: 13 %
Neutro Abs: 3.2 10*3/uL (ref 1.7–7.7)
Neutrophils Relative %: 58 %
Platelets: 179 10*3/uL (ref 150–400)
RBC: 3.56 MIL/uL — ABNORMAL LOW (ref 4.22–5.81)
RDW: 14.6 % (ref 11.5–15.5)
WBC: 5.5 10*3/uL (ref 4.0–10.5)
nRBC: 0 % (ref 0.0–0.2)

## 2020-04-16 MED ORDER — LEVOFLOXACIN 500 MG PO TABS
750.0000 mg | ORAL_TABLET | Freq: Every day | ORAL | Status: DC
  Administered 2020-04-16 – 2020-04-21 (×6): 750 mg via ORAL
  Filled 2020-04-16 (×6): qty 2

## 2020-04-16 NOTE — Progress Notes (Signed)
Subjective:   No acute events overnight. Ortho came by and removed all of his sutures except one from surgical site. Pain well controlled. No further fever or chills. Denies shortness of breath, cough, chest pain. Eating well and is having normal BMs. Explained the need for new antibiotics based on repeat cultures. Having to give oral antibiotics until he is able to get a PICC line.   Objective:  Vital signs in last 24 hours: Vitals:   04/15/20 1932 04/16/20 0353 04/16/20 0900 04/16/20 1459  BP: 95/69 90/63 92/64  113/68  Pulse: 72 62 63 61  Resp: 16 16  17   Temp: 98.2 F (36.8 C) (!) 97.2 F (36.2 C) (!) 97.1 F (36.2 C) 98 F (36.7 C)  TempSrc: Oral Oral Oral Oral  SpO2: 100% 98% 98% 100%  Weight:      Height:       Physical Exam:  General: Appears comfortable this morning, in no acute distress CV: RRR; 2/6 murmur heard at LUSB  Pulmonary: Normal work of breathing, good air movement bilaterally Ext: moving RUE well with some pain, dressing c/d/i Skin: Left forearm slightly erythematous with normal temperature, mild tenderness to palpation   Assessment/Plan:  Principal Problem:   MRSA bacteremia Active Problems:   Abscess of forearm, right   IVDU (intravenous drug user)   Opioid use disorder, severe, dependence (HCC)   COVID-19   Endocarditis of tricuspid valve   Postprocedural fever  Jon Castro is a 34 year old person who uses IV drugs(heroin), history of MRSA/Candida glabrata bacteremia in 03/2019 (subsequent 3-week hospital stay), major depressive disorder with psychosis, hepatitis C virus (untreated)admitted with 3-day history of swelling of the right forearm with associated pain and erythemaand found to have R antecubital abscess c/b MRSA bacteremia and positive forSARS-CoV-2. Found to have bacterial endocarditis. Now with polymicrobial gram negative bacteremia. This is hospital day 15.  R antecubital abscess c/b polymicrobial bacteremia Tricuspid valve  endocarditis S/p I&D byOrtho on 04/01/20.Pain controlled on regimen below. TEE on 8/17 showed vegetation on tricuspid valve. Per ID, patient will continue IV vancomycin to complete 4-week course. Patient completed 13 days of IV vancomycin, switched yesterday to PO linezolid (Zyvox) until access can be established (day 1: 8/6). Per IV team, central tunneled catheter may be preferred given L arm thrombophlebitis and R arm surgical site. Once access is established, will switch back to IV vancomycin. After completion of 4-week course can switch to PO linezolid for 2 weeks. Ortho removed all but one suture from R [facility] surgical site yesterday (8/18). - ID following, appreciate recs - s/p 13 days IV Vancomycin --> PO Zyvox until PICC access initiated (last day of 4-week MRSA coverage course will be 05/01/20) - R AC bandage changed 8/18. Per ortho, continue this and keep it clean and dry. Ortho will return to assess the wound in a week. Patient will need follow-up with ortho as an outpatient.  - Oxycodone 20 mg q4h, Tylenol 650 mg q4h PRN, ibuprofen 200 mg q6h PRN - IV access (likely central tunneled catheter) tomorrow (8/20) if he remains fever free the next 24h  Polymicrobial gram negative bacteremia 8/17 repeat blood cultures positive for Gram negative species A.calcoaceticus-baumannii, Enterobacterales, Enterobacter cloacae complex, and Pseudomonas aeruginosa. CXR, urinalysis unremarkable. Suspect acquired during hospitalization related to superficial vein thrombophlebitis from infiltrated IV site. Since unable to establish IV access, will continue PO Levaquin, likely for 7-day course. Afebrile since initial fever on 8/17, and WBC downtrending 5.5<8.0<13.2. Erythema and swelling of left forearm continues  to improve. Per IV team, central tunneled catheter may be preferred given L arm thrombophlebitis and R arm surgical site. - ID following, appreciate recs - Continue PO Levaquin (day 1: 8/18) - IV access (likely  central tunneled catheter) tomorrow (8/20) if he remains fever free the next 24h  Acute superficial vein thrombosis involving the left cephalic vein Found on LUE ultrasound. - Warm compresses - ASA 81 mg  Positive SARS-CoV-2 Present on admission, tested positive on 04/01/20. Patient ended up developing mild cough and shortness of breath, but did not require treatment. He is now off isolation per protocol.  - Flonase daily  Jon Severance, MD 04/16/2020, 3:13 PM Pager: [phone] After 5pm on weekdays and 1pm on weekends: On Call pager [phone]

## 2020-04-16 NOTE — Progress Notes (Addendum)
[facility]    Date of Admission:  03/31/2020     ID: Jon Castro is a 34 y.o. male with  Principal Problem:   MRSA bacteremia Active Problems:   Abscess of forearm, right   IVDU (intravenous drug user)   Opioid use disorder, severe, dependence (HCC)   COVID-19   Endocarditis of tricuspid valve   Postprocedural fever    Subjective: He reports feeling better   Medications:  . aspirin EC  81 mg Oral Daily  . DULoxetine  30 mg Oral Daily  . fluticasone  2 spray Each Nare Daily  . levofloxacin  750 mg Oral Daily  . linezolid  600 mg Oral Q12H  . oxyCODONE  20 mg Oral Q4H  . rivaroxaban  10 mg Oral Daily    Objective: Vital signs in last 24 hours: Temp:  [97.1 F (36.2 C)-98.2 F (36.8 C)] 97.1 F (36.2 C) (08/19 0900) Pulse Rate:  [62-72] 63 (08/19 0900) Resp:  [16] 16 (08/19 0353) BP: (90-95)/(60-69) 92/64 (08/19 0900) SpO2:  [98 %-100 %] 98 % (08/19 0900)  In the bathroom  Lab Results Recent Labs    04/15/20 0744 04/16/20 1106  WBC 8.0 5.5  HGB 9.7* 9.2*  HCT 29.8* 29.5*  NA 137 139  K 3.7 4.2  CL 105 106  CO2 25 26  BUN 10 11  CREATININE 0.74 0.85   Liver Panel No results for input(s): PROT, ALBUMIN, AST, ALT, ALKPHOS, BILITOT, BILIDIR, IBILI in the last 72 hours. Sedimentation Rate No results for input(s): ESRSEDRATE in the last 72 hours. C-Reactive Protein No results for input(s): CRP in the last 72 hours.  Microbiology: GNR isolated on blood cx Studies/Results: DG Chest Port 1 View  Result Date: 04/14/2020 CLINICAL DATA:  34 year old male with fever and positive COVID-19. Chest pain and shortness of breath. EXAM: PORTABLE CHEST 1 VIEW COMPARISON:  Chest CTA 04/01/2020 and earlier. FINDINGS: Portable AP semi upright view at 1501 hours. Lung volumes and mediastinal contours remain within normal limits. Visualized tracheal air column is within normal limits. No pneumothorax or pleural effusion. No definite  acute pulmonary opacity. No acute osseous abnormality identified. IMPRESSION: Negative portable chest. Electronically Signed   By: Odessa Fleming M.D.   On: 04/14/2020 16:41   VAS [place] UPPER EXTREMITY VENOUS DUPLEX  Result Date: 04/15/2020 UPPER VENOUS STUDY  Indications: Pain, and Swelling Risk Factors: IVDU. Limitations: Poor ultrasound/tissue interface. Comparison Study: No prior studies. Performing Technologist: Chanda Busing RVT  Examination Guidelines: A complete evaluation includes B-mode imaging, spectral Doppler, color Doppler, and power Doppler as needed of all accessible portions of each vessel. Bilateral testing is considered an integral part of a complete examination. Limited examinations for reoccurring indications may be performed as noted.  Right Findings: +----------+------------+---------+-----------+----------+-------+ RIGHT     CompressiblePhasicitySpontaneousPropertiesSummary +----------+------------+---------+-----------+----------+-------+ Subclavian    Full       Yes       Yes                      +----------+------------+---------+-----------+----------+-------+  Left Findings: +----------+------------+---------+-----------+----------+-------+ LEFT      CompressiblePhasicitySpontaneousPropertiesSummary +----------+------------+---------+-----------+----------+-------+ IJV           Full       Yes       Yes                      +----------+------------+---------+-----------+----------+-------+ Subclavian    Full  Yes       Yes                      +----------+------------+---------+-----------+----------+-------+ Axillary      Full       Yes       Yes                      +----------+------------+---------+-----------+----------+-------+ Brachial      Full       Yes       Yes                      +----------+------------+---------+-----------+----------+-------+ Radial        Full                                           +----------+------------+---------+-----------+----------+-------+ Ulnar         Full                                          +----------+------------+---------+-----------+----------+-------+ Cephalic      None                                   Acute  +----------+------------+---------+-----------+----------+-------+ Basilic       Full                                          +----------+------------+---------+-----------+----------+-------+ Thrombus noted in the cephalic vein extends from the [facility] into the forearm.  Summary:  Right: No evidence of thrombosis in the subclavian.  Left: No evidence of deep vein thrombosis in the upper extremity. Findings consistent with acute superficial vein thrombosis involving the left cephalic vein.  *See table(s) above for measurements and observations.  Diagnosing physician: Gretta Began MD Electronically signed by Gretta Began MD on 04/15/2020 at 5:01:23 PM.    Final    [place] EKG SITE RITE  Result Date: 04/15/2020 If Site Rite image not attached, placement could not be confirmed due to current cardiac rhythm.  [place] EKG SITE RITE  Result Date: 04/14/2020 If Site Rite image not attached, placement could not be confirmed due to current cardiac rhythm.    Assessment/Plan:  MRSA bacteremia/TV endocarditis = continue linezolid for now until IV can be re-established.   Polymicrobial GNR bacteremia = Acquired during hospitalization related to superficial vein thrombophlebitis from infiltrated IV site. Unable to establish IV access currently. He has improved rapidly - will continue with Levaquin for now.   Right AC deep tissue infection = continue with wound care support. Ortho has returned to assess site and removed nearly all sutures.   Lost IV access = will ask PICC team if can be placed superior to the right AC incision given the left arm is still quite swollen, erythematous and tender. Would place tomorrow if he remains fever free the next 24h.     Rexene Alberts, MSN, NP-C [facility]  [email or web address] Pager: [phone] Office: [phone] [facility] Main Line: [phone]

## 2020-04-16 NOTE — Plan of Care (Signed)
  Problem: Education: Goal: Knowledge of General Education information will improve Description Including pain rating scale, medication(s)/side effects and non-pharmacologic comfort measures Outcome: Progressing   

## 2020-04-16 NOTE — Progress Notes (Signed)
Pt requesting to go outside, per IM provider it is okay as long as a staff member is present. New orders placed.

## 2020-04-16 NOTE — Progress Notes (Signed)
Notified by AD that pt's who are under contact precautions are not eligible to go outside. Educated pt.

## 2020-04-17 ENCOUNTER — Inpatient Hospital Stay: Payer: Self-pay

## 2020-04-17 DIAGNOSIS — B965 Pseudomonas (aeruginosa) (mallei) (pseudomallei) as the cause of diseases classified elsewhere: Secondary | ICD-10-CM

## 2020-04-17 NOTE — Progress Notes (Signed)
ID Pharmacy Antibiotic Note   Jon Castro is currently on linezolid and levofloxacin while awaiting IV access. A PICC line has been ordered but Jon Castro had his dose of Xarelto this AM so may not be placed until 8/21.   Once central line in place can resume: Vancomycin - Would  resume previous therapeutic regimen of 1250 mg q 8 hours  Meropenem 1 gm every 8 hours   Sharin Mons, PharmD, BCPS, [facility] Infectious Diseases Clinical Pharmacist Phone: [phone] 04/17/2020 3:36 PM

## 2020-04-17 NOTE — Progress Notes (Signed)
   S: Patient feeling okay today.  Pain in his right arm is okay.  Eating and drinking well.  Sleeping comfortably.  O:  Vitals:   04/17/20 0432 04/17/20 0811  BP: 95/65 106/67  Pulse: (!) 57 63  Resp: 17 17  Temp: (!) 97.3 F (36.3 C) (!) 97.5 F (36.4 C)  SpO2: 100% 100%   Gen: Well-appearing man, no distress Neuro: Alert, conversational, not sedated Psych: Normal affect, not anxious or depressed appearing Ext: Right elbow is wrapped in a clean bandage, left upper extremity mildly swollen, lower extremities are normal with no edema Resp: Unlabored breathing  A/P: 34 year old person who injects drugs admitted with tricuspid valve endocarditis due to MRSA, hospital course complicated by right arm skin and soft tissue infection and Pseudomonas bacteremia.  Tricuspid valve endocarditis: Blood cultures positive for MRSA on 8/3.  Started on antibiotics 8/4.  Subsequent blood culture on 8/6 negative for MRSA.  TEE on 8/17 showed a mobile echodensity on the tricuspid valve consistent with endocarditis.  Planning for at least 4 weeks of IV antibiotics in hospital, and then potential to transition to oral antibiotics for 2 weeks at discharge.  Recently had difficulty with IV access, requiring switch from vancomycin to linezolid for now.  We are going to have a IJ central line placed today and get back on vancomycin.  Greatly appreciate ID consultation.  Pseudomonas bacteremia: Febrile on 8/17 and then subsequent blood culture positive for Pseudomonas.  Likely source of infection is the right antecubital fossa skin and soft tissue infection.  This was managed surgically with irrigation debridement on 8/4.  Plan to continue with levofloxacin for around 7 days.  Thankfully his fevers have resolved, leukocytosis resolved.  Xarelto 10 mg daily for DVT prophylaxis.  Regular diet.  Will be eligible for discharge to home when he completes 4 weeks of IV antibiotics on 9/2.  Tyson Alias,  MD 04/17/2020, 1:48 PM

## 2020-04-17 NOTE — Progress Notes (Signed)
   Patient Status: [facility]  Assessment and Plan: Patient in need of venous access.   Tunneled IJ central catheter placement  ______________________________________________________________________   History of Present Illness: Jon Castro is a 34 y.o. male   MRSA bacteremia Abscess rt arm; left arm erythema IVDA Endocarditis  Need for access for IV antibiotics x 2-3 weeks  ID requesting tunneled line for use in house--  Cannot use arms secondary infections  Allergies and medications reviewed.   Review of Systems: A 12 point ROS discussed and pertinent positives are indicated in the HPI above.  All other systems are negative.   Vital Signs: BP 106/67 (BP Location: Left Arm)   Pulse 63   Temp (!) 97.5 F (36.4 C) (Oral)   Resp 17   Ht 6\' 1"  (1.854 m)   Wt 169 lb 12.1 oz (77 kg)   SpO2 100%   BMI 22.40 kg/m   Physical Exam Vitals reviewed.  Musculoskeletal:        General: Normal range of motion.  Neurological:     Mental Status: He is alert and oriented to person, place, and time.  Psychiatric:        Behavior: Behavior normal.      Imaging reviewed.   Labs:  COAGS: Recent Labs    04/27/19 2253 03/31/20 2316  INR 1.1 1.2    BMP: Recent Labs    04/14/20 0223 04/14/20 1427 04/15/20 0744 04/16/20 1106  NA 133* 131* 137 139  K 3.9 3.9 3.7 4.2  CL 101 99 105 106  CO2 23 21* 25 26  GLUCOSE 126* 153* 140* 116*  BUN 8 10 10 11   CALCIUM 8.4* 8.7* 8.5* 8.4*  CREATININE 0.89 0.86 0.74 0.85  GFRNONAA >60 >60 >60 >60  GFRAA >60 >60 >60 >60    Scheduled for Tunn IJ Central catheter placement Pt is aware of procedure benefits and risks Including but not limited to' Infection; bleeding; vessel damage Agreeable to proceed Does not want to go NPO-- ok without sedation -- if need Consent signed and in chart  Electronically Signed: 04/18/20, PA-C 04/17/2020, 12:40 PM   I spent a total of 15 minutes in face to face in clinical  consultation, greater than 50% of which was counseling/coordinating care for venous access.

## 2020-04-17 NOTE — Progress Notes (Addendum)
[facility]    Date of Admission:  03/31/2020   Total days of antibiotics 16          ID: Jon Castro is a 34 y.o. male with   Principal Problem:   MRSA bacteremia Active Problems:   Abscess of forearm, right   IVDU (intravenous drug user)   Opioid use disorder, severe, dependence (HCC)   COVID-19   Endocarditis of tricuspid valve   Postprocedural fever    Subjective: Afebrile, feeling better less swelling in left fore arm  Medications:  . aspirin EC  81 mg Oral Daily  . DULoxetine  30 mg Oral Daily  . fluticasone  2 spray Each Nare Daily  . levofloxacin  750 mg Oral Daily  . linezolid  600 mg Oral Q12H  . oxyCODONE  20 mg Oral Q4H    Objective: Vital signs in last 24 hours: Temp:  [97.3 F (36.3 C)-98.2 F (36.8 C)] 97.6 F (36.4 C) (08/20 1355) Pulse Rate:  [57-80] 80 (08/20 1355) Resp:  [17-18] 17 (08/20 1355) BP: (93-110)/(58-70) 110/70 (08/20 1355) SpO2:  [99 %-100 %] 99 % (08/20 1355) Physical Exam  Constitutional: He is oriented to person, place, and time. He appears well-developed and well-nourished. No distress.  HENT:  Mouth/Throat: Oropharynx is clear and moist. No oropharyngeal exudate.  Cardiovascular: Normal rate, regular rhythm and normal heart sounds. Exam reveals no gallop and no friction rub.  No murmur heard.  Pulmonary/Chest: Effort normal and breath sounds normal. No respiratory distress. He has no wheezes.  Ext: mild left fore arm erythema, still evidence of induration Skin: Skin is warm and dry. No rash noted. No erythema.  Psychiatric: He has a normal mood and affect. His behavior is normal.    Lab Results Recent Labs    04/15/20 0744 04/16/20 1106  WBC 8.0 5.5  HGB 9.7* 9.2*  HCT 29.8* 29.5*  NA 137 139  K 3.7 4.2  CL 105 106  CO2 25 26  BUN 10 11  CREATININE 0.74 0.85    Microbiology: 8/17 acinetobacter and pseudomonas   Organism ID, Bacteria METHICILLIN RESISTANT STAPHYLOCOCCUS  AUREUS   Resulting Agency [facility]  Susceptibility   Methicillin resistant staphylococcus aureus    MIC    CIPROFLOXACIN >=8 RESISTANT  Resistant    CLINDAMYCIN <=0.25 SENS... Sensitive    ERYTHROMYCIN >=8 RESISTANT  Resistant    GENTAMICIN <=0.5 SENSI... Sensitive    Inducible Clindamycin NEGATIVE  Sensitive    OXACILLIN >=4 RESISTANT  Resistant    RIFAMPIN <=0.5 SENSI... Sensitive    TETRACYCLINE <=1 SENSITIVE  Sensitive    TRIMETH/SULFA >=320 RESIS... Resistant    VANCOMYCIN <=0.5 SENSI... Sensitive       Studies/Results: VAS [place] UPPER EXTREMITY VENOUS DUPLEX  Result Date: 04/15/2020 UPPER VENOUS STUDY  Indications: Pain, and Swelling Risk Factors: IVDU. Limitations: Poor ultrasound/tissue interface. Comparison Study: No prior studies. Performing Technologist: Chanda Busing RVT  Examination Guidelines: A complete evaluation includes B-mode imaging, spectral Doppler, color Doppler, and power Doppler as needed of all accessible portions of each vessel. Bilateral testing is considered an integral part of a complete examination. Limited examinations for reoccurring indications may be performed as noted.  Right Findings: +----------+------------+---------+-----------+----------+-------+ RIGHT     CompressiblePhasicitySpontaneousPropertiesSummary +----------+------------+---------+-----------+----------+-------+ Subclavian    Full       Yes       Yes                      +----------+------------+---------+-----------+----------+-------+  Left Findings: +----------+------------+---------+-----------+----------+-------+ LEFT      CompressiblePhasicitySpontaneousPropertiesSummary +----------+------------+---------+-----------+----------+-------+ IJV           Full       Yes       Yes                      +----------+------------+---------+-----------+----------+-------+ Subclavian    Full       Yes       Yes                       +----------+------------+---------+-----------+----------+-------+ Axillary      Full       Yes       Yes                      +----------+------------+---------+-----------+----------+-------+ Brachial      Full       Yes       Yes                      +----------+------------+---------+-----------+----------+-------+ Radial        Full                                          +----------+------------+---------+-----------+----------+-------+ Ulnar         Full                                          +----------+------------+---------+-----------+----------+-------+ Cephalic      None                                   Acute  +----------+------------+---------+-----------+----------+-------+ Basilic       Full                                          +----------+------------+---------+-----------+----------+-------+ Thrombus noted in the cephalic vein extends from the [facility] into the forearm.  Summary:  Right: No evidence of thrombosis in the subclavian.  Left: No evidence of deep vein thrombosis in the upper extremity. Findings consistent with acute superficial vein thrombosis involving the left cephalic vein.  *See table(s) above for measurements and observations.  Diagnosing physician: Gretta Began MD Electronically signed by Gretta Began MD on 04/15/2020 at 5:01:23 PM.    Final    [place] EKG SITE RITE  Result Date: 04/17/2020 If Site Rite image not attached, placement could not be confirmed due to current cardiac rhythm.    Assessment/Plan: Disseminated MRSA bacteremia, TV endocarditis and AC deep tissue infection = continue on linezolid until IV access then switch back to vancomycin  Secondary pseudomonas, acinobacter bacteremia = thought to be due to infiltrative PIV, that is improving. Continue on levofloxacin - then change back to meropenem once IV is re-established  Opiate dependence/depression = continue on duloxetine  Right AC wound = continue with daily  dressing changes  Needs IR to do tunneled access  Will see back on monday  [facility] Cell: [phone] Pager: [phone]  04/17/2020, 4:12 PM

## 2020-04-17 NOTE — Plan of Care (Signed)
  Problem: Education: Goal: Knowledge of General Education information will improve Description: Including pain rating scale, medication(s)/side effects and non-pharmacologic comfort measures Outcome: Progressing   Problem: Health Behavior/Discharge Planning: Goal: Ability to manage health-related needs will improve Outcome: Progressing   Problem: Clinical Measurements: Goal: Will remain free from infection Outcome: Progressing   

## 2020-04-18 DIAGNOSIS — B9689 Other specified bacterial agents as the cause of diseases classified elsewhere: Secondary | ICD-10-CM

## 2020-04-18 DIAGNOSIS — L02412 Cutaneous abscess of left axilla: Secondary | ICD-10-CM

## 2020-04-18 LAB — BASIC METABOLIC PANEL
Anion gap: 8 (ref 5–15)
BUN: 11 mg/dL (ref 6–20)
CO2: 29 mmol/L (ref 22–32)
Calcium: 8.9 mg/dL (ref 8.9–10.3)
Chloride: 103 mmol/L (ref 98–111)
Creatinine, Ser: 1.09 mg/dL (ref 0.61–1.24)
GFR calc Af Amer: >60 mL/min (ref >60)
GFR calc non Af Amer: >60 mL/min (ref >60)
Glucose, Bld: 127 mg/dL — ABNORMAL HIGH (ref 70–99)
Potassium: 4.1 mmol/L (ref 3.5–5.1)
Sodium: 140 mmol/L (ref 135–145)

## 2020-04-18 LAB — CBC WITH DIFFERENTIAL/PLATELET
Abs Immature Granulocytes: 0.03 10*3/uL (ref 0.00–0.07)
Basophils Absolute: 0 10*3/uL (ref 0.0–0.1)
Basophils Relative: 1 %
Eosinophils Absolute: 0.2 10*3/uL (ref 0.0–0.5)
Eosinophils Relative: 3 %
HCT: 29.9 % — ABNORMAL LOW (ref 39.0–52.0)
Hemoglobin: 9.4 g/dL — ABNORMAL LOW (ref 13.0–17.0)
Immature Granulocytes: 1 %
Lymphocytes Relative: 34 %
Lymphs Abs: 2 10*3/uL (ref 0.7–4.0)
MCH: 26.3 pg (ref 26.0–34.0)
MCHC: 31.4 g/dL (ref 30.0–36.0)
MCV: 83.5 fL (ref 80.0–100.0)
Monocytes Absolute: 0.6 10*3/uL (ref 0.1–1.0)
Monocytes Relative: 10 %
Neutro Abs: 3 10*3/uL (ref 1.7–7.7)
Neutrophils Relative %: 51 %
Platelets: 218 10*3/uL (ref 150–400)
RBC: 3.58 MIL/uL — ABNORMAL LOW (ref 4.22–5.81)
RDW: 14.6 % (ref 11.5–15.5)
WBC: 5.8 10*3/uL (ref 4.0–10.5)
nRBC: 0 % (ref 0.0–0.2)

## 2020-04-18 NOTE — Progress Notes (Signed)
Pt had visitor this afternoon, after becoming frustrated with the unit secretary pt visitor issue was cleared up. Visitor was on unit for no longer than 5 min per 2NT reports and then left in a rush. 2 NTs report odd behavior by visitor, will continue to monitor.

## 2020-04-18 NOTE — Progress Notes (Signed)
Per phlebotomy - pt refused lab draw.  States "he is in the bathroom".

## 2020-04-18 NOTE — Progress Notes (Signed)
Report received by this RN, I agree with the charted morning assessment, will continue to monitor.

## 2020-04-18 NOTE — Progress Notes (Signed)
HD#17 Subjective:  Overnight Events: patient was unable to get tunnel cath overnight.    Patient resting comfortably in bed. Denies new symptoms today. Waiting on IV access today.   Objective:  Vital signs in last 24 hours: Vitals:   04/17/20 0811 04/17/20 1355 04/17/20 2059 04/18/20 0410  BP: 106/67 110/70 106/62 (!) 101/59  Pulse: 63 80 69 (!) 54  Resp: 17 17 17 16   Temp: (!) 97.5 F (36.4 C) 97.6 F (36.4 C) 98.4 F (36.9 C) 98.2 F (36.8 C)  TempSrc: Oral Oral Oral Oral  SpO2: 100% 99% 100% 100%  Weight:      Height:       Supplemental O2: Room Air SpO2: 100 %   Physical Exam:  Physical Exam  Filed Weights   03/31/20 2303 04/07/20 0453 04/14/20 0758  Weight: 79.4 kg 77 kg 77 kg     Intake/Output Summary (Last 24 hours) at 04/18/2020 0649 Last data filed at 04/18/2020 0300 Gross per 24 hour  Intake 1200 ml  Output --  Net 1200 ml   Net IO Since Admission: 17,262.47 mL [04/18/20 0649]  Pertinent Labs: CBC Latest Ref Rng & Units 04/18/2020 04/16/2020 04/15/2020  WBC 4.0 - 10.5 K/uL 5.8 5.5 8.0  Hemoglobin 13.0 - 17.0 g/dL 04/17/2020) 1.6(W) 7.3(X)  Hematocrit 39 - 52 % 29.9(L) 29.5(L) 29.8(L)  Platelets 150 - 400 K/uL 218 179 153    CMP Latest Ref Rng & Units 04/16/2020 04/15/2020 04/14/2020  Glucose 70 - 99 mg/dL 04/16/2020) 269(S) 854(O)  BUN 6 - 20 mg/dL 11 10 10   Creatinine 0.61 - 1.24 mg/dL 270(J 5.00  Sodium 135 - 145 mmol/L 139 137 131(L)  Potassium 3.5 - 5.1 mmol/L 4.2 3.7 3.9  Chloride 98 - 111 mmol/L 106 105 99  CO2 22 - 32 mmol/L 26 25 21(L)  Calcium 8.9 - 10.3 mg/dL 9.38) 1.82) 9.9(B)  Total Protein 6.5 - 8.1 g/dL - - -  Total Bilirubin 0.3 - 1.2 mg/dL - - -  Alkaline Phos 38 - 126 U/L - - -  AST 15 - 41 U/L - - -  ALT 0 - 44 U/L - - -    Pending Labs: none  Imaging: 7.1(I EKG SITE RITE  Result Date: 04/17/2020 If Site Rite image not attached, placement could not be confirmed due to current cardiac rhythm.   Assessment/Plan:    Principal Problem:   MRSA bacteremia Active Problems:   Abscess of forearm, right   IVDU (intravenous drug user)   Opioid use disorder, severe, dependence (HCC)   COVID-19   Endocarditis of tricuspid valve   Postprocedural fever    Patient Summary: Jon Castro is a 34 year old person who uses IV drugs(heroin), history of MRSA/Candida glabrata bacteremia in 03/2019 (subsequent 3-week hospital stay), major depressive disorder with psychosis, hepatitis C virus (untreated)admitted with 3-day history of swelling of the right forearm with associated pain and erythemaand found to have R antecubital abscess c/b MRSA bacteremia and positive forSARS-CoV-2. Found to have bacterial endocarditis. Now with polymicrobial gram negative bacteremia.    He is on hospital day 17 and doing well.   MRSA bacteremia 2/2 to Rt antecubital abscess s/p I&D (8/4) and complicated by tricuspid valve endocarditis Plan for patient to get IV access today via tunnel cath and switch back to IV vancomycin for a total of 4 weeks of IV antibiotics in the hospital follow by 2 weeks of PO antibiotics.  - Continue Linezolid.  - IR consulted for  tunnel cath. - Switch back to IV vancomycin today.  - Appreciate ID's assitance  Polymicrobial gram negative bacteremia 2/2 to Rt antecubital fossa cellulitis - No fevers overnight and WBC is wnl - On day 3 of Levofloxacin for a total of 7 days   Acute superficial vein thrombosis involving the left cephalic vein - Warm compresses - ASA 81 mg  Diet: Normal IVF: None,None VTE: SCDs Code: Full PT/OT recs: None, none. TOC recs: none   Dispo: Anticipated discharge to Home once complete 4 weeks of IV AB.   Dellia Cloud, D.O. [facility], PGY-2 Date 04/18/2020 Time 6:49 AM Pager: [phone] Please contact the on call pager after 5 pm and on weekends at [phone].

## 2020-04-19 DIAGNOSIS — F323 Major depressive disorder, single episode, severe with psychotic features: Secondary | ICD-10-CM

## 2020-04-19 NOTE — Progress Notes (Signed)
   Subjective: No acute events overnight. He mentions that he was told his central line wouldn't be placed until tomorrow. He is frustrated that he has not been able to get fresh air this entire hospitalization. I assured him I will try to work with nursing staff to see if this can be arranged.  He is otherwise doing well without any new complaints.   Objective:  Vital signs in last 24 hours: Vitals:   04/18/20 0825 04/18/20 1950 04/19/20 0317 04/19/20 1046  BP: 112/63 (!) 113/55 110/73 115/71  Pulse: 69 62 74 71  Resp: 17 17 18 18   Temp: 97.8 F (36.6 C) 97.8 F (36.6 C) 97.6 F (36.4 C) 98.2 F (36.8 C)  TempSrc: Oral Oral Oral Oral  SpO2: 100% 98% 100% 100%  Weight:      Height:       General: awake, alert, sitting up in bed in NAD Ext: swelling improved in LUE, no redness or tenderness. Good pulses BUE   Assessment/Plan:  Principal Problem:   MRSA bacteremia Active Problems:   Abscess of forearm, right   IVDU (intravenous drug user)   Opioid use disorder, severe, dependence (HCC)   COVID-19   Endocarditis of tricuspid valve   Postprocedural fever  Mr. Witzke is a 34 year old person who uses IV drugs(heroin), history of MRSA/Candida glabrata bacteremia in 03/2019 (subsequent 3-week hospital stay), major depressive disorder with psychosis, hepatitis C virus (untreated)admitted with 3-day history of swelling of the right forearm with associated pain and erythemaand found to have R antecubital abscess c/b MRSA bacteremia and positive forSARS-CoV-2.Found to have bacterial endocarditis. Now with polymicrobial gram negative bacteremia.   He is on hospital day 18 and doing well.   MRSA bacteremia 2/2 to Rt antecubital abscess s/p I&D (8/4) and complicated by tricuspid valve endocarditis Unfortunately, patient has had to remain without IV access all weekend due to challenges with IR schedule. He will get tunnel cath tomorrow and switch back to IV vancomycin for a total  of 4 weeks of IV antibiotics in the hospital, followed by 2 weeks of PO antibiotics.  - Continue Linezolid.  - IR consulted for tunnel cath. - Switch back to IV vancomycin 04/19/20.  - Appreciate ID's assitance   Polymicrobial gram negative bacteremia 2/2 to Rt antecubital fossa cellulitis - No fevers overnight and WBC is wnl - On day 4 of Levofloxacin for a total of 7 days; transition back to meropenem when he has IV access  Acute superficial vein thrombosis involving the left cephalic vein - Warm compresses - ASA 81 mg  Dispo: Anticipated discharge after completion of IV antibiotic therapy (day 18/28)  Azizah Lisle D, DO 04/19/2020, 3:19 PM Pager: [phone] After 5pm on weekdays and 1pm on weekends: On Call pager [phone]

## 2020-04-19 NOTE — Discharge Summary (Signed)
Name: Jon Castro MRN: [id number] DOB: 13-Nov-1985 34 y.o. PCP: Patient, [name] Per  Date of Admission: 03/31/2020 10:25 PM Date of Discharge: 04/22/20 Attending Physician: Inez Catalina, MD  Discharge Diagnosis: 1. MRSA bacteremia secondary to right antecubital abscess status-post incision & drainage (04/01/20) and complicated by tricuspid valve endocarditis 2. Polymicrobial gram negative bacteremia  3. Acute superficial vein thrombosis involving the left cephalic vein  Discharge Medications: Allergies as of 04/22/2020      Reactions   Vicodin [hydrocodone-acetaminophen] Itching   Codeine Nausea And Vomiting   Codeine    Tramadol Other (See Comments)   "messes with head"   Tramadol    Vicodin [hydrocodone-acetaminophen]       Medication List    TAKE these medications   DULoxetine 30 MG capsule Commonly known as: CYMBALTA Take 1 capsule (30 mg total) by mouth daily.   levofloxacin 750 MG tablet Commonly known as: LEVAQUIN Take 1 tablet (750 mg total) by mouth daily for 15 days.   linezolid 600 MG tablet Commonly known as: ZYVOX Take 1 tablet (600 mg total) by mouth every 12 (twelve) hours for 15 days.   nicotine 14 mg/24hr patch Commonly known as: NICODERM CQ - dosed in mg/24 hours Place 1 patch (14 mg total) onto the skin daily.   Oxycodone HCl 20 MG Tabs Take 1 tablet (20 mg total) by mouth every 6 (six) hours for 7 days.            Discharge Care Instructions  (From admission, onward)         Start     Ordered   04/22/20 0000  No dressing needed        04/22/20 1430          Disposition and follow-up:   Jon Castro was discharged from [facility] in Good condition.  At the hospital follow-up visit please address:  1.    - Treatment for bacterial endocarditis: Jon Castro was discharged with instructions to continue taking linezolid (Zyvox) until his follow-up with ID on 05/05/20 after which ID would determine  need for additional treatment. Linezolid can be associated with thrombocytopenia. Would consider obtaining CBC w/ diff to screen for this. ID is planning on doing so at the patient's 05/05/20 follow-up appointment. - Treatment for gram negative bacteremia: Jon Castro was discharged with instructions to continue taking levofloxacin until his follow-up with ID on 05/05/20 - Opiate use disorder: At discharge, Jon Castro expressed interest in scheduling an appointment with our OUD clinic. Someone should reach out to him to schedule this appointment. - Patient instructed to contact [facility] to schedule a wound check 2-3 weeks after discharge  2.  Labs / imaging needed at time of follow-up:   - consider CBC to monitor platelets  3.  Pending labs/ test needing follow-up: none  Follow-up Appointments:  Follow-up Information    [facility] Follow up on 05/27/2020.   Why:  9:30 am with Dr. Risa Grill, hospital f/u and to establish primary care  Contact information: [address] [place] [address] [phone]       [facility]. Schedule an appointment as soon as possible for a visit in 2 week(s).   Specialty: Specialist Contact information: [address] [place] [address] [phone]              - Infectious disease clinic appointment 05/05/20 at 11:15 am  Hospital Course by  problem list:  Jon Castro is a 34 year old man who uses IV drugs(heroin), history of MRSA/Candida glabrata bacteremia in 03/2019 (subsequent 3-week hospital stay), major depressive disorder with psychosis, hepatitis C virus (untreated)admitted with 3-day history of swelling of the right forearm with associated pain and erythemaand found to have R antecubital abscess c/b MRSA bacteremia and positive forSARS-CoV-2. He was taken for I&D on 04/01/20. Later found to have tricuspid valve endocarditis. His course was also complicated by  polymicrobial gram negative bacteremia suspected to be secondary to an infiltrated IV.  MRSA bacteremia 2/2 to Rt antecubital abscess s/p I&D (8/4) and complicated by tricuspid valve endocarditis Taken for I&D by ortho on 04/01/20. Blood cultures later positive for MRSA. Initiated treatment with IV vancomycin. His TTE did not show evidence of vegetations.However, found to have a murmur on exam. Once out of COVID isolation TEE was obtained and demonstrated vegetation on the tricuspid valve. Transitioned to PO linezolid, which he was instructed to continue until ID follow-up appointment on 9/7. - Continue PO linezolid (Zyvox) until ID follow-up appt on 9/7 - Patient instructed to follow-up with ortho in 2-3 weeks for I&D surgical site check - 1-week of oxy at discharge for pain control  Polymicrobial gram negative bacteremia 2/2 to infiltrated IV On hospital day 13 (8/17) patient febrile. Exam remarkable for LUE warmth, erythema, and swelling around site of previously infiltrated PIV. Repeat blood cultures positive for Gram negative species A.calcoaceticus-baumannii, Enterobacterales, Enterobacter cloacae complex, and Pseudomonas aeruginosa. Suspected to be secondary to infiltrated IV. During hospitalization, patient completed 7 days of levofloxacin, which he will continue until follow-up with infectious disease on 05/05/2020. - Continue levofloxacin until ID follow-up on 05/05/20  Acute superficial vein thrombosis involving the left cephalic vein Diagnosed by L upper extremity ultrasound. Treated with warm compresses and aspirin 81 mg daily.  Positive SARS-CoV-2 Found on admission. Patient was asymptomatic and without oxygen requirement, so no treatment was indicated.  Opiate Use Disorder (Person Who Injects Drugs) Last used 3 days prior to admission. Reportedly tried suboxone and methadone in the past without success. Monitored with COWS however did not display signs or endorse symptoms of withdrawal.  At discharge, patient expressed interest in being scheduled for appointment at our opiate use disorder clinic. - We will schedule first available suboxone clinic appointment for patient in September  Hepatitis C virus-untreated Hep C viral load in February 2021 was 9 million. - Patient will follow-up with infectious disease outpatient for treatment  Discharge Vitals:   BP (!) 98/59 (BP Location: Left Arm)   Pulse 83   Temp 97.8 F (36.6 C) (Oral)   Resp 16   Ht 6\' 1"  (1.854 m)   Wt 77 kg   SpO2 99%   BMI 22.40 kg/m   Pertinent Labs, Studies, and Procedures:   04/14/20 Transesophageal Echo and Color Doppler   IMPRESSIONS   1. Left ventricular ejection fraction, by estimation, is 65 to 70%. The  left ventricle has normal function. The left ventricle has no regional  wall motion abnormalities.  2. Right ventricular systolic function is normal. The right ventricular  size is normal.  3. No left atrial/left atrial appendage thrombus was detected.  4. The mitral valve is normal in structure. Trivial mitral valve  regurgitation. No evidence of mitral stenosis.  5. There is a linear mobile echodensity attached to the RV side of the TV  leaflet, consistent with endocarditis.. The tricuspid valve is abnormal.  6. The aortic valve is tricuspid. Aortic valve regurgitation  is not  visualized. No aortic stenosis is present.   Conclusion(s)/Recommendation(s): Findings are concerning for  vegetation/infective endocarditis as detailed above. There is a linear  mobile echodensity attached to the RV side of the TV leaflet.   Discharge Instructions: Discharge Instructions    Discharge instructions   Complete by: As directed    Mr. Datta, it was a pleasure taking care of you. You were treated for your abscess, blood stream infections, and heart infection. You will continue taking your two antibiotics until you follow-up with infectious disease on 9/7. We also recommend that you see a  primary care physician for a hospital follow-up in the next couple of weeks. We will also arrange for you to follow-up in our suboxone clinic. For your surgical site, you will need to schedule follow-up with orthopedic surgery [phone]). We have written a prescription for pain medicine for the next week.  We encourage you to get your COVID-19 vaccine as soon as you are able.  Take care!   No dressing needed   Complete by: As directed       Signed: Alphonzo Severance, MD 04/22/2020, 4:18 PM   Pager: [phone]

## 2020-04-20 LAB — CULTURE, BLOOD (ROUTINE X 2)

## 2020-04-20 LAB — CBC
HCT: 34.7 % — ABNORMAL LOW (ref 39.0–52.0)
Hemoglobin: 10.8 g/dL — ABNORMAL LOW (ref 13.0–17.0)
MCH: 26 pg (ref 26.0–34.0)
MCHC: 31.1 g/dL (ref 30.0–36.0)
MCV: 83.4 fL (ref 80.0–100.0)
Platelets: 252 10*3/uL (ref 150–400)
RBC: 4.16 MIL/uL — ABNORMAL LOW (ref 4.22–5.81)
RDW: 14.4 % (ref 11.5–15.5)
WBC: 6 10*3/uL (ref 4.0–10.5)
nRBC: 0 % (ref 0.0–0.2)

## 2020-04-20 MED ORDER — ENOXAPARIN SODIUM 40 MG/0.4ML ~~LOC~~ SOLN
40.0000 mg | SUBCUTANEOUS | Status: DC
  Filled 2020-04-20 (×2): qty 0.4

## 2020-04-20 MED ORDER — OXYCODONE HCL 5 MG PO TABS
20.0000 mg | ORAL_TABLET | Freq: Four times a day (QID) | ORAL | Status: DC
  Administered 2020-04-20 – 2020-04-22 (×9): 20 mg via ORAL
  Filled 2020-04-20 (×9): qty 4

## 2020-04-20 NOTE — Progress Notes (Signed)
   Subjective:   No acute events overnight.   Evaluated at bedside this morning. States he is doing okay. Still endorsing pain around his I&D site on his right arm and in his L arm. Inquired about changes to his pain meds and was told about spacing out his oxycodone meds now that his pain is better. Expresses his strong desire to go outside. Reiterated with the patient the hospital policy on leaving the hospital with a central line in place. States he is hungry from being NPO at midnight.  Objective:  Vital signs in last 24 hours: Vitals:   04/19/20 0317 04/19/20 1046 04/19/20 1954 04/20/20 0316  BP: 110/73 115/71 107/69 105/60  Pulse: 74 71 63 60  Resp: 18 18 18 16   Temp: 97.6 F (36.4 C) 98.2 F (36.8 C) 97.8 F (36.6 C) 98.3 F (36.8 C)  TempSrc: Oral Oral Oral Oral  SpO2: 100% 100% 100% 100%  Weight:      Height:       General: awake, alert,  sitting in chair wearing his street clothes Ext: swelling improved in LUE, no redness or tenderness. Good pulses BUE, R AC dressing had slipped off onto wrist, surgical site clean, dry, non-erythematous, single suture remaining  Assessment/Plan:  Principal Problem:   MRSA bacteremia Active Problems:   Abscess of forearm, right   IVDU (intravenous drug user)   Opioid use disorder, severe, dependence (HCC)   COVID-19   Endocarditis of tricuspid valve   Postprocedural fever  Mr. Stepter is a 34 year old person who uses IV drugs(heroin), history of MRSA/Candida glabrata bacteremia in 03/2019 (subsequent 3-week hospital stay), major depressive disorder with psychosis, hepatitis C virus (untreated)admitted with 3-day history of swelling of the right forearm with associated pain and erythemaand found to have R antecubital abscess c/b MRSA bacteremia and positive forSARS-CoV-2.Found to have bacterial endocarditis. Now with polymicrobial gram negative bacteremia.  He is on hospital day 19 and doing well.  MRSA bacteremia 2/2 to Rt  antecubital abscess s/p I&D (8/4) and complicated by tricuspid valve endocarditis Planned for tunneled catheter, but after further discussion with ID, patient is appropriate to complete his 6-week treatment of endocarditis with PO linezolid (Zyvox).  - Continue PO linezolid (Zyvox) until 9/17 to complete 6-week abx course - Appreciate ID's assitance   Polymicrobial gram negative bacteremia 2/2 to Rt antecubital fossa cellulitis - No fevers overnight and WBC is wnl - On day 5 of Levofloxacin for a total of 7 days (last day 8/25)  Acute superficial vein thrombosis involving the left cephalic vein - Warm compresses - ASA 81 mg  Dispo: Anticipated discharge 1-2 days.  9/25, MD 04/20/2020, 6:30 AM Pager: [phone] After 5pm on weekdays and 1pm on weekends: On Call pager [phone]

## 2020-04-20 NOTE — Progress Notes (Signed)
Patient went outside for fresh air with supersion of staff per order.

## 2020-04-20 NOTE — Progress Notes (Signed)
    [facility]    Date of Admission:  03/31/2020      ID: Jon Castro is a 34 y.o. male with   Principal Problem:   MRSA bacteremia Active Problems:   Abscess of forearm, right   IVDU (intravenous drug user)   Opioid use disorder, severe, dependence (HCC)   COVID-19   Endocarditis of tricuspid valve   Postprocedural fever    Subjective: Afebrile, less pain to left forearm or right arm  Medications:  . aspirin EC  81 mg Oral Daily  . DULoxetine  30 mg Oral Daily  . fluticasone  2 spray Each Nare Daily  . levofloxacin  750 mg Oral Daily  . linezolid  600 mg Oral Q12H  . oxyCODONE  20 mg Oral Q6H    Objective: Vital signs in last 24 hours: Temp:  [97.8 F (36.6 C)-98.3 F (36.8 C)] 98.2 F (36.8 C) (08/23 0837) Pulse Rate:  [60-65] 65 (08/23 0837) Resp:  [16-18] 16 (08/23 0837) BP: (105-108)/(60-71) 108/71 (08/23 0837) SpO2:  [100 %] 100 % (08/23 0837) Physical Exam  Constitutional: He is oriented to person, place, and time. He appears well-developed and well-nourished. No distress.  HENT:  Mouth/Throat: Oropharynx is clear and moist. No oropharyngeal exudate.  Cardiovascular: Normal rate, regular rhythm and normal heart sounds. Exam reveals no gallop and no friction rub.  No murmur heard.  Pulmonary/Chest: Effort normal and breath sounds normal. No respiratory distress. He has no wheezes.  Abdominal: Soft. Bowel sounds are normal. He exhibits no distension. There is no tenderness.  Ext: right arm healed. Suture in place. Left forearm some induration remains Neurological: He is alert and oriented to person, place, and time.  Skin: Skin is warm and dry. No rash noted. No erythema.  Psychiatric: He has a normal mood and affect. His behavior is normal.     Lab Results Recent Labs    04/18/20 0346 04/18/20 1622 04/20/20 0915  WBC 5.8  --  6.0  HGB 9.4*  --  10.8*  HCT 29.9*  --  34.7*  NA  --  140  --   K  --  4.1  --   CL   --  103  --   CO2  --  29  --   BUN  --  11  --   CREATININE  --  1.09  --    Microbiology: 8/6 blood cx = NGTD 8/4 blood cx =MRSA 8/7 blood cx = psa, enterobacter and acinetobacter  Studies/Results: No results found.   Assessment/Plan: Gram negative bacteremia in the setting of septic phlebitis = continue on levofloxacin  MRSA bacteremia/TV endocarditis = continue on linezolid (currently day 18)  Right AC abscess s/p debridement = now closed,healed, likely can remove suture tomorrow  Venous access = I think it would be okay for him to have right upper picc line. - would cage if he can stay for the next 10 days, otherwise, we keep him on linezolid.  Duke Salvia Drue Second MD MPH [facility] [phone]     [facility] Cell: [phone] Pager: [phone]  04/20/2020, 11:12 AM

## 2020-04-20 NOTE — Progress Notes (Signed)
New order for pt to remain NPO at this time except for sips/chips; pt informed of new order at this time.

## 2020-04-20 NOTE — Plan of Care (Signed)
  Problem: Education: Goal: Knowledge of General Education information will improve Description: Including pain rating scale, medication(s)/side effects and non-pharmacologic comfort measures Outcome: Progressing   Problem: Skin Integrity: Goal: Skin integrity will improve Outcome: Progressing   

## 2020-04-21 MED ORDER — LEVOFLOXACIN 750 MG PO TABS
750.0000 mg | ORAL_TABLET | Freq: Every day | ORAL | 0 refills | Status: DC
Start: 2020-04-21 — End: 2020-04-21

## 2020-04-21 MED ORDER — LEVOFLOXACIN 750 MG PO TABS
750.0000 mg | ORAL_TABLET | Freq: Every day | ORAL | 0 refills | Status: DC
Start: 2020-04-21 — End: 2020-05-05

## 2020-04-21 MED ORDER — LINEZOLID 600 MG PO TABS
600.0000 mg | ORAL_TABLET | Freq: Two times a day (BID) | ORAL | 0 refills | Status: DC
Start: 2020-04-21 — End: 2020-04-21

## 2020-04-21 MED ORDER — LINEZOLID 600 MG PO TABS
600.0000 mg | ORAL_TABLET | Freq: Two times a day (BID) | ORAL | 0 refills | Status: DC
Start: 2020-04-21 — End: 2020-05-05

## 2020-04-21 MED FILL — LINEZOLID 600 MG TABS: 600 | 15 days supply | Qty: 30 | Fill #0

## 2020-04-21 MED FILL — levoFLOXacin 750 MG TABS: 750 | 15 days supply | Qty: 15 | Fill #0

## 2020-04-21 NOTE — Progress Notes (Signed)
   Subjective:   No acute events overnight. Jon Castro was eating breakfast this morning and doing well.  Discussed with him that we are working on arranging for him to get his oral antibiotics covered prior to discharge tomorrow. He questioned if it was ok to leave knowing about the infection on his heart valve which we reassured was being covered appropriately by the oral antibiotics.   Returned to bedside shortly after rounds to discuss with patient options for pain management after discharge. Offered initiation of methadone or suboxone, and patient states these medicines have not been effective for him in the past. He would prefer a short course of oxycodone. 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  Objective:  Vital signs in last 24 hours: Vitals:   04/20/20 1200 04/20/20 1633 04/20/20 1927 04/21/20 0300  BP: 140/70 (!) 88/58 (!) 97/59 (!) 102/59  Pulse: 79 (!) 59 87 64  Resp:  16 17 16   Temp:  98 F (36.7 C) 98.1 F (36.7 C) 97.8 F (36.6 C)  TempSrc:  Oral Oral Oral  SpO2:  98% 99% 99%  Weight:      Height:       General: awake, alert, sitting comfortably in bed eating breakfast CV: RRR; SEM  Ext: swelling and redness continue to improve in LUE. R AC dressing loose, surgical site clean, dry, non-erythematous, single suture remaining  Assessment/Plan:  Principal Problem:   MRSA bacteremia Active Problems:   Abscess of forearm, right   IVDU (intravenous drug user)   Opioid use disorder, severe, dependence (HCC)   COVID-19   Endocarditis of tricuspid valve   Postprocedural fever  Jon Castro is a 34 year old person who uses IV drugs(heroin), history of MRSA/Candida glabrata bacteremia in 03/2019 (subsequent 3-week hospital stay), major depressive disorder with psychosis, hepatitis C virus (untreated)admitted with 3-day history of swelling of the right forearm with  associated pain and erythemaand found to have R antecubital abscess c/b MRSA bacteremia and positive forSARS-CoV-2.Found to have bacterial endocarditis. Now with polymicrobial gram negative bacteremia.  He is on hospital day 20 and doing well. Anticipate discharge tomorrow.  MRSA bacteremia 2/2 to Rt antecubital abscess s/p I&D (8/4) and complicated by tricuspid valve endocarditis Patient is appropriate to continue his treatment of endocarditis with PO linezolid (Zyvox) until 9/7 for a total of 4 weeks with ID follow-up to decide whether he will require 2 additional weeks to complete a 6-week course. Patient is ready for discharge from ID standpoint. - Continue PO linezolid (Zyvox) until ID follow-up appt on 9/7 - Appreciate ID's assitance  - One suture remaining in R AC. Will contact ortho tomorrow for wound check. - Plan for 1-week of oxy at discharge for pain control  Polymicrobial gram negative bacteremia 2/2 to infiltrated IV - No fevers overnight and WBC is wnl - On day 6 of Levofloxacin, will continue until ID follow-up on 9/7  Acute superficial vein thrombosis involving the left cephalic vein - Warm compresses - ASA 81 mg  Dispo: Anticipated discharge tomorrow. Transition of care pharmacy to deliver medications to bedside. TOC also working on helping patient find PCP.  11/7, MD 04/21/2020, 6:57 AM Pager: [phone] After 5pm on weekdays and 1pm on weekends: On Call pager [phone]

## 2020-04-21 NOTE — TOC CAGE-AID Note (Signed)
Transition of Care [facility]) - CAGE-AID Screening   Patient Details  Name: Jon Castro MRN: [id number] Date of Birth: 03-Jan-1986  Transition of Care [facility]) CM/SW Contact:    Emeterio Reeve, [place] Phone Number: 04/21/2020, 3:31 PM   Clinical Narrative:  CSW met with pt at bedside. CSW introduced self and explained her role at the hospital.  Pt reports that he hasnt used in weeks. CSW informed pt he was positive when admitted. Pt stated that he is now "clean". CSW discussed the need for treatment when leaving the hospital to continue in sobriety. Pt stated he believes he can maintain on his own.   Pt was receptive to resources and Scientist, clinical (histocompatibility and immunogenetics). CSW encouraged pt to use resources provided.    CAGE-AID Screening:    Have You Ever Felt You Ought to Cut Down on Your Drinking or Drug Use?: Yes Have People Annoyed You By Critizing Your Drinking Or Drug Use?: No Have You Felt Bad Or Guilty About Your Drinking Or Drug Use?: No Have You Ever Had a Drink or Used Drugs First Thing In The Morning to Steady Your Nerves or to Get Rid of a Hangover?: No CAGE-AID Score: 1  Substance Abuse Education Offered: Yes  Substance abuse interventions: Patient Counseling, Educational Materials   Emeterio Reeve, Latanya Presser, [place] Social Worker [phone]

## 2020-04-21 NOTE — Progress Notes (Signed)
[facility]  Date of Admission:  03/31/2020     Total days of antibiotics 20         ASSESSMENT:  Jon Castro continues treatment for MRSA bacteremia complicated by tricuspid valve endocarditis and  infiltrated IV resulting in Pseudomonas and Acinetobacter bacteremia. Remains on Zyvox and levofloxacin with plans for discharge tomorrow. [facility] arranging for medication to the bedside with supply through 9/7. Will arrange for follow up in the ID clinic to determine further extension of antibiotics if needed.   PLAN:  1. Continue Zyvox and Levofloxacin.  2. Transition of care pharmacy to deliver medications to bedside. 3. Primary team working on pain medication management and substance use disorder for discharge.  4. Follow up in the ID office .  5. Ok for discharge from ID standpoint.   Principal Problem:   MRSA bacteremia Active Problems:   Abscess of forearm, right   IVDU (intravenous drug user)   Opioid use disorder, severe, dependence (HCC)   COVID-19   Endocarditis of tricuspid valve   Postprocedural fever   . aspirin EC  81 mg Oral Daily  . DULoxetine  30 mg Oral Daily  . enoxaparin (LOVENOX) injection  40 mg Subcutaneous Q24H  . fluticasone  2 spray Each Nare Daily  . levofloxacin  750 mg Oral Daily  . linezolid  600 mg Oral Q12H  . oxyCODONE  20 mg Oral Q6H    SUBJECTIVE:  Afebrile overnight with no acute events.   Allergies  Allergen Reactions  . Vicodin [Hydrocodone-Acetaminophen] Itching  . Codeine Nausea And Vomiting  . Codeine   . Tramadol Other (See Comments)    "messes with head"  . Tramadol   . Vicodin [Hydrocodone-Acetaminophen]      Review of Systems: Review of Systems  Constitutional: Negative for chills, fever and weight loss.  Respiratory: Negative for cough, shortness of breath and wheezing.   Cardiovascular: Negative for chest pain and leg swelling.  Gastrointestinal: Negative for  abdominal pain, constipation, diarrhea, nausea and vomiting.  Skin: Negative for rash.      OBJECTIVE: Vitals:   04/20/20 1200 04/20/20 1633 04/20/20 1927 04/21/20 0300  BP: 140/70 (!) 88/58 (!) 97/59 (!) 102/59  Pulse: 79 (!) 59 87 64  Resp:  16 17 16   Temp:  98 F (36.7 C) 98.1 F (36.7 C) 97.8 F (36.6 C)  TempSrc:  Oral Oral Oral  SpO2:  98% 99% 99%  Weight:      Height:       Body mass index is 22.4 kg/m.  Physical Exam Constitutional:      General: Jon Castro is not in acute distress.    Appearance: Jon Castro is well-developed.  Cardiovascular:     Rate and Rhythm: Normal rate and regular rhythm.     Heart sounds: Normal heart sounds.  Pulmonary:     Effort: Pulmonary effort is normal.     Breath sounds: Normal breath sounds.  Musculoskeletal:     Comments: Bilateral arms without evidence of infection. Suture remains in right antecubital fossa.   Skin:    General: Skin is warm and dry.  Neurological:     Mental Status: Jon Castro is alert and oriented to person, place, and time.  Psychiatric:        Mood and Affect: Mood normal.     Lab Results Lab Results  Component Value Date   WBC 6.0 04/20/2020   HGB 10.8 (L) 04/20/2020   HCT  34.7 (L) 04/20/2020   MCV 83.4 04/20/2020   PLT 252 04/20/2020    Lab Results  Component Value Date   CREATININE 1.09 04/18/2020   BUN 11 04/18/2020   NA 140 04/18/2020   K 4.1 04/18/2020   CL 103 04/18/2020   CO2 29 04/18/2020    Lab Results  Component Value Date   ALT 62 (H) 04/02/2020   AST 44 (H) 04/02/2020   ALKPHOS 59 04/02/2020   BILITOT 0.5 04/02/2020     Microbiology: Recent Results (from the past 240 hour(s))  Culture, blood (routine x 2)     Status: Abnormal   Collection Time: 04/14/20  2:27 PM   Specimen: BLOOD RIGHT HAND  Result Value Ref Range Status   Specimen Description BLOOD RIGHT HAND  Final   Special Requests   Final    BOTTLES DRAWN AEROBIC ONLY Blood Culture results may not be optimal due to an inadequate  volume of blood received in culture bottles   Culture  Setup Time   Final    GRAM NEGATIVE RODS AEROBIC BOTTLE ONLY Organism ID to follow CRITICAL RESULT CALLED TO, READ BACK BY AND VERIFIED WITH: PHARM D A.WOLFE AT 0954 ON 04/15/20 BY T.SAAD    Culture (A)  Final    PSEUDOMONAS AERUGINOSA ACINETOBACTER CALCOACETICUS/BAUMANNII COMPLEX SUSCEPTIBILITIES PERFORMED ON PREVIOUS CULTURE WITHIN THE LAST 5 DAYS. Performed at [facility], [address] [place], [place] [address]    Report Status 04/20/2020 FINAL  Final   Organism ID, Bacteria PSEUDOMONAS AERUGINOSA  Final      Susceptibility   Pseudomonas aeruginosa - MIC*    CEFTAZIDIME 4 SENSITIVE Sensitive     CIPROFLOXACIN <=0.25 SENSITIVE Sensitive     GENTAMICIN <=1 SENSITIVE Sensitive     IMIPENEM 1 SENSITIVE Sensitive     PIP/TAZO 8 SENSITIVE Sensitive     CEFEPIME 2 SENSITIVE Sensitive     * PSEUDOMONAS AERUGINOSA  Culture, blood (routine x 2)     Status: Abnormal   Collection Time: 04/14/20  2:27 PM   Specimen: BLOOD RIGHT ARM  Result Value Ref Range Status   Specimen Description BLOOD RIGHT ARM  Final   Special Requests   Final    BOTTLES DRAWN AEROBIC ONLY Blood Culture results may not be optimal due to an inadequate volume of blood received in culture bottles   Culture  Setup Time   Final    GRAM NEGATIVE RODS AEROBIC BOTTLE ONLY Performed at [facility], [address] [place], [place] [address]    Culture (A)  Final    ENTEROBACTER CLOACAE ACINETOBACTER CALCOACETICUS/BAUMANNII COMPLEX    Report Status 04/20/2020 FINAL  Final   Organism ID, Bacteria ENTEROBACTER CLOACAE  Final   Organism ID, Bacteria ACINETOBACTER CALCOACETICUS/BAUMANNII COMPLEX  Final      Susceptibility   Acinetobacter calcoaceticus/baumannii complex - MIC*    CEFTAZIDIME 4 SENSITIVE Sensitive     CIPROFLOXACIN <=0.25 SENSITIVE Sensitive     GENTAMICIN <=1 SENSITIVE Sensitive     IMIPENEM <=0.25 SENSITIVE Sensitive     PIP/TAZO  <=4 SENSITIVE Sensitive     TRIMETH/SULFA <=20 SENSITIVE Sensitive     AMPICILLIN/SULBACTAM <=2 SENSITIVE Sensitive     * ACINETOBACTER CALCOACETICUS/BAUMANNII COMPLEX   Enterobacter cloacae - MIC*    CEFAZOLIN >=64 RESISTANT Resistant     CEFEPIME <=0.12 SENSITIVE Sensitive     CEFTAZIDIME <=1 SENSITIVE Sensitive     CIPROFLOXACIN <=0.25 SENSITIVE Sensitive     GENTAMICIN <=1 SENSITIVE  Sensitive     IMIPENEM 0.5 SENSITIVE Sensitive     TRIMETH/SULFA <=20 SENSITIVE Sensitive     PIP/TAZO <=4 SENSITIVE Sensitive     * ENTEROBACTER CLOACAE  Blood Culture ID Panel (Reflexed)     Status: Abnormal   Collection Time: 04/14/20  2:27 PM  Result Value Ref Range Status   Enterococcus faecalis NOT DETECTED NOT DETECTED Final   Enterococcus Faecium NOT DETECTED NOT DETECTED Final   Listeria monocytogenes NOT DETECTED NOT DETECTED Final   Staphylococcus species NOT DETECTED NOT DETECTED Final   Staphylococcus aureus (BCID) NOT DETECTED NOT DETECTED Final   Staphylococcus epidermidis NOT DETECTED NOT DETECTED Final   Staphylococcus lugdunensis NOT DETECTED NOT DETECTED Final   Streptococcus species NOT DETECTED NOT DETECTED Final   Streptococcus agalactiae NOT DETECTED NOT DETECTED Final   Streptococcus pneumoniae NOT DETECTED NOT DETECTED Final   Streptococcus pyogenes NOT DETECTED NOT DETECTED Final   A.calcoaceticus-baumannii DETECTED (A) NOT DETECTED Final    Comment: CRITICAL RESULT CALLED TO, READ BACK BY AND VERIFIED WITH: PHARMD A WOLFE [id number] 0954 T SAAD    Bacteroides fragilis NOT DETECTED NOT DETECTED Final   Enterobacterales DETECTED (A) NOT DETECTED Final    Comment: Enterobacterales represent a large order of gram negative bacteria, not a single organism. CRITICAL RESULT CALLED TO, READ BACK BY AND VERIFIED WITH: PHARMD A WOLFE [id number] 0954 T SAAD    Enterobacter cloacae complex DETECTED (A) NOT DETECTED Final    Comment: CRITICAL RESULT CALLED TO, READ BACK BY AND VERIFIED  WITH: PHARMD A WOLFE [id number] 0954 T SAAD    Escherichia coli NOT DETECTED NOT DETECTED Final   Klebsiella aerogenes NOT DETECTED NOT DETECTED Final   Klebsiella oxytoca NOT DETECTED NOT DETECTED Final   Klebsiella pneumoniae NOT DETECTED NOT DETECTED Final   Proteus species NOT DETECTED NOT DETECTED Final   Salmonella species NOT DETECTED NOT DETECTED Final   Serratia marcescens NOT DETECTED NOT DETECTED Final   Haemophilus influenzae NOT DETECTED NOT DETECTED Final   Neisseria meningitidis NOT DETECTED NOT DETECTED Final   Pseudomonas aeruginosa DETECTED (A) NOT DETECTED Final    Comment: CRITICAL RESULT CALLED TO, READ BACK BY AND VERIFIED WITH: PHARMD A WOLFE [id number] 0954 T SAAD    Stenotrophomonas maltophilia NOT DETECTED NOT DETECTED Final   Candida albicans NOT DETECTED NOT DETECTED Final   Candida auris NOT DETECTED NOT DETECTED Final   Candida glabrata NOT DETECTED NOT DETECTED Final   Candida krusei NOT DETECTED NOT DETECTED Final   Candida parapsilosis NOT DETECTED NOT DETECTED Final   Candida tropicalis NOT DETECTED NOT DETECTED Final   Cryptococcus neoformans/gattii NOT DETECTED NOT DETECTED Final   CTX-M ESBL NOT DETECTED NOT DETECTED Final   Carbapenem resistance IMP NOT DETECTED NOT DETECTED Final   Carbapenem resistance KPC NOT DETECTED NOT DETECTED Final   Carbapenem resistance NDM NOT DETECTED NOT DETECTED Final   Carbapenem resist OXA 48 LIKE NOT DETECTED NOT DETECTED Final   Carbapenem resistance VIM NOT DETECTED NOT DETECTED Final    Comment: Performed at [facility], [address] [place], [place] [address]     [name], NP [facility]  04/21/2020  2:31 PM

## 2020-04-22 MED ORDER — OXYCODONE HCL 20 MG PO TABS
20.0000 mg | ORAL_TABLET | Freq: Four times a day (QID) | ORAL | 0 refills | Status: AC
Start: 2020-04-22 — End: 2020-04-29

## 2020-04-22 NOTE — Progress Notes (Signed)
   Subjective:   No acute events overnight. Zaul was fully dressed and ready to go home. He plans to go to his dad's house at discharge.   He is interested in coming to OUD clinic after he weans off of pain medication. We will confirm with ortho to have final stitch in his arm removed.   Wants pain medication sent to [facility] on [address]. All of his other questions were addressed.   Objective:  Vital signs in last 24 hours: Vitals:   04/20/20 1927 04/21/20 0300 04/21/20 1936 04/22/20 0300  BP: (!) 97/59 (!) 102/59 97/63 100/62  Pulse: 87 64 68 66  Resp: 17 16 18 16   Temp: 98.1 F (36.7 C) 97.8 F (36.6 C) 98.2 F (36.8 C) 98 F (36.7 C)  TempSrc: Oral Oral Oral Oral  SpO2: 99% 99% 100% 100%  Weight:      Height:       General: awake, alert, sitting comfortably in bed eating breakfast CV: RRR; SEM  Ext: swelling and redness continue to improve in LUE. R AC dressing loose, surgical site clean, dry, non-erythematous, single suture remaining  Assessment/Plan:  Principal Problem:   MRSA bacteremia Active Problems:   Abscess of forearm, right   IVDU (intravenous drug user)   Opioid use disorder, severe, dependence (HCC)   COVID-19   Endocarditis of tricuspid valve   Postprocedural fever  Mr. Gulyas is a 34 year old person who uses IV drugs(heroin), history of MRSA/Candida glabrata bacteremia in 03/2019 (subsequent 3-week hospital stay), major depressive disorder with psychosis, hepatitis C virus (untreated)admitted with 3-day history of swelling of the right forearm with associated pain and erythemaand found to have R antecubital abscess c/b MRSA bacteremia and positive forSARS-CoV-2.Found to have bacterial endocarditis. Now with polymicrobial gram negative bacteremia.  He is on hospital day 21 and doing well. Anticipate discharge today.  MRSA bacteremia 2/2 to Rt antecubital abscess s/p I&D (8/4) and complicated by tricuspid valve endocarditis Patient is appropriate  to continue his treatment of endocarditis with PO linezolid (Zyvox) until 9/7 for a total of 4 weeks with ID follow-up to decide whether he will require 2 additional weeks to complete a 6-week course. Patient is ready for discharge from ID standpoint. - Continue PO linezolid (Zyvox) until ID follow-up appt on 9/7 - Appreciate ID's assitance  - Single remaining suture removed prior to discharge. Patient will follow-up with ortho in 2-3 weeks. - 1-week of oxy at discharge for pain control  Polymicrobial gram negative bacteremia 2/2 to infiltrated IV - No fevers overnight and WBC is wnl - On day 7 of Levofloxacin, will continue until ID follow-up on 9/7   Dispo: Anticipate discharge today. TOC pharmacy to deliver medications to bedside. TOC also working on helping patient find PCP, discuss sobriety resources. Will schedule first available suboxone clinic appointment for patient in September.  October, MD 04/22/2020, 7:49 AM Pager: [phone] After 5pm on weekdays and 1pm on weekends: On Call pager [phone]

## 2020-04-22 NOTE — Progress Notes (Signed)
Discharge instructions addressed ;pt in stable condition;Family member waiting at [employer] as pt.'s ride.

## 2020-04-29 ENCOUNTER — Encounter (HOSPITAL_COMMUNITY): Payer: Self-pay | Admitting: Emergency Medicine

## 2020-04-29 ENCOUNTER — Emergency Department (HOSPITAL_COMMUNITY)
Admission: EM | Admit: 2020-04-29 | Discharge: 2020-04-29 | Disposition: A | Discharge status: Home / Self Care | Payer: Self-pay | Attending: Emergency Medicine | Admitting: Emergency Medicine

## 2020-04-29 ENCOUNTER — Emergency Department (HOSPITAL_COMMUNITY): Payer: Self-pay

## 2020-04-29 ENCOUNTER — Other Ambulatory Visit: Payer: Self-pay

## 2020-04-29 DIAGNOSIS — R0602 Shortness of breath: Secondary | ICD-10-CM | POA: Insufficient documentation

## 2020-04-29 DIAGNOSIS — Z5321 Procedure and treatment not carried out due to patient leaving prior to being seen by health care provider: Secondary | ICD-10-CM | POA: Insufficient documentation

## 2020-04-29 DIAGNOSIS — R0789 Other chest pain: Secondary | ICD-10-CM | POA: Insufficient documentation

## 2020-04-29 DIAGNOSIS — R519 Headache, unspecified: Secondary | ICD-10-CM | POA: Insufficient documentation

## 2020-04-29 LAB — CBC
HCT: 27.7 % — ABNORMAL LOW (ref 39.0–52.0)
Hemoglobin: 8.9 g/dL — ABNORMAL LOW (ref 13.0–17.0)
MCH: 26.9 pg (ref 26.0–34.0)
MCHC: 32.1 g/dL (ref 30.0–36.0)
MCV: 83.7 fL (ref 80.0–100.0)
Platelets: 140 10*3/uL — ABNORMAL LOW (ref 150–400)
RBC: 3.31 MIL/uL — ABNORMAL LOW (ref 4.22–5.81)
RDW: 14.1 % (ref 11.5–15.5)
WBC: 9.6 10*3/uL (ref 4.0–10.5)
nRBC: 0 % (ref 0.0–0.2)

## 2020-04-29 LAB — BASIC METABOLIC PANEL
Anion gap: 9 (ref 5–15)
BUN: 9 mg/dL (ref 6–20)
CO2: 22 mmol/L (ref 22–32)
Calcium: 8.5 mg/dL — ABNORMAL LOW (ref 8.9–10.3)
Chloride: 105 mmol/L (ref 98–111)
Creatinine, Ser: 1.13 mg/dL (ref 0.61–1.24)
GFR calc Af Amer: >60 mL/min (ref >60)
GFR calc non Af Amer: >60 mL/min (ref >60)
Glucose, Bld: 100 mg/dL — ABNORMAL HIGH (ref 70–99)
Potassium: 3.3 mmol/L — ABNORMAL LOW (ref 3.5–5.1)
Sodium: 136 mmol/L (ref 135–145)

## 2020-04-29 LAB — TROPONIN I (HIGH SENSITIVITY)
Troponin I (High Sensitivity): 6 ng/L (ref <18)
Troponin I (High Sensitivity): 4 ng/L (ref <18)

## 2020-04-29 NOTE — ED Notes (Signed)
Called pt multiple times, no answer

## 2020-04-29 NOTE — ED Notes (Signed)
Gave pt cookies

## 2020-04-29 NOTE — ED Triage Notes (Signed)
Patient here with headache and chest pain with mild shortness of breath.  Patient was recently discharged about one week ago after being admitted for abscess after IVDU.

## 2020-04-30 ENCOUNTER — Encounter (HOSPITAL_COMMUNITY): Payer: Self-pay

## 2020-04-30 ENCOUNTER — Inpatient Hospital Stay (HOSPITAL_COMMUNITY): Payer: Self-pay

## 2020-04-30 ENCOUNTER — Emergency Department (HOSPITAL_COMMUNITY): Payer: Self-pay

## 2020-04-30 ENCOUNTER — Emergency Department: Payer: Self-pay

## 2020-04-30 ENCOUNTER — Inpatient Hospital Stay (HOSPITAL_COMMUNITY)
Admission: EM | Admit: 2020-04-30 | Discharge: 2020-05-05 | DRG: 872 | Disposition: A | Discharge status: Home / Self Care | Payer: Self-pay | Attending: Internal Medicine | Admitting: Internal Medicine

## 2020-04-30 DIAGNOSIS — L02413 Cutaneous abscess of right upper limb: Secondary | ICD-10-CM | POA: Diagnosis present

## 2020-04-30 DIAGNOSIS — Z86718 Personal history of other venous thrombosis and embolism: Secondary | ICD-10-CM

## 2020-04-30 DIAGNOSIS — Z886 Allergy status to analgesic agent status: Secondary | ICD-10-CM

## 2020-04-30 DIAGNOSIS — B379 Candidiasis, unspecified: Secondary | ICD-10-CM | POA: Diagnosis present

## 2020-04-30 DIAGNOSIS — I959 Hypotension, unspecified: Secondary | ICD-10-CM | POA: Diagnosis present

## 2020-04-30 DIAGNOSIS — I330 Acute and subacute infective endocarditis: Secondary | ICD-10-CM

## 2020-04-30 DIAGNOSIS — R000 Tachycardia, unspecified: Secondary | ICD-10-CM | POA: Diagnosis present

## 2020-04-30 DIAGNOSIS — Z8679 Personal history of other diseases of the circulatory system: Secondary | ICD-10-CM

## 2020-04-30 DIAGNOSIS — A4102 Sepsis due to Methicillin resistant Staphylococcus aureus: Principal | ICD-10-CM | POA: Diagnosis present

## 2020-04-30 DIAGNOSIS — R7401 Elevation of levels of liver transaminase levels: Secondary | ICD-10-CM

## 2020-04-30 DIAGNOSIS — Z79899 Other long term (current) drug therapy: Secondary | ICD-10-CM

## 2020-04-30 DIAGNOSIS — Z8616 Personal history of COVID-19: Secondary | ICD-10-CM

## 2020-04-30 DIAGNOSIS — F17210 Nicotine dependence, cigarettes, uncomplicated: Secondary | ICD-10-CM | POA: Diagnosis present

## 2020-04-30 DIAGNOSIS — F1990 Other psychoactive substance use, unspecified, uncomplicated: Secondary | ICD-10-CM | POA: Diagnosis present

## 2020-04-30 DIAGNOSIS — N179 Acute kidney failure, unspecified: Secondary | ICD-10-CM | POA: Diagnosis present

## 2020-04-30 DIAGNOSIS — F329 Major depressive disorder, single episode, unspecified: Secondary | ICD-10-CM | POA: Diagnosis present

## 2020-04-30 DIAGNOSIS — Z87898 Personal history of other specified conditions: Secondary | ICD-10-CM

## 2020-04-30 DIAGNOSIS — E876 Hypokalemia: Secondary | ICD-10-CM | POA: Diagnosis present

## 2020-04-30 DIAGNOSIS — A419 Sepsis, unspecified organism: Secondary | ICD-10-CM | POA: Diagnosis present

## 2020-04-30 DIAGNOSIS — I809 Phlebitis and thrombophlebitis of unspecified site: Secondary | ICD-10-CM | POA: Diagnosis not present

## 2020-04-30 DIAGNOSIS — R079 Chest pain, unspecified: Secondary | ICD-10-CM | POA: Diagnosis present

## 2020-04-30 DIAGNOSIS — I38 Endocarditis, valve unspecified: Secondary | ICD-10-CM

## 2020-04-30 DIAGNOSIS — F1120 Opioid dependence, uncomplicated: Secondary | ICD-10-CM | POA: Diagnosis present

## 2020-04-30 DIAGNOSIS — Z885 Allergy status to narcotic agent status: Secondary | ICD-10-CM

## 2020-04-30 DIAGNOSIS — M791 Myalgia, unspecified site: Secondary | ICD-10-CM | POA: Diagnosis present

## 2020-04-30 DIAGNOSIS — D696 Thrombocytopenia, unspecified: Secondary | ICD-10-CM | POA: Diagnosis present

## 2020-04-30 DIAGNOSIS — B182 Chronic viral hepatitis C: Secondary | ICD-10-CM | POA: Diagnosis present

## 2020-04-30 DIAGNOSIS — I079 Rheumatic tricuspid valve disease, unspecified: Secondary | ICD-10-CM | POA: Diagnosis present

## 2020-04-30 LAB — TROPONIN I (HIGH SENSITIVITY)
Troponin I (High Sensitivity): 10 ng/L (ref <18)
Troponin I (High Sensitivity): 9 ng/L (ref <18)

## 2020-04-30 LAB — URINALYSIS, ROUTINE W REFLEX MICROSCOPIC
Bacteria, UA: NONE SEEN
Bilirubin Urine: NEGATIVE
Glucose, UA: NEGATIVE mg/dL
Hgb urine dipstick: NEGATIVE
Ketones, ur: NEGATIVE mg/dL
Nitrite: NEGATIVE
Protein, ur: 30 mg/dL — AB
Specific Gravity, Urine: 1.029 (ref 1.005–1.030)
WBC, UA: >50 WBC/hpf — ABNORMAL HIGH (ref 0–5)
pH: 5 (ref 5.0–8.0)

## 2020-04-30 LAB — COMPREHENSIVE METABOLIC PANEL
ALT: 56 U/L — ABNORMAL HIGH (ref 0–44)
AST: 89 U/L — ABNORMAL HIGH (ref 15–41)
Albumin: 3.2 g/dL — ABNORMAL LOW (ref 3.5–5.0)
Alkaline Phosphatase: 67 U/L (ref 38–126)
Anion gap: 9 (ref 5–15)
BUN: 9 mg/dL (ref 6–20)
CO2: 23 mmol/L (ref 22–32)
Calcium: 8.7 mg/dL — ABNORMAL LOW (ref 8.9–10.3)
Chloride: 104 mmol/L (ref 98–111)
Creatinine, Ser: 1.29 mg/dL — ABNORMAL HIGH (ref 0.61–1.24)
GFR calc Af Amer: >60 mL/min (ref >60)
GFR calc non Af Amer: >60 mL/min (ref >60)
Glucose, Bld: 92 mg/dL (ref 70–99)
Potassium: 3 mmol/L — ABNORMAL LOW (ref 3.5–5.1)
Sodium: 136 mmol/L (ref 135–145)
Total Bilirubin: 0.9 mg/dL (ref 0.3–1.2)
Total Protein: 7.3 g/dL (ref 6.5–8.1)

## 2020-04-30 LAB — CBC WITH DIFFERENTIAL/PLATELET
Abs Immature Granulocytes: 0.04 10*3/uL (ref 0.00–0.07)
Basophils Absolute: 0.1 10*3/uL (ref 0.0–0.1)
Basophils Relative: 0 %
Eosinophils Absolute: 0.3 10*3/uL (ref 0.0–0.5)
Eosinophils Relative: 2 %
HCT: 28.9 % — ABNORMAL LOW (ref 39.0–52.0)
Hemoglobin: 9.1 g/dL — ABNORMAL LOW (ref 13.0–17.0)
Immature Granulocytes: 0 %
Lymphocytes Relative: 20 %
Lymphs Abs: 2.4 10*3/uL (ref 0.7–4.0)
MCH: 26.1 pg (ref 26.0–34.0)
MCHC: 31.5 g/dL (ref 30.0–36.0)
MCV: 82.8 fL (ref 80.0–100.0)
Monocytes Absolute: 0.8 10*3/uL (ref 0.1–1.0)
Monocytes Relative: 6 %
Neutro Abs: 8.6 10*3/uL — ABNORMAL HIGH (ref 1.7–7.7)
Neutrophils Relative %: 72 %
Platelets: 136 10*3/uL — ABNORMAL LOW (ref 150–400)
RBC: 3.49 MIL/uL — ABNORMAL LOW (ref 4.22–5.81)
RDW: 14.2 % (ref 11.5–15.5)
WBC: 12.2 10*3/uL — ABNORMAL HIGH (ref 4.0–10.5)
nRBC: 0 % (ref 0.0–0.2)

## 2020-04-30 LAB — ECHOCARDIOGRAM COMPLETE
Area-P 1/2: 3.53 cm2
S' Lateral: 3.2 cm

## 2020-04-30 LAB — LACTIC ACID, PLASMA
Lactic Acid, Venous: 2.2 mmol/L (ref 0.5–1.9)
Lactic Acid, Venous: 2 mmol/L (ref 0.5–1.9)

## 2020-04-30 MED ORDER — ACETAMINOPHEN 650 MG RE SUPP: 650.0000 mg | Freq: Four times a day (QID) | RECTAL | Status: DC | PRN

## 2020-04-30 MED ORDER — LACTATED RINGERS IV BOLUS (SEPSIS)
1000.0000 mL | Freq: Once | INTRAVENOUS | Status: AC
  Administered 2020-04-30: 1000 mL via INTRAVENOUS

## 2020-04-30 MED ORDER — LACTATED RINGERS IV BOLUS (SEPSIS)
500.0000 mL | Freq: Once | INTRAVENOUS | Status: AC
  Administered 2020-04-30: 500 mL via INTRAVENOUS

## 2020-04-30 MED ORDER — HEPARIN SODIUM (PORCINE) 5000 UNIT/ML IJ SOLN
5000.0000 [IU] | Freq: Three times a day (TID) | INTRAMUSCULAR | Status: DC
  Filled 2020-04-30 (×5): qty 1

## 2020-04-30 MED ORDER — POTASSIUM CHLORIDE CRYS ER 20 MEQ PO TBCR
40.0000 meq | EXTENDED_RELEASE_TABLET | Freq: Once | ORAL | Status: AC
  Administered 2020-04-30: 40 meq via ORAL
  Filled 2020-04-30: qty 2

## 2020-04-30 MED ORDER — DULOXETINE HCL 30 MG PO CPEP
30.0000 mg | ORAL_CAPSULE | Freq: Every day | ORAL | Status: DC
  Administered 2020-04-30 – 2020-05-05 (×6): 30 mg via ORAL
  Filled 2020-04-30 (×7): qty 1

## 2020-04-30 MED ORDER — SODIUM CHLORIDE 0.9 % IV SOLN
1.0000 g | Freq: Three times a day (TID) | INTRAVENOUS | Status: DC
  Administered 2020-04-30 – 2020-05-05 (×16): 1 g via INTRAVENOUS
  Filled 2020-04-30 (×18): qty 1

## 2020-04-30 MED ORDER — LACTATED RINGERS IV SOLN: INTRAVENOUS | Status: AC

## 2020-04-30 MED ORDER — ACETAMINOPHEN 325 MG PO TABS
650.0000 mg | ORAL_TABLET | Freq: Four times a day (QID) | ORAL | Status: DC | PRN
  Administered 2020-04-30 – 2020-05-04 (×3): 650 mg via ORAL
  Filled 2020-04-30 (×4): qty 2

## 2020-04-30 MED ORDER — OXYCODONE HCL 5 MG PO TABS
10.0000 mg | ORAL_TABLET | Freq: Four times a day (QID) | ORAL | Status: DC | PRN
  Administered 2020-04-30 – 2020-05-02 (×7): 10 mg via ORAL
  Filled 2020-04-30 (×7): qty 2

## 2020-04-30 MED ORDER — VANCOMYCIN HCL 750 MG/150ML IV SOLN
750.0000 mg | Freq: Three times a day (TID) | INTRAVENOUS | Status: DC
  Administered 2020-04-30 – 2020-05-01 (×3): 750 mg via INTRAVENOUS
  Filled 2020-04-30 (×3): qty 150

## 2020-04-30 MED ORDER — VANCOMYCIN HCL 1500 MG/300ML IV SOLN
1500.0000 mg | Freq: Once | INTRAVENOUS | Status: AC
  Administered 2020-04-30: 1500 mg via INTRAVENOUS
  Filled 2020-04-30: qty 300

## 2020-04-30 NOTE — ED Notes (Signed)
Pt is a hard stick, one blood culture was obtained & another attempt was made for the second culture. IV team has been consulted for a midline, blood will be requested at insertion time for second culture.

## 2020-04-30 NOTE — ED Notes (Signed)
Meropenum was started with NS in the pt's only access, so his LR boluses was paused, his BP is supportive of this at this time.

## 2020-04-30 NOTE — H&P (Signed)
Date: 04/30/2020               Patient Name:  Jon Castro MRN: [id number]  DOB: 1986-05-29 Age / Sex: 34 y.o., male   PCP: Patient, [name] Per         Medical Service: Internal Medicine Teaching Service         Attending Physician: Dr. Inez Catalina, MD    First Contact: Dr. Morrie Sheldon Pager: [phone]  Second Contact: Dr. Marchia Bond Pager: [phone]       After Hours (After 5p/  First Contact Pager: [phone]  weekends / holidays): Second Contact Pager: [phone]   Chief Complaint: Chest pain, SOB, chills  History of Present Illness: This is a 34 year old person who uses IV drugs(heroin), history of MRSA bacteremia, candidemia, major depressive disorder with psychosis, untreated hepatitis C who was recently admitted for MRSA bacteremia secondary to right antecubital abscess status post I&D complicated by tricuspid valve endocarditis, polymicrobial gram-negative bacteremia, asymptomatic Covid, and acute superficial vein thrombosis presenting for a 3 to 4-day history of chest pain, shortness of breath, chills, and headaches.  He reports that when he left the hospital he was feeling well and then about 3 days after leaving the hospital started having episodes of chest pain.  He reports the pain was from his heart, pointed to the middle of his chest, is sharp in nature, he is unable to tell if it changes with activity or with rest, seems to occur sporadically.  He is not sure of other symptoms accompany it however he also has been having some nausea and shortness of breath.  He reports dyspnea on exertion and decreased appetite.  Also endorses a headache, reports it as "regular", all over his head, no specific location, currently 6-7 out of 10, denies any photophobia, phonophobia, vision changes, vomiting, fevers, chills, weakness, numbness, or new neurological deficits.  Has not tried anything for the headache or the chest pain.  Denies any chest pain at this moment.  Reports that his arm is  still having a little bit of pain however feels that it is improving.  He reports taking his medications as prescribed, denies missing any doses.  He feels that his infection is not improving with antibiotics.  He reports he has not had any drug use or smoking since his discharge.  Patient was admitted from 8/3-8/25 for MRSA bacteremia secondary to right antecubital abscess status post I&D and complicated by tricuspid valve endocarditis, polymicrobial gram-negative bacteremia, and acute superficial vein thrombosis involving the left cephalic vein, and positive for SARS-Cov-2.  Initially treated with IV vancomycin, TTE was negative, TEE showed vegetation on the tricuspid valve.  He continued to be febrile despite antibiotics and repeat blood cultures showed multiple gram-negative species including A.calcoaceticus-baumannii,Enterobacterales,Enterobacter cloacae complex, andPseudomonas aeruginosa that was suspect to be secondary to infiltrated IV. Patient was discharged with linezolid and levofloxacin for his endocarditis and gram-negative bacteremia, short course of oxycodone, also advised to follow-up with the OUD clinic for his OUD and [facility] for wound check.  In the ED patient was noted to be afebrile, tachycardic, and hypotensive down to 87/47.  Labs significant for WBC 12.2, hemoglobin 9.1, platelets 130, lactic acid 2, troponin 9, potassium 3.0, creatinine 1.29(baseline around 0.8), mildly elevated AST and ALT of 89 and 56.  His EKG showed sinus tachycardia, otherwise unchanged from prior.  Chest x-ray showed no acute abnormalities.  EDP contacted ID and pharmacy for antibiotic recommendations, started on IV Vanco  and meropenem.  Patient started on IV fluids with improvement in his blood pressure.  Patient admitted to [facility] for further management.   Meds:  Linezolid Levaquin Duloxetine Oxycodone  Allergies: Allergies as of 04/30/2020 - Review Complete 04/30/2020  Allergen Reaction Noted  .  Codeine Nausea And Vomiting 12/28/2011  . Hydrocodone Itching 04/30/2020  . Tramadol Other (See Comments) 09/25/2011   Past Medical History:  Diagnosis Date  . Back pain   . Cellulitis 10/13/2019  . Drug abuse (HCC)   . Fungemia 05/02/2019  . IV drug abuse (HCC)   . IVDU (intravenous drug user)   . LFTs abnormal   . MRSA bacteremia 04/29/2019  . Overdose of heroin, accidental or unintentional, initial encounter (HCC) 05/31/2019  . Suicidal ideation 10/30/2019   Family History: Patient is unsure of family history.  Social History: Currently unhomed for the past year.  Reports current IV drug use, no use since last admission.  Denies any alcohol, marijuana, or cocaine use.  Denies any smoking since last admission.  Review of Systems: A complete ROS was negative except as per HPI.   Physical Exam: Blood pressure (!) 96/58, pulse 80, temperature 99.2 F (37.3 C), resp. rate 18, SpO2 100 %. Physical Exam Constitutional:      Comments: Unsheveled appearing  Cardiovascular:     Rate and Rhythm: Normal rate and regular rhythm.     Heart sounds: Murmur heard.  Systolic murmur is present with a grade of 2/6.   Pulmonary:     Effort: Pulmonary effort is normal.     Breath sounds: Normal breath sounds.  Chest:     Chest wall: No mass.  Abdominal:     General: Bowel sounds are normal.     Palpations: Abdomen is soft.  Musculoskeletal:        General: Normal range of motion.     Cervical back: Normal range of motion and neck supple.  Skin:    General: Skin is warm and dry.     Capillary Refill: Capillary refill takes less than 2 seconds.     Comments: RUQ with well healed surgical wound of prior right AC abscess  Neurological:     General: No focal deficit present.     Mental Status: He is alert and oriented to person, place, and time.     Cranial Nerves: No cranial nerve deficit.     Motor: No weakness.  Psychiatric:        Mood and Affect: Mood normal.     Comments: Irritable      EKG: personally reviewed my interpretation is sinus tachycardia, no acute ST changes  CXR: personally reviewed my interpretation is no acute cardiopulmonary disease.   Assessment & Plan by Problem: Active Problems:   Sepsis (HCC)  This is a 34 year old male who uses IV drugs, history of MRSA bacteremia, candidemia, major depressive disorder with psychosis, and untreated hepatitis C, who was recently admitted for MRSA bacteremia secondary to right antecubital abscess status post I&D complicated by tricuspid valve endocarditis, polymicrobial gram-negative bacteremia, asymptomatic Covid, and acute superficial vein thrombosis presenting for a 3 to 4-day history of chest pain, shortness of breath, chills, and headaches.   Sepsis likely 2/2 bacteremia: Patient presenting with chest pain, shortness of breath, headaches, decreased appetite, and feeling generally unwell. He reports compliance with the linezolid and Levaquin.  Denies any additional IV drug use.  Noted to be septic, with hypotension, leukocytosis of 12 and mildly elevated lactate of 2.  Blood  pressure down to 87/47 with improvement to 96/58 with fluid resuscitation.,  Received 2.5L LR.  -His right upper extremity wound appears to be well-healing, no erythema, redness, or drainage noted.  Seems unlikely that this will be the source of his infection. -Urinalysis showed moderate leukocytes, no nitrates, no bacteria, and >50 WBCs.  No urinary symptoms noted. -He does endorse some headache however neurological exam is unremarkable and he denies any focal neurological deficits so brain abscess seems less likely. -Patient has had difficulty with IV access due to his history of right antecubital abscess and left superficial vein thrombosis.  Currently getting a PICC line placed for IV antibiotics. Patient is high risk given his history of IVDU however given potential oral antibiotic failure will need likely IV antibiotics for  treatment.  -Repeating blood cultures -ID consulted, appreciate recommendations -Vancomycin and meropenem -Trend CBC -Continue maintenance fluids -Repeat echocardiogram  Chest pain: Etiology for chest pain is unclear at this time, difficult to obtain history however does not seem to worsen with activity or improved with movements, seems more atypical in nature.  No chest pain at this time.  EKG showed no acute changes, just sinus tachycardia.  Troponin has been flat at 10>9.  We will continue to monitor and repeat EKG and troponin if chest pain reoccurs.  Shortness of breath: Recent asymptomatic + Covid: Patient reports some shortness of breath with exertion, none at rest.  He denies any cough or wheezing.  His chest x-ray shows no acute cardiopulmonary findings.  On exam he is currently on room air and lungs are CTA BL.  Does not appear fluid overloaded on exam.  Does have a history of recent asymptomatic Covid infection however would expect shortness of breath at rest and findings on chest x-ray of the specific cause.  Will hold off on treatment for now.  AKI Creatinine 1.29, up from baseline of around 0.8.  He does endorse some decreased p.o. intake and he currently is hypotensive secondary to his sepsis.  Likely prerenal.  Giving fluids and repeating BMP in a.m.  -Continue maintenance fluids, LR 100 cc/hr -BMP in a.m.  Hypokalemia Potassium down to 3.0, he reports some decreased p.o. intake.  EKG showed no ST or T wave changes.  Will replete and repeat BMP in a.m.  -Kdur 40 mEq once -BMP in a.m.  Thrombocytopenia Platelets down to 130 from 252 about 1 week ago, no evidence of bruising or bleeding on exam, denies any symptoms of bleeding.  Could be related to his linezolid.  We will continue to monitor for symptoms and repeat CBC in a.m. -CBC in a.m.  Transaminitis AST 89 and ALT 56, mildly increased from AST 44 and ALT 62 on last admission. Has a history of untreated hepatitis C  which could be the cause. Will monitor for now.   Substance abuse OUD Denies any recent heroin use since discharge.  Will need to continue talking about OUD clinic with patient and encouraged cessation.  Tobacco use Denies any smoking since discharge.  Can place nicotine patch if needed.  Untreated hepatitis C Patient will need treatment in the future.  Major depressive disorder History of multiple suicidal attempts -Continue home duloxetine -[facility] benefit from [facility] outpatient  FEN: LR 100cc/hr, replete lytes prn, HH diet  VTE ppx: Heparin Code Status: FULL     Dispo: Admit patient to Inpatient with expected length of stay greater than 2 midnights.  Signed: Claudean Severance, MD 04/30/2020, 10:01 AM  Pager: [phone] After  5pm on weekdays and 1pm on weekends: On Call pager: [phone]

## 2020-04-30 NOTE — Progress Notes (Signed)
Consulted to place second PIV.  Unable to use right arm due to infection.  Left arm assessed with ultrasound and unable to find any suitable vein.  Veins present are scared and non compressable. Floor RN Renee notified.

## 2020-04-30 NOTE — ED Provider Notes (Signed)
[facility] EMERGENCY DEPARTMENT Provider Note   CSN: [id number] Arrival date & time: 04/30/20  4098     History Chief Complaint  Patient presents with  . Chest Pain    Jon Castro is a 34 y.o. male with a history of IVDU with recent admission for MRSA bacteremia secondary to R antecubital abscess S/p I&D complicated by tricuspid valve endocarditis (08/03-08/25) who returns to the ED with complaints of chest pain over the past 4-5 days. Patient states he was feeling okay when he left the hospital 04/22/20, however a few days later he developed intermittent chest pain described as sharp lasting a few minutes at a time, dyspnea, palpitations, intermittent lightheadedness, & chills. Also mentions headache (gradual onset, steady progression, generalized).  No alleviating/aggravating factors. He states he was not having the cardiopulmonary sxs during his admission and that these are new. He was found to have polymicrobial gram negative bacteria. Discharged home on Linezolid & levaquin for 15 days with plans for follow up with infectious disease, 09/07, he has been taking abx as prescribed & has not used IV drugs since hospitalization. He feels he needs to be re-admitted. He denies fever, vomiting, abdominal pain, visual disturbance, numbness, or weakness. His R abscess S/p I&D site has been doing well.   HPI     Past Medical History:  Diagnosis Date  . Back pain   . Cellulitis 10/13/2019  . Drug abuse (HCC)   . Fungemia 05/02/2019  . IV drug abuse (HCC)   . IVDU (intravenous drug user)   . LFTs abnormal   . MRSA bacteremia 04/29/2019  . Overdose of heroin, accidental or unintentional, initial encounter (HCC) 05/31/2019  . Suicidal ideation 10/30/2019    Patient Active Problem List   Diagnosis Date Noted  . Endocarditis of tricuspid valve 04/14/2020  . Postprocedural fever 04/14/2020  . COVID-19   . MRSA bacteremia 04/01/2020  . Chronic hepatitis C (HCC)  09/29/2019  . IVDU (intravenous drug user) 04/29/2019  . Abscess of forearm, right 04/28/2019  . Opioid use disorder, severe, dependence (HCC) 03/05/2019  . MDD (major depressive disorder), severe (HCC) 02/07/2019    Past Surgical History:  Procedure Laterality Date  . I & D EXTREMITY Right 04/28/2019   Procedure: IRRIGATION AND DEBRIDEMENT ARM ABSCESS;  Surgeon: Sheral Apley, MD;  Location: [facility] OR;  Service: Orthopedics;  Laterality: Right;  . I & D EXTREMITY Right 04/01/2020   Procedure: IRRIGATION AND DEBRIDEMENT OF ELBOW;  Surgeon: Bradly Bienenstock, MD;  Location: [facility] OR;  Service: Orthopedics;  Laterality: Right;  . TEE WITHOUT CARDIOVERSION N/A 05/03/2019   Procedure: TRANSESOPHAGEAL ECHOCARDIOGRAM (TEE);  Surgeon: Jake Bathe, MD;  Location[facility] ENDOSCOPY;  Service: Cardiovascular;  Laterality: N/A;  . TEE WITHOUT CARDIOVERSION N/A 04/14/2020   Procedure: TRANSESOPHAGEAL ECHOCARDIOGRAM (TEE);  Surgeon: Jodelle Red, MD;  Location: [facility] ENDOSCOPY;  Service: Cardiovascular;  Laterality: N/A;       No family history on file.  Social History   Tobacco Use  . Smoking status: Current Every Day Smoker    Packs/day: 0.50    Types: Cigarettes  . Smokeless tobacco: Never Used  Vaping Use  . Vaping Use: Never used  Substance Use Topics  . Alcohol use: Yes  . Drug use: Yes    Types: IV, Cocaine, Amphetamines, Marijuana, Heroin, Methamphetamines    Comment: HEROIN    Home Medications Prior to Admission medications   Medication Sig Start Date End Date Taking? Authorizing Provider  DULoxetine (CYMBALTA)  30 MG capsule Take 1 capsule (30 mg total) by mouth daily. Patient not taking: Reported on 01/02/2020 11/04/19   Aldean Baker, NP  levofloxacin (LEVAQUIN) 750 MG tablet Take 1 tablet (750 mg total) by mouth daily for 15 days. 04/21/20 05/06/20  Inez Catalina, MD  linezolid (ZYVOX) 600 MG tablet Take 1 tablet (600 mg total) by mouth every 12 (twelve) hours for 15 days. 04/21/20  05/06/20  Inez Catalina, MD  nicotine (NICODERM CQ - DOSED IN MG/24 HOURS) 14 mg/24hr patch Place 1 patch (14 mg total) onto the skin daily. Patient not taking: Reported on 01/02/2020 11/04/19   Aldean Baker, NP  cloNIDine (CATAPRES) 0.1 MG tablet Take 1 tablet (0.1 mg total) by mouth 3 (three) times daily for 5 days. Patient not taking: Reported on 03/24/2019 03/08/19 03/24/19  Virgina Norfolk, DO  traZODone (DESYREL) 50 MG tablet Take 1 tablet (50 mg total) by mouth at bedtime as needed for sleep. Patient not taking: Reported on 02/21/2019 02/11/19 03/24/19  Aldean Baker, NP    Allergies    Vicodin [hydrocodone-acetaminophen], Codeine, Codeine, Tramadol, Tramadol, and Vicodin [hydrocodone-acetaminophen]  Review of Systems   Review of Systems  Constitutional: Positive for chills. Negative for fever.  Respiratory: Positive for shortness of breath. Negative for cough.   Cardiovascular: Positive for chest pain and palpitations. Negative for leg swelling.  Gastrointestinal: Negative for abdominal pain, blood in stool and vomiting.  Neurological: Positive for light-headedness and headaches. Negative for seizures, speech difficulty, weakness and numbness.  All other systems reviewed and are negative.   Physical Exam Updated Vital Signs BP (!) 87/47 (BP Location: Right Arm)   Pulse 100   Temp 99.2 F (37.3 C)   Resp 17   SpO2 100%   Physical Exam Vitals and nursing note reviewed.  Constitutional:      General: He is not in acute distress.    Appearance: He is well-developed. He is not toxic-appearing.  HENT:     Head: Normocephalic and atraumatic.  Eyes:     General:        Right eye: No discharge.        Left eye: No discharge.     Conjunctiva/sclera: Conjunctivae normal.  Cardiovascular:     Rate and Rhythm: Normal rate and regular rhythm.     Heart sounds: Murmur heard.   Pulmonary:     Effort: Pulmonary effort is normal. No respiratory distress.     Breath sounds: Normal  breath sounds. No wheezing, rhonchi or rales.  Abdominal:     General: There is no distension.     Palpations: Abdomen is soft.     Tenderness: There is no abdominal tenderness.  Musculoskeletal:     Cervical back: Neck supple.     Right lower leg: No edema.     Left lower leg: No edema.     Comments: RUE: Appropriately healing scar to the R AC, no surrounding erythema, warmth, tenderness, or fluctuance.   Skin:    General: Skin is warm and dry.     Findings: No rash.  Neurological:     Mental Status: He is alert.     Comments: Clear speech.   Psychiatric:        Behavior: Behavior normal.    ED Results / Procedures / Treatments   Labs (all labs ordered are listed, but only abnormal results are displayed) Labs Reviewed  LACTIC ACID, PLASMA - Abnormal; Notable for the following components:  Result Value   Lactic Acid, Venous 2.2 (*)    All other components within normal limits  LACTIC ACID, PLASMA - Abnormal; Notable for the following components:   Lactic Acid, Venous 2.0 (*)    All other components within normal limits  COMPREHENSIVE METABOLIC PANEL - Abnormal; Notable for the following components:   Potassium 3.0 (*)    Creatinine, Ser 1.29 (*)    Calcium 8.7 (*)    Albumin 3.2 (*)    AST 89 (*)    ALT 56 (*)    All other components within normal limits  CBC WITH DIFFERENTIAL/PLATELET - Abnormal; Notable for the following components:   WBC 12.2 (*)    RBC 3.49 (*)    Hemoglobin 9.1 (*)    HCT 28.9 (*)    Platelets 136 (*)    Neutro Abs 8.6 (*)    All other components within normal limits  URINALYSIS, ROUTINE W REFLEX MICROSCOPIC - Abnormal; Notable for the following components:   Color, Urine AMBER (*)    APPearance HAZY (*)    Protein, ur 30 (*)    Leukocytes,Ua MODERATE (*)    WBC, UA >50 (*)    All other components within normal limits  TROPONIN I (HIGH SENSITIVITY)  TROPONIN I (HIGH SENSITIVITY)    EKG EKG Interpretation  Date/Time:  Thursday  April 30 2020 02:41:32 EDT Ventricular Rate:  105 PR Interval:  130 QRS Duration: 88 QT Interval:  340 QTC Calculation: 449 R Axis:   85 Text Interpretation: Sinus tachycardia Otherwise normal ECG Since last tracing rate faster Confirmed by Jacalyn Lefevre [phone]) on 04/30/2020 7:31:28 AM   Radiology DG Chest 2 View  Result Date: 04/30/2020 CLINICAL DATA:  Chest pain EXAM: CHEST - 2 VIEW COMPARISON:  04/29/2020 FINDINGS: The heart size and mediastinal contours are within normal limits. Both lungs are clear. The visualized skeletal structures are unremarkable. IMPRESSION: No active cardiopulmonary disease. Electronically Signed   By: Sharlet Salina M.D.   On: 04/30/2020 02:53   DG Chest 2 View  Result Date: 04/29/2020 CLINICAL DATA:  Chest pain EXAM: CHEST - 2 VIEW COMPARISON:  None. FINDINGS: The heart size and mediastinal contours are within normal limits. Both lungs are clear. The visualized skeletal structures are unremarkable. IMPRESSION: No active cardiopulmonary disease. Electronically Signed   By: Jonna Clark M.D.   On: 04/29/2020 04:00    Procedures .Critical Care Performed by: Cherly Anderson, PA-C Authorized by: Cherly Anderson, PA-C    CRITICAL CARE Performed by: Harvie Heck   Total critical care time: 30 minutes  Critical care time was exclusive of separately billable procedures and treating other patients.  Critical care was necessary to treat or prevent imminent or life-threatening deterioration.  Critical care was time spent personally by me on the following activities: development of treatment plan with patient and/or surrogate as well as nursing, discussions with consultants, evaluation of patient's response to treatment, examination of patient, obtaining history from patient or surrogate, ordering and performing treatments and interventions, ordering and review of laboratory studies, ordering and review of radiographic studies, pulse  oximetry and re-evaluation of patient's condition.    (including critical care time)  Medications Ordered in ED Medications  lactated ringers bolus 1,000 mL (1,000 mLs Intravenous New Bag/Given 04/30/20 0810)    And  lactated ringers bolus 1,000 mL (1,000 mLs Intravenous New Bag/Given 04/30/20 0811)    And  lactated ringers bolus 500 mL (has no administration in time range)  meropenem (  MERREM) 1 g in sodium chloride 0.9 % 100 mL IVPB (has no administration in time range)  vancomycin (VANCOREADY) IVPB 1500 mg/300 mL (has no administration in time range)  vancomycin (VANCOREADY) IVPB 750 mg/150 mL (has no administration in time range)  potassium chloride SA (KLOR-CON) CR tablet 40 mEq (40 mEq Oral Given 04/30/20 0746)    ED Course  I have reviewed the triage vital signs and the nursing notes.  Pertinent labs & imaging results that were available during my care of the patient were reviewed by me and considered in my medical decision making (see chart for details).  Jon Castro was evaluated in Emergency Department on 04/30/2020 for the symptoms described in the history of present illness. He/she was evaluated in the context of the global COVID-19 pandemic, which necessitated consideration that the patient might be at risk for infection with the SARS-CoV-2 virus that causes COVID-19. Institutional protocols and algorithms that pertain to the evaluation of patients at risk for COVID-19 are in a state of rapid change based on information released by regulatory bodies including the CDC and federal and state organizations. These policies and algorithms were followed during the patient's care in the ED.    MDM Rules/Calculators/A&P                         Patient presents to the ED with complaints of chest pain, dyspnea, & palpitations primarily. He is nontoxic appearing, his vitals are notable for hypotension & intermittent tachycardia. On exam he does have a murmur present. His R AC I&D site  looks well.   Additional history obtained:  Additional history obtained from chart review & nursing note review. Previous records obtained and reviewed- seen by ID during last admission.   Lab Tests:  I reviewed and interpreted labs, which included:  CBC: Mild leukocytosis @ 12.2 with left shift. Mild anemia similar to prior.  CMP: Hypokalemia, mildly worsened renal function.  Troponin: WNL UA: moderate leukocytes & > 50 WBCS, no bacteria present No covid test ordered as patient has been COVID positive in the past 30 days.   Imaging Studies ordered:  CXR ordered per triage, I independently visualized and interpreted imaging which showed No active cardiopulmonary disease.   Patient w/ recent admission found to have bacteremia & tricuspid valve endocarditis, returns w/ cardiopulmonary sxs, found to have tachycardia, hypotension, leukocytosis & mildly elevated lactic acid. Suspect he is not improving & potentially worsening on oral abx outpatient. Code sepsis, 30 cc/kg bolus ordered, abx per pharmacy consult- discussed w/ ED pharmacist, consult placed to ID as well.   PICC line ordered.   09:02: CONSULT: Discussed with Dr. Luciana Axeomer with infectious disease, in agreement w/ abx, would repeat echo for re-assessment.   09:10: BP 96/58- improving with fluids.   09:14: CONSULT: Discussed with internal medicine service- accept admission.   Sepsis - Repeat Assessment Performed at: 9:15 AM Vitals Blood pressure (!) 96/58, pulse 80, temperature 99.2 F (37.3 C), resp. rate 18, SpO2 100 %. Heart:Regular rate and rhythm Lungs:CTA Capillary Refill:<2 sec Peripheral Pulse:Radial pulse palpable Skin: Normal Color  Findings and plan of care discussed with supervising physician Dr. Particia NearingHaviland who has evaluated patient & is in agreement.   Portions of this note were generated with Scientist, clinical (histocompatibility and immunogenetics)Dragon dictation software. Dictation errors may occur despite best attempts at proofreading.  Final Clinical Impression(s) /  ED Diagnoses Final diagnoses:  Chest pain, unspecified type  History of bacteremia  Hypokalemia  History  of endocarditis  Sepsis, due to unspecified organism, unspecified whether acute organ dysfunction present [facility])    Rx / DC Orders ED Discharge Orders    None       Cherly Anderson, PA-C 04/30/20 3474    Jacalyn Lefevre, MD 04/30/20 [phone]

## 2020-04-30 NOTE — Progress Notes (Signed)
  Echocardiogram 2D Echocardiogram has been performed.  Jon Castro 04/30/2020, 11:43 AM

## 2020-04-30 NOTE — Consult Note (Signed)
[facility]    Date of Admission:  04/30/2020     Total days of antibiotics 29               Reason for Consult: Sepsis / Endocarditis  Referring Provider: Particia Nearing Primary Care Provider: Patient, [name] Per   ASSESSMENT:  Jon Castro is a 34 y/o male recently admitted with disseminate MRSA bacteremia complicated by tricuspid valve endocarditis and secondary bacteremia with pseudomonas and acinetobacter readmitted with chest pain dyspnea, general malaise and palpitations. These symptoms are likely sequela of previous disseminated infection and likely burden of infection with possible embolization of previous vegetation. Blood cultures are in process, however he has been taking his levofloxacin and linezolid so yields are likely to be low. TTE completed did not show previous linear density described on TEE so may be related to current symptoms. Agree with broad spectrum coverage with meropenem and vancomycin pending additional work up. Recommend holding PICC line / central line access pending blood cultures to ensure he does not have bacteremia unless medically necessary for treatment. Will likely need central line for treatment given previous abscesses.   PLAN:  1. Continue vancomycin and meropenem.  2. Monitor cultures for bacteremia.  3. Hold central line placement pending blood cultures as able.  4. Monitor fever curve. 5. Therapeutic drug monitoring of renal function and vancomycin levels.    Active Problems:   Sepsis (HCC)   . DULoxetine  30 mg Oral Daily  . heparin  5,000 Units Subcutaneous Q8H     HPI: Jon Castro is a 34 y.o. male with previous medical history of IV drug use and recent hospitalization for disseminated MRSA bacteremia and tricuspid valve endocarditis presenting with chest pain dyspnea, general malaise and palpitations.  Jon Castro was recently hospitalized for disseminated MRSA infection and complicated by  bacteremia and tricuspid valve endocarditis s/p debridement of the right elbow from 8/3-8/25. Hospital course was complicated by secondary bacteremia with Pseudomonas and Acinetobacter likely from infected PIV site. He was treated with vancomycin and was changed to levofloxacin and linezolid at discharge with goal of 2 weeks of therapy and follow up in the ID office.    Jon Castro was afebrile, tachycardic and hypotensive in the ED. WBC count was 12.2, hemoglobin 9.1 and platelets 130. EKG showed sinus tachycardia. Chest x-ray with no evidence of cardiopulmonary disease. Blood cultures drawn and in process. He has been taking his linezolid and levofloxacin as prescribed. Denies any new recreational or illicit drug use. Complaining of feelings of generalized malaise and headache. No increasing pain tenderness or drainage from previous surgical site.    Review of Systems: Review of Systems  Constitutional: Positive for malaise/fatigue. Negative for chills, fever and weight loss.  Respiratory: Negative for cough, shortness of breath and wheezing.   Cardiovascular: Positive for chest pain and palpitations. Negative for leg swelling.  Gastrointestinal: Negative for abdominal pain, constipation, diarrhea, nausea and vomiting.  Skin: Negative for rash.  Neurological: Positive for headaches.     Past Medical History:  Diagnosis Date  . Back pain   . Cellulitis 10/13/2019  . Drug abuse (HCC)   . Fungemia 05/02/2019  . IV drug abuse (HCC)   . IVDU (intravenous drug user)   . LFTs abnormal   . MRSA bacteremia 04/29/2019  . Overdose of heroin, accidental or unintentional, initial encounter (HCC) 05/31/2019  . Suicidal ideation 10/30/2019    Social History   Tobacco Use  .  Smoking status: Current Every Day Smoker    Packs/day: 0.50    Types: Cigarettes  . Smokeless tobacco: Never Used  Vaping Use  . Vaping Use: Never used  Substance Use Topics  . Alcohol use: Yes  . Drug use: Yes    Types:  IV, Cocaine, Amphetamines, Marijuana, Heroin, Methamphetamines    Comment: HEROIN    History reviewed. No pertinent family history.  Allergies  Allergen Reactions  . Codeine Nausea And Vomiting  . Hydrocodone Itching  . Tramadol Other (See Comments)    "messes with head"    OBJECTIVE: Blood pressure 108/78, pulse 99, temperature 99.2 F (37.3 C), resp. rate 18, SpO2 100 %.  Physical Exam Constitutional:      General: He is not in acute distress.    Appearance: He is well-developed. He is ill-appearing.  Cardiovascular:     Rate and Rhythm: Regular rhythm. Tachycardia present.     Heart sounds: Normal heart sounds.  Pulmonary:     Effort: Pulmonary effort is normal.     Breath sounds: Normal breath sounds.  Skin:    General: Skin is warm and dry.     Comments: Previous surgical sites appears clean and dry. Well approximated with no evidence of drainage or clear signs of infection.   Neurological:     Mental Status: He is alert.  Psychiatric:        Mood and Affect: Mood normal.     Lab Results Lab Results  Component Value Date   WBC 12.2 (H) 04/30/2020   HGB 9.1 (L) 04/30/2020   HCT 28.9 (L) 04/30/2020   MCV 82.8 04/30/2020   PLT 136 (L) 04/30/2020    Lab Results  Component Value Date   CREATININE 1.29 (H) 04/30/2020   BUN 9 04/30/2020   NA 136 04/30/2020   K 3.0 (L) 04/30/2020   CL 104 04/30/2020   CO2 23 04/30/2020    Lab Results  Component Value Date   ALT 56 (H) 04/30/2020   AST 89 (H) 04/30/2020   ALKPHOS 67 04/30/2020   BILITOT 0.9 04/30/2020     Microbiology: No results found for this or any previous visit (from the past 240 hour(s)).   [name], NP [facility]  04/30/2020  1:03 PM

## 2020-04-30 NOTE — ED Notes (Signed)
ECHO at bedside.

## 2020-04-30 NOTE — Progress Notes (Addendum)
NEW ADMISSION NOTE  Arrival Method: bed Mental Orientation: oriented x4, Telemetry: yes Assessment: Full assessment was not completed as the patient refused a full skin and body assessment. Skin:  Iv: left forearm Pain: 7 on a scale of 0-10 Tubes: 1 Safety Measures: Safety Fall Prevention Plan has been given, discussed and signed Admission: Completed 5 [facility] Orientation: Patient has been orientated to the room, unit and staff.  Family: 0  Orders have been reviewed and implemented. Will continue to monitor the patient. Call light has been placed within reach and bed alarm has been activated.  Patient is non cooperative and refused to answer admitting documentation questions.  Pat Patrick, RN

## 2020-04-30 NOTE — Progress Notes (Signed)
Patient is non compliant with wearing telemetry leads and refuses to let RN place leads back on, patient has been educated. RN will continue to monitor the patient.

## 2020-04-30 NOTE — Progress Notes (Signed)
Lab informed RN that they were unsuccessful in locating a viable vein to place second IV, MD has been notified. RN will continue to monitor patient.

## 2020-04-30 NOTE — Sepsis Progress Note (Signed)
Notified bedside nurse of need to draw blood cultures.  

## 2020-04-30 NOTE — Progress Notes (Signed)
Secure chat with [name], NP with infectious disease regarding PICC order received.  Informed that from ID standpoing a PICC is not preferred at this time and a central line or IR placed line may be needed if access becomes an issue.  Spoke with Vernell Morgans, RN regarding PICC order.  Informed her of the above conversation with ID.  Also discussed that patient has wound on right and thrombus on left which leaves no place for VAST to place bedside PICC.  Luie, RN states that she will notify admitting team.

## 2020-04-30 NOTE — Progress Notes (Signed)
Patient refused for skin assessment.

## 2020-04-30 NOTE — Progress Notes (Signed)
Pharmacy Antibiotic Note  Jon Castro is a 34 y.o. male admitted on 04/30/2020 with hx of MRSA + polymicrobial gram negative bacteremia on linezolid + Levaquin PTA.  Pharmacy has been consulted for vancomycin and meropenem dosing.   Plan: Vancomycin 1500mg  IV x 1, then 750 mg q 8 hours (previous therapeutic regimen 1250mg  IV every 8 hours, however presents with AKI today, f/u for improvement and ability to increase dose) Meropenem 1g IV every 8 hours Monitor renal function, ID recs and clinical progression Vancomycin trough at steady state      Temp (24hrs), Avg:99 F (37.2 C), Min:98.5 F (36.9 C), Max:99.2 F (37.3 C)  Recent Labs  Lab 04/29/20 0340 04/30/20 0308 04/30/20 0443  WBC 9.6 12.2*  --   CREATININE 1.13 1.29*  --   LATICACIDVEN  --  2.2* 2.0*    Estimated Creatinine Clearance: 87.2 mL/min (A) (by C-G formula based on SCr of 1.29 mg/dL (H)).    Allergies  Allergen Reactions  . Vicodin [Hydrocodone-Acetaminophen] Itching  . Codeine Nausea And Vomiting  . Codeine   . Tramadol Other (See Comments)    "messes with head"  . Tramadol   . Vicodin [Hydrocodone-Acetaminophen]     06/30/20, PharmD Clinical Pharmacist ED Pharmacist Phone # [phone] 04/30/2020 8:12 AM

## 2020-04-30 NOTE — Progress Notes (Signed)
Spoke to RN due to need to confirm if midline vs PICC line is needed for access. Pt has current order for vancomycin and has limited veins. RN to discuss with PA to confirm order.

## 2020-04-30 NOTE — ED Triage Notes (Signed)
Pt reports that he was recently discharged and had endocarditis, still has CP and headache.

## 2020-05-01 ENCOUNTER — Inpatient Hospital Stay (HOSPITAL_COMMUNITY): Payer: Self-pay

## 2020-05-01 ENCOUNTER — Other Ambulatory Visit: Payer: Self-pay

## 2020-05-01 DIAGNOSIS — Z915 Personal history of self-harm: Secondary | ICD-10-CM

## 2020-05-01 DIAGNOSIS — Z8679 Personal history of other diseases of the circulatory system: Secondary | ICD-10-CM

## 2020-05-01 DIAGNOSIS — U071 COVID-19: Secondary | ICD-10-CM

## 2020-05-01 DIAGNOSIS — E876 Hypokalemia: Secondary | ICD-10-CM

## 2020-05-01 DIAGNOSIS — D696 Thrombocytopenia, unspecified: Secondary | ICD-10-CM

## 2020-05-01 DIAGNOSIS — R079 Chest pain, unspecified: Secondary | ICD-10-CM

## 2020-05-01 DIAGNOSIS — A4102 Sepsis due to Methicillin resistant Staphylococcus aureus: Principal | ICD-10-CM

## 2020-05-01 DIAGNOSIS — Z87898 Personal history of other specified conditions: Secondary | ICD-10-CM

## 2020-05-01 DIAGNOSIS — N179 Acute kidney failure, unspecified: Secondary | ICD-10-CM

## 2020-05-01 DIAGNOSIS — F323 Major depressive disorder, single episode, severe with psychotic features: Secondary | ICD-10-CM

## 2020-05-01 DIAGNOSIS — I38 Endocarditis, valve unspecified: Secondary | ICD-10-CM

## 2020-05-01 DIAGNOSIS — Z720 Tobacco use: Secondary | ICD-10-CM

## 2020-05-01 LAB — COMPREHENSIVE METABOLIC PANEL
ALT: 62 U/L — ABNORMAL HIGH (ref 0–44)
AST: 109 U/L — ABNORMAL HIGH (ref 15–41)
Albumin: 2.4 g/dL — ABNORMAL LOW (ref 3.5–5.0)
Alkaline Phosphatase: 50 U/L (ref 38–126)
Anion gap: 9 (ref 5–15)
BUN: <5 mg/dL — ABNORMAL LOW (ref 6–20)
CO2: 20 mmol/L — ABNORMAL LOW (ref 22–32)
Calcium: 8.1 mg/dL — ABNORMAL LOW (ref 8.9–10.3)
Chloride: 110 mmol/L (ref 98–111)
Creatinine, Ser: 1.04 mg/dL (ref 0.61–1.24)
GFR calc Af Amer: >60 mL/min (ref >60)
GFR calc non Af Amer: >60 mL/min (ref >60)
Glucose, Bld: 111 mg/dL — ABNORMAL HIGH (ref 70–99)
Potassium: 4.1 mmol/L (ref 3.5–5.1)
Sodium: 139 mmol/L (ref 135–145)
Total Bilirubin: 0.7 mg/dL (ref 0.3–1.2)
Total Protein: 5.7 g/dL — ABNORMAL LOW (ref 6.5–8.1)

## 2020-05-01 LAB — RETICULOCYTES
Immature Retic Fract: 41.8 % — ABNORMAL HIGH (ref 2.3–15.9)
RBC.: 3.43 MIL/uL — ABNORMAL LOW (ref 4.22–5.81)
Retic Count, Absolute: 71 10*3/uL (ref 19.0–186.0)
Retic Ct Pct: 2.1 % (ref 0.4–3.1)

## 2020-05-01 LAB — PROTIME-INR
INR: 1.1 (ref 0.8–1.2)
Prothrombin Time: 13.3 seconds (ref 11.4–15.2)

## 2020-05-01 LAB — CBC
HCT: 26.5 % — ABNORMAL LOW (ref 39.0–52.0)
Hemoglobin: 8.6 g/dL — ABNORMAL LOW (ref 13.0–17.0)
MCH: 26.5 pg (ref 26.0–34.0)
MCHC: 32.5 g/dL (ref 30.0–36.0)
MCV: 81.8 fL (ref 80.0–100.0)
Platelets: 113 10*3/uL — ABNORMAL LOW (ref 150–400)
RBC: 3.24 MIL/uL — ABNORMAL LOW (ref 4.22–5.81)
RDW: 14.2 % (ref 11.5–15.5)
WBC: 4.8 10*3/uL (ref 4.0–10.5)
nRBC: 0 % (ref 0.0–0.2)

## 2020-05-01 LAB — MAGNESIUM: Magnesium: 1.7 mg/dL (ref 1.7–2.4)

## 2020-05-01 LAB — HIV ANTIBODY (ROUTINE TESTING W REFLEX): HIV Screen 4th Generation wRfx: NONREACTIVE

## 2020-05-01 LAB — PHOSPHORUS: Phosphorus: 3.3 mg/dL (ref 2.5–4.6)

## 2020-05-01 MED ORDER — THIAMINE HCL 100 MG/ML IJ SOLN
100.0000 mg | Freq: Every day | INTRAMUSCULAR | Status: DC
  Administered 2020-05-01 – 2020-05-04 (×4): 100 mg via INTRAVENOUS
  Filled 2020-05-01 (×5): qty 2

## 2020-05-01 MED ORDER — VANCOMYCIN HCL 1250 MG/250ML IV SOLN
1250.0000 mg | Freq: Three times a day (TID) | INTRAVENOUS | Status: DC
  Administered 2020-05-01 – 2020-05-05 (×11): 1250 mg via INTRAVENOUS
  Filled 2020-05-01 (×13): qty 250

## 2020-05-01 NOTE — Progress Notes (Signed)
    [facility]   Reason for visit: Follow up on endocarditis  Interval History: no acute events.  Blood cultures remain ngtd.  Afebrile, WBC wnl.    Physical Exam: Constitutional:  Vitals:   05/01/20 0603 05/01/20 0938  BP: 127/83 123/87  Pulse: 66 66  Resp: 16 18  Temp: 98.6 F (37 C) 98.2 F (36.8 C)  SpO2: 99% 100%   patient appears in NAD Respiratory: Normal respiratory effort; CTA B Cardiovascular: RRR GI: soft, nt, nd  Review of Systems: Constitutional: negative for fevers and chills Gastrointestinal: negative for nausea and diarrhea Integument/breast: negative for rash  Lab Results  Component Value Date   WBC 4.8 05/01/2020   HGB 8.6 (L) 05/01/2020   HCT 26.5 (L) 05/01/2020   MCV 81.8 05/01/2020   PLT 113 (L) 05/01/2020    Lab Results  Component Value Date   CREATININE 1.04 05/01/2020   BUN <5 (L) 05/01/2020   NA 139 05/01/2020   K 4.1 05/01/2020   CL 110 05/01/2020   CO2 20 (L) 05/01/2020    Lab Results  Component Value Date   ALT 62 (H) 05/01/2020   AST 109 (H) 05/01/2020   ALKPHOS 50 05/01/2020     Microbiology: Recent Results (from the past 240 hour(s))  Blood culture (routine x 2)     Status: None (Preliminary result)   Collection Time: 04/30/20  7:36 AM   Specimen: BLOOD LEFT ARM  Result Value Ref Range Status   Specimen Description BLOOD LEFT ARM  Final   Special Requests   Final    BOTTLES DRAWN AEROBIC AND ANAEROBIC Blood Culture adequate volume   Culture   Final    NO GROWTH < 24 HOURS Performed at [facility], [address] [place], [place] [address]    Report Status PENDING  Incomplete  Blood culture (routine x 2)     Status: None (Preliminary result)   Collection Time: 04/30/20  8:55 AM   Specimen: BLOOD RIGHT HAND  Result Value Ref Range Status   Specimen Description BLOOD RIGHT HAND  Final   Special Requests   Final    BOTTLES DRAWN AEROBIC AND ANAEROBIC Blood Culture adequate volume    Culture   Final    NO GROWTH < 24 HOURS Performed at [facility], [address] [place], [place] [address]    Report Status PENDING  Incomplete    Impression/Plan:  1. Endocarditis - multiple organsisms from before, no new growth.  No changes and continue with antibiotics for same duration through 9/7.   No significant valve dysfunction noted.   2. Medication monitoring - creat remains wnl.  Will continue to monitor.    3. transaminitis - has known hepatitis C, genotype 3, last checked in February and has not had treatment.  We can treat him as an outpatient.

## 2020-05-01 NOTE — Progress Notes (Signed)
Patient refused 6pm routine vital signs when RN tried to obtain them. Patient was educated, RN will continue to monitor the patient

## 2020-05-01 NOTE — Progress Notes (Signed)
Pharmacy Antibiotic Note  Jon Castro is a 34 y.o. male admitted on 04/30/2020 with hx of MRSA + polymicrobial gram negative bacteremia on linezolid + Levaquin PTA.  Pharmacy has been consulted for vancomycin and meropenem dosing. Patient admitted with AKI, but Scr back to baseline of ~1.04 today, therefore will increase dose back to previous effective dose of 1250mg  Iv q8h.   Plan: Vancomycin 1250mg  IV every 8 hours Meropenem 1g IV every 8 hours Monitor renal function, ID recs and clinical progression Vancomycin trough at steady state   Height: 6\' 1"  (185.4 cm) Weight: 73.5 kg (162 lb 0.6 oz) IBW/kg (Calculated) : 79.9  Temp (24hrs), Avg:98.5 F (36.9 C), Min:98.2 F (36.8 C), Max:98.6 F (37 C)  Recent Labs  Lab 04/29/20 0340 04/30/20 0308 04/30/20 0443 05/01/20 0846  WBC 9.6 12.2*  --  4.8  CREATININE 1.13 1.29*  --  1.04  LATICACIDVEN  --  2.2* 2.0*  --     Estimated Creatinine Clearance: 103.1 mL/min (by C-G formula based on SCr of 1.04 mg/dL).    Allergies  Allergen Reactions  . Codeine Nausea And Vomiting  . Hydrocodone Itching  . Tramadol Other (See Comments)    "messes with head"    Jon Castro A. 06/30/20, PharmD, BCPS, FNKF Clinical Pharmacist [place] Please utilize Amion for appropriate phone number to reach the unit pharmacist [facility])   05/01/2020 10:57 AM

## 2020-05-01 NOTE — Progress Notes (Addendum)
Subjective: Patient reports resting well overnight.  No other overnight events.  Patient does not report headache, chest pain, shortness of breath nor abdominal pain this a.m.  Objective:  Vital signs in last 24 hours: Vitals:   04/30/20 1449 04/30/20 1500 04/30/20 2105 05/01/20 0603  BP: (!) 155/83  122/74 127/83  Pulse: 65  65 66  Resp: 20  16 16   Temp: 98.4 F (36.9 C)  98.6 F (37 C) 98.6 F (37 C)  TempSrc:   Oral Oral  SpO2: 100%  99% 99%  Weight:  76.9 kg 73.5 kg   Height:  6\' 1"  (1.854 m)     General: Awake and alert, willingly participates in interview, minor irritation but improves over the course of interview Eyes: Anicteric sclerae, no conjunctival injection CV: RR, NR, no discernible murmur Pulm: Breathing comfortably, clear bilaterally Abd: Soft, NT, normoactive bowel sounds MSK:  He has a healing surgical wound on the right forearm which appears to be healing as expected and only minimally tender around the wound, no erythema noted Skin:  No new erythema or heat at the sites of previous wound and thrombophlebitis on the left arm, some minor scratches/possible injection sites on bilateral ankles Extremities: Distal pulses intact, RUE has well-healing wound in the medial area of antecubital fossa, slight pain to palpation directly around scar   Assessment/Plan:  Active Problems:   Sepsis (HCC)  This is a 34 year old male who uses IV drugs, history of MRSA bacteremia, candidemia, major depressive disorder with psychosis, and untreated hepatitis C, who was recently admitted for MRSA bacteremia secondary to right antecubital abscess status post I&D complicated by tricuspid valve endocarditis, polymicrobial gram-negative bacteremia, asymptomatic Covid, and acute superficial vein thrombosis presenting for a 3 to 4-day history of chest pain, shortness of breath, chills, and headaches.   Sepsis likely 2/2 bacteremia: Patient does not report moderate chest pain this  a.m. WBC this a.m. down from 12.2 to 4.8.  Patient did not appear to have PICC line placed.  Started on IV vancomycin and IV meropenem to treat bacteremia per ID consultation.  Patient also received 1 OxyIR 10 mg as needed and Tylenol for pain.  Received repeat echo yesterday significant for trivial mitral valve regurgitation and questionable linear density vegetation noted on right ventricular side of tricuspid valve.  Per patient vitals, no longer meeting SIRS criteria for sepsis.  Patient afebrile, normal heart rate, normal RR.  However patient does continue to have a source of infection for likely bacteremia.  Blood cultures have no growth less than 24 hours. -His right upper extremity wound appears to be well-healing, no erythema, redness, or drainage noted.  Seems unlikely that this will be the source of his infection. -Follow-up blood cultures  -ID consulted, appreciate recommendations -Continue IV vancomycin and meropenem  -Trend CBC -Discontinue maintenance fluids  Chest pain: Resolved Chest pain appears to be resolved at this time.  Patient does not want to wear continuous telemetry monitor.  Heart rate this a.m. 65. -Discontinue continuous telemetry  Recent asymptomatic + Covid: CXR at admission unremarkable, lung fields appear to be clear.  Patient has no complaints of shortness of breath or cough. -No treatment at this time  AKI-resolved Creatinine 1.29> 1.04, up from baseline of around 0.8; however patient appears to have responded well to fluid boluses and MIVF overnight.    BUN less than 5.  Albumin 2.4.   Patient will continue heart healthy diet.  Concern for possible beer [place] mania or general  protein malnutrition.  Patient transaminitis continues while new BUN with corrected creatinine is less than 5. -Discontinue fluids -Patient placed on heart diet -BMP in a.m. -Consider investigating EtOH use disorder  Hypokalemia-resolved Potassium 4.1 this a.m. we will continue to  monitor now that patient is on p.o. intake. Mg and Phos WNL. -BMP in a.m.  Thrombocytopenia Platelets down to 130 from 252 about 1 week ago, no evidence of bruising or bleeding on exam, denies any symptoms of bleeding.  Could be related to his linezolid. Continued decrease in platelets may also be a reaction to meropenem. Patient also appeared to have significant drop in WBC and had drops in multiple cell lines. We will continue to monitor for symptoms and repeat CBC in a.m. Patient has been refusing DVT PPx medication.  Concern for thrombocytopenia could be secondary to undiagnosed cirrhosis.  Patient is known to have hep C that remains untreated at this time.  Platelets a.m. and reported "pending" possibility that they were too low and may have needed a repeat analysis.  Hgb this a.m. 8.6. -CBC in a.m. -Follow-up PT/INR - follow-up Blood smear -Follow-up HIV ab - F/u reticulocytes  Transaminitis AST 89> 109 and ALT 56> 62, mildly increased from AST 44 and ALT 62 on last admission.  T bili 0.7.  Has a history of untreated hepatitis C which could be the cause.   Albumin 2.4.  Patient has new onset thrombocytopenia.  Hgb this a.m.8.6.  This is down from 9.1 yesterday however this may be due to dilution as patient received numerous fluid boluses as well as MIVF.  Patient baseline appears to be between 9 and 10.  Concern for possible undiagnosed cirrhosis. -May consider treating hep C inpatient  -Follow-up PT/INR -Follow-up CMP in a.m. -Follow- up RUQ [place] - NPO 4 hours before [place] -Pugh's class score  Substance abuse OUD Denies any recent heroin use since discharge.  Will need to continue talking about OUD clinic with patient and encouraged cessation.  Tobacco use Denies any smoking since discharge.    Patient reports he does not need nicotine patch.  Untreated hepatitis C Patient will need treatment in the future.  Major depressive disorder History of multiple suicidal  attempts -Continue home duloxetine -May benefit from  [facility]  FEN: replete lytes prn, HH diet  VTE ppx: Heparin, patient refusing he does not like injections during PT/INR will transition to p.o. anticoagulant Code Status: FULL    Prior to Admission Living Arrangement: Home Anticipated Discharge Location: Home Barriers to Discharge: IV substance use disorder, failed oral treatment for bacteremia Dispo: Anticipated discharge in approximately 4 weeks.  Patient has significant history of IV drug use and will require IV antibiotics for week minimum therapy for bacteremia as he has failed oral antibiotic therapy.  Bobbye Morton, MD 05/01/2020, 6:20 AM Pager: [phone] After 5pm on weekdays and 1pm on weekends: On Call pager [phone]

## 2020-05-02 LAB — COMPREHENSIVE METABOLIC PANEL
ALT: 57 U/L — ABNORMAL HIGH (ref 0–44)
AST: 74 U/L — ABNORMAL HIGH (ref 15–41)
Albumin: 2.7 g/dL — ABNORMAL LOW (ref 3.5–5.0)
Alkaline Phosphatase: 57 U/L (ref 38–126)
Anion gap: 7 (ref 5–15)
BUN: 5 mg/dL — ABNORMAL LOW (ref 6–20)
CO2: 23 mmol/L (ref 22–32)
Calcium: 8.3 mg/dL — ABNORMAL LOW (ref 8.9–10.3)
Chloride: 108 mmol/L (ref 98–111)
Creatinine, Ser: 0.93 mg/dL (ref 0.61–1.24)
GFR calc Af Amer: >60 mL/min (ref >60)
GFR calc non Af Amer: >60 mL/min (ref >60)
Glucose, Bld: 113 mg/dL — ABNORMAL HIGH (ref 70–99)
Potassium: 4.2 mmol/L (ref 3.5–5.1)
Sodium: 138 mmol/L (ref 135–145)
Total Bilirubin: 0.7 mg/dL (ref 0.3–1.2)
Total Protein: 5.9 g/dL — ABNORMAL LOW (ref 6.5–8.1)

## 2020-05-02 LAB — CBC
HCT: 27.8 % — ABNORMAL LOW (ref 39.0–52.0)
Hemoglobin: 9 g/dL — ABNORMAL LOW (ref 13.0–17.0)
MCH: 26.5 pg (ref 26.0–34.0)
MCHC: 32.4 g/dL (ref 30.0–36.0)
MCV: 82 fL (ref 80.0–100.0)
Platelets: 148 10*3/uL — ABNORMAL LOW (ref 150–400)
RBC: 3.39 MIL/uL — ABNORMAL LOW (ref 4.22–5.81)
RDW: 15 % (ref 11.5–15.5)
WBC: 5.7 10*3/uL (ref 4.0–10.5)
nRBC: 0 % (ref 0.0–0.2)

## 2020-05-02 LAB — TECHNOLOGIST SMEAR REVIEW

## 2020-05-02 MED ORDER — OXYCODONE HCL 5 MG PO TABS
5.0000 mg | ORAL_TABLET | ORAL | Status: DC | PRN
  Administered 2020-05-02 – 2020-05-03 (×5): 5 mg via ORAL
  Filled 2020-05-02 (×5): qty 1

## 2020-05-02 MED ORDER — RIVAROXABAN 10 MG PO TABS
10.0000 mg | ORAL_TABLET | Freq: Every day | ORAL | Status: DC
  Administered 2020-05-02 – 2020-05-05 (×4): 10 mg via ORAL
  Filled 2020-05-02 (×4): qty 1

## 2020-05-02 NOTE — Progress Notes (Signed)
Patient refused cardiac monitoring, at this time, education given but patient still declined. Will continue to monitor.   Donia Guiles, RN.

## 2020-05-02 NOTE — Plan of Care (Signed)
  Problem: Education: Goal: Knowledge of General Education information will improve Description: Including pain rating scale, medication(s)/side effects and non-pharmacologic comfort measures Outcome: Progressing   Problem: Pain Managment: Goal: General experience of comfort will improve Outcome: Progressing   

## 2020-05-02 NOTE — Progress Notes (Signed)
ANTICOAGULATION CONSULT NOTE - Initial Consult  Pharmacy Consult for rivaroxaban Indication: VTE prophylaxis  Allergies  Allergen Reactions  . Codeine Nausea And Vomiting  . Hydrocodone Itching  . Tramadol Other (See Comments)    "messes with head"    Patient Measurements: Height: 6\' 1"  (185.4 cm) Weight: 72.1 kg (158 lb 15.2 oz) IBW/kg (Calculated) : 79.9  Vital Signs: Temp: 98.4 F (36.9 C) (09/04 0417) Temp Source: Oral (09/04 0417) BP: 129/88 (09/04 0417) Pulse Rate: 78 (09/04 0417)  Labs: Recent Labs    04/30/20 0308 04/30/20 0443 05/01/20 0846 05/01/20 1851  HGB 9.1*  --  8.6*  --   HCT 28.9*  --  26.5*  --   PLT 136*  --  113*  --   LABPROT  --   --   --  13.3  INR  --   --   --  1.1  CREATININE 1.29*  --  1.04  --   TROPONINIHS 10 9  --   --     Estimated Creatinine Clearance: 101.1 mL/min (by C-G formula based on SCr of 1.04 mg/dL).   Medical History: Past Medical History:  Diagnosis Date  . Back pain   . Cellulitis 10/13/2019  . Drug abuse (HCC)   . Fungemia 05/02/2019  . IV drug abuse (HCC)   . IVDU (intravenous drug user)   . LFTs abnormal   . MRSA bacteremia 04/29/2019  . Overdose of heroin, accidental or unintentional, initial encounter (HCC) 05/31/2019  . Suicidal ideation 10/30/2019    Medications:  Scheduled:  . DULoxetine  30 mg Oral Daily  . rivaroxaban  10 mg Oral Daily  . thiamine injection  100 mg Intravenous Daily    Assessment: 34 YO male admitted for chest pain, shortness of breath, chills, and headache.  Recent admit for MRSA bacteremia secondary to R antecubital abscess s/p I&D complicated by tricuspid valve endocarditis, polymicrobial Gram negative bacteremia, asymptomatic COVID infection, and acute superficial venous thrombosis.    Pharmacy consulted to dose rivaroxaban for DVT ppx as patient has been refusing SQH.   Goal of Therapy:  Monitor platelets by anticoagulation protocol: Yes   Plan:  Initiate rivaroxaban 10mg   daily for DVT ppx Will monitor CBC   31, PharmD PGY-1 Acute Care Pharmacy Resident Office: [phone] 05/02/2020 10:03 AM

## 2020-05-02 NOTE — Progress Notes (Addendum)
Subjective: Jon Castro doing well this a.m.  No reports of chest pain, shortness of breath, abdominal pain.  Jon Castro does report generalized soreness.  Objective:  Vital signs in last 24 hours: Vitals:   05/01/20 2206 05/02/20 0417 05/02/20 0442 05/02/20 1026  BP: 124/80 129/88  128/89  Pulse: 77 78  73  Resp: 16   18  Temp: 98.2 F (36.8 C) 98.4 F (36.9 C)  98.9 F (37.2 C)  TempSrc: Oral Oral  Oral  SpO2: 100% 100%  100%  Weight: 72.1 kg  72.1 kg   Height:       General: Awake and alert, willingly participates in interview,  Eyes: Anicteric sclerae, no conjunctival injection CV: RR, NR, no discernible murmur Pulm: Breathing comfortably, clear bilaterally Abd: Soft, NT, normoactive bowel sounds MSK: He has a healing surgical wound on the right forearm which appears to be healing as expected and only minimally tender around the wound, no erythema noted Skin: No new erythema or heat at the sites of previous wound and thrombophlebitis on the left arm, some minor scratches/possible injection sites on bilateral ankles Extremities: Distal pulses intact, RUE has well-healing wound in the medial area of antecubital fossa, slight pain to palpation directly around scar  Assessment/Plan:  Active Problems:   Sepsis (HCC)   History of bacteremia   History of endocarditis This is a 34 year old male who uses IV drugs, history of MRSA bacteremia, candidemia, major depressive disorder with psychosis, and untreated hepatitis C, who was recently admitted forMRSA bacteremia secondary to right antecubital abscess status post I&D complicated by tricuspid valve endocarditis, polymicrobial gram-negative bacteremia, asymptomatic Covid, and acute superficial vein thrombosis presenting for a 3 to 4-day history of chest pain, shortness of breath, chills, and headaches.   Sepsis likely 2/2 bacteremia: Improving Jon Castro does not report moderate chest pain this a.m. WBC this a.m. 5.7  Blood cultures  have no growth less than 24 hours.  Pharmacy increased vancomycin dose due to AKI resolution.  RUE appears to be healing well. -Follow-up blood cultures  -ID consulted, appreciate recommendations -Continue IV vancomycin and meropenem  -Trend CBC -Discontinue maintenance fluids  Chest pain: Resolved Chest pain appears to be resolved at this time.  Jon Castro does not want to wear continuous telemetry monitor.  Heart rate this a.m. 65. -Discontinue continuous telemetry  Recent asymptomatic+Covid: CXR at admission unremarkable, lung fields appear to be clear.  Jon Castro has no complaints of shortness of breath or cough. -No treatment at this time  AKI-resolved Creatinine 1.29> 1.04, up from baseline of around 0.8;.  Jon Castro reports no EtOH use. -Jon Castro placed on heart diet -BMP in a.m.   Hypokalemia-resolved Potassium 4.2 this a.m. we will continue to monitor now that Jon Castro is on p.o. intake.  -BMP in a.m.  Thrombocytopenia Platelets increased to 148 from 113 yesterday. No evidence of bruising or bleeding on exam, denies any symptoms of bleeding. Could be related to his linezolid. Continued decrease in platelets may also be a reaction to meropenem.  PT/INR 13.3/1.1.  Blood smear read normal WBC with dimorphic RBC.  Hgb this a.m.Marland Kitchen9. Jon Castro baseline appears to be between 9 and 10.  reticulocyte count 2.1.  HIV antibody negative -CBC in a.m.   Transaminitis AST 89> 109> 74 and ALT 56> 62> 57. T bili 0.7.  Has a history of untreated hepatitis C which could be the cause.   Albumin 2.7.       Concern for possible undiagnosed cirrhosis.  RUQ ultrasound resulted WNL.  There  was some noted bladder wall thickening with trace pericholecystic fluid although nonspecific. -Follow-up CMP in a.m. -Start Xarelto 10 mg   Substance abuse OUD Denies any recent heroin use since discharge. Will need to continue talking about OUDclinic with Jon Castro and encouraged cessation.  Concern Jon Castro  may be going through mild withdrawal due to complaints of generalized soreness. -Oxy IR 5 every 4 H  Tobacco use Denies any smoking since discharge.   Jon Castro reports he does not need nicotine patch.  Untreated hepatitis C Jon Castro will need treatment in the future.  Infectious diseases will take care of outpatient.  Major depressive disorder History of multiple suicidal attempts -Continue home duloxetine -May benefit from [facility]   Prior to Admission Living Arrangement: Home Anticipated Discharge Location: Home Barriers to Discharge: History of IV substance use disorder Dispo: Anticipated discharge in approximately 4 weeks. Bobbye Morton, MD 05/02/2020, 11:29 AM Pager: [phone] After 5pm on weekdays and 1pm on weekends: On Call pager [phone]

## 2020-05-03 LAB — IRON AND TIBC
Iron: 19 ug/dL — ABNORMAL LOW (ref 45–182)
Saturation Ratios: 5 % — ABNORMAL LOW (ref 17.9–39.5)
TIBC: 379 ug/dL (ref 250–450)
UIBC: 360 ug/dL

## 2020-05-03 LAB — LACTATE DEHYDROGENASE: LDH: 148 U/L (ref 98–192)

## 2020-05-03 LAB — FERRITIN: Ferritin: 29 ng/mL (ref 24–336)

## 2020-05-03 LAB — VITAMIN B12: Vitamin B-12: 502 pg/mL (ref 180–914)

## 2020-05-03 MED ORDER — OXYCODONE HCL 5 MG PO TABS
5.0000 mg | ORAL_TABLET | Freq: Two times a day (BID) | ORAL | Status: DC
  Administered 2020-05-03 (×2): 5 mg via ORAL
  Filled 2020-05-03 (×4): qty 1

## 2020-05-03 MED ORDER — OXYCODONE HCL 5 MG PO TABS
10.0000 mg | ORAL_TABLET | Freq: Four times a day (QID) | ORAL | Status: DC | PRN
  Administered 2020-05-03 – 2020-05-05 (×7): 10 mg via ORAL
  Filled 2020-05-03 (×8): qty 2

## 2020-05-03 NOTE — Progress Notes (Signed)
Patient got upset this morning when this nurse and his nurse tech went to his bathroom this morning. Patient's response to this was a bit questionable as to why he didn't want anyone to go to the bedside. However patient's vital signs were stable. Will continue to monitor.   Donia Guiles, RN.

## 2020-05-03 NOTE — Plan of Care (Signed)
  Problem: Education: Goal: Knowledge of General Education information will improve Description: Including pain rating scale, medication(s)/side effects and non-pharmacologic comfort measures Outcome: Progressing   Problem: Pain Managment: Goal: General experience of comfort will improve Outcome: Progressing   

## 2020-05-03 NOTE — Progress Notes (Signed)
HD#3 Subjective:  Overnight Events: none   Patient resting comfortably in bed. Continues to have generalized pain. Otherwise, denies new complaints.   Objective:  Vital signs in last 24 hours: Vitals:   05/02/20 2131 05/03/20 0500 05/03/20 0511 05/03/20 0918  BP: 114/65  116/77 131/88  Pulse: 72  74 80  Resp: 19  18 18   Temp: 98.3 F (36.8 C)  99.5 F (37.5 C) 98.6 F (37 C)  TempSrc: Oral  Oral   SpO2: 99%  99% 100%  Weight:  72.1 kg    Height:       Supplemental O2: Room Air SpO2: 100 %   Physical Exam:  Physical Exam Constitutional:      Appearance: Normal appearance.  HENT:     Head: Normocephalic and atraumatic.  Eyes:     Extraocular Movements: Extraocular movements intact.  Cardiovascular:     Rate and Rhythm: Normal rate.     Pulses: Normal pulses.     Heart sounds: Normal heart sounds.  Pulmonary:     Effort: Pulmonary effort is normal.     Breath sounds: Normal breath sounds.  Abdominal:     General: Bowel sounds are normal.     Palpations: Abdomen is soft.     Tenderness: There is no abdominal tenderness.  Musculoskeletal:        General: Normal range of motion.     Cervical back: Normal range of motion.     Right lower leg: No edema.     Left lower leg: No edema.  Skin:    General: Skin is warm and dry.  Neurological:     Mental Status: He is alert and oriented to person, place, and time. Mental status is at baseline.  Psychiatric:        Mood and Affect: Mood normal.     Filed Weights   05/01/20 2206 05/02/20 0442 05/03/20 0500  Weight: 72.1 kg 72.1 kg 72.1 kg     Intake/Output Summary (Last 24 hours) at 05/03/2020 1126 Last data filed at 05/03/2020 0927 Gross per 24 hour  Intake 2460 ml  Output 0 ml  Net 2460 ml   Net IO Since Admission: 5,656.32 mL [05/03/20 1126]  Pertinent Labs: CBC Latest Ref Rng & Units 05/02/2020 05/01/2020 04/30/2020  WBC 4.0 - 10.5 K/uL 5.7 4.8 12.2(H)  Hemoglobin 13.0 - 17.0 g/dL 9.0(L) 8.6(L) 9.1(L)    Hematocrit 39 - 52 % 27.8(L) 26.5(L) 28.9(L)  Platelets 150 - 400 K/uL 148(L) 113(L) 136(L)    CMP Latest Ref Rng & Units 05/02/2020 05/01/2020 04/30/2020  Glucose 70 - 99 mg/dL 06/30/2020) 258(N) 92  BUN 6 - 20 mg/dL 5(L) 277(O) 9  Creatinine 0.61 - 1.24 mg/dL <2(U 2.35 3.61)  Sodium 135 - 145 mmol/L 138 139 136  Potassium 3.5 - 5.1 mmol/L 4.2 4.1 3.0(L)  Chloride 98 - 111 mmol/L 108 110 104  CO2 22 - 32 mmol/L 23 20(L) 23  Calcium 8.9 - 10.3 mg/dL 8.3(L) 8.1(L) 8.7(L)  Total Protein 6.5 - 8.1 g/dL 5.9(L) 5.7(L) 7.3  Total Bilirubin 0.3 - 1.2 mg/dL 0.7 0.7 0.9  Alkaline Phos 38 - 126 U/L 57 50 67  AST 15 - 41 U/L 74(H) 109(H) 89(H)  ALT 0 - 44 U/L 57(H) 62(H) 56(H)    Pending Labs: none  Imaging: No results found.  Assessment/Plan:   Active Problems:   Sepsis (HCC)   History of bacteremia   History of endocarditis   Patient Summary: 4.43(X  Jon Castro is a 34 y.o. with a pertinent PMH of MRSA bacteremia, candidemia, major depressive disorder with psychosis, and untreated hepatitis C who presented with failure of po antibiotics for MRSA bacteremia and admitted for IV antibiotic therapy.    He is on hospital day 3 and doing well.  MRSA Bacteremia: Patient is on day 4 of antibiotic therapy with vancomycin and meropenem. BC no growth at 3 days. Continues to have gernelized pain, which could be myalgias due to the infection.   - Continue 4 weeks of antibiotic therapy.  -ID consulted, appreciate recommendations - Oxy IR 10 mg q 6 hours with Oxy IR 5 mg q 12 hrs for breakthrough.   Thrombocytopenia Plts 148 yesterday. Etiology uncertain. Could be due to recent COVD infection or chronic HCV. Further work up pending.   Substance abuse OUD On COWS protocol and will titrate down Oxy and start Suboxone when possible.   Diet: Normal IVF: None,None VTE: NOAC Code: Full PT/OT recs: None, none. TOC recs: none   Dispo: Anticipated discharge to Home pending completion of 4 weeks  of antibiotic therapy.    Dellia Cloud, D.O. MCIMTP, PGY-2 Date 05/03/2020 Time 11:26 AM Pager: [phone] Please contact the on call pager after 5 pm and on weekends at [phone].

## 2020-05-04 DIAGNOSIS — B1920 Unspecified viral hepatitis C without hepatic coma: Secondary | ICD-10-CM

## 2020-05-04 DIAGNOSIS — M7918 Myalgia, other site: Secondary | ICD-10-CM

## 2020-05-04 NOTE — Progress Notes (Signed)
    [facility]   Reason for visit: Follow up on TV endocarditis  Interval History: his blood cultures remain ngtd.  He remains afebrile with WBC wnl last checked.  No new events.    Physical Exam: Constitutional:  Vitals:   05/04/20 0525 05/04/20 1004  BP: 114/75 125/75  Pulse: 68 80  Resp: 19 18  Temp: 98.4 F (36.9 C) 99.3 F (37.4 C)  SpO2: 100% 100%   patient appears in NAD Respiratory: Normal respiratory effort; CTA B Cardiovascular: RRR   Review of Systems: Constitutional: negative for fevers and chills Gastrointestinal: negative for nausea and diarrhea  Lab Results  Component Value Date   WBC 5.7 05/02/2020   HGB 9.0 (L) 05/02/2020   HCT 27.8 (L) 05/02/2020   MCV 82.0 05/02/2020   PLT 148 (L) 05/02/2020    Lab Results  Component Value Date   CREATININE 0.93 05/02/2020   BUN 5 (L) 05/02/2020   NA 138 05/02/2020   K 4.2 05/02/2020   CL 108 05/02/2020   CO2 23 05/02/2020    Lab Results  Component Value Date   ALT 57 (H) 05/02/2020   AST 74 (H) 05/02/2020   ALKPHOS 57 05/02/2020     Microbiology: Recent Results (from the past 240 hour(s))  Blood culture (routine x 2)     Status: None (Preliminary result)   Collection Time: 04/30/20  7:36 AM   Specimen: BLOOD LEFT ARM  Result Value Ref Range Status   Specimen Description BLOOD LEFT ARM  Final   Special Requests   Final    BOTTLES DRAWN AEROBIC AND ANAEROBIC Blood Culture adequate volume   Culture   Final    NO GROWTH 4 DAYS Performed at [facility], [address], [place], [place] [address]    Report Status PENDING  Incomplete  Blood culture (routine x 2)     Status: None (Preliminary result)   Collection Time: 04/30/20  8:55 AM   Specimen: BLOOD RIGHT HAND  Result Value Ref Range Status   Specimen Description BLOOD RIGHT HAND  Final   Special Requests   Final    BOTTLES DRAWN AEROBIC AND ANAEROBIC Blood Culture adequate volume   Culture   Final    NO  GROWTH 4 DAYS Performed at [facility], [address] [place], [place] [address]    Report Status PENDING  Incomplete    Impression/Plan:  1. TV endocarditis - MRSA and previous plan for antibiotics through September 7th for a 4 weeks course.  Repeat TTE during this hospitalization reassuring.   No indication to continue antibiotics after 9/7.  Stop date placed by pharmacy.   2.  Polymicrobial GNR bacteremia - on meropenem and plan for same stop date as above and stop date placed by pharmacy.    3. Substance abuse - going to start suboxone 9/9.    4.  Thrombocytopenia - previously normal   May be due to linezolid and he has remained off of this.    I will sign off, call with questions

## 2020-05-04 NOTE — Progress Notes (Addendum)
Pharmacy Antibiotic Note  Jon Castro is a 34 y.o. male admitted on 04/30/2020 with hx of MRSA + polymicrobial gram negative bacteremia on linezolid + Levaquin PTA.  Pharmacy has been consulted for vancomycin and meropenem dosing. Patient admitted with AKI, but Scr 0.93, back to baseline. Stop dates have been entered for vancomycin and meropenem for tomorrow 9/7 for 6 days of current therapy per ID.    Plan: Continue current abx: Vancomycin 1250mg  IV every 8 hours Meropenem 1g IV every 8 hours Monitor renal function and clinical progression   Height: 6\' 1"  (185.4 cm) Weight: 72.1 kg (158 lb 15.2 oz) IBW/kg (Calculated) : 79.9  Temp (24hrs), Avg:98.7 F (37.1 C), Min:98.4 F (36.9 C), Max:98.9 F (37.2 C)  Recent Labs  Lab 04/29/20 0340 04/30/20 0308 04/30/20 0443 05/01/20 0846 05/02/20 1120  WBC 9.6 12.2*  --  4.8 5.7  CREATININE 1.13 1.29*  --  1.04 0.93  LATICACIDVEN  --  2.2* 2.0*  --   --     Estimated Creatinine Clearance: 113.1 mL/min (by C-G formula based on SCr of 0.93 mg/dL).    Allergies  Allergen Reactions  . Codeine Nausea And Vomiting  . Hydrocodone Itching  . Tramadol Other (See Comments)    "messes with head"    07/01/20, PharmD PGY1 Pharmacy Resident 05/04/2020 7:30 AM

## 2020-05-04 NOTE — Hospital Course (Addendum)
Jon Castro is a 34 y.o. with a pertinent PMH of MRSA bacteremia, candidemia, major depressive disorder with psychosis, and untreated hepatitis C who presented with failure of po antibiotics for MRSA bacteremia and admitted for IV antibiotic therapy.    MRSA Bacteremia: Patient presented with chest pain after recent discharge for MRSA bacteremia and reported compliance with linezolid and  levaquin. Discontinued PO medications and consulted ID. Started IV vanc and meropenem which was dosed by pharmacy. Blood cultures did not have new growth. ID felt that original end date of 9/7 was appropriate course time on IV abx. IV also suggested that they could treat patient Hep C outpatient.   Thrombocytopenia Patient presented with acute onset thrombocytopenia intially thought to be 2/2 linezolid outpatient vs new meropenem inpatient vs. Chronic Hep C status. Work up inpatient due to significant drop included non significant PT/INR, blood smear with normal WBC and dimorphic RBCs, retic count of 2.1 and HIV neg ab test. Plt suddenly increased on their own.  Substance abuse OUD  Placed patient on COWS. Patient received scheduled low dose OxyIR inpatient with the plan of transitioning to Suboxone.Patient found on day of discharge to have needles and empty pill bottles in his room and bathroom. Patient reported that they were from nurses and the pill bottles were from his PO abx that he was taking when he presented to the hospital. Patient was upset but decided not to leave AMA and waited for team to appropriately discharge him and received follow-up appt with ID for Hep C treatment. Patient was not extremely interested in suboxone but was made aware that he still had access to the Suboxone clinic provided to him at his last discharge.  Hypokalemia Patient presented hypokalemic, K was replenished and remained stable.

## 2020-05-04 NOTE — Progress Notes (Addendum)
° °  Subjective: Had no overnight events.  Patient continues to report diffuse myalgias that make it hard for him to sleep.  This is preventing him from wanting to start buprenorphine and naloxone.  Patient otherwise has no complaints this a.m.  Objective:  Vital signs in last 24 hours: Vitals:   05/03/20 1648 05/03/20 2050 05/04/20 0525 05/04/20 1004  BP: 129/79 121/73 114/75 125/75  Pulse: 70 76 68 80  Resp: 18 18 19 18   Temp: 98.8 F (37.1 C) 98.9 F (37.2 C) 98.4 F (36.9 C) 99.3 F (37.4 C)  TempSrc:   Oral Oral  SpO2: 99% 100% 100% 100%  Weight:      Height:       Constitutional:      Appearance: Normal appearance.  HENT:     Head: Normocephalic and atraumatic.  Eyes:     Extraocular Movements: Extraocular movements intact.  Cardiovascular:     Rate and Rhythm: Normal rate.     Pulses: Normal pulses.     Heart sounds: Normal heart sounds.  Pulmonary:     Effort: Pulmonary effort is normal.     Breath sounds: Normal breath sounds.  Abdominal:     General: Bowel sounds are normal.     Palpations: Abdomen is soft.     Tenderness: There is no abdominal tenderness.  Musculoskeletal:        General: Normal range of motion.     Cervical back: Normal range of motion.     Right lower leg: No edema.     Left lower leg: No edema.  Skin:    General: Skin is warm and dry.  Neurological:     Mental Status: He is alert and oriented to person, place, and time. Mental status is at baseline.  Psychiatric:        Mood and Affect: Mood normal.   Assessment/Plan:  Active Problems:   Sepsis (HCC)   History of bacteremia   History of endocarditis  Chrsitopher Wik is a 34 y.o. with a pertinent PMH of MRSA bacteremia, candidemia, major depressive disorder with psychosis, and untreated hepatitis C who presented with failure of po antibiotics for MRSA bacteremia and admitted for IV antibiotic therapy.    He is on hospital day 4 and doing well.  MRSA Bacteremia: Patient  is on day 4 of antibiotic therapy with vancomycin and meropenem. BC no growth at 4 days. Continues to have generalized pain, which could be myalgias due to the infection.   -    ID therapy recommend stop date will be 9/7 of vancomycin and meropenem IV -ID consulted, appreciate recommendations - Oxy IR 10 mg q 6 hours with Oxy IR 5 mg q 12 hrs for breakthrough.   Thrombocytopenia Plts 148 9/4 return from prior CBCs during this hospitalization..  Iron 19. Saturation ratio 5.  Etiology uncertain. Could be due to recent COVD infection or chronic HCV.   Substance abuse OUD On COWS protocol and will titrate down Oxy and start Suboxone 9/9, should patient remain inpatient.  Patient agrees to goal beginning titration on this day.  Plan for OxyIR in the a.m. and Suboxone beginning that p.m.  Prior to Admission Living Arrangement: Home Anticipated Discharge Location: Home Barriers to Discharge: SCD with IV drug use Hx and needing IV ABX Dispo: Anticipated discharge in approximately 1 day  11/9, MD 05/04/2020, 1:24 PM Pager: [phone] After 5pm on weekdays and 1pm on weekends: On Call pager [phone]

## 2020-05-05 ENCOUNTER — Inpatient Hospital Stay: Payer: Self-pay | Admitting: Family

## 2020-05-05 LAB — HAPTOGLOBIN: Haptoglobin: 88 mg/dL (ref 17–317)

## 2020-05-05 LAB — CBC
HCT: 32.1 % — ABNORMAL LOW (ref 39.0–52.0)
Hemoglobin: 10.1 g/dL — ABNORMAL LOW (ref 13.0–17.0)
MCH: 25.6 pg — ABNORMAL LOW (ref 26.0–34.0)
MCHC: 31.5 g/dL (ref 30.0–36.0)
MCV: 81.3 fL (ref 80.0–100.0)
Platelets: 158 10*3/uL (ref 150–400)
RBC: 3.95 MIL/uL — ABNORMAL LOW (ref 4.22–5.81)
RDW: 14.8 % (ref 11.5–15.5)
WBC: 13.2 10*3/uL — ABNORMAL HIGH (ref 4.0–10.5)
nRBC: 0 % (ref 0.0–0.2)

## 2020-05-05 LAB — CULTURE, BLOOD (ROUTINE X 2)
Culture: NO GROWTH
Culture: NO GROWTH
Special Requests: ADEQUATE
Special Requests: ADEQUATE

## 2020-05-05 NOTE — Progress Notes (Signed)
DISCHARGE NOTE HOME Jon Castro to be discharged Home per MD order. Discussed prescriptions and follow up appointments with the patient. Prescriptions given to patient; medication list explained in detail. Patient verbalized understanding.  Skin clean, dry and intact without evidence of skin break down, no evidence of skin tears noted. IV catheter discontinued intact. Site without signs and symptoms of complications. Dressing and pressure applied. Pt denies pain at the site currently. No complaints noted.  Patient free of lines, drains, and wounds.   An After Visit Summary (AVS) was printed and given to the patient. Patient escorted via wheelchair, and discharged home via private auto.  Dondra Spry, RN

## 2020-05-05 NOTE — Progress Notes (Signed)
During med administration, patient got up to use the restroom. While waiting on the patient, this RN tried to straighten up the patient's bed/room. While trying to make bed, a blue and white capsule and pink tablet were found in the bed. Also found two pennies, went to put the change in the bedside table and found 2 cigarettes. Also found two pill bottles of white tablets under the bed. When the patient came out of the bathroom, the bathroom window was open, an empty flush was under the radiator, and the patient had his credit card on the window seal. Security was called, patient emptied his pockets and he had insulin syringe, and two lighters.

## 2020-05-05 NOTE — Progress Notes (Signed)
Charge nurse reviewed AVS with patient

## 2020-05-05 NOTE — Discharge Instructions (Signed)
Dear Jon Castro,   Thank you so much for allowing [place] to be part of your care!  You were admitted to [facility] for bacteremia and continued antibiotic therapy. You completed treatment 05/05/2020.   POST-HOSPITAL & CARE INSTRUCTIONS 1. Please follow up with Infectious Disease for outpatient Hep C treatment 9/14 at 10am. 2. Please call the Suboxone clinic at [facility] again. 3. Please let PCP/Specialists know of any changes that were made.  4. Please see medications section of this packet for any medication changes.   DOCTOR'S APPOINTMENT & FOLLOW UP CARE INSTRUCTIONS  Future Appointments  Date Time Provider Department Center  05/11/2020  3:00 PM Alphonzo Severance, MD [facility]  05/12/2020 10:00 AM Veryl Speak, FNP [facility]  05/27/2020  9:30 AM Hoy Register, MD [facility] None    RETURN PRECAUTIONS:   Take care and be well!  Family Medicine Teaching Service Inpatient Team [place]  [facility]  [address] [place], [place] [address] [phone]

## 2020-05-05 NOTE — Progress Notes (Signed)
Charge nurse and AD have talked with the patient, medications, cigarettes and lighters were placed in a patient belonging bag and left at nurses station. MD was paged, and patient expressed his wishes to leave the hospital d/t new restrictions. MD plan to consult with infectious disease and then come back to discuss plan of care. Patient is agreeable to wait until 13:15 before leaving. They are aware he does not have an IV and are ok with waiting to replace IV until plan of care is determined.

## 2020-05-05 NOTE — Progress Notes (Signed)
   Subjective: Received page from RN that they had found used needles and some pill bottles in the patient's room after doing search.  Patient was threatening to leave AMA after being accused of having drug paraphernalia and using illicit substances while in the hospital. Jon Castro to see patient and was able to agree upon a few more hours to curbside ID. Patient reported that accusations were false.  Also spoke with patient about possibly starting Suboxone outpatient.  Responded that he had called after his last discharge but was not sure he would get in this September.  Objective:  Vital signs in last 24 hours: Vitals:   05/04/20 1636 05/04/20 2052 05/05/20 0534 05/05/20 0935  BP: 113/71 103/69 115/74 118/72  Pulse: (!) 110 88 85 88  Resp: 18 16 16 18   Temp: 100.2 F (37.9 C) 99.3 F (37.4 C) 99.4 F (37.4 C) 98.9 F (37.2 C)  TempSrc: Oral Oral Oral Oral  SpO2: 96% 99% 100% 100%  Weight:  70.2 kg    Height:       Physical Exam Constitutional:      General: He is not in acute distress.    Appearance: He is well-developed.  HENT:     Head: Normocephalic and atraumatic.  Eyes:     Extraocular Movements: Extraocular movements intact.  Skin:    General: Skin is cool.       Neurological:     Mental Status: He is alert.  Psychiatric:        Behavior: Behavior is agitated.     Comments: Restless and fidgety     Assessment/Plan:  Active Problems:   Sepsis (HCC)   History of bacteremia   History of endocarditis   Jon Castro a 34 y.o.with a pertinent PMH of MRSA bacteremia, candidemia, major depressive disorder with psychosis, and untreated hepatitis Cwho presented with failure of po antibiotics for MRSA bacteremiaand admitted for IV antibiotic therapy.  Heis on hospital day 4and doing well.  MRSA Bacteremia: Patientis on day 5 of antibiotic therapy with vancomycin and meropenem. BC no growth at 5 days.Patient doing better today. -   ID therapy  recommend stop date will be 9/7 of vancomycin and meropenem IV   Thrombocytopenia- Resolved Plts 158.    Substance abuse OUD   Patient was also noted to have new thrombophlebitis at IV site.  Patient was found to have needles and pill bottles underneath his bed in the bathroom.  Patient seemed not fully motivated yet to stop illicit substance use but was appreciative of care. -Follow-up with ID outpatient for hep C treatment   Prior to Admission Living Arrangement: Home Anticipated Discharge Location: Home Barriers to Discharge: None Dispo: Anticipated discharge in approximately 0 day(s). 11/7, MD 05/05/2020, 6:36 PM Pager: [phone] After 5pm on weekdays and 1pm on weekends: On Call pager [phone]

## 2020-05-06 LAB — PATHOLOGIST SMEAR REVIEW

## 2020-05-07 NOTE — Discharge Summary (Signed)
Name: Jon Castro MRN: [id number] DOB: 1985/12/02 34 y.o. PCP: Patient, [name] Per  Date of Admission: 04/30/2020  2:30 AM Date of Discharge: 05/05/2020 Attending Physician: No att. providers found  Discharge Diagnosis: 1. Sepsis 2. Hx of bacteremia 3. History of endocarditis  Discharge Medications: Allergies as of 05/05/2020      Reactions   Codeine Nausea And Vomiting   Hydrocodone Itching   Tramadol Other (See Comments)   "messes with head"      Medication List    STOP taking these medications   levofloxacin 750 MG tablet Commonly known as: LEVAQUIN   linezolid 600 MG tablet Commonly known as: ZYVOX   nicotine 14 mg/24hr patch Commonly known as: NICODERM CQ - dosed in mg/24 hours   Oxycodone HCl 20 MG Tabs     TAKE these medications   DULoxetine 30 MG capsule Commonly known as: CYMBALTA Take 1 capsule (30 mg total) by mouth daily.       Disposition and follow-up:   Mr.Jon Castro was discharged from [facility] in Stable condition.  At the hospital follow up visit please address:  1.  Consider reassessing patient readiness for Suboxone.  2. Patient is ready for outpatient Hep C treatment with ID.   2.  Labs / imaging needed at time of follow-up: None  3.  Pending labs/ test needing follow-up: Pathology review smear is below  Follow-up Appointments:  Follow-up Information    [facility]              Follow up on 05/12/2020.   Why: 10:00 am follow up with Jon Eke, NP  Contact information: [address] [place] [address]              Hospital Course by problem list: Jon Castro is a 34 y.o. with a pertinent PMH of MRSA bacteremia, candidemia, major depressive disorder with psychosis, and untreated hepatitis C who presented with failure of po antibiotics for MRSA bacteremia and admitted for IV antibiotic therapy.    MRSA Bacteremia: Patient  presented with chest pain after recent discharge for MRSA bacteremia and reported compliance with linezolid and  levaquin. Discontinued PO medications and consulted ID. Started IV vanc and meropenem which was dosed by pharmacy. Blood cultures did not have new growth. ID felt that original end date of 9/7 was appropriate course time on IV abx. IV also suggested that they could treat patient Hep C outpatient.   Thrombocytopenia Patient presented with acute onset thrombocytopenia intially thought to be 2/2 linezolid outpatient vs new meropenem inpatient vs. Chronic Hep C status. Work up inpatient due to significant drop included non significant PT/INR, blood smear with normal WBC and dimorphic RBCs, retic count of 2.1 and HIV neg ab test. Plt suddenly increased on their own.  Substance abuse OUD  Placed patient on COWS. Patient received scheduled low dose OxyIR inpatient with the plan of transitioning to Suboxone.Patient found on day of discharge to have needles and empty pill bottles in his room and bathroom. Patient reported that they were from nurses and the pill bottles were from his PO abx that he was taking when he presented to the hospital. Patient was upset but decided not to leave AMA and waited for team to appropriately discharge him and received follow-up appt with ID for Hep C treatment. Patient was not extremely interested in suboxone but was made aware that he still had access to  the Suboxone clinic provided to him at his last discharge.  Hypokalemia Patient presented hypokalemic, K was replenished and remained stable.    Discharge Vitals:   BP 118/72 (BP Location: Right Arm)   Pulse 88   Temp 98.9 F (37.2 C) (Oral)   Resp 18   Ht 6\' 1"  (1.854 m)   Wt 70.2 kg   SpO2 100%   BMI 20.42 kg/m   Pertinent Labs, Studies, and Procedures:  Pathologist review of Blood smear: Thrombocytopenia with normocytic anemia PT/INR :13.3/1.1 HIV ab: Negative Haptoglobin: 88 LDH: 148 Iron:19,  TIBC 379, Sat ratios 5 Ferritin: 29 RUQ abdomen: unremarkable liver Bcx x2: no growth Discharge Instructions: Discharge Instructions    Increase activity slowly   Complete by: As directed    No wound care   Complete by: As directed       Signed: [place], MD 05/07/2020, 6:43 AM   Pager: [phone]

## 2020-05-11 ENCOUNTER — Encounter: Payer: Self-pay | Admitting: Student

## 2020-05-12 ENCOUNTER — Inpatient Hospital Stay: Payer: Self-pay | Admitting: Family

## 2020-05-27 ENCOUNTER — Inpatient Hospital Stay: Payer: Self-pay | Admitting: Family Medicine

## 2021-06-05 IMAGING — DX LEFT ELBOW - COMPLETE 3+ VIEW
4 series · 4 of 4 positions shown · non-contrast
Comparison: None.

CLINICAL DATA: Elbow soft tissue abscess, possible foreign body,
initial encounter

EXAM:
LEFT ELBOW - COMPLETE 3+ VIEW

[elbow ap]
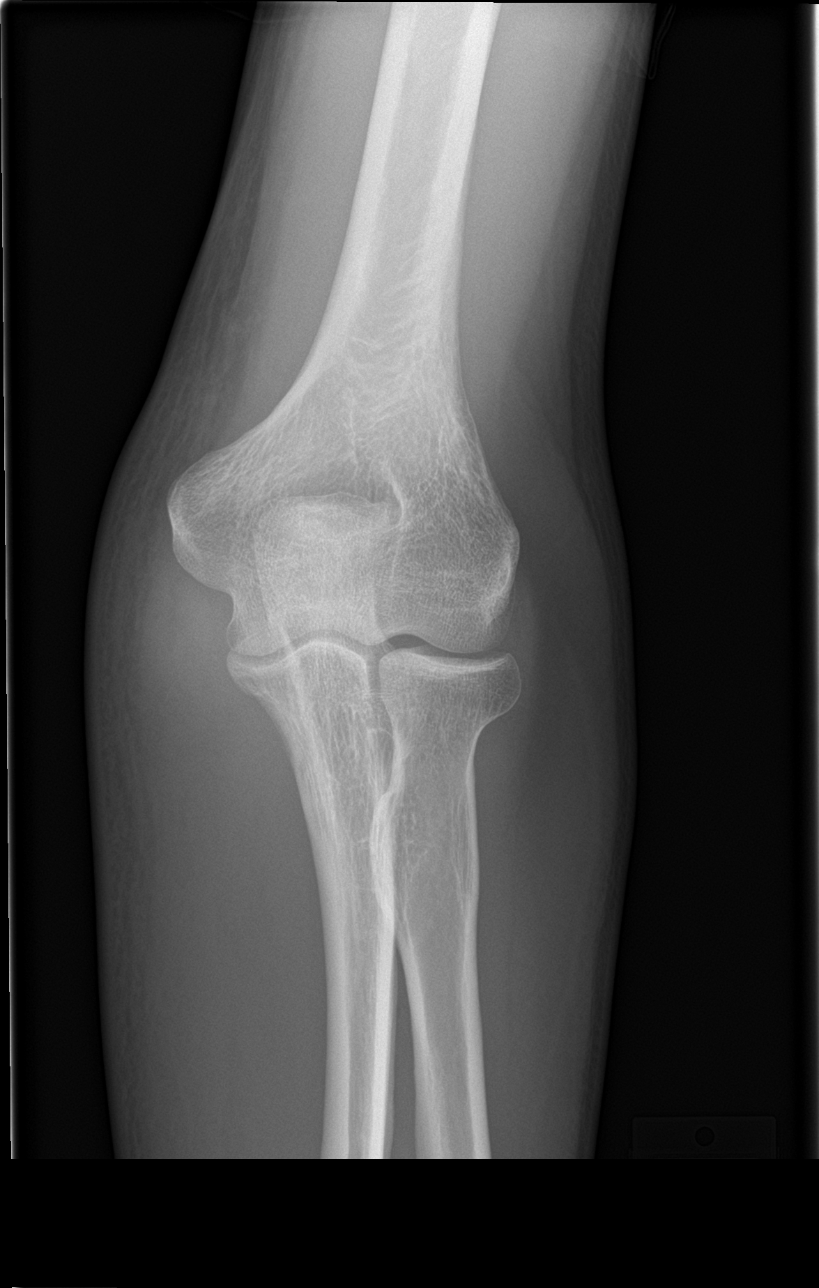

[elbow obl (1 of 2)]
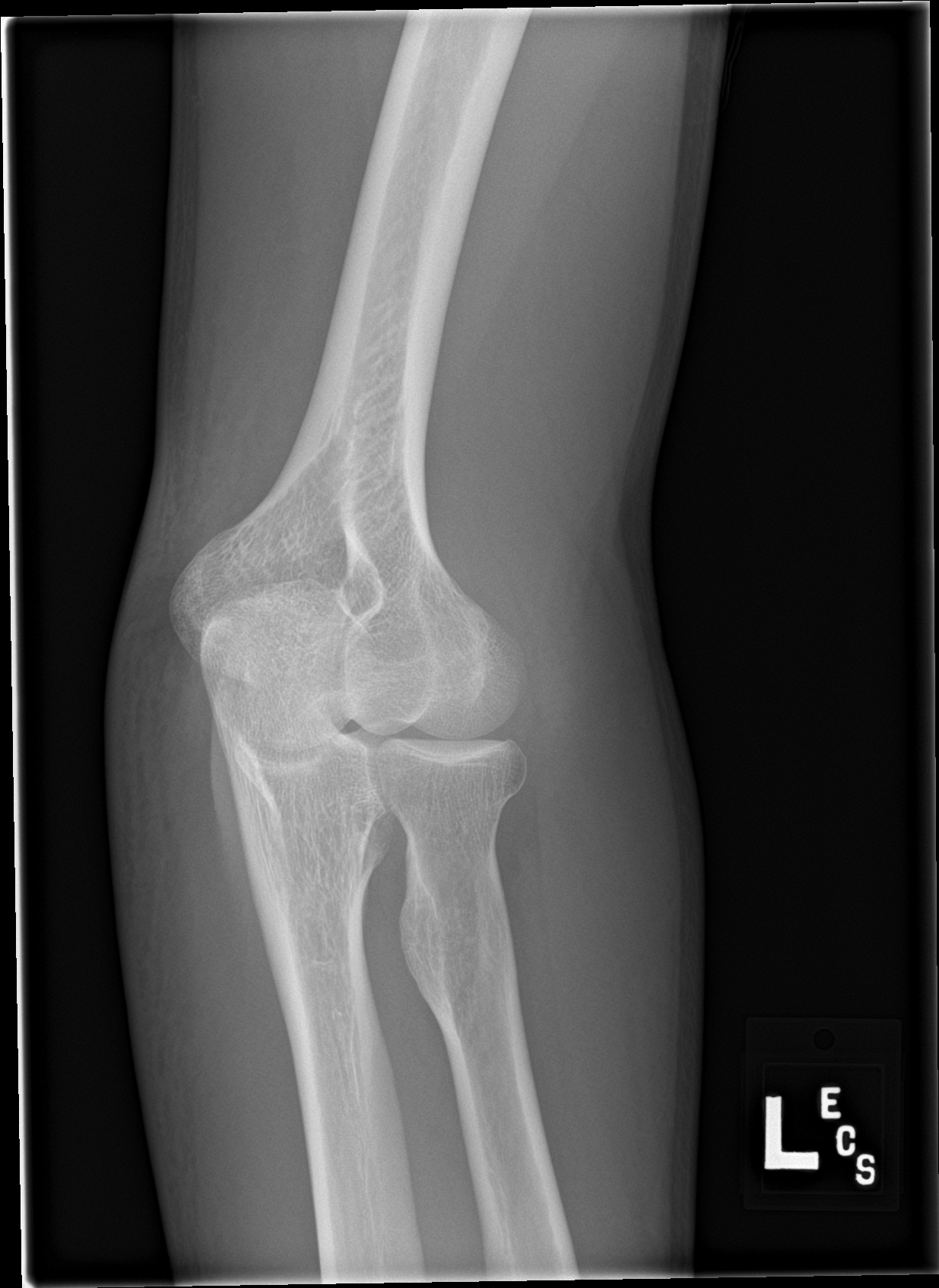

[elbow obl (2 of 2)]
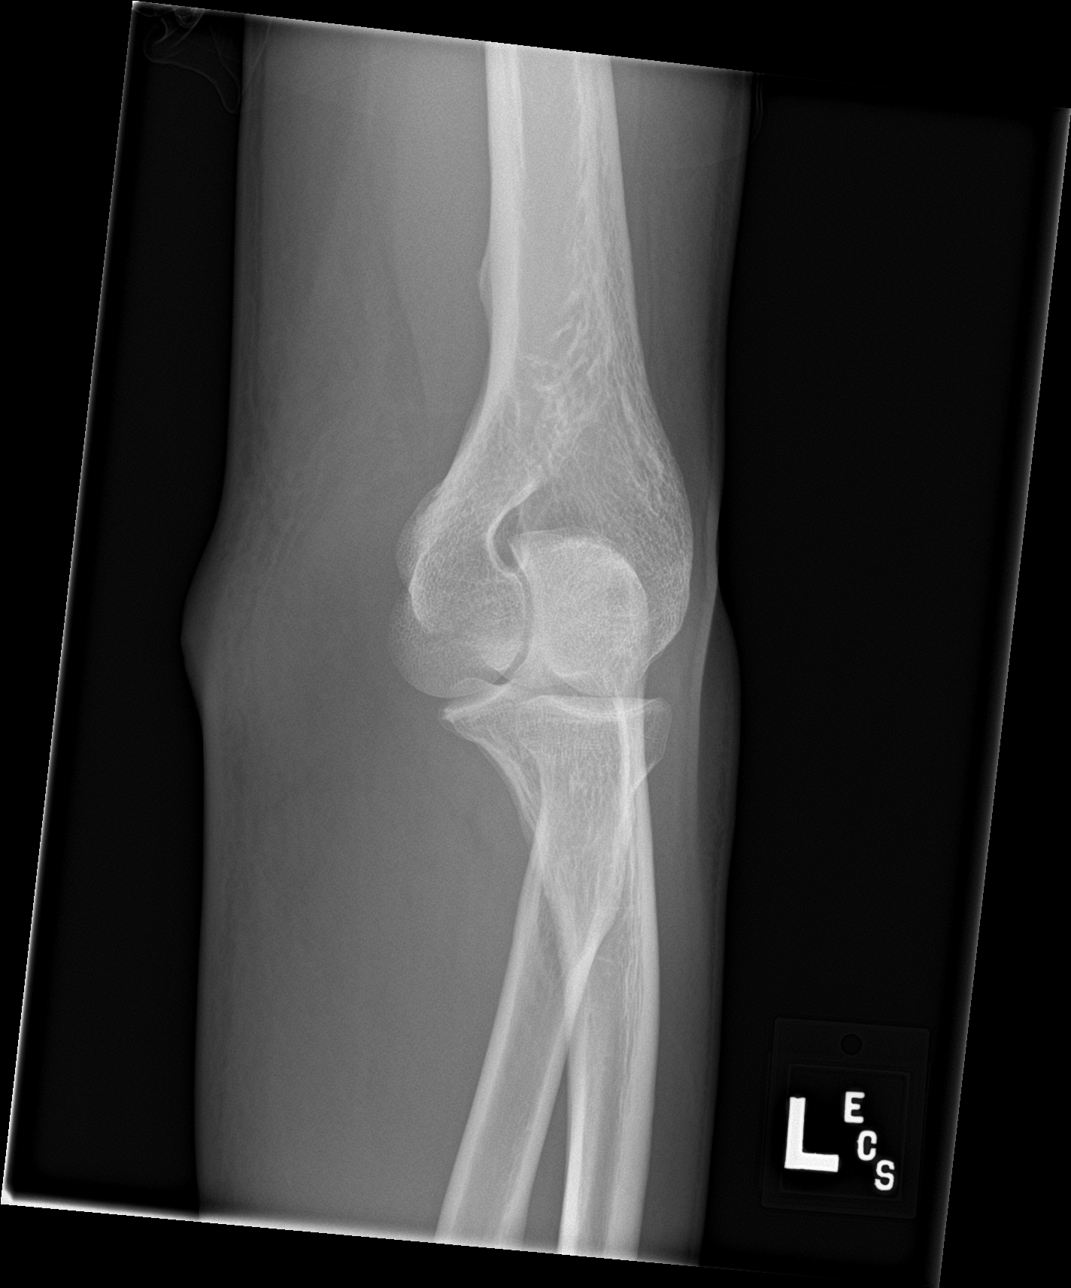

[elbow lat]
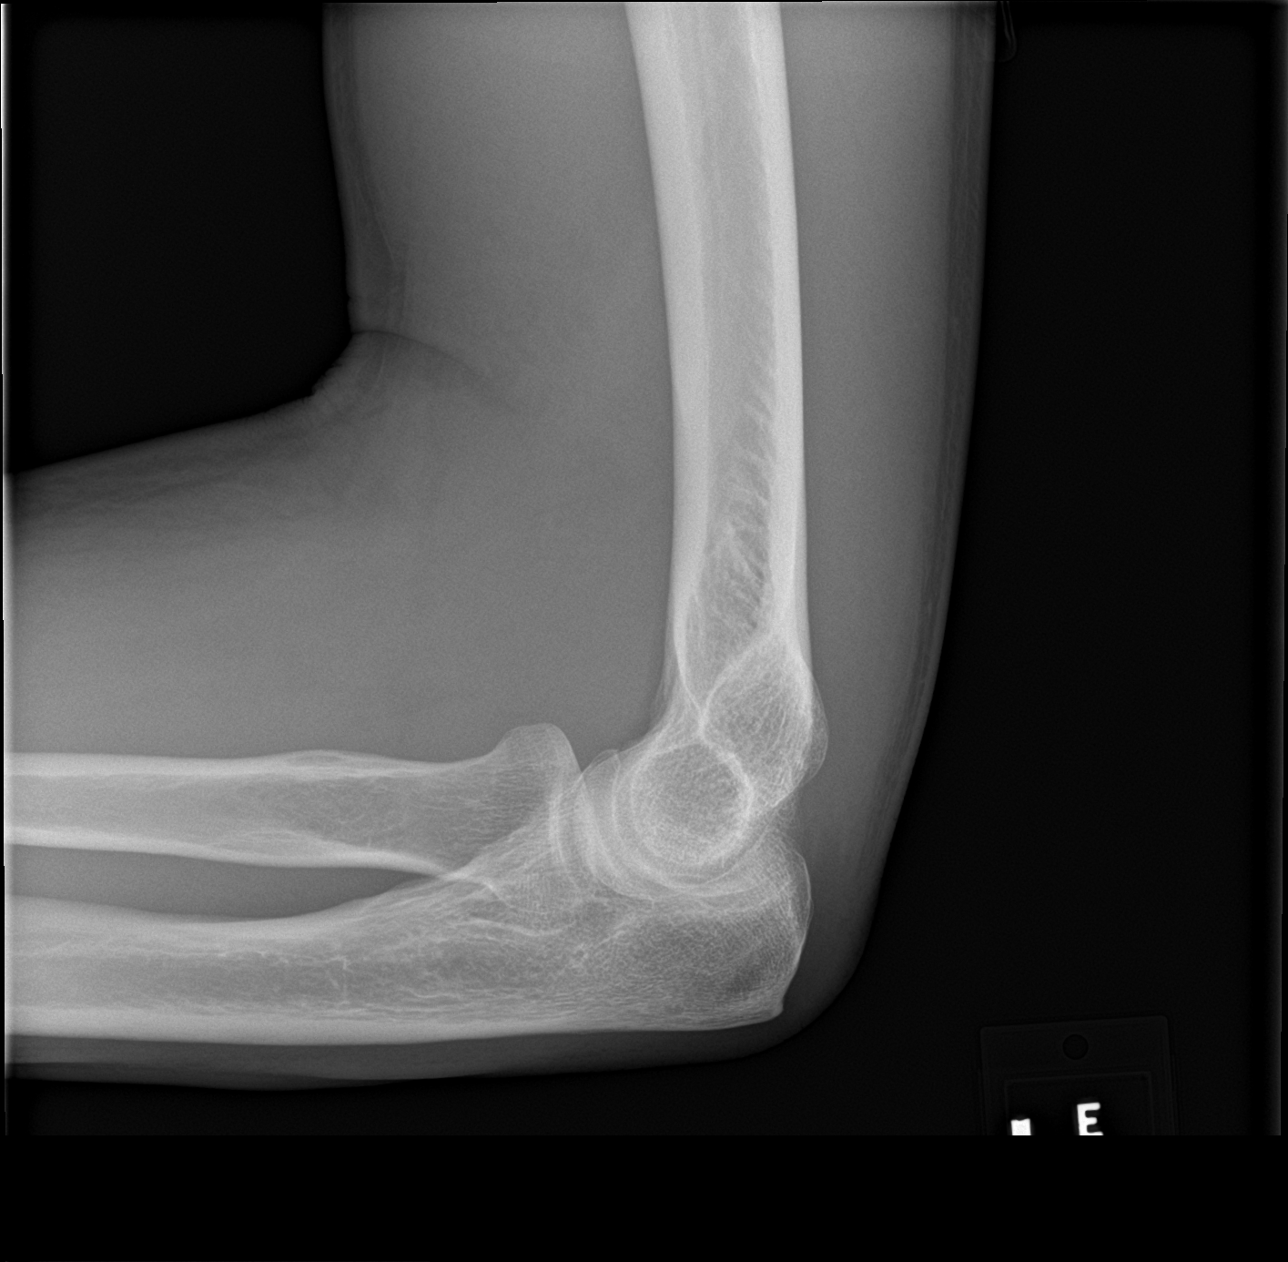

[4 of 4 positions shown; findings below may reference images not displayed]

FINDINGS: No acute fracture or dislocation is noted. Soft tissue prominence is
noted consistent with subcutaneous abscess. No definitive radiopaque
foreign body is noted.
IMPRESSION: Soft tissue abnormality without radiopaque foreign body or bony
abnormality.

## 2023-06-07 ENCOUNTER — Encounter (HOSPITAL_COMMUNITY): Payer: Self-pay | Admitting: Emergency Medicine

## 2023-06-07 ENCOUNTER — Emergency Department (HOSPITAL_COMMUNITY): Payer: 59

## 2023-06-07 ENCOUNTER — Inpatient Hospital Stay (HOSPITAL_COMMUNITY)
Admission: EM | Admit: 2023-06-07 | Discharge: 2023-06-10 | DRG: 603 | Discharge status: Against Medical Advice | Payer: 59 | Attending: Internal Medicine | Admitting: Internal Medicine

## 2023-06-07 DIAGNOSIS — L03115 Cellulitis of right lower limb: Secondary | ICD-10-CM | POA: Diagnosis not present

## 2023-06-07 DIAGNOSIS — L089 Local infection of the skin and subcutaneous tissue, unspecified: Secondary | ICD-10-CM | POA: Diagnosis present

## 2023-06-07 DIAGNOSIS — L02611 Cutaneous abscess of right foot: Secondary | ICD-10-CM | POA: Diagnosis present

## 2023-06-07 DIAGNOSIS — Z765 Malingerer [conscious simulation]: Secondary | ICD-10-CM

## 2023-06-07 DIAGNOSIS — F1911 Other psychoactive substance abuse, in remission: Secondary | ICD-10-CM | POA: Diagnosis present

## 2023-06-07 DIAGNOSIS — B182 Chronic viral hepatitis C: Secondary | ICD-10-CM | POA: Diagnosis present

## 2023-06-07 DIAGNOSIS — E872 Acidosis, unspecified: Secondary | ICD-10-CM | POA: Diagnosis present

## 2023-06-07 DIAGNOSIS — F1990 Other psychoactive substance use, unspecified, uncomplicated: Secondary | ICD-10-CM | POA: Diagnosis present

## 2023-06-07 DIAGNOSIS — L97519 Non-pressure chronic ulcer of other part of right foot with unspecified severity: Secondary | ICD-10-CM | POA: Diagnosis present

## 2023-06-07 DIAGNOSIS — Z885 Allergy status to narcotic agent status: Secondary | ICD-10-CM

## 2023-06-07 DIAGNOSIS — Z5329 Procedure and treatment not carried out because of patient's decision for other reasons: Secondary | ICD-10-CM | POA: Diagnosis not present

## 2023-06-07 DIAGNOSIS — F1910 Other psychoactive substance abuse, uncomplicated: Secondary | ICD-10-CM

## 2023-06-07 DIAGNOSIS — Z79899 Other long term (current) drug therapy: Secondary | ICD-10-CM

## 2023-06-07 DIAGNOSIS — S91301A Unspecified open wound, right foot, initial encounter: Principal | ICD-10-CM

## 2023-06-07 DIAGNOSIS — Z8701 Personal history of pneumonia (recurrent): Secondary | ICD-10-CM

## 2023-06-07 DIAGNOSIS — F17210 Nicotine dependence, cigarettes, uncomplicated: Secondary | ICD-10-CM | POA: Diagnosis present

## 2023-06-07 LAB — I-STAT CG4 LACTIC ACID, ED: Lactic Acid, Venous: 2.8 mmol/L (ref 0.5–1.9)

## 2023-06-07 NOTE — ED Notes (Signed)
Lab unable to obtain labs

## 2023-06-07 NOTE — ED Notes (Signed)
Patient transported to X-ray 

## 2023-06-07 NOTE — ED Notes (Signed)
Lab obtained labs and 1 set of cultures. Unable to get IV started.

## 2023-06-07 NOTE — ED Triage Notes (Addendum)
Pt reports R foot infection for week. Entire foot is red and swollen and large area on top of foot extending to ankle. Large are of open sore with drainage. Hx of multiple infections to extremities. IV drug user.

## 2023-06-08 ENCOUNTER — Inpatient Hospital Stay (HOSPITAL_COMMUNITY): Payer: 59

## 2023-06-08 ENCOUNTER — Other Ambulatory Visit: Payer: Self-pay

## 2023-06-08 DIAGNOSIS — S91301A Unspecified open wound, right foot, initial encounter: Secondary | ICD-10-CM | POA: Diagnosis not present

## 2023-06-08 DIAGNOSIS — Z8701 Personal history of pneumonia (recurrent): Secondary | ICD-10-CM | POA: Diagnosis not present

## 2023-06-08 DIAGNOSIS — Z79899 Other long term (current) drug therapy: Secondary | ICD-10-CM | POA: Diagnosis not present

## 2023-06-08 DIAGNOSIS — Z5329 Procedure and treatment not carried out because of patient's decision for other reasons: Secondary | ICD-10-CM | POA: Diagnosis not present

## 2023-06-08 DIAGNOSIS — F1910 Other psychoactive substance abuse, uncomplicated: Secondary | ICD-10-CM

## 2023-06-08 DIAGNOSIS — L089 Local infection of the skin and subcutaneous tissue, unspecified: Secondary | ICD-10-CM | POA: Diagnosis present

## 2023-06-08 DIAGNOSIS — F17210 Nicotine dependence, cigarettes, uncomplicated: Secondary | ICD-10-CM | POA: Diagnosis present

## 2023-06-08 DIAGNOSIS — Z765 Malingerer [conscious simulation]: Secondary | ICD-10-CM | POA: Diagnosis not present

## 2023-06-08 DIAGNOSIS — L03115 Cellulitis of right lower limb: Secondary | ICD-10-CM | POA: Diagnosis present

## 2023-06-08 DIAGNOSIS — E872 Acidosis, unspecified: Secondary | ICD-10-CM | POA: Diagnosis present

## 2023-06-08 DIAGNOSIS — B182 Chronic viral hepatitis C: Secondary | ICD-10-CM | POA: Diagnosis present

## 2023-06-08 DIAGNOSIS — L02611 Cutaneous abscess of right foot: Secondary | ICD-10-CM | POA: Diagnosis present

## 2023-06-08 DIAGNOSIS — Z885 Allergy status to narcotic agent status: Secondary | ICD-10-CM | POA: Diagnosis not present

## 2023-06-08 DIAGNOSIS — L97519 Non-pressure chronic ulcer of other part of right foot with unspecified severity: Secondary | ICD-10-CM | POA: Diagnosis present

## 2023-06-08 DIAGNOSIS — F1911 Other psychoactive substance abuse, in remission: Secondary | ICD-10-CM | POA: Diagnosis present

## 2023-06-08 LAB — COMPREHENSIVE METABOLIC PANEL
ALT: 30 U/L (ref 0–44)
AST: 39 U/L (ref 15–41)
Albumin: 3.2 g/dL — ABNORMAL LOW (ref 3.5–5.0)
Alkaline Phosphatase: 75 U/L (ref 38–126)
Anion gap: 12 (ref 5–15)
BUN: 11 mg/dL (ref 6–20)
CO2: 27 mmol/L (ref 22–32)
Calcium: 9.1 mg/dL (ref 8.9–10.3)
Chloride: 100 mmol/L (ref 98–111)
Creatinine, Ser: 0.79 mg/dL (ref 0.61–1.24)
GFR, Estimated: >60 mL/min (ref >60)
Glucose, Bld: 101 mg/dL — ABNORMAL HIGH (ref 70–99)
Potassium: 3.8 mmol/L (ref 3.5–5.1)
Sodium: 139 mmol/L (ref 135–145)
Total Bilirubin: 0.9 mg/dL (ref 0.3–1.2)
Total Protein: 8.4 g/dL — ABNORMAL HIGH (ref 6.5–8.1)

## 2023-06-08 LAB — CBC WITH DIFFERENTIAL/PLATELET
Abs Immature Granulocytes: 0.04 10*3/uL (ref 0.00–0.07)
Basophils Absolute: 0.1 10*3/uL (ref 0.0–0.1)
Basophils Relative: 1 %
Eosinophils Absolute: 0.3 10*3/uL (ref 0.0–0.5)
Eosinophils Relative: 3 %
HCT: 43.9 % (ref 39.0–52.0)
Hemoglobin: 14.3 g/dL (ref 13.0–17.0)
Immature Granulocytes: 0 %
Lymphocytes Relative: 21 %
Lymphs Abs: 2.3 10*3/uL (ref 0.7–4.0)
MCH: 27 pg (ref 26.0–34.0)
MCHC: 32.6 g/dL (ref 30.0–36.0)
MCV: 82.8 fL (ref 80.0–100.0)
Monocytes Absolute: 0.9 10*3/uL (ref 0.1–1.0)
Monocytes Relative: 9 %
Neutro Abs: 7.1 10*3/uL (ref 1.7–7.7)
Neutrophils Relative %: 66 %
Platelets: 359 10*3/uL (ref 150–400)
RBC: 5.3 MIL/uL (ref 4.22–5.81)
RDW: 12.4 % (ref 11.5–15.5)
WBC: 10.8 10*3/uL — ABNORMAL HIGH (ref 4.0–10.5)
nRBC: 0 % (ref 0.0–0.2)

## 2023-06-08 LAB — HEMOGLOBIN A1C
Hgb A1c MFr Bld: 5.5 % (ref 4.8–5.6)
Mean Plasma Glucose: 111.15 mg/dL

## 2023-06-08 LAB — CBC
HCT: 36.1 % — ABNORMAL LOW (ref 39.0–52.0)
Hemoglobin: 11.8 g/dL — ABNORMAL LOW (ref 13.0–17.0)
MCH: 27.6 pg (ref 26.0–34.0)
MCHC: 32.7 g/dL (ref 30.0–36.0)
MCV: 84.3 fL (ref 80.0–100.0)
Platelets: 291 10*3/uL (ref 150–400)
RBC: 4.28 MIL/uL (ref 4.22–5.81)
RDW: 12.8 % (ref 11.5–15.5)
WBC: 6.9 10*3/uL (ref 4.0–10.5)
nRBC: 0 % (ref 0.0–0.2)

## 2023-06-08 LAB — HIV ANTIBODY (ROUTINE TESTING W REFLEX): HIV Screen 4th Generation wRfx: NONREACTIVE

## 2023-06-08 LAB — C-REACTIVE PROTEIN: CRP: 19.9 mg/dL — ABNORMAL HIGH (ref <1.0)

## 2023-06-08 LAB — SEDIMENTATION RATE: Sed Rate: 46 mm/h — ABNORMAL HIGH (ref 0–16)

## 2023-06-08 LAB — PREALBUMIN: Prealbumin: <5 mg/dL — ABNORMAL LOW (ref 18–38)

## 2023-06-08 LAB — I-STAT CG4 LACTIC ACID, ED: Lactic Acid, Venous: 1.4 mmol/L (ref 0.5–1.9)

## 2023-06-08 MED ORDER — FENTANYL CITRATE PF 50 MCG/ML IJ SOSY: 12.5000 ug | PREFILLED_SYRINGE | Freq: Once | INTRAMUSCULAR | Status: DC

## 2023-06-08 MED ORDER — SODIUM CHLORIDE 0.9 % IV SOLN: 2.0000 g | Freq: Once | INTRAVENOUS | Status: DC

## 2023-06-08 MED ORDER — ORAL CARE MOUTH RINSE: 15.0000 mL | OROMUCOSAL | Status: DC | PRN

## 2023-06-08 MED ORDER — SODIUM CHLORIDE 0.9 % IV SOLN
2.0000 g | Freq: Three times a day (TID) | INTRAVENOUS | Status: DC
  Administered 2023-06-08: 2 g via INTRAVENOUS
  Filled 2023-06-08: qty 12.5

## 2023-06-08 MED ORDER — VANCOMYCIN HCL 1750 MG/350ML IV SOLN
1750.0000 mg | Freq: Once | INTRAVENOUS | Status: AC
  Administered 2023-06-08: 1750 mg via INTRAVENOUS
  Filled 2023-06-08: qty 350

## 2023-06-08 MED ORDER — KETOROLAC TROMETHAMINE 15 MG/ML IJ SOLN
15.0000 mg | Freq: Four times a day (QID) | INTRAMUSCULAR | Status: DC | PRN
  Administered 2023-06-09: 15 mg via INTRAVENOUS
  Filled 2023-06-08 (×3): qty 1

## 2023-06-08 MED ORDER — ACETAMINOPHEN 325 MG PO TABS: 650.0000 mg | ORAL_TABLET | Freq: Four times a day (QID) | ORAL | Status: DC | PRN

## 2023-06-08 MED ORDER — SODIUM CHLORIDE 0.9 % IV SOLN
2.0000 g | INTRAVENOUS | Status: DC
  Administered 2023-06-08 – 2023-06-09 (×2): 2 g via INTRAVENOUS
  Filled 2023-06-08 (×2): qty 20

## 2023-06-08 MED ORDER — SODIUM CHLORIDE 0.9 % IV BOLUS
1000.0000 mL | Freq: Once | INTRAVENOUS | Status: AC
  Administered 2023-06-08: 1000 mL via INTRAVENOUS

## 2023-06-08 MED ORDER — FENTANYL CITRATE PF 50 MCG/ML IJ SOSY
25.0000 ug | PREFILLED_SYRINGE | Freq: Once | INTRAMUSCULAR | Status: AC
  Administered 2023-06-08: 25 ug via INTRAVENOUS
  Filled 2023-06-08: qty 1

## 2023-06-08 MED ORDER — ACETAMINOPHEN 650 MG RE SUPP: 650.0000 mg | Freq: Four times a day (QID) | RECTAL | Status: DC | PRN

## 2023-06-08 MED ORDER — VANCOMYCIN HCL 1750 MG/350ML IV SOLN
1750.0000 mg | Freq: Once | INTRAVENOUS | Status: DC
  Filled 2023-06-08: qty 350

## 2023-06-08 MED ORDER — TRAMADOL HCL 50 MG PO TABS
50.0000 mg | ORAL_TABLET | Freq: Four times a day (QID) | ORAL | Status: DC | PRN
  Filled 2023-06-08: qty 1

## 2023-06-08 MED ORDER — PANTOPRAZOLE SODIUM 40 MG PO TBEC
40.0000 mg | DELAYED_RELEASE_TABLET | Freq: Two times a day (BID) | ORAL | Status: DC
  Administered 2023-06-08: 40 mg via ORAL
  Filled 2023-06-08 (×4): qty 1

## 2023-06-08 MED ORDER — VANCOMYCIN HCL 1500 MG/300ML IV SOLN
1500.0000 mg | Freq: Two times a day (BID) | INTRAVENOUS | Status: DC
  Administered 2023-06-08 – 2023-06-10 (×4): 1500 mg via INTRAVENOUS
  Filled 2023-06-08 (×6): qty 300

## 2023-06-08 NOTE — ED Notes (Signed)
Bed alarm not available

## 2023-06-08 NOTE — ED Notes (Signed)
This Clinical research associate contacted 4E and informed Belinda that PT was on the way up.

## 2023-06-08 NOTE — Progress Notes (Signed)
[facility] PROGRESS NOTE    Progress Note  Jon Castro  UJW:[id number] DOB: December 06, 1985 DOA: 06/07/2023 PCP: Patient, [name] Per     Brief Narrative:   Jon Castro is an 37 y.o. male past medical history significant for polysubstance abuse/IVDA, MRSA bacteremia and candidemia hepatitis C major depressive disorder with psychosis comes in complaining of right foot cellulitis, with a small open wound wound with pus drainage.  Mild leukocytosis and lactic acidosis on admission blood cultures were ordered x-ray of the foot showed diffuse soft tissue edema no gas or osseous findings.  For resuscitated started on antibiotics.  Assessment/Plan:   Purulent Cellulitis of right foot: No signs of sepsis.  Showed soft tissue edema no foreign body or osteomyelitis.  Wound is more than 6 cm and has been there in 2 weeks. A1c 5.5, ESR and CRP 19 Started empirically on admission IV bank and cefepime and fluids as needed. Blood cultures have been ordered. Check an MRI of the foot.  HIV test is pending.  IVDA: Relates he has not been using heroin for the last year.   DVT prophylaxis: lovenox Family Communication:none Status is: Inpatient Remains inpatient appropriate because: Right foot cellulitis    Code Status:     Code Status Orders  (From admission, onward)           Start     Ordered   06/08/23 0446  Full code  Continuous       Question:  By:  Answer:  Consent: discussion documented in EHR   06/08/23 0447           Code Status History     Date Active Date Inactive Code Status Order ID Comments User Context   04/30/2020 0950 05/05/2020 1903 Full Code [id number]  Claudean Severance, MD ED   04/01/2020 1324 04/22/2020 2130 Full Code [id number]  Yvette Rack, MD ED   01/20/2020 0721 01/20/2020 1823 Full Code [id number]  Anselm Pancoast, PA-C ED   01/02/2020 0314 01/03/2020 1728 Full Code [id number]  Garlon Hatchet, PA-C ED   10/30/2019 1252 11/03/2019 1646 Full Code  [id number]  Aldean Baker, NP Inpatient   10/30/2019 0226 10/30/2019 1156 Full Code [id number]  Zadie Rhine, MD ED   10/13/2019 0241 10/15/2019 1727 Full Code [id number]  Pearson Grippe, MD ED   05/30/2019 1418 05/31/2019 1912 Full Code [id number]  Cristina Gong, PA-C ED   04/28/2019 0334 05/19/2019 1839 Full Code [id number]  Rometta Emery, MD Inpatient   04/22/2019 0253 04/23/2019 1454 Full Code [id number]  Marily Memos, MD ED   02/21/2019 2325 02/22/2019 1828 Full Code [id number]  Garlon Hatchet, PA-C ED   02/07/2019 2000 02/11/2019 1553 Full Code [id number]  Money, Gerlene Burdock, FNP Inpatient         IV Access:   Peripheral IV   Procedures and diagnostic studies:   DG Foot Complete Right  Result Date: 06/07/2023 CLINICAL DATA:  Wound and swelling. EXAM: RIGHT FOOT COMPLETE - 3+ VIEW COMPARISON:  None Available. FINDINGS: There is no evidence of fracture or dislocation. No erosive change, periostitis or bone destruction. There is no evidence of arthropathy or other focal bone abnormality. A dressing overlies the midfoot. Diffuse soft tissue edema about the dorsal and lateral midfoot. No soft tissue gas or radiopaque foreign body allowing for overlying dressing. IMPRESSION: Diffuse soft tissue edema about the dorsal and lateral midfoot. No soft tissue gas or radiopaque foreign body. No acute osseous findings. Electronically  Signed   By: Narda Rutherford M.D.   On: 06/07/2023 23:50     Medical Consultants:   None.   Subjective:    Jon Castro relates his pain is controlled.  Objective:    Vitals:   06/08/23 0345 06/08/23 0400 06/08/23 0410 06/08/23 0415  BP: 112/76 94/63  108/66  Pulse: 79 77  84  Resp:  16    Temp:  97.9 F (36.6 C)    SpO2: 97% 100%  100%  Weight:   79.4 kg   Height:   6\' 1"  (1.854 m)    SpO2: 100 %   Intake/Output Summary (Last 24 hours) at 06/08/2023 0706 Last data filed at 06/08/2023 0640 Gross per 24 hour  Intake 1100 ml  Output --  Net  1100 ml   Filed Weights   06/08/23 0410  Weight: 79.4 kg    Exam: General exam: In no acute distress. Respiratory system: Good air movement and clear to auscultation. Cardiovascular system: S1 & S2 heard, RRR. No JVD.  Gastrointestinal system: Abdomen is nondistended, soft and nontender.  Extremities: No pedal edema. Skin: No rashes, lesions or ulcers Psychiatry: Judgement and insight appear normal. Mood & affect appropriate.    Data Reviewed:    Labs: Basic Metabolic Panel: Recent Labs  Lab 06/07/23 2239  NA 139  K 3.8  CL 100  CO2 27  GLUCOSE 101*  BUN 11  CREATININE 0.79  CALCIUM 9.1   GFR Estimated Creatinine Clearance: 140.6 mL/min (by C-G formula based on SCr of 0.79 mg/dL). Liver Function Tests: Recent Labs  Lab 06/07/23 2239  AST 39  ALT 30  ALKPHOS 75  BILITOT 0.9  PROT 8.4*  ALBUMIN 3.2*   No results for input(s): "LIPASE", "AMYLASE" in the last 168 hours. No results for input(s): "AMMONIA" in the last 168 hours. Coagulation profile No results for input(s): "INR", "PROTIME" in the last 168 hours. COVID-19 Labs  Recent Labs    06/08/23 0506  CRP 19.9*    Lab Results  Component Value Date   SARSCOV2NAA POSITIVE (A) 04/07/2020   SARSCOV2NAA POSITIVE (A) 04/01/2020   SARSCOV2NAA NEGATIVE 01/20/2020   SARSCOV2NAA NEGATIVE 01/02/2020    CBC: Recent Labs  Lab 06/07/23 2239  WBC 10.8*  NEUTROABS 7.1  HGB 14.3  HCT 43.9  MCV 82.8  PLT 359   Cardiac Enzymes: No results for input(s): "CKTOTAL", "CKMB", "CKMBINDEX", "TROPONINI" in the last 168 hours. BNP (last 3 results) No results for input(s): "PROBNP" in the last 8760 hours. CBG: No results for input(s): "GLUCAP" in the last 168 hours. D-Dimer: No results for input(s): "DDIMER" in the last 72 hours. Hgb A1c: Recent Labs    06/08/23 0506  HGBA1C 5.5   Lipid Profile: No results for input(s): "CHOL", "HDL", "LDLCALC", "TRIG", "CHOLHDL", "LDLDIRECT" in the last 72  hours. Thyroid function studies: No results for input(s): "TSH", "T4TOTAL", "T3FREE", "THYROIDAB" in the last 72 hours.  Invalid input(s): "FREET3" Anemia work up: No results for input(s): "VITAMINB12", "FOLATE", "FERRITIN", "TIBC", "IRON", "RETICCTPCT" in the last 72 hours. Sepsis Labs: Recent Labs  Lab 06/07/23 2239 06/07/23 2321 06/08/23 0545  WBC 10.8*  --   --   LATICACIDVEN  --  2.8* 1.4   Microbiology No results found for this or any previous visit (from the past 240 hour(s)).   Medications:    Continuous Infusions:  ceFEPime (MAXIPIME) IV Stopped (06/08/23 1610)   vancomycin     vancomycin 1,750 mg (06/08/23 0647)  LOS: 0 days   Marinda Elk  [facility]  06/08/2023, 7:06 AM

## 2023-06-08 NOTE — ED Provider Notes (Signed)
[place] EMERGENCY DEPARTMENT AT [facility] Provider Note   CSN: [id number] Arrival date & time: 06/07/23  2219     History  Chief Complaint  Patient presents with   Foot Injury        HPI Jon Castro is a 37 y.o. male with history of IVDU, chronic hepatitis C, history of bacteremia and endocarditis presenting for right foot infection.  States about a week ago he noticed that his right foot around the midfoot was getting red and swollen.  Swelling seem to be getting worse and now states he has noticed an open sore on the lateral midfoot.  Sore is painful to touch.  Denies fever or chills.  Denies nausea vomiting.  Denies calf tenderness, shortness of breath and chest pain.   Foot Injury      Home Medications Prior to Admission medications   Medication Sig Start Date End Date Taking? Authorizing Provider  cloNIDine (CATAPRES) 0.1 MG tablet Take 1 tablet (0.1 mg total) by mouth 3 (three) times daily for 5 days. Patient not taking: Reported on 03/24/2019 03/08/19 03/24/19  Virgina Norfolk, DO  traZODone (DESYREL) 50 MG tablet Take 1 tablet (50 mg total) by mouth at bedtime as needed for sleep. Patient not taking: Reported on 02/21/2019 02/11/19 03/24/19  Aldean Baker, NP      Allergies    Codeine, Hydrocodone, and Tramadol    Review of Systems   See HPI for pertinent positives  Physical Exam Updated Vital Signs BP 94/63 (BP Location: Right Arm)   Pulse 77   Temp 97.9 F (36.6 C)   Resp 16   Ht 6\' 1"  (1.854 m)   Wt 79.4 kg   SpO2 100%   BMI 23.09 kg/m  Physical Exam Constitutional:      Appearance: Normal appearance.  HENT:     Head: Normocephalic.     Nose: Nose normal.  Eyes:     Conjunctiva/sclera: Conjunctivae normal.  Pulmonary:     Effort: Pulmonary effort is normal.  Musculoskeletal:     Right foot: Normal range of motion and normal capillary refill. Swelling and tenderness present. Normal pulse.     Left foot: Normal capillary  refill. Normal pulse.     Comments: Open lesion noted in the right foot.  See picture below.  Neurological:     Mental Status: He is alert.  Psychiatric:        Mood and Affect: Mood normal.      ED Results / Procedures / Treatments   Labs (all labs ordered are listed, but only abnormal results are displayed) Labs Reviewed  COMPREHENSIVE METABOLIC PANEL - Abnormal; Notable for the following components:      Result Value   Glucose, Bld 101 (*)    Total Protein 8.4 (*)    Albumin 3.2 (*)    All other components within normal limits  CBC WITH DIFFERENTIAL/PLATELET - Abnormal; Notable for the following components:   WBC 10.8 (*)    All other components within normal limits  I-STAT CG4 LACTIC ACID, ED - Abnormal; Notable for the following components:   Lactic Acid, Venous 2.8 (*)    All other components within normal limits  CULTURE, BLOOD (ROUTINE X 2)  CULTURE, BLOOD (ROUTINE X 2)  HEMOGLOBIN A1C  C-REACTIVE PROTEIN  CBC  SEDIMENTATION RATE  HIV ANTIBODY (ROUTINE TESTING W REFLEX)  PREALBUMIN  I-STAT CG4 LACTIC ACID, ED    EKG None  Radiology DG Foot Complete Right  Result Date: 06/07/2023 CLINICAL DATA:  Wound and swelling. EXAM: RIGHT FOOT COMPLETE - 3+ VIEW COMPARISON:  None Available. FINDINGS: There is no evidence of fracture or dislocation. No erosive change, periostitis or bone destruction. There is no evidence of arthropathy or other focal bone abnormality. A dressing overlies the midfoot. Diffuse soft tissue edema about the dorsal and lateral midfoot. No soft tissue gas or radiopaque foreign body allowing for overlying dressing. IMPRESSION: Diffuse soft tissue edema about the dorsal and lateral midfoot. No soft tissue gas or radiopaque foreign body. No acute osseous findings. Electronically Signed   By: Narda Rutherford M.D.   On: 06/07/2023 23:50    Procedures .Critical Care  Performed by: Gareth Eagle, PA-C Authorized by: Gareth Eagle, PA-C   Critical  care provider statement:    Critical care time (minutes):  30   Critical care was necessary to treat or prevent imminent or life-threatening deterioration of the following conditions:  Sepsis   Critical care was time spent personally by me on the following activities:  Development of treatment plan with patient or surrogate, discussions with consultants, evaluation of patient's response to treatment, examination of patient, ordering and review of laboratory studies, ordering and review of radiographic studies, ordering and performing treatments and interventions, pulse oximetry, re-evaluation of patient's condition and review of old charts     Medications Ordered in ED Medications  acetaminophen (TYLENOL) tablet 650 mg (has no administration in time range)    Or  acetaminophen (TYLENOL) suppository 650 mg (has no administration in time range)  ceFEPIme (MAXIPIME) 2 g in sodium chloride 0.9 % 100 mL IVPB (2 g Intravenous New Bag/Given 06/08/23 0534)  vancomycin (VANCOREADY) IVPB 1750 mg/350 mL (has no administration in time range)  vancomycin (VANCOREADY) IVPB 1500 mg/300 mL (has no administration in time range)  sodium chloride 0.9 % bolus 1,000 mL (1,000 mLs Intravenous New Bag/Given 06/08/23 0533)    ED Course/ Medical Decision Making/ A&P                                 Medical Decision Making Amount and/or Complexity of Data Reviewed Labs: ordered. Radiology: ordered.  Risk Decision regarding hospitalization.   Initial Impression and Ddx 37 year old well-appearing male presenting for right foot infection.  Exam notable for swelling, erythema and necrotic appearing open wound on the right midfoot.  DDx includes cellulitis, osteomyelitis, necrotizing fasciitis, abscess, sepsis, bacteremia, other. Patient PMH that increases complexity of ED encounter:  history of IVDU, chronic hepatitis C, history of bacteremia and endocarditis  Interpretation of Diagnostics - I independent  reviewed and interpreted the labs as followed: Lactic acidosis, leukocytosis  - I independently visualized the following imaging with scope of interpretation limited to determining acute life threatening conditions related to emergency care: rt foot xray, which revealed soft tissue edema about the dorsal and lateral mid right foot but no acute osseous abnormalities  Patient Reassessment and Ultimate Disposition/Management Clinical findings of the right foot coupled with lactic acidosis and leukocytosis concerning for cellulitis with or without sepsis.  Discussed patient with pharmacy who advised vancomycin and cefepime.  Blood cultures pending. Treated lactic acidosis with volume resuscitation.  Admitted to hospital service with Dr. Loney Loh.   Patient management required discussion with the following services or consulting groups:  Hospitalist Service  Complexity of Problems Addressed Acute complicated illness or Injury  Additional Data Reviewed and Analyzed Further history obtained from: Past medical  history and medications listed in the EMR and Prior ED visit notes  Patient Encounter Risk Assessment Consideration of hospitalization         Final Clinical Impression(s) / ED Diagnoses Final diagnoses:  Wound, open, foot, right, initial encounter    Rx / DC Orders ED Discharge Orders     None         Gareth Eagle, PA-C 06/08/23 1610    Shon Baton, MD 06/09/23 [phone]

## 2023-06-08 NOTE — ED Notes (Signed)
Attempts x 2 made to establish IV.  Unsuccessful at this time

## 2023-06-08 NOTE — H&P (Signed)
History and Physical    Lauris Serviss VHQ:[id number] DOB: 08-Nov-1985 DOA: 06/07/2023  PCP: Patient, [name] Per  Patient coming from: Home  Chief Complaint: Right foot infection  HPI: Jon Castro is a 37 y.o. male with medical history significant of polysubstance abuse/IVDU, MRSA bacteremia, candidemia, hep C, major depressive disorder with psychosis presenting with complaint of right foot infection.  Patient states it initially started as "a small bump" on his right foot which has now gotten progressively more swollen and red.  Now with an open wound with pus drainage.  Reports pain in his foot.  Denies fevers.  He thinks this problem might have started due to an insect or spider bite although he is not sure.  Denies injecting any drugs into his foot.  Patient states he has not used IV heroin for the past 1 year but continues to smoke marijuana.  Denies any other drug use.  He has no other complaints.  Denies cough, shortness breath, chest pain, abdominal pain, or vomiting.  ED Course: SBP 90-110s, afebrile and not tachycardic.  Labs significant for WBC 10.8, initial lactate 2.8 and repeat pending, blood cultures collected.  X-ray of right foot showing diffuse soft tissue edema but negative for soft tissue gas or acute osseous findings. Medications ordered in the ED include vancomycin, cefepime, and 1 L normal saline.  TR[facility] called to admit.  Review of Systems:  Review of Systems  All other systems reviewed and are negative.   Past Medical History:  Diagnosis Date   Back pain    Cellulitis 10/13/2019   Drug abuse (HCC)    Fungemia 05/02/2019   IV drug abuse (HCC)    IVDU (intravenous drug user)    LFTs abnormal    MRSA bacteremia 04/29/2019   Overdose of heroin, accidental or unintentional, initial encounter (HCC) 05/31/2019   Suicidal ideation 10/30/2019    Past Surgical History:  Procedure Laterality Date   I & D EXTREMITY Right 04/28/2019   Procedure: IRRIGATION AND  DEBRIDEMENT ARM ABSCESS;  Surgeon: Sheral Apley, MD;  Location: [facility] OR;  Service: Orthopedics;  Laterality: Right;   I & D EXTREMITY Right 04/01/2020   Procedure: IRRIGATION AND DEBRIDEMENT OF ELBOW;  Surgeon: Bradly Bienenstock, MD;  Location: [facility] OR;  Service: Orthopedics;  Laterality: Right;   TEE WITHOUT CARDIOVERSION N/A 05/03/2019   Procedure: TRANSESOPHAGEAL ECHOCARDIOGRAM (TEE);  Surgeon: Jake Bathe, MD;  Location: [facility] ENDOSCOPY;  Service: Cardiovascular;  Laterality: N/A;   TEE WITHOUT CARDIOVERSION N/A 04/14/2020   Procedure: TRANSESOPHAGEAL ECHOCARDIOGRAM (TEE);  Surgeon: Jodelle Red, MD;  Location: [facility] ENDOSCOPY;  Service: Cardiovascular;  Laterality: N/A;     reports that he has been smoking cigarettes. He has never used smokeless tobacco. He reports current alcohol use. He reports current drug use. Drugs: IV, Cocaine, Amphetamines, Marijuana, Heroin, and Methamphetamines.  Allergies  Allergen Reactions   Codeine Nausea And Vomiting   Hydrocodone Itching   Tramadol Other (See Comments)    "messes with head"    History reviewed. No pertinent family history.  Prior to Admission medications   Medication Sig Start Date End Date Taking? Authorizing Provider  DULoxetine (CYMBALTA) 30 MG capsule Take 1 capsule (30 mg total) by mouth daily. Patient not taking: Reported on 01/02/2020 11/04/19   Aldean Baker, NP  cloNIDine (CATAPRES) 0.1 MG tablet Take 1 tablet (0.1 mg total) by mouth 3 (three) times daily for 5 days. Patient not taking: Reported on 03/24/2019 03/08/19 03/24/19  Virgina Norfolk,  DO  traZODone (DESYREL) 50 MG tablet Take 1 tablet (50 mg total) by mouth at bedtime as needed for sleep. Patient not taking: Reported on 02/21/2019 02/11/19 03/24/19  Aldean Baker, NP    Physical Exam: Vitals:   06/08/23 0315 06/08/23 0330 06/08/23 0345 06/08/23 0400  BP: 103/71 119/79 112/76 94/63  Pulse: 67 78 79 77  Resp:    16  Temp:      SpO2: 99% 100% 97% 100%    Physical  Exam Vitals reviewed.  Constitutional:      General: He is not in acute distress. HENT:     Head: Normocephalic and atraumatic.  Eyes:     Extraocular Movements: Extraocular movements intact.  Cardiovascular:     Rate and Rhythm: Normal rate and regular rhythm.     Pulses: Normal pulses.  Pulmonary:     Effort: Pulmonary effort is normal. No respiratory distress.     Breath sounds: Normal breath sounds. No wheezing or rales.  Abdominal:     General: Bowel sounds are normal. There is no distension.     Palpations: Abdomen is soft.     Tenderness: There is no abdominal tenderness.  Musculoskeletal:     Cervical back: Normal range of motion.     Comments: Right foot swollen and erythematous with an open wound on the lateral dorsal aspect of the foot with purulent drainage and eschar.  Dorsalis pedis pulse intact.  Skin:    General: Skin is warm and dry.  Neurological:     General: No focal deficit present.     Mental Status: He is alert and oriented to person, place, and time.      Labs on Admission: I have personally reviewed following labs and imaging studies  CBC: Recent Labs  Lab 06/07/23 2239  WBC 10.8*  NEUTROABS 7.1  HGB 14.3  HCT 43.9  MCV 82.8  PLT 359   Basic Metabolic Panel: Recent Labs  Lab 06/07/23 2239  NA 139  K 3.8  CL 100  CO2 27  GLUCOSE 101*  BUN 11  CREATININE 0.79  CALCIUM 9.1   GFR: CrCl cannot be calculated (Unknown ideal weight.). Liver Function Tests: Recent Labs  Lab 06/07/23 2239  AST 39  ALT 30  ALKPHOS 75  BILITOT 0.9  PROT 8.4*  ALBUMIN 3.2*   No results for input(s): "LIPASE", "AMYLASE" in the last 168 hours. No results for input(s): "AMMONIA" in the last 168 hours. Coagulation Profile: No results for input(s): "INR", "PROTIME" in the last 168 hours. Cardiac Enzymes: No results for input(s): "CKTOTAL", "CKMB", "CKMBINDEX", "TROPONINI" in the last 168 hours. BNP (last 3 results) No results for input(s): "PROBNP" in  the last 8760 hours. HbA1C: No results for input(s): "HGBA1C" in the last 72 hours. CBG: No results for input(s): "GLUCAP" in the last 168 hours. Lipid Profile: No results for input(s): "CHOL", "HDL", "LDLCALC", "TRIG", "CHOLHDL", "LDLDIRECT" in the last 72 hours. Thyroid Function Tests: No results for input(s): "TSH", "T4TOTAL", "FREET4", "T3FREE", "THYROIDAB" in the last 72 hours. Anemia Panel: No results for input(s): "VITAMINB12", "FOLATE", "FERRITIN", "TIBC", "IRON", "RETICCTPCT" in the last 72 hours. Urine analysis:    Component Value Date/Time   COLORURINE AMBER (A) 04/30/2020 0448   APPEARANCEUR HAZY (A) 04/30/2020 0448   LABSPEC 1.029 04/30/2020 0448   PHURINE 5.0 04/30/2020 0448   GLUCOSEU NEGATIVE 04/30/2020 0448   HGBUR NEGATIVE 04/30/2020 0448   BILIRUBINUR NEGATIVE 04/30/2020 0448   KETONESUR NEGATIVE 04/30/2020 0448   PROTEINUR  30 (A) 04/30/2020 0448   UROBILINOGEN 1.0 02/09/2011 2210   NITRITE NEGATIVE 04/30/2020 0448   LEUKOCYTESUR MODERATE (A) 04/30/2020 0448    Radiological Exams on Admission: DG Foot Complete Right  Result Date: 06/07/2023 CLINICAL DATA:  Wound and swelling. EXAM: RIGHT FOOT COMPLETE - 3+ VIEW COMPARISON:  None Available. FINDINGS: There is no evidence of fracture or dislocation. No erosive change, periostitis or bone destruction. There is no evidence of arthropathy or other focal bone abnormality. A dressing overlies the midfoot. Diffuse soft tissue edema about the dorsal and lateral midfoot. No soft tissue gas or radiopaque foreign body allowing for overlying dressing. IMPRESSION: Diffuse soft tissue edema about the dorsal and lateral midfoot. No soft tissue gas or radiopaque foreign body. No acute osseous findings. Electronically Signed   By: Narda Rutherford M.D.   On: 06/07/2023 23:50    Assessment and Plan  Right foot infection/cellulitis No fever or significant leukocytosis.  Lactate slightly elevated but not hypotensive.  SBP currently  >100 and not tachycardic. No signs of sepsis at this time. X-ray of right foot showing diffuse soft tissue edema but negative for soft tissue gas or acute osseous findings.  Dorsalis pedis pulse intact and no signs of vascular compromise.  Check A1c, ESR, and CRP.  Continue vancomycin and cefepime.  1 L fluid bolus ordered in the ED, repeat lactate after bolus.  Blood cultures pending.  Continue to monitor WBC count.  Consult orthopedics in the morning.  Polysubstance abuse/IV drug use Patient states he has not used IV heroin for the past 1 year.  COWS monitoring.  DVT prophylaxis: SCDs Code Status: Full Code (discussed with the patient) Admission status: It is my clinical opinion that admission to INPATIENT is reasonable and necessary because of the expectation that this patient will require hospital care that crosses at least 2 midnights to treat this condition based on the medical complexity of the problems presented.  Given the aforementioned information, the predictability of an adverse outcome is felt to be significant.  John Giovanni MD [facility]  If 7PM-7AM, please contact night-coverage [email or web address]  06/08/2023, 4:07 AM

## 2023-06-08 NOTE — Progress Notes (Signed)
pt reporting 8/10 pain upset that he doesn't recognize toradol and has reported an allergy to tramadol but it's still being "pushed" on him I explained to him that NSAID will help with pain from swelling and tramadol is in fact a narcotic he spoke over me saying, "doctor. I asked to speak to the doctor."  no PRNs given   I revisited the pt to again find a suitable method of pain control.  Ice refused Position changed refused  "I could have stayed at home and taken care of this myself"

## 2023-06-08 NOTE — ED Notes (Signed)
Called for transport

## 2023-06-08 NOTE — ED Notes (Signed)
ED TO INPATIENT HANDOFF REPORT  ED Nurse Name and Phone #:  Theophilus Bones [phone]  S Name/Age/Gender Tad Moore 37 y.o. male Room/Bed: 030C/030C  Code Status   Code Status: Full Code  Home/SNF/Other Home Patient oriented to: self, place, time, and situation Is this baseline? Yes   Triage Complete: Triage complete  Chief Complaint Right foot infection [L08.9]  Triage Note Pt reports R foot infection for week. Entire foot is red and swollen and large area on top of foot extending to ankle. Large are of open sore with drainage. Hx of multiple infections to extremities. IV drug user.    Allergies Allergies  Allergen Reactions   Codeine Nausea And Vomiting   Hydrocodone Itching   Tramadol Other (See Comments)    "messes with head"    Level of Care/Admitting Diagnosis ED Disposition     ED Disposition  Admit   Condition  --   Comment  Hospital Area: [facility] [[id number]]  Level of Care: Progressive [102]  Admit to Progressive based on following criteria: MULTISYSTEM THREATS such as stable sepsis, metabolic/electrolyte imbalance with or without encephalopathy that is responding to early treatment.  May admit patient to [facility] or [facility] if equivalent level of care is available:: Yes  Covid Evaluation: Asymptomatic - no recent exposure (last 10 days) testing not required  Diagnosis: Right foot infection [454098]  Admitting Physician: John Giovanni [[id number]]  Attending Physician: John Giovanni [[id number]]  Certification:: I certify this patient will need inpatient services for at least 2 midnights  Expected Medical Readiness: 06/11/2023          B Medical/Surgery History Past Medical History:  Diagnosis Date   Back pain    Cellulitis 10/13/2019   Drug abuse (HCC)    Fungemia 05/02/2019   IV drug abuse (HCC)    IVDU (intravenous drug user)    LFTs abnormal    MRSA bacteremia 04/29/2019   Overdose of heroin, accidental or  unintentional, initial encounter (HCC) 05/31/2019   Suicidal ideation 10/30/2019   Past Surgical History:  Procedure Laterality Date   I & D EXTREMITY Right 04/28/2019   Procedure: IRRIGATION AND DEBRIDEMENT ARM ABSCESS;  Surgeon: Sheral Apley, MD;  Location: [facility] OR;  Service: Orthopedics;  Laterality: Right;   I & D EXTREMITY Right 04/01/2020   Procedure: IRRIGATION AND DEBRIDEMENT OF ELBOW;  Surgeon: Bradly Bienenstock, MD;  Location: [facility] OR;  Service: Orthopedics;  Laterality: Right;   TEE WITHOUT CARDIOVERSION N/A 05/03/2019   Procedure: TRANSESOPHAGEAL ECHOCARDIOGRAM (TEE);  Surgeon: Jake Bathe, MD;  Location: [facility] ENDOSCOPY;  Service: Cardiovascular;  Laterality: N/A;   TEE WITHOUT CARDIOVERSION N/A 04/14/2020   Procedure: TRANSESOPHAGEAL ECHOCARDIOGRAM (TEE);  Surgeon: Jodelle Red, MD;  Location: [facility] ENDOSCOPY;  Service: Cardiovascular;  Laterality: N/A;     A IV Location/Drains/Wounds Patient Lines/Drains/Airways Status     Active Line/Drains/Airways     Name Placement date Placement time Site Days   Peripheral IV 06/08/23 18 G 2.5" Anterior;Left;Upper Arm 06/08/23  0457  Arm  less than 1   Incision (Closed) 04/28/19 Arm Right 04/28/19  2015  -- 1502            Intake/Output Last 24 hours  Intake/Output Summary (Last 24 hours) at 06/08/2023 0713 Last data filed at 06/08/2023 0640 Gross per 24 hour  Intake 1100 ml  Output --  Net 1100 ml    Labs/Imaging Results for orders placed or performed during the hospital encounter of 06/07/23 (from the  past 48 hour(s))  Comprehensive metabolic panel     Status: Abnormal   Collection Time: 06/07/23 10:39 PM  Result Value Ref Range   Sodium 139 135 - 145 mmol/L   Potassium 3.8 3.5 - 5.1 mmol/L   Chloride 100 98 - 111 mmol/L   CO2 27 22 - 32 mmol/L   Glucose, Bld 101 (H) 70 - 99 mg/dL    Comment: Glucose reference range applies only to samples taken after fasting for at least 8 hours.   BUN 11 6 - 20 mg/dL    Creatinine, Ser 1.61 0.61 - 1.24 mg/dL   Calcium 9.1 8.9 - 09.6 mg/dL   Total Protein 8.4 (H) 6.5 - 8.1 g/dL   Albumin 3.2 (L) 3.5 - 5.0 g/dL   AST 39 15 - 41 U/L   ALT 30 0 - 44 U/L   Alkaline Phosphatase 75 38 - 126 U/L   Total Bilirubin 0.9 0.3 - 1.2 mg/dL   GFR, Estimated >04 >54 mL/min    Comment: (NOTE) Calculated using the CKD-EPI Creatinine Equation (2021)    Anion gap 12 5 - 15    Comment: Performed at [facility], [address], [place], [place] [address]  CBC with Differential     Status: Abnormal   Collection Time: 06/07/23 10:39 PM  Result Value Ref Range   WBC 10.8 (H) 4.0 - 10.5 K/uL   RBC 5.30 4.22 - 5.81 MIL/uL   Hemoglobin 14.3 13.0 - 17.0 g/dL   HCT 91.4 78.2 - 95.6 %   MCV 82.8 80.0 - 100.0 fL   MCH 27.0 26.0 - 34.0 pg   MCHC 32.6 30.0 - 36.0 g/dL   RDW 21.3 08.6 - 57.8 %   Platelets 359 150 - 400 K/uL   nRBC 0.0 0.0 - 0.2 %   Neutrophils Relative % 66 %   Neutro Abs 7.1 1.7 - 7.7 K/uL   Lymphocytes Relative 21 %   Lymphs Abs 2.3 0.7 - 4.0 K/uL   Monocytes Relative 9 %   Monocytes Absolute 0.9 0.1 - 1.0 K/uL   Eosinophils Relative 3 %   Eosinophils Absolute 0.3 0.0 - 0.5 K/uL   Basophils Relative 1 %   Basophils Absolute 0.1 0.0 - 0.1 K/uL   Immature Granulocytes 0 %   Abs Immature Granulocytes 0.04 0.00 - 0.07 K/uL    Comment: Performed at [facility], [address], [place], [place] [address]  I-Stat Lactic Acid, ED     Status: Abnormal   Collection Time: 06/07/23 11:21 PM  Result Value Ref Range   Lactic Acid, Venous 2.8 (HH) 0.5 - 1.9 mmol/L   Comment NOTIFIED PHYSICIAN   Hemoglobin A1c     Status: None   Collection Time: 06/08/23  5:06 AM  Result Value Ref Range   Hgb A1c MFr Bld 5.5 4.8 - 5.6 %    Comment: (NOTE) Pre diabetes:          5.7%-6.4%  Diabetes:              >6.4%  Glycemic control for   <7.0% adults with diabetes    Mean Plasma Glucose 111.15 mg/dL    Comment: Performed at [facility], [address], [place], [place] [address]  C-reactive protein     Status: Abnormal   Collection Time: 06/08/23  5:06 AM  Result Value Ref Range   CRP 19.9 (H) <1.0 mg/dL    Comment: Performed at [facility], [address] [place], [place] [address]  I-Stat Lactic Acid, ED     Status: None   Collection Time: 06/08/23  5:45 AM  Result Value Ref Range   Lactic Acid, Venous 1.4 0.5 - 1.9 mmol/L   DG Foot Complete Right  Result Date: 06/07/2023 CLINICAL DATA:  Wound and swelling. EXAM: RIGHT FOOT COMPLETE - 3+ VIEW COMPARISON:  None Available. FINDINGS: There is no evidence of fracture or dislocation. No erosive change, periostitis or bone destruction. There is no evidence of arthropathy or other focal bone abnormality. A dressing overlies the midfoot. Diffuse soft tissue edema about the dorsal and lateral midfoot. No soft tissue gas or radiopaque foreign body allowing for overlying dressing. IMPRESSION: Diffuse soft tissue edema about the dorsal and lateral midfoot. No soft tissue gas or radiopaque foreign body. No acute osseous findings. Electronically Signed   By: Narda Rutherford M.D.   On: 06/07/2023 23:50    Pending Labs Unresulted Labs (From admission, onward)     Start     Ordered   06/08/23 0514  CBC  Once,   R        06/08/23 0514   06/08/23 0514  Sedimentation rate  Once,   R        06/08/23 0514   06/08/23 0514  HIV Antibody (routine testing w rflx)  Once,   R        06/08/23 0514   06/08/23 0514  Prealbumin  Once,   R        06/08/23 0514   06/07/23 2240  Blood culture (routine x 2)  BLOOD CULTURE X 2,   R (with STAT occurrences)     Question:  Patient immune status  Answer:  Normal   06/07/23 2239            Vitals/Pain Today's Vitals   06/08/23 0345 06/08/23 0400 06/08/23 0410 06/08/23 0415  BP: 112/76 94/63  108/66  Pulse: 79 77  84  Resp:  16    Temp:  97.9 F (36.6 C)    SpO2: 97% 100%  100%  Weight:   79.4 kg   Height:   6\' 1"  (1.854 m)     Isolation  Precautions No active isolations  Medications Medications  acetaminophen (TYLENOL) tablet 650 mg (has no administration in time range)    Or  acetaminophen (TYLENOL) suppository 650 mg (has no administration in time range)  vancomycin (VANCOREADY) IVPB 1750 mg/350 mL (1,750 mg Intravenous New Bag/Given 06/08/23 0647)  vancomycin (VANCOREADY) IVPB 1500 mg/300 mL (has no administration in time range)  cefTRIAXone (ROCEPHIN) 2 g in sodium chloride 0.9 % 100 mL IVPB (has no administration in time range)  sodium chloride 0.9 % bolus 1,000 mL (0 mLs Intravenous Stopped 06/08/23 0640)    Mobility walks     Focused Assessments Musculoskeletal   R Recommendations: See Admitting Provider Note  Report given to:   Additional Notes:

## 2023-06-08 NOTE — ED Notes (Signed)
PT requested something for pain and then refused both tramadol and tylenol. Same states "I have never been here and they not give me a narcotic." PT advised that admitting provider would be contacted in regards to same. Admitting provider ordered toradol, PT again upset and angry that he is not being ordered any narcotics.

## 2023-06-08 NOTE — Progress Notes (Signed)
Pharmacy Antibiotic Note  Jon Castro is a 37 y.o. male admitted on 06/07/2023 with right foot infection.  Pharmacy has been consulted for cefepime and vancomycin dosing. PMH includes polysubstance abuse/IVDU, MRSA bacteremia, candidemia, hep C. X-ray of right foot showed diffuse soft tissue edema but negative for soft tissue gas or acute osseous findings.  WBC 10.8, sCr 0.79, lactate 2.8, afebrile, 1 set of blood cultures collected Cefepime and vancomycin x1 in ED  Plan: Cefepime 2g IV every 8 hours Vancomycin 1750mg  IV x1 Vancomycin 1500 IV every 12 hours (AUC 430, Vd 0.72, IBW) Monitor renal function Follow up with signs of clinical improvement, de-escalation, LOT, surgical plans   Weight: 79.4 kg (175 lb)  Temp (24hrs), Avg:97.9 F (36.6 C), Min:97.7 F (36.5 C), Max:98 F (36.7 C)  Recent Labs  Lab 06/07/23 2239 06/07/23 2321  WBC 10.8*  --   CREATININE 0.79  --   LATICACIDVEN  --  2.8*    CrCl cannot be calculated (Unknown ideal weight.).    Allergies  Allergen Reactions   Codeine Nausea And Vomiting   Hydrocodone Itching   Tramadol Other (See Comments)    "messes with head"    Antimicrobials this admission: Cefepime 10/10 >>  Vancomycin 10/10 >>   Microbiology results: 10/10 BCx:   Thank you for allowing pharmacy to be a part of this patient's care.  Arabella Merles, PharmD. Clinical Pharmacist 06/08/2023 5:02 AM

## 2023-06-08 NOTE — Progress Notes (Signed)
  Right foot pain secondary to poorly and right foot cellulitis: Patient is complaining about right foot pain.  Patient is declining tramadol and Toradol. -Ordered one-time dose of fentanyl 25 mg.  Tereasa Coop, MD [facility] 06/08/2023, 7:51 PM

## 2023-06-08 NOTE — ED Notes (Signed)
ED TO INPATIENT HANDOFF REPORT  ED Nurse Name and Phone #: Joneen Boers, Paramedic / [phone]  S Name/Age/Gender Jon Castro 37 y.o. male Room/Bed: 012C/012C  Code Status   Code Status: Full Code  Home/SNF/Other To this writer's best knowledge PT is homeless at this time.  Patient oriented to: self, place, time, and situation Is this baseline? Yes   Triage Complete: Triage complete  Chief Complaint Right foot infection [L08.9]  Triage Note Pt reports R foot infection for week. Entire foot is red and swollen and large area on top of foot extending to ankle. Large are of open sore with drainage. Hx of multiple infections to extremities. IV drug user.    Allergies Allergies  Allergen Reactions   Codeine Nausea And Vomiting   Hydrocodone Itching   Tramadol Other (See Comments)    "messes with head"    Level of Care/Admitting Diagnosis ED Disposition     ED Disposition  Admit   Condition  --   Comment  Hospital Area: [facility] [[id number]]  Level of Care: Progressive [102]  Admit to Progressive based on following criteria: MULTISYSTEM THREATS such as stable sepsis, metabolic/electrolyte imbalance with or without encephalopathy that is responding to early treatment.  May admit patient to [facility] or [facility] if equivalent level of care is available:: Yes  Covid Evaluation: Asymptomatic - no recent exposure (last 10 days) testing not required  Diagnosis: Right foot infection [829562]  Admitting Physician: John Giovanni [[id number]]  Attending Physician: John Giovanni [[id number]]  Certification:: I certify this patient will need inpatient services for at least 2 midnights  Expected Medical Readiness: 06/11/2023          B Medical/Surgery History Past Medical History:  Diagnosis Date   Back pain    Cellulitis 10/13/2019   Drug abuse (HCC)    Fungemia 05/02/2019   IV drug abuse (HCC)    IVDU (intravenous drug user)    LFTs  abnormal    MRSA bacteremia 04/29/2019   Overdose of heroin, accidental or unintentional, initial encounter (HCC) 05/31/2019   Suicidal ideation 10/30/2019   Past Surgical History:  Procedure Laterality Date   I & D EXTREMITY Right 04/28/2019   Procedure: IRRIGATION AND DEBRIDEMENT ARM ABSCESS;  Surgeon: Sheral Apley, MD;  Location: [facility] OR;  Service: Orthopedics;  Laterality: Right;   I & D EXTREMITY Right 04/01/2020   Procedure: IRRIGATION AND DEBRIDEMENT OF ELBOW;  Surgeon: Bradly Bienenstock, MD;  Location: [facility] OR;  Service: Orthopedics;  Laterality: Right;   TEE WITHOUT CARDIOVERSION N/A 05/03/2019   Procedure: TRANSESOPHAGEAL ECHOCARDIOGRAM (TEE);  Surgeon: Jake Bathe, MD;  Location: [facility] ENDOSCOPY;  Service: Cardiovascular;  Laterality: N/A;   TEE WITHOUT CARDIOVERSION N/A 04/14/2020   Procedure: TRANSESOPHAGEAL ECHOCARDIOGRAM (TEE);  Surgeon: Jodelle Red, MD;  Location: [facility] ENDOSCOPY;  Service: Cardiovascular;  Laterality: N/A;     A IV Location/Drains/Wounds Patient Lines/Drains/Airways Status     Active Line/Drains/Airways     Name Placement date Placement time Site Days   Peripheral IV 06/08/23 18 G 2.5" Anterior;Left;Upper Arm 06/08/23  0457  Arm  less than 1   Incision (Closed) 04/28/19 Arm Right 04/28/19  2015  -- 1502            Intake/Output Last 24 hours  Intake/Output Summary (Last 24 hours) at 06/08/2023 1501 Last data filed at 06/08/2023 1430 Gross per 24 hour  Intake 1550.31 ml  Output --  Net 1550.31 ml    Labs/Imaging Results  for orders placed or performed during the hospital encounter of 06/07/23 (from the past 48 hour(s))  Comprehensive metabolic panel     Status: Abnormal   Collection Time: 06/07/23 10:39 PM  Result Value Ref Range   Sodium 139 135 - 145 mmol/L   Potassium 3.8 3.5 - 5.1 mmol/L   Chloride 100 98 - 111 mmol/L   CO2 27 22 - 32 mmol/L   Glucose, Bld 101 (H) 70 - 99 mg/dL    Comment: Glucose reference range applies only to  samples taken after fasting for at least 8 hours.   BUN 11 6 - 20 mg/dL   Creatinine, Ser 1.61 0.61 - 1.24 mg/dL   Calcium 9.1 8.9 - 09.6 mg/dL   Total Protein 8.4 (H) 6.5 - 8.1 g/dL   Albumin 3.2 (L) 3.5 - 5.0 g/dL   AST 39 15 - 41 U/L   ALT 30 0 - 44 U/L   Alkaline Phosphatase 75 38 - 126 U/L   Total Bilirubin 0.9 0.3 - 1.2 mg/dL   GFR, Estimated >04 >54 mL/min    Comment: (NOTE) Calculated using the CKD-EPI Creatinine Equation (2021)    Anion gap 12 5 - 15    Comment: Performed at [facility], [address] [place], [place] [address]  CBC with Differential     Status: Abnormal   Collection Time: 06/07/23 10:39 PM  Result Value Ref Range   WBC 10.8 (H) 4.0 - 10.5 K/uL   RBC 5.30 4.22 - 5.81 MIL/uL   Hemoglobin 14.3 13.0 - 17.0 g/dL   HCT 91.4 78.2 - 95.6 %   MCV 82.8 80.0 - 100.0 fL   MCH 27.0 26.0 - 34.0 pg   MCHC 32.6 30.0 - 36.0 g/dL   RDW 21.3 08.6 - 57.8 %   Platelets 359 150 - 400 K/uL   nRBC 0.0 0.0 - 0.2 %   Neutrophils Relative % 66 %   Neutro Abs 7.1 1.7 - 7.7 K/uL   Lymphocytes Relative 21 %   Lymphs Abs 2.3 0.7 - 4.0 K/uL   Monocytes Relative 9 %   Monocytes Absolute 0.9 0.1 - 1.0 K/uL   Eosinophils Relative 3 %   Eosinophils Absolute 0.3 0.0 - 0.5 K/uL   Basophils Relative 1 %   Basophils Absolute 0.1 0.0 - 0.1 K/uL   Immature Granulocytes 0 %   Abs Immature Granulocytes 0.04 0.00 - 0.07 K/uL    Comment: Performed at [facility], [address], [place], [place] [address]  Blood culture (routine x 2)     Status: None (Preliminary result)   Collection Time: 06/07/23 11:14 PM   Specimen: BLOOD LEFT HAND  Result Value Ref Range   Specimen Description BLOOD LEFT HAND    Special Requests      BOTTLES DRAWN AEROBIC AND ANAEROBIC Blood Culture adequate volume   Culture      NO GROWTH < 24 HOURS Performed at [facility], [address], [place], [place] [address]    Report Status PENDING   I-Stat Lactic Acid, ED     Status: Abnormal    Collection Time: 06/07/23 11:21 PM  Result Value Ref Range   Lactic Acid, Venous 2.8 (HH) 0.5 - 1.9 mmol/L   Comment NOTIFIED PHYSICIAN   Hemoglobin A1c     Status: None   Collection Time: 06/08/23  5:06 AM  Result Value Ref Range   Hgb A1c MFr Bld 5.5 4.8 - 5.6 %  Comment: (NOTE) Pre diabetes:          5.7%-6.4%  Diabetes:              >6.4%  Glycemic control for   <7.0% adults with diabetes    Mean Plasma Glucose 111.15 mg/dL    Comment: Performed at [facility], [address] [place], [place] [address]  C-reactive protein     Status: Abnormal   Collection Time: 06/08/23  5:06 AM  Result Value Ref Range   CRP 19.9 (H) <1.0 mg/dL    Comment: Performed at [facility], [address] [place], [place] [address]  Blood culture (routine x 2)     Status: None (Preliminary result)   Collection Time: 06/08/23  5:14 AM   Specimen: BLOOD LEFT ARM  Result Value Ref Range   Specimen Description BLOOD LEFT ARM    Special Requests AEROBIC BOTTLE ONLY Blood Culture adequate volume    Culture      NO GROWTH < 12 HOURS Performed at [facility], [address], [place], [place] [address]    Report Status PENDING   I-Stat Lactic Acid, ED     Status: None   Collection Time: 06/08/23  5:45 AM  Result Value Ref Range   Lactic Acid, Venous 1.4 0.5 - 1.9 mmol/L   MR ANKLE RIGHT WO CONTRAST  Result Date: 06/08/2023 CLINICAL DATA:  37 year old male with history of polysubstance abuse, IV drug use, MRSA bacteremia, candidemia, hepatitis-C with right foot infection. The patient reports this initially started as "a small bump" on his right foot which has now gotten progressively more swollen and red. Now with open wound and pus drainage. EXAM: MRI OF THE RIGHT ANKLE WITHOUT CONTRAST TECHNIQUE: Multiplanar, multisequence MR imaging of the ankle was performed. No intravenous contrast was administered. COMPARISON:  Right foot radiographs 06/07/2023 and 03/06/2012 FINDINGS:  TENDONS Peroneal: Mild peroneus longus and brevis tenosynovitis at the level of the distal fibula and also within the distal tendon sheaths. The tendons otherwise intact. Posteromedial: Mild posterior tibial, flexor digitorum longus, and flexor hallucis longus tenosynovitis. Anterior: There is edema within the visualized portion of the distal extensor digitorum longus muscle (sagittal series 3, image 10 and axial series 5, image 1) that is greatest within the anterolateral aspect of the muscle, surrounding the fifth digit extensor tendon that also demonstrates a chevron configuration with an apparent decreased T1 increased T2 signal near fluid bright oval cyst within the anterior aspect of the partially split tendon suggesting a partial-thickness tear along an approximate 2.0 cm length (sagittal series image 9), with a anterior transverse dimension of the tear measuring up to 6 mm in transverse dimension (axial images 1 through 6). The more distal 5th extensor digitorum longus tendon appears intact. The extensor hallucis longus and extensor digitorum longus tendons are intact. Achilles: Intact. Plantar Fascia: Intact. LIGAMENTS Lateral: The anterior and posterior talofibular, anterior and posterior tibiofibular, and calcaneofibular ligaments are intact. Medial: The tibiotalar deep deltoid and tibial spring ligaments are intact. CARTILAGE Ankle Joint: Mild lateral tibiotalar cartilage thinning. Subtalar Joints/Sinus Tarsi: Mild-to-moderate posterior subtalar and middle subtalar cartilage thinning and peripheral osteophytosis. Mild anterior middle subtalar joint subchondral marrow edema.Fat is preserved within the sinus tarsi. Bones: Mild dorsal talonavicular cartilage thinning, joint space narrowing, and peripheral osteophytosis. There is moderate marrow edema within the plantar aspect of the cuboid, with a somewhat curvilinear component (sagittal images 11 through 13, coronal images 31 through 33) that could  represent a borderline fracture or highly healed fracture. No definite cortical erosion to suggest acute osteomyelitis. Mild-to-moderate tarsometatarsal cartilage thinning. Other: The tarsal tunnel is unremarkable. The Lisfranc ligament complex is intact. There is decreased T1 and increased T2 signal fluid with intermediate T2 signal complexity within the dorsal lateral ankle and midfoot, superficial to the extensor digitorum longus tendons and appearing to drain through an ulcer within the skin surface (axial series image 18), overall measuring up to approximately 4.6 cm in transverse dimension dorsal to the extensor hallucis and extensor digitorum longus the tendons (coronal series 7, image 36), 1.5 cm in transverse dimension at the more inferior aspect of this collection lateral to the talar neck (axial image 29), 3.9 cm in craniocaudal dimension (coronal image 29), and 6.3 cm along the longitudinal dimension of the foot (axial series 5, image 20). This has walled-off borders and is suspicious for an abscess. There is mild to moderate edema within the underlying extensor digitorum brevis musculature. No definite cortical erosion is seen. IMPRESSION: 1. There is a 4.6 x 3.9 x 6.3 cm complex fluid collection suspicious for an abscess within the dorsal lateral ankle and midfoot, superficial to the extensor digitorum longus tendons and appearing to drain through an ulcer within the skin surface. This is suspicious for an abscess. 2. There is edema within the visualized portion of the distal extensor digitorum longus muscle that is greatest within the anterolateral aspect of the muscle, surrounding the fifth digit extensor tendon that also demonstrates a chevron configuration and partial-thickness longitudinal tear along an approximate 2.0 cm length. 3. There is moderate marrow edema within the plantar aspect of the cuboid, with a somewhat curvilinear component that could represent a borderline fracture or highly  healed fracture. No definite cortical erosion to suggest acute osteomyelitis. 4. 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 5. Mild peroneus longus and brevis tenosynovitis at the level of the distal fibula and also within the distal tendon sheaths. 6. Mild posterior tibial, flexor digitorum longus, and flexor hallucis longus tenosynovitis. Electronically Signed   By: Neita Garnet M.D.   On: 06/08/2023 12:03   DG Foot Complete Right  Result Date: 06/07/2023 CLINICAL DATA:  Wound and swelling. EXAM: RIGHT FOOT COMPLETE - 3+ VIEW COMPARISON:  None Available. FINDINGS: There is no evidence of fracture or dislocation. No erosive change, periostitis or bone destruction. There is no evidence of arthropathy or other focal bone abnormality. A dressing overlies the midfoot. Diffuse soft tissue edema about the dorsal and lateral midfoot. No soft tissue gas or radiopaque foreign body allowing for overlying dressing. IMPRESSION: Diffuse soft tissue edema about the dorsal and lateral midfoot. No soft tissue gas or radiopaque foreign body. No acute osseous findings. Electronically Signed   By: Narda Rutherford M.D.   On: 06/07/2023 23:50    Pending Labs Unresulted Labs (From admission, onward)     Start     Ordered   06/08/23 0514  CBC  Once,   R        06/08/23 0514   06/08/23 0514  Sedimentation rate  Once,   R        06/08/23 0514   06/08/23 0514  HIV Antibody (routine testing w rflx)  Once,   R        06/08/23 0514   06/08/23 0514  Prealbumin  Once,   R        06/08/23 0514            Vitals/Pain Today's Vitals  06/08/23 0915 06/08/23 0930 06/08/23 1247 06/08/23 1355  BP: (!) 96/59 (!) 94/59 (!) 91/57 (!) 111/54  Pulse: (!) 52 (!) 46 65 (!) 57  Resp:   17   Temp:   98 F (36.7 C)   SpO2: 100% 100% 100% 100%  Weight:      Height:        Isolation Precautions No active isolations  Medications Medications  acetaminophen (TYLENOL) tablet 650 mg (has no  administration in time range)    Or  acetaminophen (TYLENOL) suppository 650 mg (has no administration in time range)  vancomycin (VANCOREADY) IVPB 1500 mg/300 mL (has no administration in time range)  cefTRIAXone (ROCEPHIN) 2 g in sodium chloride 0.9 % 100 mL IVPB (0 g Intravenous Stopped 06/08/23 1430)  traMADol (ULTRAM) tablet 50 mg (50 mg Oral Patient Refused/Not Given 06/08/23 1355)  pantoprazole (PROTONIX) EC tablet 40 mg (40 mg Oral Given 06/08/23 1457)  ketorolac (TORADOL) 15 MG/ML injection 15 mg (15 mg Intravenous Patient Refused/Not Given 06/08/23 1457)  sodium chloride 0.9 % bolus 1,000 mL (0 mLs Intravenous Stopped 06/08/23 0640)  vancomycin (VANCOREADY) IVPB 1750 mg/350 mL (0 mg Intravenous Stopped 06/08/23 0935)    Mobility PT is normally ambulatory, due to infected abscess on PT's foot, assistance with ambulation may be needed.      Focused Assessments     R Recommendations: See Admitting Provider Note  Report given to:   Additional Notes:  Pt is appears to be upset that he is not receiving any narcotics IV. This Clinical research associate has spoken with admitting provider who has stated he will not be ordering IV narcotics. PT is demanding to speak with admitting provider and when this writer informed admitting of same, he stated "we will talk tomorrow." PT is also threatening to leave if he does not receive IV narcotics, this same information was relayed to admitting as well, who stated "that is okay, he has capacity."

## 2023-06-08 NOTE — Progress Notes (Signed)
ED Pharmacy Antibiotic Sign Off An antibiotic consult was received from an ED provider for cefepime and vancomycin per pharmacy dosing for foot infection. A chart review was completed to assess appropriateness.   The following one time order(s) were placed:   -Cefepime 2g IV x1 -Vancomycin 1750 mg IV x1  Further antibiotic and/or antibiotic pharmacy consults should be ordered by the admitting provider if indicated.   Thank you for allowing pharmacy to be a part of this patient's care.   Arabella Merles, [facility]  Clinical Pharmacist 06/08/23 4:01 AM

## 2023-06-09 ENCOUNTER — Inpatient Hospital Stay (HOSPITAL_COMMUNITY): Payer: 59

## 2023-06-09 DIAGNOSIS — L03115 Cellulitis of right lower limb: Secondary | ICD-10-CM | POA: Diagnosis not present

## 2023-06-09 DIAGNOSIS — S91301A Unspecified open wound, right foot, initial encounter: Secondary | ICD-10-CM | POA: Diagnosis not present

## 2023-06-09 DIAGNOSIS — L089 Local infection of the skin and subcutaneous tissue, unspecified: Secondary | ICD-10-CM | POA: Diagnosis not present

## 2023-06-09 MED ORDER — ADULT MULTIVITAMIN W/MINERALS CH: 1.0000 | ORAL_TABLET | Freq: Every day | ORAL | Status: DC

## 2023-06-09 MED ORDER — ENSURE ENLIVE PO LIQD
237.0000 mL | Freq: Three times a day (TID) | ORAL | Status: DC
  Administered 2023-06-09 – 2023-06-10 (×2): 237 mL via ORAL

## 2023-06-09 MED ORDER — ENOXAPARIN SODIUM 40 MG/0.4ML IJ SOSY: 40.0000 mg | PREFILLED_SYRINGE | INTRAMUSCULAR | Status: DC

## 2023-06-09 NOTE — Progress Notes (Signed)
[facility] PROGRESS NOTE    Progress Note  Jon Castro  ZOX:[id number] DOB: December 21, 1985 DOA: 06/07/2023 PCP: Patient, [name] Per     Brief Narrative:   Jon Castro is an 37 y.o. male past medical history significant for polysubstance abuse/IVDA, MRSA bacteremia and candidemia hepatitis C major depressive disorder with psychosis comes in complaining of right foot cellulitis, with a small open wound wound with pus drainage.  Mild leukocytosis and lactic acidosis on admission blood cultures were ordered x-ray of the foot showed diffuse soft tissue edema no gas or osseous findings.  For resuscitated started on antibiotics.  Assessment/Plan:   Purulent Cellulitis of right foot: No signs of sepsis.  Showed soft tissue edema no foreign body or osteomyelitis.  Wound is more than 6 cm and has been there in 2 weeks. A1c 5.5, ESR 56. Continue IV Vanco and cefepime. Afebrile, blood cultures negative till date. MRI showed a complex fluid abscess that is draining no acute osteomyelitis. Will consult Ortho for possible I&D. Relates his pain is not controlled he relates he is not to use anything does not on narcotic refused Toradol Tylenol and tramadol, he says he has an allergy to tramadol he relates his messes with his head.  He is not even willing to try Toradol.  IVDA: Relates he has not been using heroin for the last year.   DVT prophylaxis: lovenox Family Communication:none Status is: Inpatient Remains inpatient appropriate because: Right foot cellulitis    Code Status:     Code Status Orders  (From admission, onward)           Start     Ordered   06/08/23 0446  Full code  Continuous       Question:  By:  Answer:  Consent: discussion documented in EHR   06/08/23 0447           Code Status History     Date Active Date Inactive Code Status Order ID Comments User Context   04/30/2020 0950 05/05/2020 1903 Full Code [id number]  Claudean Severance, MD ED    04/01/2020 1324 04/22/2020 2130 Full Code [id number]  Yvette Rack, MD ED   01/20/2020 0721 01/20/2020 1823 Full Code [id number]  Joy, Hillard Danker, PA-C ED   01/02/2020 0314 01/03/2020 1728 Full Code [id number]  Garlon Hatchet, PA-C ED   10/30/2019 1252 11/03/2019 1646 Full Code [id number]  Aldean Baker, NP Inpatient   10/30/2019 0226 10/30/2019 1156 Full Code [id number]  Zadie Rhine, MD ED   10/13/2019 0241 10/15/2019 1727 Full Code [id number]  Pearson Grippe, MD ED   05/30/2019 1418 05/31/2019 1912 Full Code [id number]  Cristina Gong, PA-C ED   04/28/2019 0334 05/19/2019 1839 Full Code [id number]  Rometta Emery, MD Inpatient   04/22/2019 0253 04/23/2019 1454 Full Code [id number]  Marily Memos, MD ED   02/21/2019 2325 02/22/2019 1828 Full Code [id number]  Garlon Hatchet, PA-C ED   02/07/2019 2000 02/11/2019 1553 Full Code [id number]  Money, Gerlene Burdock, FNP Inpatient         IV Access:   Peripheral IV   Procedures and diagnostic studies:   MR ANKLE RIGHT WO CONTRAST  Result Date: 06/08/2023 CLINICAL DATA:  37 year old male with history of polysubstance abuse, IV drug use, MRSA bacteremia, candidemia, hepatitis-C with right foot infection. The patient reports this initially started as "a small bump" on his right foot which has now gotten progressively more swollen and red. Now with open wound  and pus drainage. EXAM: MRI OF THE RIGHT ANKLE WITHOUT CONTRAST TECHNIQUE: Multiplanar, multisequence MR imaging of the ankle was performed. No intravenous contrast was administered. COMPARISON:  Right foot radiographs 06/07/2023 and 03/06/2012 FINDINGS: TENDONS Peroneal: Mild peroneus longus and brevis tenosynovitis at the level of the distal fibula and also within the distal tendon sheaths. The tendons otherwise intact. Posteromedial: Mild posterior tibial, flexor digitorum longus, and flexor hallucis longus tenosynovitis. Anterior: There is edema within the visualized portion of the distal extensor digitorum longus muscle  (sagittal series 3, image 10 and axial series 5, image 1) that is greatest within the anterolateral aspect of the muscle, surrounding the fifth digit extensor tendon that also demonstrates a chevron configuration with an apparent decreased T1 increased T2 signal near fluid bright oval cyst within the anterior aspect of the partially split tendon suggesting a partial-thickness tear along an approximate 2.0 cm length (sagittal series image 9), with a anterior transverse dimension of the tear measuring up to 6 mm in transverse dimension (axial images 1 through 6). The more distal 5th extensor digitorum longus tendon appears intact. The extensor hallucis longus and extensor digitorum longus tendons are intact. Achilles: Intact. Plantar Fascia: Intact. LIGAMENTS Lateral: The anterior and posterior talofibular, anterior and posterior tibiofibular, and calcaneofibular ligaments are intact. Medial: The tibiotalar deep deltoid and tibial spring ligaments are intact. CARTILAGE Ankle Joint: Mild lateral tibiotalar cartilage thinning. Subtalar Joints/Sinus Tarsi: Mild-to-moderate posterior subtalar and middle subtalar cartilage thinning and peripheral osteophytosis. Mild anterior middle subtalar joint subchondral marrow edema.Fat is preserved within the sinus tarsi. Bones: Mild dorsal talonavicular cartilage thinning, joint space narrowing, and peripheral osteophytosis. There is moderate marrow edema within the plantar aspect of the cuboid, with a somewhat curvilinear component (sagittal images 11 through 13, coronal images 31 through 33) that could represent a borderline fracture or highly healed fracture. No definite cortical erosion to suggest acute osteomyelitis. Mild-to-moderate tarsometatarsal cartilage thinning. Other: The tarsal tunnel is unremarkable. The Lisfranc ligament complex is intact. There is decreased T1 and increased T2 signal fluid with intermediate T2 signal complexity within the dorsal lateral ankle and  midfoot, superficial to the extensor digitorum longus tendons and appearing to drain through an ulcer within the skin surface (axial series image 18), overall measuring up to approximately 4.6 cm in transverse dimension dorsal to the extensor hallucis and extensor digitorum longus the tendons (coronal series 7, image 36), 1.5 cm in transverse dimension at the more inferior aspect of this collection lateral to the talar neck (axial image 29), 3.9 cm in craniocaudal dimension (coronal image 29), and 6.3 cm along the longitudinal dimension of the foot (axial series 5, image 20). This has walled-off borders and is suspicious for an abscess. There is mild to moderate edema within the underlying extensor digitorum brevis musculature. No definite cortical erosion is seen. IMPRESSION: 1. There is a 4.6 x 3.9 x 6.3 cm complex fluid collection suspicious for an abscess within the dorsal lateral ankle and midfoot, superficial to the extensor digitorum longus tendons and appearing to drain through an ulcer within the skin surface. This is suspicious for an abscess. 2. There is edema within the visualized portion of the distal extensor digitorum longus muscle that is greatest within the anterolateral aspect of the muscle, surrounding the fifth digit extensor tendon that also demonstrates a chevron configuration and partial-thickness longitudinal tear along an approximate 2.0 cm length. 3. There is moderate marrow edema within the plantar aspect of the cuboid, with a somewhat curvilinear component that could  represent a borderline fracture or highly healed fracture. No definite cortical erosion to suggest acute osteomyelitis. 4. 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 5. Mild peroneus longus and brevis tenosynovitis at the level of the distal fibula and also within the distal tendon sheaths. 6. Mild posterior tibial, flexor digitorum longus, and flexor hallucis longus tenosynovitis. Electronically  Signed   By: Neita Garnet M.D.   On: 06/08/2023 12:03   DG Foot Complete Right  Result Date: 06/07/2023 CLINICAL DATA:  Wound and swelling. EXAM: RIGHT FOOT COMPLETE - 3+ VIEW COMPARISON:  None Available. FINDINGS: There is no evidence of fracture or dislocation. No erosive change, periostitis or bone destruction. There is no evidence of arthropathy or other focal bone abnormality. A dressing overlies the midfoot. Diffuse soft tissue edema about the dorsal and lateral midfoot. No soft tissue gas or radiopaque foreign body allowing for overlying dressing. IMPRESSION: Diffuse soft tissue edema about the dorsal and lateral midfoot. No soft tissue gas or radiopaque foreign body. No acute osseous findings. Electronically Signed   By: Narda Rutherford M.D.   On: 06/07/2023 23:50     Medical Consultants:   None.   Subjective:    Francoise Schaumann Broner's pain is not controlled, does not want to use anything does not a narcotic.  Objective:    Vitals:   06/08/23 2005 06/08/23 2326 06/09/23 0330 06/09/23 0712  BP: (!) 97/54 109/64 96/64 121/75  Pulse: 67 72 (!) 56 60  Resp: 17  17 18   Temp: 97.9 F (36.6 C) 98.6 F (37 C) 98.1 F (36.7 C)   TempSrc: Oral Oral Oral   SpO2: 99% 99% 96% 98%  Weight:      Height:       SpO2: 98 %   Intake/Output Summary (Last 24 hours) at 06/09/2023 0759 Last data filed at 06/09/2023 0600 Gross per 24 hour  Intake 1374.08 ml  Output --  Net 1374.08 ml   Filed Weights   06/08/23 0410  Weight: 79.4 kg    Exam: General exam: In no acute distress. Respiratory system: Good air movement and clear to auscultation. Cardiovascular system: S1 & S2 heard, RRR. No JVD. Gastrointestinal system: Abdomen is nondistended, soft and nontender.  Extremities: Right lower extremity swelling with purulent drainage. Skin: No rashes, lesions or ulcers Psychiatry: Judgement and insight appear normal. Mood & affect appropriate.  Data Reviewed:    Labs: Basic  Metabolic Panel: Recent Labs  Lab 06/07/23 2239  NA 139  K 3.8  CL 100  CO2 27  GLUCOSE 101*  BUN 11  CREATININE 0.79  CALCIUM 9.1   GFR Estimated Creatinine Clearance: 140.6 mL/min (by C-G formula based on SCr of 0.79 mg/dL). Liver Function Tests: Recent Labs  Lab 06/07/23 2239  AST 39  ALT 30  ALKPHOS 75  BILITOT 0.9  PROT 8.4*  ALBUMIN 3.2*   No results for input(s): "LIPASE", "AMYLASE" in the last 168 hours. No results for input(s): "AMMONIA" in the last 168 hours. Coagulation profile No results for input(s): "INR", "PROTIME" in the last 168 hours. COVID-19 Labs  Recent Labs    06/08/23 0506  CRP 19.9*    Lab Results  Component Value Date   SARSCOV2NAA POSITIVE (A) 04/07/2020   SARSCOV2NAA POSITIVE (A) 04/01/2020   SARSCOV2NAA NEGATIVE 01/20/2020   SARSCOV2NAA NEGATIVE 01/02/2020    CBC: Recent Labs  Lab 06/07/23 2239 06/08/23 1826  WBC 10.8* 6.9  NEUTROABS 7.1  --   HGB 14.3 11.8*  HCT 43.9  36.1*  MCV 82.8 84.3  PLT 359 291   Cardiac Enzymes: No results for input(s): "CKTOTAL", "CKMB", "CKMBINDEX", "TROPONINI" in the last 168 hours. BNP (last 3 results) No results for input(s): "PROBNP" in the last 8760 hours. CBG: No results for input(s): "GLUCAP" in the last 168 hours. D-Dimer: No results for input(s): "DDIMER" in the last 72 hours. Hgb A1c: Recent Labs    06/08/23 0506  HGBA1C 5.5   Lipid Profile: No results for input(s): "CHOL", "HDL", "LDLCALC", "TRIG", "CHOLHDL", "LDLDIRECT" in the last 72 hours. Thyroid function studies: No results for input(s): "TSH", "T4TOTAL", "T3FREE", "THYROIDAB" in the last 72 hours.  Invalid input(s): "FREET3" Anemia work up: No results for input(s): "VITAMINB12", "FOLATE", "FERRITIN", "TIBC", "IRON", "RETICCTPCT" in the last 72 hours. Sepsis Labs: Recent Labs  Lab 06/07/23 2239 06/07/23 2321 06/08/23 0545 06/08/23 1826  WBC 10.8*  --   --  6.9  LATICACIDVEN  --  2.8* 1.4  --     Microbiology Recent Results (from the past 240 hour(s))  Blood culture (routine x 2)     Status: None (Preliminary result)   Collection Time: 06/07/23 11:14 PM   Specimen: BLOOD LEFT HAND  Result Value Ref Range Status   Specimen Description BLOOD LEFT HAND  Final   Special Requests   Final    BOTTLES DRAWN AEROBIC AND ANAEROBIC Blood Culture adequate volume   Culture   Final    NO GROWTH < 24 HOURS Performed at [facility], [address], [place], [place] [address]    Report Status PENDING  Incomplete  Blood culture (routine x 2)     Status: None (Preliminary result)   Collection Time: 06/08/23  5:14 AM   Specimen: BLOOD LEFT ARM  Result Value Ref Range Status   Specimen Description BLOOD LEFT ARM  Final   Special Requests AEROBIC BOTTLE ONLY Blood Culture adequate volume  Final   Culture   Final    NO GROWTH < 12 HOURS Performed at [facility], [address] [place], [place] [address]    Report Status PENDING  Incomplete     Medications:    pantoprazole  40 mg Oral BID   Continuous Infusions:  cefTRIAXone (ROCEPHIN)  IV Stopped (06/08/23 1430)   vancomycin 1,500 mg (06/09/23 0500)      LOS: 1 day   Marinda Elk  [facility]  06/09/2023, 7:59 AM

## 2023-06-09 NOTE — Plan of Care (Signed)
  Problem: Education: Goal: Knowledge of General Education information will improve Description: Including pain rating scale, medication(s)/side effects and non-pharmacologic comfort measures Outcome: Progressing   Problem: Clinical Measurements: Goal: Ability to maintain clinical measurements within normal limits will improve Outcome: Progressing Goal: Diagnostic test results will improve Outcome: Progressing   

## 2023-06-09 NOTE — Progress Notes (Signed)
Initial Nutrition Assessment  DOCUMENTATION CODES:   Not applicable  INTERVENTION:   - Ensure Enlive po TID, each supplement provides 350 kcal and 20 grams of protein  - MVI with minerals daily  NUTRITION DIAGNOSIS:   Inadequate oral intake related to poor appetite as evidenced by per patient/family report.  GOAL:   Patient will meet greater than or equal to 90% of their needs  MONITOR:   PO intake, Supplement acceptance, Weight trends, Skin  REASON FOR ASSESSMENT:   Consult Wound healing  ASSESSMENT:   37 year old male who presented to the ED on 10/09 with R foot infection. PMH of polysubstance abuse/IVDU, MRSA bacteremia, candidemia, hepatitis C, MDD with psychosis. Pt admitted with R foot infection/cellulitis.  Spoke with pt at bedside. Pt reports that he did not eat breakfast or lunch today. He reports that he will drink Ensure supplements if provided.  Pt refused NFPE due to pain. He states that he will eat more once his pain is addressed. He reports typically eating 3 meals daily but refused to provide additional information regarding diet and weight history.  Will order Ensure Enlive oral nutrition supplements as well as MVI with minerals daily. Suspect pt with malnutrition but unable to confirm without NFPE.  Medications reviewed and include: protonix, IV abx  Labs reviewed: CRP 19.9  NUTRITION - FOCUSED PHYSICAL EXAM:  Unable to complete. Pt refused.  Diet Order:   Diet Order             Diet regular Room service appropriate? Yes; Fluid consistency: Thin  Diet effective now                   EDUCATION NEEDS:   Not appropriate for education at this time  Skin:  Skin Assessment: Skin Integrity Issues: Other: wound to R foot  Last BM:  no documented BM  Height:   Ht Readings from Last 1 Encounters:  06/08/23 6\' 1"  (1.854 m)    Weight:   Wt Readings from Last 1 Encounters:  06/08/23 79.4 kg    BMI:  Body mass index is 23.09  kg/m.  Estimated Nutritional Needs:   Kcal:  2200-2400  Protein:  105-120 grams  Fluid:  >2.0 L    Mertie Clause, MS, RD, LDN Registered Dietitian II Please see AMiON for contact information.

## 2023-06-10 DIAGNOSIS — L089 Local infection of the skin and subcutaneous tissue, unspecified: Secondary | ICD-10-CM | POA: Diagnosis not present

## 2023-06-10 DIAGNOSIS — S91301A Unspecified open wound, right foot, initial encounter: Principal | ICD-10-CM

## 2023-06-10 DIAGNOSIS — L03115 Cellulitis of right lower limb: Secondary | ICD-10-CM | POA: Diagnosis not present

## 2023-06-10 NOTE — Consult Note (Signed)
ORTHOPAEDIC CONSULTATION  REQUESTING PHYSICIAN: Marinda Elk, MD  Chief Complaint: Abscess and ulceration dorsal lateral aspect of the right foot.  HPI: Jon Castro is a 37 y.o. male who presents with several week history of abscess and ulceration dorsal lateral aspect of the right foot.  Patient is unsure the mechanism of action.  Patient has undergone previous irrigation and debridements of infections in the upper extremities in 2000 and 2001.  Past Medical History:  Diagnosis Date   Back pain    Cellulitis 10/13/2019   Drug abuse (HCC)    Fungemia 05/02/2019   IV drug abuse (HCC)    IVDU (intravenous drug user)    LFTs abnormal    MRSA bacteremia 04/29/2019   Overdose of heroin, accidental or unintentional, initial encounter (HCC) 05/31/2019   Suicidal ideation 10/30/2019   Past Surgical History:  Procedure Laterality Date   I & D EXTREMITY Right 04/28/2019   Procedure: IRRIGATION AND DEBRIDEMENT ARM ABSCESS;  Surgeon: Sheral Apley, MD;  Location: [facility] OR;  Service: Orthopedics;  Laterality: Right;   I & D EXTREMITY Right 04/01/2020   Procedure: IRRIGATION AND DEBRIDEMENT OF ELBOW;  Surgeon: Bradly Bienenstock, MD;  Location: [facility] OR;  Service: Orthopedics;  Laterality: Right;   TEE WITHOUT CARDIOVERSION N/A 05/03/2019   Procedure: TRANSESOPHAGEAL ECHOCARDIOGRAM (TEE);  Surgeon: Jake Bathe, MD;  Location: [facility] ENDOSCOPY;  Service: Cardiovascular;  Laterality: N/A;   TEE WITHOUT CARDIOVERSION N/A 04/14/2020   Procedure: TRANSESOPHAGEAL ECHOCARDIOGRAM (TEE);  Surgeon: Jodelle Red, MD;  Location: [facility] ENDOSCOPY;  Service: Cardiovascular;  Laterality: N/A;   Social History   Socioeconomic History   Marital status: Single    Spouse name: Not on file   Number of children: Not on file   Years of education: Not on file   Highest education level: Not on file  Occupational History   Not on file  Tobacco Use   Smoking status: Every Day    Current packs/day: 0.50     Types: Cigarettes   Smokeless tobacco: Never  Vaping Use   Vaping status: Never Used  Substance and Sexual Activity   Alcohol use: Yes   Drug use: Yes    Types: IV, Cocaine, Amphetamines, Marijuana, Heroin, Methamphetamines    Comment: HEROIN   Sexual activity: Not on file  Other Topics Concern   Not on file  Social History Narrative   ** Merged History Encounter **       Social Determinants of Health   Financial Resource Strain: Not on file  Food Insecurity: No Food Insecurity (06/08/2023)   Hunger Vital Sign    Worried About Running Out of Food in the Last Year: Never true    Ran Out of Food in the Last Year: Never true  Transportation Needs: No Transportation Needs (06/08/2023)   PRAPARE - Administrator, Civil Service (Medical): No    Lack of Transportation (Non-Medical): No  Physical Activity: Not on file  Stress: Not on file  Social Connections: Not on file   History reviewed. No pertinent family history. - negative except otherwise stated in the family history section Allergies  Allergen Reactions   Codeine Nausea And Vomiting   Hydrocodone Itching   Tramadol Other (See Comments)    "messes with head"   Prior to Admission medications   Medication Sig Start Date End Date Taking? Authorizing Provider  cloNIDine (CATAPRES) 0.1 MG tablet Take 1 tablet (0.1 mg total) by mouth 3 (three) times daily for  5 days. Patient not taking: Reported on 03/24/2019 03/08/19 03/24/19  Virgina Norfolk, DO  traZODone (DESYREL) 50 MG tablet Take 1 tablet (50 mg total) by mouth at bedtime as needed for sleep. Patient not taking: Reported on 02/21/2019 02/11/19 03/24/19  Aldean Baker, NP   MR ANKLE RIGHT WO CONTRAST  Result Date: 06/08/2023 CLINICAL DATA:  37 year old male with history of polysubstance abuse, IV drug use, MRSA bacteremia, candidemia, hepatitis-C with right foot infection. The patient reports this initially started as "a small bump" on his right foot which has  now gotten progressively more swollen and red. Now with open wound and pus drainage. EXAM: MRI OF THE RIGHT ANKLE WITHOUT CONTRAST TECHNIQUE: Multiplanar, multisequence MR imaging of the ankle was performed. No intravenous contrast was administered. COMPARISON:  Right foot radiographs 06/07/2023 and 03/06/2012 FINDINGS: TENDONS Peroneal: Mild peroneus longus and brevis tenosynovitis at the level of the distal fibula and also within the distal tendon sheaths. The tendons otherwise intact. Posteromedial: Mild posterior tibial, flexor digitorum longus, and flexor hallucis longus tenosynovitis. Anterior: There is edema within the visualized portion of the distal extensor digitorum longus muscle (sagittal series 3, image 10 and axial series 5, image 1) that is greatest within the anterolateral aspect of the muscle, surrounding the fifth digit extensor tendon that also demonstrates a chevron configuration with an apparent decreased T1 increased T2 signal near fluid bright oval cyst within the anterior aspect of the partially split tendon suggesting a partial-thickness tear along an approximate 2.0 cm length (sagittal series image 9), with a anterior transverse dimension of the tear measuring up to 6 mm in transverse dimension (axial images 1 through 6). The more distal 5th extensor digitorum longus tendon appears intact. The extensor hallucis longus and extensor digitorum longus tendons are intact. Achilles: Intact. Plantar Fascia: Intact. LIGAMENTS Lateral: The anterior and posterior talofibular, anterior and posterior tibiofibular, and calcaneofibular ligaments are intact. Medial: The tibiotalar deep deltoid and tibial spring ligaments are intact. CARTILAGE Ankle Joint: Mild lateral tibiotalar cartilage thinning. Subtalar Joints/Sinus Tarsi: Mild-to-moderate posterior subtalar and middle subtalar cartilage thinning and peripheral osteophytosis. Mild anterior middle subtalar joint subchondral marrow edema.Fat is  preserved within the sinus tarsi. Bones: Mild dorsal talonavicular cartilage thinning, joint space narrowing, and peripheral osteophytosis. There is moderate marrow edema within the plantar aspect of the cuboid, with a somewhat curvilinear component (sagittal images 11 through 13, coronal images 31 through 33) that could represent a borderline fracture or highly healed fracture. No definite cortical erosion to suggest acute osteomyelitis. Mild-to-moderate tarsometatarsal cartilage thinning. Other: The tarsal tunnel is unremarkable. The Lisfranc ligament complex is intact. There is decreased T1 and increased T2 signal fluid with intermediate T2 signal complexity within the dorsal lateral ankle and midfoot, superficial to the extensor digitorum longus tendons and appearing to drain through an ulcer within the skin surface (axial series image 18), overall measuring up to approximately 4.6 cm in transverse dimension dorsal to the extensor hallucis and extensor digitorum longus the tendons (coronal series 7, image 36), 1.5 cm in transverse dimension at the more inferior aspect of this collection lateral to the talar neck (axial image 29), 3.9 cm in craniocaudal dimension (coronal image 29), and 6.3 cm along the longitudinal dimension of the foot (axial series 5, image 20). This has walled-off borders and is suspicious for an abscess. There is mild to moderate edema within the underlying extensor digitorum brevis musculature. No definite cortical erosion is seen. IMPRESSION: 1. There is a 4.6 x 3.9 x 6.3  cm complex fluid collection suspicious for an abscess within the dorsal lateral ankle and midfoot, superficial to the extensor digitorum longus tendons and appearing to drain through an ulcer within the skin surface. This is suspicious for an abscess. 2. There is edema within the visualized portion of the distal extensor digitorum longus muscle that is greatest within the anterolateral aspect of the muscle, surrounding  the fifth digit extensor tendon that also demonstrates a chevron configuration and partial-thickness longitudinal tear along an approximate 2.0 cm length. 3. There is moderate marrow edema within the plantar aspect of the cuboid, with a somewhat curvilinear component that could represent a borderline fracture or highly healed fracture. No definite cortical erosion to suggest acute osteomyelitis. 4. 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 5. Mild peroneus longus and brevis tenosynovitis at the level of the distal fibula and also within the distal tendon sheaths. 6. Mild posterior tibial, flexor digitorum longus, and flexor hallucis longus tenosynovitis. Electronically Signed   By: Neita Garnet M.D.   On: 06/08/2023 12:03   - pertinent xrays, CT, MRI studies were reviewed and independently interpreted  Positive ROS: All other systems have been reviewed and were otherwise negative with the exception of those mentioned in the HPI and as above.  Physical Exam: General: Alert, no acute distress Psychiatric: Patient is competent for consent with normal mood and affect Lymphatic: No axillary or cervical lymphadenopathy Cardiovascular: No pedal edema Respiratory: No cyanosis, no use of accessory musculature GI: No organomegaly, abdomen is soft and non-tender    Images:  @ENCIMAGES @  Labs:  Lab Results  Component Value Date   HGBA1C 5.5 06/08/2023   HGBA1C 5.8 (H) 04/29/2019   ESRSEDRATE 46 (H) 06/08/2023   CRP 19.9 (H) 06/08/2023   REPTSTATUS PENDING 06/08/2023   GRAMSTAIN  04/01/2020    RARE WBC PRESENT, PREDOMINANTLY PMN FEW GRAM POSITIVE COCCI    CULT  06/08/2023    NO GROWTH 1 DAY Performed at [facility], [address], [place], [place] [address]    LABORGA PSEUDOMONAS AERUGINOSA 04/14/2020   LABORGA ENTEROBACTER CLOACAE 04/14/2020   LABORGA ACINETOBACTER CALCOACETICUS/BAUMANNII COMPLEX 04/14/2020    Lab Results  Component Value Date    ALBUMIN 3.2 (L) 06/07/2023   ALBUMIN 2.7 (L) 05/02/2020   ALBUMIN 2.4 (L) 05/01/2020   PREALBUMIN <5 (L) 06/08/2023        Latest Ref Rng & Units 06/08/2023    6:26 PM 06/07/2023   10:39 PM 05/05/2020    8:15 AM  CBC EXTENDED  WBC 4.0 - 10.5 K/uL 6.9  10.8  13.2   RBC 4.22 - 5.81 MIL/uL 4.28  5.30  3.95   Hemoglobin 13.0 - 17.0 g/dL 60.4  54.0  98.1   HCT 39.0 - 52.0 % 36.1  43.9  32.1   Platelets 150 - 400 K/uL 291  359  158   NEUT# 1.7 - 7.7 K/uL  7.1    Lymph# 0.7 - 4.0 K/uL  2.3      Neurologic: Patient does not have protective sensation bilateral lower extremities.   MUSCULOSKELETAL:   Skin: Examination the abscess has decompressed there is a thin superficial eschar with granulation tissue beneath this.  There is no exposed tendons.  There is no ascending cellulitis.    Patient has a strong palpable dorsalis pedis pulse.  White cell count 6.9 with hemoglobin 11.8.  Sed rate 46 and C-reactive protein 19.9.  MRI scan 2 days ago showed an abscess without osteomyelitis.  The  abscess is currently decompressed.  Assessment: Assessment: Decompressed abscess dorsal lateral aspect of the right foot with superficial eschar.  Plan: Plan: I will write orders for Vashe cleansing and dressing changes daily.  If patient is still in the hospital on Wednesday I could proceed with surgical debridement in the operating room otherwise if he discharges sooner I could proceed with debridement and tissue grafting in the office.  Thank you for the consult and the opportunity to see Mr. Wynn Alldredge, MD [facility] [phone] 9:29 AM

## 2023-06-10 NOTE — Discharge Summary (Signed)
Physician Discharge Summary  Tomy Khim QMV:[id number] DOB: [birth year] DOA: 06/07/2023  PCP: Patient, [name] Per  Admit date: 06/07/2023 Discharge date: 06/10/2023  Admitted From: Home  Disposition:  Home                  Left AMA Recommendations for Outpatient Follow-up:  Follow up with Orthopedic surgery in 1-2 weeks Patient left AGAINST MEDICAL ADVICE  Home Health:n  Equipment/Devices:None   Discharge Condition:Stable  CODE STATUS:Full  Diet recommendation: Heart Healthy   Brief/Interim Summary: 37 y.o. male past medical history significant for polysubstance abuse/IVDA, MRSA bacteremia and candidemia hepatitis C major depressive disorder with psychosis comes in complaining of right foot cellulitis, with a small open wound wound with pus drainage.  Mild leukocytosis and lactic acidosis on admission blood cultures were ordered x-ray of the foot showed diffuse soft tissue edema no gas or osseous findings.  Started on IV fluids and antibiotics   Discharge Diagnoses:  Principal Problem:   Right foot infection Active Problems:   IVDU (intravenous drug user)   Cellulitis of right foot   Polysubstance abuse (HCC)   Wound, open, foot, right, initial encounter  Purulent Cellulitis of right foot: He had no signs of sepsis he was started empirically on IV vancomycin and Rocephin he remained afebrile. Blood cultures remain negative to date. MRI shows complex fluid abscess that is draining superficially no osteomyelitis. Orthopedic surgery was consulted who recommended ABIs. The patient had a very erratic behavior with belligerent behavior towards the staff and myself. Orthopedic surgery related that they could perform surgery on Wednesday or in his office as an outpatient. The nurse called me to see the patient before I had time to get to talk to him he had left the hospital AGAINST MEDICAL ADVICE. For pain we used Tylenol, tramadol and Toradol. He only used Toradol  once. But he demanded that he be given narcotics.Marland Kitchen   IVDA: Relates he has not been using heroin for the last year. The day prior to leaving AGAINST MEDICAL ADVICE he was becoming belligerent and swearing at me. He relates he would not take anything that it was not a narcotic like hydromorphone. When I went to the room the day he left AGAINST MEDICAL ADVICE he was sleeping and I had to wake him up.  Narcotic seeking behavior: He relates he had not been using any heroin for over a year, but his behavior, demeanor, being belligerent and swearing with not agreeing to his terms of only using hydromorphone for pain control is concerning. I did explain to him that he could use at least try ketorolac, Tylenol and tramadol. He relates he is allergic to tramadol, which he used in the past and  it cause him to be confused, which I explain to is not a true allergy.  He was advised to try to at least take it once to see if it works but he adamantly refused and started yelling at me.  Discharge Instructions   Allergies as of 06/10/2023       Reactions   Codeine Nausea And Vomiting   Hydrocodone Itching   Tramadol Other (See Comments)   "messes with head"        Medication List    You have not been prescribed any medications.     Follow-up Information     Nadara Mustard, MD Follow up in 1 week(s).   Specialty: Orthopedic Surgery Contact information: [address] [place] [address] [phone]  Allergies  Allergen Reactions   Codeine Nausea And Vomiting   Hydrocodone Itching   Tramadol Other (See Comments)    "messes with head"    Consultations: Ortho   Procedures/Studies: MR ANKLE RIGHT WO CONTRAST  Result Date: 06/08/2023 CLINICAL DATA:  37 year old male with history of polysubstance abuse, IV drug use, MRSA bacteremia, candidemia, hepatitis-C with right foot infection. The patient reports this initially started as "a small bump" on his right  foot which has now gotten progressively more swollen and red. Now with open wound and pus drainage. EXAM: MRI OF THE RIGHT ANKLE WITHOUT CONTRAST TECHNIQUE: Multiplanar, multisequence MR imaging of the ankle was performed. No intravenous contrast was administered. COMPARISON:  Right foot radiographs 06/07/2023 and 03/06/2012 FINDINGS: TENDONS Peroneal: Mild peroneus longus and brevis tenosynovitis at the level of the distal fibula and also within the distal tendon sheaths. The tendons otherwise intact. Posteromedial: Mild posterior tibial, flexor digitorum longus, and flexor hallucis longus tenosynovitis. Anterior: There is edema within the visualized portion of the distal extensor digitorum longus muscle (sagittal series 3, image 10 and axial series 5, image 1) that is greatest within the anterolateral aspect of the muscle, surrounding the fifth digit extensor tendon that also demonstrates a chevron configuration with an apparent decreased T1 increased T2 signal near fluid bright oval cyst within the anterior aspect of the partially split tendon suggesting a partial-thickness tear along an approximate 2.0 cm length (sagittal series image 9), with a anterior transverse dimension of the tear measuring up to 6 mm in transverse dimension (axial images 1 through 6). The more distal 5th extensor digitorum longus tendon appears intact. The extensor hallucis longus and extensor digitorum longus tendons are intact. Achilles: Intact. Plantar Fascia: Intact. LIGAMENTS Lateral: The anterior and posterior talofibular, anterior and posterior tibiofibular, and calcaneofibular ligaments are intact. Medial: The tibiotalar deep deltoid and tibial spring ligaments are intact. CARTILAGE Ankle Joint: Mild lateral tibiotalar cartilage thinning. Subtalar Joints/Sinus Tarsi: Mild-to-moderate posterior subtalar and middle subtalar cartilage thinning and peripheral osteophytosis. Mild anterior middle subtalar joint subchondral marrow  edema.Fat is preserved within the sinus tarsi. Bones: Mild dorsal talonavicular cartilage thinning, joint space narrowing, and peripheral osteophytosis. There is moderate marrow edema within the plantar aspect of the cuboid, with a somewhat curvilinear component (sagittal images 11 through 13, coronal images 31 through 33) that could represent a borderline fracture or highly healed fracture. No definite cortical erosion to suggest acute osteomyelitis. Mild-to-moderate tarsometatarsal cartilage thinning. Other: The tarsal tunnel is unremarkable. The Lisfranc ligament complex is intact. There is decreased T1 and increased T2 signal fluid with intermediate T2 signal complexity within the dorsal lateral ankle and midfoot, superficial to the extensor digitorum longus tendons and appearing to drain through an ulcer within the skin surface (axial series image 18), overall measuring up to approximately 4.6 cm in transverse dimension dorsal to the extensor hallucis and extensor digitorum longus the tendons (coronal series 7, image 36), 1.5 cm in transverse dimension at the more inferior aspect of this collection lateral to the talar neck (axial image 29), 3.9 cm in craniocaudal dimension (coronal image 29), and 6.3 cm along the longitudinal dimension of the foot (axial series 5, image 20). This has walled-off borders and is suspicious for an abscess. There is mild to moderate edema within the underlying extensor digitorum brevis musculature. No definite cortical erosion is seen. IMPRESSION: 1. There is a 4.6 x 3.9 x 6.3 cm complex fluid collection suspicious for an abscess within the dorsal lateral ankle and midfoot,  superficial to the extensor digitorum longus tendons and appearing to drain through an ulcer within the skin surface. This is suspicious for an abscess. 2. There is edema within the visualized portion of the distal extensor digitorum longus muscle that is greatest within the anterolateral aspect of the muscle,  surrounding the fifth digit extensor tendon that also demonstrates a chevron configuration and partial-thickness longitudinal tear along an approximate 2.0 cm length. 3. There is moderate marrow edema within the plantar aspect of the cuboid, with a somewhat curvilinear component that could represent a borderline fracture or highly healed fracture. No definite cortical erosion to suggest acute osteomyelitis. 4. 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 5. Mild peroneus longus and brevis tenosynovitis at the level of the distal fibula and also within the distal tendon sheaths. 6. Mild posterior tibial, flexor digitorum longus, and flexor hallucis longus tenosynovitis. Electronically Signed   By: Neita Garnet M.D.   On: 06/08/2023 12:03   DG Foot Complete Right  Result Date: 06/07/2023 CLINICAL DATA:  Wound and swelling. EXAM: RIGHT FOOT COMPLETE - 3+ VIEW COMPARISON:  None Available. FINDINGS: There is no evidence of fracture or dislocation. No erosive change, periostitis or bone destruction. There is no evidence of arthropathy or other focal bone abnormality. A dressing overlies the midfoot. Diffuse soft tissue edema about the dorsal and lateral midfoot. No soft tissue gas or radiopaque foreign body allowing for overlying dressing. IMPRESSION: Diffuse soft tissue edema about the dorsal and lateral midfoot. No soft tissue gas or radiopaque foreign body. No acute osseous findings. Electronically Signed   By: Narda Rutherford M.D.   On: 06/07/2023 23:50   (Echo, Carotid, EGD, Colonoscopy, ERCP)    Subjective: Did not wanted to speak to me  Discharge Exam: Vitals:   06/09/23 2019 06/09/23 2349  BP: 123/77 108/66  Pulse: 66 68  Resp: 18 18  Temp: 98.6 F (37 C) 98.8 F (37.1 C)  SpO2: 100% 97%   Vitals:   06/09/23 1150 06/09/23 1637 06/09/23 2019 06/09/23 2349  BP: 112/81 114/72 123/77 108/66  Pulse: 61 64 66 68  Resp:  18 18 18   Temp: (!) 97.4 F (36.3 C) 98.1 F  (36.7 C) 98.6 F (37 C) 98.8 F (37.1 C)  TempSrc: Oral Oral Oral Oral  SpO2:  96% 100% 97%  Weight:      Height:        General: Pt is alert, awake, not in acute distress Cardiovascular: RRR, S1/S2 +, no rubs, no gallops Respiratory: CTA bilaterally, no wheezing, no rhonchi Abdominal: Soft, NT, ND, bowel sounds + Extremities: no edema, no cyanosis    The results of significant diagnostics from this hospitalization (including imaging, microbiology, ancillary and laboratory) are listed below for reference.     Microbiology: Recent Results (from the past 240 hour(s))  Blood culture (routine x 2)     Status: None (Preliminary result)   Collection Time: 06/07/23 11:14 PM   Specimen: BLOOD LEFT HAND  Result Value Ref Range Status   Specimen Description BLOOD LEFT HAND  Final   Special Requests   Final    BOTTLES DRAWN AEROBIC AND ANAEROBIC Blood Culture adequate volume   Culture   Final    NO GROWTH 3 DAYS Performed at [facility], [address] [place], [place] [address]    Report Status PENDING  Incomplete  Blood culture (routine x 2)     Status: None (Preliminary result)   Collection Time: 06/08/23  5:14 AM  Specimen: BLOOD LEFT ARM  Result Value Ref Range Status   Specimen Description BLOOD LEFT ARM  Final   Special Requests AEROBIC BOTTLE ONLY Blood Culture adequate volume  Final   Culture   Final    NO GROWTH 2 DAYS Performed at [facility], [address], [place], [place] [address]    Report Status PENDING  Incomplete     Labs: BNP (last 3 results) No results for input(s): "BNP" in the last 8760 hours. Basic Metabolic Panel: Recent Labs  Lab 06/07/23 2239  NA 139  K 3.8  CL 100  CO2 27  GLUCOSE 101*  BUN 11  CREATININE 0.79  CALCIUM 9.1   Liver Function Tests: Recent Labs  Lab 06/07/23 2239  AST 39  ALT 30  ALKPHOS 75  BILITOT 0.9  PROT 8.4*  ALBUMIN 3.2*   No results for input(s): "LIPASE", "AMYLASE" in the last 168  hours. No results for input(s): "AMMONIA" in the last 168 hours. CBC: Recent Labs  Lab 06/07/23 2239 06/08/23 1826  WBC 10.8* 6.9  NEUTROABS 7.1  --   HGB 14.3 11.8*  HCT 43.9 36.1*  MCV 82.8 84.3  PLT 359 291   Cardiac Enzymes: No results for input(s): "CKTOTAL", "CKMB", "CKMBINDEX", "TROPONINI" in the last 168 hours. BNP: Invalid input(s): "POCBNP" CBG: No results for input(s): "GLUCAP" in the last 168 hours. D-Dimer No results for input(s): "DDIMER" in the last 72 hours. Hgb A1c Recent Labs    06/08/23 0506  HGBA1C 5.5   Lipid Profile No results for input(s): "CHOL", "HDL", "LDLCALC", "TRIG", "CHOLHDL", "LDLDIRECT" in the last 72 hours. Thyroid function studies No results for input(s): "TSH", "T4TOTAL", "T3FREE", "THYROIDAB" in the last 72 hours.  Invalid input(s): "FREET3" Anemia work up No results for input(s): "VITAMINB12", "FOLATE", "FERRITIN", "TIBC", "IRON", "RETICCTPCT" in the last 72 hours. Urinalysis    Component Value Date/Time   COLORURINE AMBER (A) 04/30/2020 0448   APPEARANCEUR HAZY (A) 04/30/2020 0448   LABSPEC 1.029 04/30/2020 0448   PHURINE 5.0 04/30/2020 0448   GLUCOSEU NEGATIVE 04/30/2020 0448   HGBUR NEGATIVE 04/30/2020 0448   BILIRUBINUR NEGATIVE 04/30/2020 0448   KETONESUR NEGATIVE 04/30/2020 0448   PROTEINUR 30 (A) 04/30/2020 0448   UROBILINOGEN 1.0 02/09/2011 2210   NITRITE NEGATIVE 04/30/2020 0448   LEUKOCYTESUR MODERATE (A) 04/30/2020 0448   Sepsis Labs Recent Labs  Lab 06/07/23 2239 06/08/23 1826  WBC 10.8* 6.9   Microbiology Recent Results (from the past 240 hour(s))  Blood culture (routine x 2)     Status: None (Preliminary result)   Collection Time: 06/07/23 11:14 PM   Specimen: BLOOD LEFT HAND  Result Value Ref Range Status   Specimen Description BLOOD LEFT HAND  Final   Special Requests   Final    BOTTLES DRAWN AEROBIC AND ANAEROBIC Blood Culture adequate volume   Culture   Final    NO GROWTH 3 DAYS Performed at  [facility], [address], [place], [place] [address]    Report Status PENDING  Incomplete  Blood culture (routine x 2)     Status: None (Preliminary result)   Collection Time: 06/08/23  5:14 AM   Specimen: BLOOD LEFT ARM  Result Value Ref Range Status   Specimen Description BLOOD LEFT ARM  Final   Special Requests AEROBIC BOTTLE ONLY Blood Culture adequate volume  Final   Culture   Final    NO GROWTH 2 DAYS Performed at [facility], [address] [place], [place] [address]    Report Status PENDING  Incomplete     Time coordinating discharge: Over 35 minutes  SIGNED:   Marinda Elk, MD  [facility] 06/10/2023, 1:05 PM Pager   If 7PM-7AM, please contact night-coverage [email or web address] Password [email or web address]

## 2023-06-10 NOTE — Plan of Care (Signed)

## 2023-06-10 NOTE — Progress Notes (Signed)
[facility] PROGRESS NOTE    Progress Note  Jon Castro  NWG:[id number] DOB: Jun 10, 1986 DOA: 06/07/2023 PCP: Patient, [name] Per     Brief Narrative:   Jon Castro is an 37 y.o. male past medical history significant for polysubstance abuse/IVDA, MRSA bacteremia and candidemia hepatitis C major depressive disorder with psychosis comes in complaining of right foot cellulitis, with a small open wound wound with pus drainage.  Mild leukocytosis and lactic acidosis on admission blood cultures were ordered x-ray of the foot showed diffuse soft tissue edema no gas or osseous findings.  Started on IV fluids and antibiotics.  Assessment/Plan:   Purulent Cellulitis of right foot: Continue IV Vanco and Rocephin.  Has remained afebrile with no leukocytosis. Afebrile, blood cultures negative till date. MRI showed a complex fluid abscess that is draining no acute osteomyelitis. Orthopedic surgery was consulted ABIs have been ordered awaiting results. Awaiting orthopedic surgery further recommendations. Continue Tylenol tramadol and Toradol for pain.  He is having minimal pain is only used 1 dose of ketorolac.  IVDA: Relates he has not been using heroin for the last year. Yesterday he was belligerent and swearing at me related that he was not good to take anything that was not narcotic.  He is more calm no complaints tolerating his diet   DVT prophylaxis: lovenox Family Communication:none Status is: Inpatient Remains inpatient appropriate because: Right foot cellulitis    Code Status:     Code Status Orders  (From admission, onward)           Start     Ordered   06/08/23 0446  Full code  Continuous       Question:  By:  Answer:  Consent: discussion documented in EHR   06/08/23 0447           Code Status History     Date Active Date Inactive Code Status Order ID Comments User Context   04/30/2020 0950 05/05/2020 1903 Full Code [id number]  Claudean Severance, MD ED   04/01/2020 1324 04/22/2020 2130 Full Code [id number]  Yvette Rack, MD ED   01/20/2020 0721 01/20/2020 1823 Full Code [id number]  Anselm Pancoast, PA-C ED   01/02/2020 0314 01/03/2020 1728 Full Code [id number]  Garlon Hatchet, PA-C ED   10/30/2019 1252 11/03/2019 1646 Full Code [id number]  Aldean Baker, NP Inpatient   10/30/2019 0226 10/30/2019 1156 Full Code [id number]  Zadie Rhine, MD ED   10/13/2019 0241 10/15/2019 1727 Full Code [id number]  Pearson Grippe, MD ED   05/30/2019 1418 05/31/2019 1912 Full Code [id number]  Cristina Gong, PA-C ED   04/28/2019 0334 05/19/2019 1839 Full Code [id number]  Rometta Emery, MD Inpatient   04/22/2019 0253 04/23/2019 1454 Full Code [id number]  Mesner, Barbara Cower, MD ED   02/21/2019 2325 02/22/2019 1828 Full Code [id number]  Garlon Hatchet, PA-C ED   02/07/2019 2000 02/11/2019 1553 Full Code [id number]  Money, Gerlene Burdock, FNP Inpatient         IV Access:   Peripheral IV   Procedures and diagnostic studies:   MR ANKLE RIGHT WO CONTRAST  Result Date: 06/08/2023 CLINICAL DATA:  37 year old male with history of polysubstance abuse, IV drug use, MRSA bacteremia, candidemia, hepatitis-C with right foot infection. The patient reports this initially started as "a small bump" on his right foot which has now gotten progressively more swollen and red. Now with open wound and pus drainage. EXAM: MRI OF THE RIGHT ANKLE WITHOUT CONTRAST  TECHNIQUE: Multiplanar, multisequence MR imaging of the ankle was performed. No intravenous contrast was administered. COMPARISON:  Right foot radiographs 06/07/2023 and 03/06/2012 FINDINGS: TENDONS Peroneal: Mild peroneus longus and brevis tenosynovitis at the level of the distal fibula and also within the distal tendon sheaths. The tendons otherwise intact. Posteromedial: Mild posterior tibial, flexor digitorum longus, and flexor hallucis longus tenosynovitis. Anterior: There is edema within the visualized portion of the distal extensor digitorum longus  muscle (sagittal series 3, image 10 and axial series 5, image 1) that is greatest within the anterolateral aspect of the muscle, surrounding the fifth digit extensor tendon that also demonstrates a chevron configuration with an apparent decreased T1 increased T2 signal near fluid bright oval cyst within the anterior aspect of the partially split tendon suggesting a partial-thickness tear along an approximate 2.0 cm length (sagittal series image 9), with a anterior transverse dimension of the tear measuring up to 6 mm in transverse dimension (axial images 1 through 6). The more distal 5th extensor digitorum longus tendon appears intact. The extensor hallucis longus and extensor digitorum longus tendons are intact. Achilles: Intact. Plantar Fascia: Intact. LIGAMENTS Lateral: The anterior and posterior talofibular, anterior and posterior tibiofibular, and calcaneofibular ligaments are intact. Medial: The tibiotalar deep deltoid and tibial spring ligaments are intact. CARTILAGE Ankle Joint: Mild lateral tibiotalar cartilage thinning. Subtalar Joints/Sinus Tarsi: Mild-to-moderate posterior subtalar and middle subtalar cartilage thinning and peripheral osteophytosis. Mild anterior middle subtalar joint subchondral marrow edema.Fat is preserved within the sinus tarsi. Bones: Mild dorsal talonavicular cartilage thinning, joint space narrowing, and peripheral osteophytosis. There is moderate marrow edema within the plantar aspect of the cuboid, with a somewhat curvilinear component (sagittal images 11 through 13, coronal images 31 through 33) that could represent a borderline fracture or highly healed fracture. No definite cortical erosion to suggest acute osteomyelitis. Mild-to-moderate tarsometatarsal cartilage thinning. Other: The tarsal tunnel is unremarkable. The Lisfranc ligament complex is intact. There is decreased T1 and increased T2 signal fluid with intermediate T2 signal complexity within the dorsal lateral ankle  and midfoot, superficial to the extensor digitorum longus tendons and appearing to drain through an ulcer within the skin surface (axial series image 18), overall measuring up to approximately 4.6 cm in transverse dimension dorsal to the extensor hallucis and extensor digitorum longus the tendons (coronal series 7, image 36), 1.5 cm in transverse dimension at the more inferior aspect of this collection lateral to the talar neck (axial image 29), 3.9 cm in craniocaudal dimension (coronal image 29), and 6.3 cm along the longitudinal dimension of the foot (axial series 5, image 20). This has walled-off borders and is suspicious for an abscess. There is mild to moderate edema within the underlying extensor digitorum brevis musculature. No definite cortical erosion is seen. IMPRESSION: 1. There is a 4.6 x 3.9 x 6.3 cm complex fluid collection suspicious for an abscess within the dorsal lateral ankle and midfoot, superficial to the extensor digitorum longus tendons and appearing to drain through an ulcer within the skin surface. This is suspicious for an abscess. 2. There is edema within the visualized portion of the distal extensor digitorum longus muscle that is greatest within the anterolateral aspect of the muscle, surrounding the fifth digit extensor tendon that also demonstrates a chevron configuration and partial-thickness longitudinal tear along an approximate 2.0 cm length. 3. There is moderate marrow edema within the plantar aspect of the cuboid, with a somewhat curvilinear component that could represent a borderline fracture or highly healed fracture. No definite cortical  erosion to suggest acute osteomyelitis. 4. 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 5. Mild peroneus longus and brevis tenosynovitis at the level of the distal fibula and also within the distal tendon sheaths. 6. Mild posterior tibial, flexor digitorum longus, and flexor hallucis longus tenosynovitis.  Electronically Signed   By: Neita Garnet M.D.   On: 06/08/2023 12:03     Medical Consultants:   None.   Subjective:    Francoise Schaumann Runco's he did not complain of pain this morning.  Objective:    Vitals:   06/09/23 1150 06/09/23 1637 06/09/23 2019 06/09/23 2349  BP: 112/81 114/72 123/77 108/66  Pulse: 61 64 66 68  Resp:  18 18 18   Temp: (!) 97.4 F (36.3 C) 98.1 F (36.7 C) 98.6 F (37 C) 98.8 F (37.1 C)  TempSrc: Oral Oral Oral Oral  SpO2:  96% 100% 97%  Weight:      Height:       SpO2: 97 %   Intake/Output Summary (Last 24 hours) at 06/10/2023 0837 Last data filed at 06/09/2023 2350 Gross per 24 hour  Intake 106.13 ml  Output 765 ml  Net -658.87 ml   Filed Weights   06/08/23 0410  Weight: 79.4 kg    Exam: General exam: In no acute distress. Respiratory system: Good air movement and clear to auscultation. Cardiovascular system: S1 & S2 heard, RRR. No JVD. Gastrointestinal system: Abdomen is nondistended, soft and nontender.  Extremities: No pedal edema. Skin: No rashes, lesions or ulcers Psychiatry: Judgement and insight appear normal. Mood & affect appropriate.  Data Reviewed:    Labs: Basic Metabolic Panel: Recent Labs  Lab 06/07/23 2239  NA 139  K 3.8  CL 100  CO2 27  GLUCOSE 101*  BUN 11  CREATININE 0.79  CALCIUM 9.1   GFR Estimated Creatinine Clearance: 140.6 mL/min (by C-G formula based on SCr of 0.79 mg/dL). Liver Function Tests: Recent Labs  Lab 06/07/23 2239  AST 39  ALT 30  ALKPHOS 75  BILITOT 0.9  PROT 8.4*  ALBUMIN 3.2*   No results for input(s): "LIPASE", "AMYLASE" in the last 168 hours. No results for input(s): "AMMONIA" in the last 168 hours. Coagulation profile No results for input(s): "INR", "PROTIME" in the last 168 hours. COVID-19 Labs  Recent Labs    06/08/23 0506  CRP 19.9*    Lab Results  Component Value Date   SARSCOV2NAA POSITIVE (A) 04/07/2020   SARSCOV2NAA POSITIVE (A) 04/01/2020    SARSCOV2NAA NEGATIVE 01/20/2020   SARSCOV2NAA NEGATIVE 01/02/2020    CBC: Recent Labs  Lab 06/07/23 2239 06/08/23 1826  WBC 10.8* 6.9  NEUTROABS 7.1  --   HGB 14.3 11.8*  HCT 43.9 36.1*  MCV 82.8 84.3  PLT 359 291   Cardiac Enzymes: No results for input(s): "CKTOTAL", "CKMB", "CKMBINDEX", "TROPONINI" in the last 168 hours. BNP (last 3 results) No results for input(s): "PROBNP" in the last 8760 hours. CBG: No results for input(s): "GLUCAP" in the last 168 hours. D-Dimer: No results for input(s): "DDIMER" in the last 72 hours. Hgb A1c: Recent Labs    06/08/23 0506  HGBA1C 5.5   Lipid Profile: No results for input(s): "CHOL", "HDL", "LDLCALC", "TRIG", "CHOLHDL", "LDLDIRECT" in the last 72 hours. Thyroid function studies: No results for input(s): "TSH", "T4TOTAL", "T3FREE", "THYROIDAB" in the last 72 hours.  Invalid input(s): "FREET3" Anemia work up: No results for input(s): "VITAMINB12", "FOLATE", "FERRITIN", "TIBC", "IRON", "RETICCTPCT" in the last 72 hours. Sepsis Labs: Recent Labs  Lab 06/07/23 2239 06/07/23 2321 06/08/23 0545 06/08/23 1826  WBC 10.8*  --   --  6.9  LATICACIDVEN  --  2.8* 1.4  --    Microbiology Recent Results (from the past 240 hour(s))  Blood culture (routine x 2)     Status: None (Preliminary result)   Collection Time: 06/07/23 11:14 PM   Specimen: BLOOD LEFT HAND  Result Value Ref Range Status   Specimen Description BLOOD LEFT HAND  Final   Special Requests   Final    BOTTLES DRAWN AEROBIC AND ANAEROBIC Blood Culture adequate volume   Culture   Final    NO GROWTH 2 DAYS Performed at [facility], [address], [place], [place] [address]    Report Status PENDING  Incomplete  Blood culture (routine x 2)     Status: None (Preliminary result)   Collection Time: 06/08/23  5:14 AM   Specimen: BLOOD LEFT ARM  Result Value Ref Range Status   Specimen Description BLOOD LEFT ARM  Final   Special Requests AEROBIC BOTTLE ONLY Blood  Culture adequate volume  Final   Culture   Final    NO GROWTH 1 DAY Performed at [facility], [address], [place], [place] [address]    Report Status PENDING  Incomplete     Medications:    enoxaparin (LOVENOX) injection  40 mg Subcutaneous Q24H   feeding supplement  237 mL Oral TID BM   multivitamin with minerals  1 tablet Oral Daily   pantoprazole  40 mg Oral BID   Continuous Infusions:  cefTRIAXone (ROCEPHIN)  IV Stopped (06/09/23 1446)   vancomycin 1,500 mg (06/09/23 1737)      LOS: 2 days   Marinda Elk  [facility]  06/10/2023, 8:37 AM

## 2023-06-12 LAB — CULTURE, BLOOD (ROUTINE X 2)
Culture: NO GROWTH
Special Requests: ADEQUATE

## 2023-06-13 LAB — CULTURE, BLOOD (ROUTINE X 2)
Culture: NO GROWTH
Special Requests: ADEQUATE

## 2023-06-18 ENCOUNTER — Emergency Department (HOSPITAL_COMMUNITY)
Admission: EM | Admit: 2023-06-18 | Discharge: 2023-06-18 | Disposition: A | Discharge status: Home / Self Care | Payer: 59 | Source: Home / Self Care | Attending: Emergency Medicine | Admitting: Emergency Medicine

## 2023-06-18 ENCOUNTER — Emergency Department (HOSPITAL_COMMUNITY): Payer: 59

## 2023-06-18 ENCOUNTER — Other Ambulatory Visit: Payer: Self-pay

## 2023-06-18 ENCOUNTER — Emergency Department (HOSPITAL_COMMUNITY)
Admission: EM | Admit: 2023-06-18 | Discharge: 2023-06-18 | Discharge status: Against Medical Advice | Payer: 59 | Attending: Emergency Medicine | Admitting: Emergency Medicine

## 2023-06-18 ENCOUNTER — Encounter (HOSPITAL_COMMUNITY): Payer: Self-pay

## 2023-06-18 DIAGNOSIS — R0781 Pleurodynia: Secondary | ICD-10-CM | POA: Diagnosis not present

## 2023-06-18 DIAGNOSIS — Z5329 Procedure and treatment not carried out because of patient's decision for other reasons: Secondary | ICD-10-CM

## 2023-06-18 DIAGNOSIS — Z5321 Procedure and treatment not carried out due to patient leaving prior to being seen by health care provider: Secondary | ICD-10-CM | POA: Insufficient documentation

## 2023-06-18 DIAGNOSIS — L0889 Other specified local infections of the skin and subcutaneous tissue: Secondary | ICD-10-CM | POA: Insufficient documentation

## 2023-06-18 DIAGNOSIS — L089 Local infection of the skin and subcutaneous tissue, unspecified: Secondary | ICD-10-CM | POA: Diagnosis present

## 2023-06-18 DIAGNOSIS — L03115 Cellulitis of right lower limb: Secondary | ICD-10-CM | POA: Insufficient documentation

## 2023-06-18 LAB — CBC WITH DIFFERENTIAL/PLATELET
Abs Immature Granulocytes: 0.04 10*3/uL (ref 0.00–0.07)
Basophils Absolute: 0.1 10*3/uL (ref 0.0–0.1)
Basophils Relative: 1 %
Eosinophils Absolute: 0.2 10*3/uL (ref 0.0–0.5)
Eosinophils Relative: 2 %
HCT: 41.3 % (ref 39.0–52.0)
Hemoglobin: 13.1 g/dL (ref 13.0–17.0)
Immature Granulocytes: 0 %
Lymphocytes Relative: 27 %
Lymphs Abs: 2.7 10*3/uL (ref 0.7–4.0)
MCH: 26.7 pg (ref 26.0–34.0)
MCHC: 31.7 g/dL (ref 30.0–36.0)
MCV: 84.1 fL (ref 80.0–100.0)
Monocytes Absolute: 0.9 10*3/uL (ref 0.1–1.0)
Monocytes Relative: 9 %
Neutro Abs: 6 10*3/uL (ref 1.7–7.7)
Neutrophils Relative %: 61 %
Platelets: 363 10*3/uL (ref 150–400)
RBC: 4.91 MIL/uL (ref 4.22–5.81)
RDW: 13.5 % (ref 11.5–15.5)
WBC: 10 10*3/uL (ref 4.0–10.5)
nRBC: 0 % (ref 0.0–0.2)

## 2023-06-18 LAB — COMPREHENSIVE METABOLIC PANEL
ALT: 43 U/L (ref 0–44)
AST: 48 U/L — ABNORMAL HIGH (ref 15–41)
Albumin: 3.2 g/dL — ABNORMAL LOW (ref 3.5–5.0)
Alkaline Phosphatase: 67 U/L (ref 38–126)
Anion gap: 9 (ref 5–15)
BUN: 10 mg/dL (ref 6–20)
CO2: 23 mmol/L (ref 22–32)
Calcium: 8.9 mg/dL (ref 8.9–10.3)
Chloride: 104 mmol/L (ref 98–111)
Creatinine, Ser: 0.83 mg/dL (ref 0.61–1.24)
GFR, Estimated: >60 mL/min (ref >60)
Glucose, Bld: 111 mg/dL — ABNORMAL HIGH (ref 70–99)
Potassium: 4.5 mmol/L (ref 3.5–5.1)
Sodium: 136 mmol/L (ref 135–145)
Total Bilirubin: 0.7 mg/dL (ref 0.3–1.2)
Total Protein: 7.8 g/dL (ref 6.5–8.1)

## 2023-06-18 LAB — I-STAT CG4 LACTIC ACID, ED: Lactic Acid, Venous: 0.8 mmol/L (ref 0.5–1.9)

## 2023-06-18 MED ORDER — VANCOMYCIN HCL 1500 MG/300ML IV SOLN
1500.0000 mg | Freq: Once | INTRAVENOUS | Status: DC
  Filled 2023-06-18: qty 300

## 2023-06-18 MED ORDER — ACETAMINOPHEN 500 MG PO TABS: 1000.0000 mg | ORAL_TABLET | Freq: Once | ORAL | Status: DC

## 2023-06-18 MED ORDER — DOXYCYCLINE HYCLATE 100 MG PO CAPS: 100.0000 mg | ORAL_CAPSULE | Freq: Two times a day (BID) | ORAL | 0 refills | Status: AC

## 2023-06-18 MED ORDER — LIDOCAINE 5 % EX PTCH: 1.0000 | MEDICATED_PATCH | Freq: Once | CUTANEOUS | Status: DC

## 2023-06-18 MED ORDER — DOXYCYCLINE HYCLATE 100 MG PO TABS
100.0000 mg | ORAL_TABLET | Freq: Once | ORAL | Status: AC
  Administered 2023-06-18: 100 mg via ORAL
  Filled 2023-06-18: qty 1

## 2023-06-18 MED ORDER — KETOROLAC TROMETHAMINE 15 MG/ML IJ SOLN: 15.0000 mg | Freq: Once | INTRAMUSCULAR | Status: DC

## 2023-06-18 MED ORDER — CEPHALEXIN 250 MG PO CAPS
500.0000 mg | ORAL_CAPSULE | Freq: Once | ORAL | Status: AC
  Administered 2023-06-18: 500 mg via ORAL
  Filled 2023-06-18: qty 2

## 2023-06-18 MED ORDER — CEPHALEXIN 500 MG PO CAPS: 500.0000 mg | ORAL_CAPSULE | Freq: Four times a day (QID) | ORAL | 0 refills | Status: AC

## 2023-06-18 NOTE — ED Notes (Signed)
1st set of cultures drawn as well.

## 2023-06-18 NOTE — ED Notes (Signed)
Patient is going to leave AMA.  MD aware but wants to order oral meds prior to him leaving

## 2023-06-18 NOTE — ED Notes (Signed)
Attempted IV, ex plained what meds were ordered for his pain and patient states "why are yall treating me like a druggie I am not staying here if yall are not going to give me narcotics because I will get sick from withdraw".  Tried to explain we can treat the withdraw symptoms but patient stated "everyone treats me like shit and if you aren't going to treat my pain with what I want then ill leave ". Md notified
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
## 2023-06-18 NOTE — ED Triage Notes (Signed)
Patient reports chronic right foot wound infection with drainage for several weeks , he adds right ribcage pain , denies fever or chills . Ambulatory / respirations unlabored .
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
## 2023-06-18 NOTE — Progress Notes (Signed)
ED Pharmacy Antibiotic Sign Off An antibiotic consult was received from an ED provider for vancomycin per pharmacy dosing for wound infection. A chart review was completed to assess appropriateness.   The following one time order(s) were placed:  Vancomycin 1500mg  x1   Further antibiotic and/or antibiotic pharmacy consults should be ordered by the admitting provider if indicated.   Thank you for allowing pharmacy to be a part of this patient's care.   Estill Batten, PharmD, BCCCP  Clinical Pharmacist 06/18/23 10:52 AM

## 2023-06-18 NOTE — ED Notes (Signed)
Pt name was called 3x at 0524, no answer.

## 2023-06-18 NOTE — ED Notes (Signed)
Attempted IV start for vanc and was unsuccessful.  Waiting for MD to speak with patient about his options since refusing things and then will consult IV team.

## 2023-08-08 ENCOUNTER — Emergency Department (HOSPITAL_COMMUNITY): Payer: 59

## 2023-08-08 ENCOUNTER — Encounter (HOSPITAL_COMMUNITY): Payer: Self-pay

## 2023-08-08 ENCOUNTER — Inpatient Hospital Stay (HOSPITAL_COMMUNITY)
Admission: EM | Admit: 2023-08-08 | Discharge: 2023-08-13 | DRG: 603 | Disposition: A | Discharge status: Home / Self Care | Payer: 59 | Attending: Internal Medicine | Admitting: Internal Medicine

## 2023-08-08 DIAGNOSIS — F1110 Opioid abuse, uncomplicated: Secondary | ICD-10-CM | POA: Diagnosis present

## 2023-08-08 DIAGNOSIS — L03116 Cellulitis of left lower limb: Principal | ICD-10-CM | POA: Diagnosis present

## 2023-08-08 DIAGNOSIS — L089 Local infection of the skin and subcutaneous tissue, unspecified: Secondary | ICD-10-CM | POA: Diagnosis present

## 2023-08-08 DIAGNOSIS — L02612 Cutaneous abscess of left foot: Secondary | ICD-10-CM | POA: Diagnosis present

## 2023-08-08 DIAGNOSIS — B182 Chronic viral hepatitis C: Secondary | ICD-10-CM | POA: Diagnosis present

## 2023-08-08 DIAGNOSIS — Z885 Allergy status to narcotic agent status: Secondary | ICD-10-CM

## 2023-08-08 DIAGNOSIS — F1910 Other psychoactive substance abuse, uncomplicated: Secondary | ICD-10-CM | POA: Diagnosis present

## 2023-08-08 DIAGNOSIS — L97529 Non-pressure chronic ulcer of other part of left foot with unspecified severity: Secondary | ICD-10-CM | POA: Diagnosis present

## 2023-08-08 DIAGNOSIS — F17210 Nicotine dependence, cigarettes, uncomplicated: Secondary | ICD-10-CM | POA: Diagnosis present

## 2023-08-08 MED ORDER — DOXYCYCLINE HYCLATE 100 MG IV SOLR
100.0000 mg | Freq: Once | INTRAVENOUS | Status: AC
  Administered 2023-08-09: 100 mg via INTRAVENOUS
  Filled 2023-08-08: qty 100

## 2023-08-08 NOTE — ED Provider Notes (Signed)
[place] EMERGENCY DEPARTMENT AT [facility] Provider Note   CSN: [id number] Arrival date & time: 08/08/23  1432     History  Chief Complaint  Patient presents with   Foot Pain    Jon Castro is a 37 y.o. male.  With a history of polysubstance abuse presenting to the ED for evaluation of left foot wound.  He noticed swelling, erythema and pain to the dorsum of the left foot 2 days ago.  He states he injects heroin into the side occasionally, most recently 1 week ago.  He has noticed some purulent drainage from his wound today.  He denies any fevers or chills.  No nausea or vomiting.  No abdominal pain.  No calf pain.  Typically injects into his arms, denies any concerns with upper extremity injection sites.  He is not currently on any antibiotics.  He reports a history of skin infection to the right foot recently which has significantly improved and has not been bothering him.  This wound was also caused by drug injection.   Foot Pain       Home Medications Prior to Admission medications   Medication Sig Start Date End Date Taking? Authorizing Provider  cloNIDine (CATAPRES) 0.1 MG tablet Take 1 tablet (0.1 mg total) by mouth 3 (three) times daily for 5 days. Patient not taking: Reported on 03/24/2019 03/08/19 03/24/19  Virgina Norfolk, DO  traZODone (DESYREL) 50 MG tablet Take 1 tablet (50 mg total) by mouth at bedtime as needed for sleep. Patient not taking: Reported on 02/21/2019 02/11/19 03/24/19  Aldean Baker, NP      Allergies    Codeine, Hydrocodone, and Tramadol    Review of Systems   Review of Systems  Skin:  Positive for color change and wound.  All other systems reviewed and are negative.   Physical Exam Updated Vital Signs BP (!) 124/96   Pulse 74   Temp 98.1 F (36.7 C) (Oral)   Resp 15   SpO2 100%  Physical Exam Vitals and nursing note reviewed.  Constitutional:      General: He is not in acute distress.    Appearance: He is  well-developed.     Comments: Disheveled appearance, resting comfortably in bed  HENT:     Head: Normocephalic and atraumatic.  Eyes:     Conjunctiva/sclera: Conjunctivae normal.  Cardiovascular:     Rate and Rhythm: Normal rate and regular rhythm.     Heart sounds: No murmur heard. Pulmonary:     Effort: Pulmonary effort is normal. No respiratory distress.     Breath sounds: Normal breath sounds.  Abdominal:     Palpations: Abdomen is soft.     Tenderness: There is no abdominal tenderness. There is no guarding.  Musculoskeletal:        General: No swelling.     Cervical back: Neck supple.       Feet:  Feet:     Comments: 2 well-circumscribed areas of necrosis to the dorsum of the left foot with moderate surrounding erythema.  Scant purulent drainage.  Good capillary refill.  DP pulses 2+ bilaterally.  Sensation intact in all digits.  Moderate swelling. Skin:    General: Skin is warm and dry.     Capillary Refill: Capillary refill takes less than 2 seconds.  Neurological:     Mental Status: He is alert.  Psychiatric:        Mood and Affect: Mood normal.     ED  Results / Procedures / Treatments   Labs (all labs ordered are listed, but only abnormal results are displayed) Labs Reviewed  CULTURE, BLOOD (ROUTINE X 2)  CULTURE, BLOOD (ROUTINE X 2)  COMPREHENSIVE METABOLIC PANEL  CBC WITH DIFFERENTIAL/PLATELET  I-STAT CG4 LACTIC ACID, ED  I-STAT CG4 LACTIC ACID, ED    EKG None  Radiology DG Foot 2 Views Left  Result Date: 08/08/2023 CLINICAL DATA:  Left foot infection. Injected heroin in the top of his foot a few weeks ago. EXAM: LEFT FOOT - 2 VIEW COMPARISON:  None Available. FINDINGS: No acute fracture or dislocation. Prominent dorsal spurring of the talar head. Joint spaces are preserved. No radiopaque foreign body identified. IMPRESSION: 1. No acute osseous abnormality or radiopaque foreign body. Electronically Signed   By: Obie Dredge M.D.   On: 08/08/2023 17:59     Procedures Procedures    Medications Ordered in ED Medications  doxycycline (VIBRAMYCIN) 100 mg in dextrose 5 % 250 mL IVPB (has no administration in time range)    ED Course/ Medical Decision Making/ A&P                                 Medical Decision Making Amount and/or Complexity of Data Reviewed Labs: ordered. Radiology: ordered.  This patient presents to the ED for concern of foot infection, this involves an extensive number of treatment options, and is a complaint that carries with it a high risk of complications and morbidity.  The differential diagnosis includes cellulitis, osteomyelitis, septic arthritis  My initial workup includes labs, imaging  Additional history obtained from: Nursing notes from this visit. ED visits for similar on 06/18/2023, 06/07/2023  I ordered, reviewed and interpreted labs which include: CBC, CMP, lactate, cultures.  Labs pending at shift change.  I ordered imaging studies including x-ray left foot I independently visualized and interpreted imaging which showed no acute osseous abnormalities, no evidence of osteomyelitis I agree with the radiologist interpretation  Afebrile, hemodynamically stable.  37 year old male presenting for evaluation of left foot wound.  Believes this comes from injecting heroin.  Has history of similar had numerous other sites.  He is not currently on any antibiotics.  He has required OR washout for similar wounds in the past.  He denies any fevers, nausea, vomiting or other systemic signs of infection.  On exam, there is erythema and swelling to the dorsum of the left foot with 2 small areas of necrosis.  DP pulse 2+ to this foot.  Erythema does not extend over the ankle joint. x-ray imaging negative for osteomyelitis.  Lab workup pending at shift change.  Disposition pending lab results.  Although patient is high risk, has been treated successfully with oral antibiotics in the past.  Care handed off to [employer],  PA-C at shift change.  Please see his note for final disposition and decision making.  Plan may change at the discretion of the oncoming provider.  Patient's case discussed with Dr. Renaye Rakers.   Note: Portions of this report may have been transcribed using voice recognition software. Every effort was made to ensure accuracy; however, inadvertent computerized transcription errors may still be present.         Final Clinical Impression(s) / ED Diagnoses Final diagnoses:  Cellulitis of left foot    Rx / DC Orders ED Discharge Orders     None         Delontae Lamm, Edsel Petrin, PA-C  08/08/23 2327    Terald Sleeper, MD 08/09/23 [phone]

## 2023-08-08 NOTE — ED Provider Triage Note (Signed)
Emergency Medicine Provider Triage Evaluation Note  Jon Castro , a 37 y.o. male  was evaluated in triage.  Pt complains of right foot wound that has been present for a week. IVDU and injected heroin into foot. Endorses purulent material. Can still feel foot. States redness and swelling is getting worse. Unsure of fever. Denies CP or SHOB. Unsure of tetanus.  Review of Systems  Positive:  Negative:   Physical Exam  BP 98/69 (BP Location: Right Arm)   Pulse 67   Temp 98.5 F (36.9 C) (Oral)   Resp 18   SpO2 100%  Gen:   Awake, no distress   Resp:  Normal effort  MSK:   Moves extremities without difficulty  Other:  Large abscess noted on right foot that is tender to palpation and able to express blood but there is dried purulent material around the two necrotic ulcers on the abscess, soft compartments, pain not out of proportion, sensation intact distally, able to wiggle toes, PT pulses intact  Medical Decision Making  Medically screening exam initiated at 2:45 PM.  Appropriate orders placed.  Tad Moore was informed that the remainder of the evaluation will be completed by another provider, this initial triage assessment does not replace that evaluation, and the importance of remaining in the ED until their evaluation is complete.  Workup started, do suspect patient maybe septic with abscess, nursing staff notified patient has necrotic tissue and concerned patient may need room. pt stable.   Netta Corrigan, PA-C 08/08/23 [phone]

## 2023-08-08 NOTE — ED Provider Notes (Signed)
Patient signed out to me at shift change.  Hx of IVDU.  Developing cellulitis of the foot thought 2/2 injecting heroin.  No evidence of gas or osteo on x-ray.    Will need admission for cellulitis.  BPs have been a little soft, but chart review shows multiple similar BPs from prior visits.   Roxy Horseman, PA-C 08/09/23 0353    Dione Booze, MD 08/09/23 [id number]    Dione Booze, MD 08/09/23 0730

## 2023-08-08 NOTE — ED Triage Notes (Signed)
Pt is a foot infection in the left foot. There is a necrotic spot on the top of the foot above the popliteal pulse site. He states a few weeks back he had injected heroin in his foot. Has not used the site since but in the last few days he has noticed it has become very swollen is now leaking purulent drainage. No other complaints at this time.

## 2023-08-08 NOTE — ED Notes (Signed)
Unable to obtain labs. Pt. IV drug user. Veins are severely scared.

## 2023-08-08 NOTE — ED Notes (Signed)
Phlebotomy team unable to collect labs

## 2023-08-08 NOTE — Progress Notes (Signed)
[facility] consult received. Patient currently in the waiting room. Notified Fulton Mole RN to re-consult [facility] when patient is in a room and on a stretcher for PIV assessment/placement. Tomasita Morrow, RN [facility]

## 2023-08-09 ENCOUNTER — Other Ambulatory Visit: Payer: Self-pay

## 2023-08-09 DIAGNOSIS — L02612 Cutaneous abscess of left foot: Secondary | ICD-10-CM

## 2023-08-09 DIAGNOSIS — L03116 Cellulitis of left lower limb: Principal | ICD-10-CM | POA: Diagnosis present

## 2023-08-09 LAB — CBC WITH DIFFERENTIAL/PLATELET
Abs Immature Granulocytes: 0.05 10*3/uL (ref 0.00–0.07)
Abs Immature Granulocytes: 0.09 10*3/uL — ABNORMAL HIGH (ref 0.00–0.07)
Basophils Absolute: 0 10*3/uL (ref 0.0–0.1)
Basophils Absolute: 0.1 10*3/uL (ref 0.0–0.1)
Basophils Relative: 0 %
Basophils Relative: 0 %
Eosinophils Absolute: 0.1 10*3/uL (ref 0.0–0.5)
Eosinophils Absolute: 0.1 10*3/uL (ref 0.0–0.5)
Eosinophils Relative: 1 %
Eosinophils Relative: 1 %
HCT: 36.8 % — ABNORMAL LOW (ref 39.0–52.0)
HCT: 36.9 % — ABNORMAL LOW (ref 39.0–52.0)
Hemoglobin: 12.2 g/dL — ABNORMAL LOW (ref 13.0–17.0)
Hemoglobin: 12.3 g/dL — ABNORMAL LOW (ref 13.0–17.0)
Immature Granulocytes: 1 %
Immature Granulocytes: 1 %
Lymphocytes Relative: 19 %
Lymphocytes Relative: 22 %
Lymphs Abs: 1.9 10*3/uL (ref 0.7–4.0)
Lymphs Abs: 3.2 10*3/uL (ref 0.7–4.0)
MCH: 27.3 pg (ref 26.0–34.0)
MCH: 27.7 pg (ref 26.0–34.0)
MCHC: 33.1 g/dL (ref 30.0–36.0)
MCHC: 33.4 g/dL (ref 30.0–36.0)
MCV: 81.8 fL (ref 80.0–100.0)
MCV: 83.9 fL (ref 80.0–100.0)
Monocytes Absolute: 0.6 10*3/uL (ref 0.1–1.0)
Monocytes Absolute: 1.4 10*3/uL — ABNORMAL HIGH (ref 0.1–1.0)
Monocytes Relative: 10 %
Monocytes Relative: 6 %
Neutro Abs: 7.7 10*3/uL (ref 1.7–7.7)
Neutro Abs: 9.8 10*3/uL — ABNORMAL HIGH (ref 1.7–7.7)
Neutrophils Relative %: 66 %
Neutrophils Relative %: 73 %
Platelets: 203 10*3/uL (ref 150–400)
Platelets: 228 10*3/uL (ref 150–400)
RBC: 4.4 MIL/uL (ref 4.22–5.81)
RBC: 4.5 MIL/uL (ref 4.22–5.81)
RDW: 14.3 % (ref 11.5–15.5)
RDW: 14.4 % (ref 11.5–15.5)
WBC: 10.4 10*3/uL (ref 4.0–10.5)
WBC: 14.6 10*3/uL — ABNORMAL HIGH (ref 4.0–10.5)
nRBC: 0 % (ref 0.0–0.2)
nRBC: 0 % (ref 0.0–0.2)

## 2023-08-09 LAB — COMPREHENSIVE METABOLIC PANEL
ALT: 31 U/L (ref 0–44)
ALT: 33 U/L (ref 0–44)
AST: 27 U/L (ref 15–41)
AST: 27 U/L (ref 15–41)
Albumin: 2.8 g/dL — ABNORMAL LOW (ref 3.5–5.0)
Albumin: 3 g/dL — ABNORMAL LOW (ref 3.5–5.0)
Alkaline Phosphatase: 53 U/L (ref 38–126)
Alkaline Phosphatase: 61 U/L (ref 38–126)
Anion gap: 11 (ref 5–15)
Anion gap: 4 — ABNORMAL LOW (ref 5–15)
BUN: 10 mg/dL (ref 6–20)
BUN: 8 mg/dL (ref 6–20)
CO2: 23 mmol/L (ref 22–32)
CO2: 24 mmol/L (ref 22–32)
Calcium: 8.6 mg/dL — ABNORMAL LOW (ref 8.9–10.3)
Calcium: 8.7 mg/dL — ABNORMAL LOW (ref 8.9–10.3)
Chloride: 103 mmol/L (ref 98–111)
Chloride: 108 mmol/L (ref 98–111)
Creatinine, Ser: 0.84 mg/dL (ref 0.61–1.24)
Creatinine, Ser: 0.95 mg/dL (ref 0.61–1.24)
GFR, Estimated: >60 mL/min (ref >60)
GFR, Estimated: >60 mL/min (ref >60)
Glucose, Bld: 109 mg/dL — ABNORMAL HIGH (ref 70–99)
Glucose, Bld: 162 mg/dL — ABNORMAL HIGH (ref 70–99)
Potassium: 3.3 mmol/L — ABNORMAL LOW (ref 3.5–5.1)
Potassium: 3.8 mmol/L (ref 3.5–5.1)
Sodium: 135 mmol/L (ref 135–145)
Sodium: 138 mmol/L (ref 135–145)
Total Bilirubin: 1.1 mg/dL (ref <1.2)
Total Bilirubin: 1.1 mg/dL (ref <1.2)
Total Protein: 6.2 g/dL — ABNORMAL LOW (ref 6.5–8.1)
Total Protein: 6.3 g/dL — ABNORMAL LOW (ref 6.5–8.1)

## 2023-08-09 LAB — RAPID URINE DRUG SCREEN, HOSP PERFORMED
Amphetamines: NOT DETECTED
Barbiturates: NOT DETECTED
Benzodiazepines: POSITIVE — AB
Cocaine: POSITIVE — AB
Opiates: NOT DETECTED
Tetrahydrocannabinol: POSITIVE — AB

## 2023-08-09 LAB — I-STAT CG4 LACTIC ACID, ED: Lactic Acid, Venous: 0.6 mmol/L (ref 0.5–1.9)

## 2023-08-09 MED ORDER — OXYCODONE-ACETAMINOPHEN 5-325 MG PO TABS
1.0000 | ORAL_TABLET | ORAL | Status: DC | PRN
  Administered 2023-08-09: 2 via ORAL
  Administered 2023-08-09: 1 via ORAL
  Administered 2023-08-10 (×3): 2 via ORAL
  Filled 2023-08-09 (×5): qty 2

## 2023-08-09 MED ORDER — ACETAMINOPHEN 325 MG PO TABS
650.0000 mg | ORAL_TABLET | Freq: Four times a day (QID) | ORAL | Status: DC | PRN
  Administered 2023-08-10: 650 mg via ORAL
  Filled 2023-08-09: qty 2

## 2023-08-09 MED ORDER — SODIUM CHLORIDE 0.9 % IV BOLUS
1000.0000 mL | Freq: Once | INTRAVENOUS | Status: AC
  Administered 2023-08-09: 1000 mL via INTRAVENOUS

## 2023-08-09 MED ORDER — NICOTINE 21 MG/24HR TD PT24
21.0000 mg | MEDICATED_PATCH | Freq: Every day | TRANSDERMAL | Status: DC
Start: 2023-08-09 — End: 2023-08-13
  Administered 2023-08-09 – 2023-08-13 (×6): 21 mg via TRANSDERMAL
  Filled 2023-08-09 (×6): qty 1

## 2023-08-09 MED ORDER — SODIUM CHLORIDE 0.9 % IV SOLN
2.0000 g | Freq: Once | INTRAVENOUS | Status: AC
  Administered 2023-08-09: 2 g via INTRAVENOUS
  Filled 2023-08-09: qty 12.5

## 2023-08-09 MED ORDER — SODIUM CHLORIDE 0.9% FLUSH
3.0000 mL | Freq: Two times a day (BID) | INTRAVENOUS | Status: DC
Start: 2023-08-09 — End: 2023-08-13
  Administered 2023-08-09 – 2023-08-12 (×7): 3 mL via INTRAVENOUS

## 2023-08-09 MED ORDER — OXYCODONE-ACETAMINOPHEN 5-325 MG PO TABS: 1.0000 | ORAL_TABLET | ORAL | Status: DC | PRN

## 2023-08-09 MED ORDER — ONDANSETRON HCL 4 MG/2ML IJ SOLN: 4.0000 mg | Freq: Four times a day (QID) | INTRAMUSCULAR | Status: DC | PRN

## 2023-08-09 MED ORDER — OXYCODONE-ACETAMINOPHEN 5-325 MG PO TABS
1.0000 | ORAL_TABLET | ORAL | Status: DC | PRN
  Administered 2023-08-09 (×2): 2 via ORAL
  Filled 2023-08-09 (×3): qty 2

## 2023-08-09 MED ORDER — ONDANSETRON HCL 4 MG PO TABS: 4.0000 mg | ORAL_TABLET | Freq: Four times a day (QID) | ORAL | Status: DC | PRN

## 2023-08-09 MED ORDER — METRONIDAZOLE 500 MG/100ML IV SOLN
500.0000 mg | Freq: Once | INTRAVENOUS | Status: AC
  Administered 2023-08-09: 500 mg via INTRAVENOUS
  Filled 2023-08-09: qty 100

## 2023-08-09 MED ORDER — OXYCODONE HCL 5 MG PO TABS
2.5000 mg | ORAL_TABLET | ORAL | Status: DC | PRN
  Administered 2023-08-09 – 2023-08-10 (×5): 2.5 mg via ORAL
  Filled 2023-08-09 (×5): qty 1

## 2023-08-09 MED ORDER — ENOXAPARIN SODIUM 40 MG/0.4ML IJ SOSY
40.0000 mg | PREFILLED_SYRINGE | INTRAMUSCULAR | Status: DC
  Filled 2023-08-09 (×2): qty 0.4

## 2023-08-09 MED ORDER — KETOROLAC TROMETHAMINE 15 MG/ML IJ SOLN
15.0000 mg | Freq: Four times a day (QID) | INTRAMUSCULAR | Status: DC
  Administered 2023-08-09 – 2023-08-10 (×4): 15 mg via INTRAVENOUS
  Filled 2023-08-09 (×5): qty 1

## 2023-08-09 MED ORDER — OXYCODONE-ACETAMINOPHEN 7.5-325 MG PO TABS: 1.0000 | ORAL_TABLET | ORAL | Status: DC | PRN

## 2023-08-09 MED ORDER — OXYCODONE HCL 5 MG PO TABS: 2.5000 mg | ORAL_TABLET | ORAL | Status: DC | PRN

## 2023-08-09 MED ORDER — SODIUM CHLORIDE 0.9 % IV SOLN
2.0000 g | Freq: Three times a day (TID) | INTRAVENOUS | Status: DC
  Administered 2023-08-09 – 2023-08-13 (×12): 2 g via INTRAVENOUS
  Filled 2023-08-09 (×13): qty 12.5

## 2023-08-09 MED ORDER — PANTOPRAZOLE SODIUM 40 MG PO TBEC
40.0000 mg | DELAYED_RELEASE_TABLET | Freq: Every day | ORAL | Status: DC
  Administered 2023-08-09 – 2023-08-13 (×5): 40 mg via ORAL
  Filled 2023-08-09 (×5): qty 1

## 2023-08-09 MED ORDER — POTASSIUM CHLORIDE CRYS ER 20 MEQ PO TBCR
40.0000 meq | EXTENDED_RELEASE_TABLET | Freq: Once | ORAL | Status: AC
  Administered 2023-08-09: 40 meq via ORAL
  Filled 2023-08-09: qty 2

## 2023-08-09 MED ORDER — VANCOMYCIN HCL 1.5 G IV SOLR
1500.0000 mg | Freq: Two times a day (BID) | INTRAVENOUS | Status: DC
  Administered 2023-08-09 – 2023-08-13 (×8): 1500 mg via INTRAVENOUS
  Filled 2023-08-09 (×8): qty 30

## 2023-08-09 MED ORDER — VANCOMYCIN HCL 1.5 G IV SOLR
1500.0000 mg | Freq: Once | INTRAVENOUS | Status: AC
  Administered 2023-08-09: 1500 mg via INTRAVENOUS
  Filled 2023-08-09: qty 30

## 2023-08-09 MED ORDER — ACETAMINOPHEN 650 MG RE SUPP: 650.0000 mg | Freq: Four times a day (QID) | RECTAL | Status: DC | PRN

## 2023-08-09 NOTE — ED Notes (Signed)
ED TO INPATIENT HANDOFF REPORT  ED Nurse Name and Phone #: Vernona Rieger [phone]  S Name/Age/Gender Jon Castro 37 y.o. male Room/Bed: 044C/044C  Code Status   Code Status: Full Code  Home/SNF/Other Home Patient oriented to: self, place, time, and situation Is this baseline? Yes   Triage Complete: Triage complete  Chief Complaint Cellulitis of left foot [L03.116]  Triage Note Pt is a foot infection in the left foot. There is a necrotic spot on the top of the foot above the popliteal pulse site. He states a few weeks back he had injected heroin in his foot. Has not used the site since but in the last few days he has noticed it has become very swollen is now leaking purulent drainage. No other complaints at this time.    Allergies Allergies  Allergen Reactions   Codeine Nausea And Vomiting   Hydrocodone Itching   Tramadol Other (See Comments)    "messes with head"    Level of Care/Admitting Diagnosis ED Disposition     ED Disposition  Admit   Condition  --   Comment  Hospital Area: [facility] [[id number]]  Level of Care: Med-Surg [16]  May place patient in observation at [facility] or [facility] if equivalent level of care is available:: Yes  Covid Evaluation: Asymptomatic - no recent exposure (last 10 days) testing not required  Diagnosis: Cellulitis of left foot [119147]  Admitting Physician: Joseph Art [[id number]  Attending Physician: Joseph Art [id number]          B Medical/Surgery History Past Medical History:  Diagnosis Date   Back pain    Cellulitis 10/13/2019   Drug abuse (HCC)    Fungemia 05/02/2019   IV drug abuse (HCC)    IVDU (intravenous drug user)    LFTs abnormal    MRSA bacteremia 04/29/2019   Overdose of heroin, accidental or unintentional, initial encounter (HCC) 05/31/2019   Suicidal ideation 10/30/2019   Past Surgical History:  Procedure Laterality Date   I & D EXTREMITY Right 04/28/2019   Procedure: IRRIGATION AND  DEBRIDEMENT ARM ABSCESS;  Surgeon: Sheral Apley, MD;  Location: [facility] OR;  Service: Orthopedics;  Laterality: Right;   I & D EXTREMITY Right 04/01/2020   Procedure: IRRIGATION AND DEBRIDEMENT OF ELBOW;  Surgeon: Bradly Bienenstock, MD;  Location: [facility] OR;  Service: Orthopedics;  Laterality: Right;   TEE WITHOUT CARDIOVERSION N/A 05/03/2019   Procedure: TRANSESOPHAGEAL ECHOCARDIOGRAM (TEE);  Surgeon: Jake Bathe, MD;  Location[facility] ENDOSCOPY;  Service: Cardiovascular;  Laterality: N/A;   TEE WITHOUT CARDIOVERSION N/A 04/14/2020   Procedure: TRANSESOPHAGEAL ECHOCARDIOGRAM (TEE);  Surgeon: Jodelle Red, MD;  Location: [facility] ENDOSCOPY;  Service: Cardiovascular;  Laterality: N/A;     A IV Location/Drains/Wounds Patient Lines/Drains/Airways Status     Active Line/Drains/Airways     Name Placement date Placement time Site Days   Peripheral IV 08/08/23 20 G 1.88" Left;Anterior Forearm 08/08/23  2259  Forearm  1   Wound / Incision (Open or Dehisced) 06/08/23 Foot Right 06/08/23  2021  Foot  62            Intake/Output Last 24 hours  Intake/Output Summary (Last 24 hours) at 08/09/2023 1218 Last data filed at 08/09/2023 0865 Gross per 24 hour  Intake 1949 ml  Output --  Net 1949 ml    Labs/Imaging Results for orders placed or performed during the hospital encounter of 08/08/23 (from the past 48 hour(s))  Culture, blood (routine x  2)     Status: None (Preliminary result)   Collection Time: 08/08/23 10:36 PM   Specimen: BLOOD LEFT FOREARM  Result Value Ref Range   Specimen Description BLOOD LEFT FOREARM    Special Requests      BOTTLES DRAWN AEROBIC AND ANAEROBIC Blood Culture adequate volume   Culture      NO GROWTH < 12 HOURS Performed at [facility], [address], [place], [place] [address]    Report Status PENDING   Comprehensive metabolic panel     Status: Abnormal   Collection Time: 08/08/23 11:52 PM  Result Value Ref Range   Sodium 138 135 - 145 mmol/L    Potassium 3.3 (L) 3.5 - 5.1 mmol/L   Chloride 103 98 - 111 mmol/L   CO2 24 22 - 32 mmol/L   Glucose, Bld 109 (H) 70 - 99 mg/dL    Comment: Glucose reference range applies only to samples taken after fasting for at least 8 hours.   BUN 10 6 - 20 mg/dL   Creatinine, Ser 8.46 0.61 - 1.24 mg/dL   Calcium 8.7 (L) 8.9 - 10.3 mg/dL   Total Protein 6.3 (L) 6.5 - 8.1 g/dL   Albumin 3.0 (L) 3.5 - 5.0 g/dL   AST 27 15 - 41 U/L   ALT 33 0 - 44 U/L   Alkaline Phosphatase 61 38 - 126 U/L   Total Bilirubin 1.1 <1.2 mg/dL   GFR, Estimated >96 >29 mL/min    Comment: (NOTE) Calculated using the CKD-EPI Creatinine Equation (2021)    Anion gap 11 5 - 15    Comment: Performed at [facility], [address], [place], [place] [address]  CBC with Differential     Status: Abnormal   Collection Time: 08/08/23 11:52 PM  Result Value Ref Range   WBC 14.6 (H) 4.0 - 10.5 K/uL   RBC 4.50 4.22 - 5.81 MIL/uL   Hemoglobin 12.3 (L) 13.0 - 17.0 g/dL   HCT 32.4 (L) 40.1 - 02.7 %   MCV 81.8 80.0 - 100.0 fL   MCH 27.3 26.0 - 34.0 pg   MCHC 33.4 30.0 - 36.0 g/dL   RDW 25.3 66.4 - 40.3 %   Platelets 228 150 - 400 K/uL   nRBC 0.0 0.0 - 0.2 %   Neutrophils Relative % 66 %   Neutro Abs 9.8 (H) 1.7 - 7.7 K/uL   Lymphocytes Relative 22 %   Lymphs Abs 3.2 0.7 - 4.0 K/uL   Monocytes Relative 10 %   Monocytes Absolute 1.4 (H) 0.1 - 1.0 K/uL   Eosinophils Relative 1 %   Eosinophils Absolute 0.1 0.0 - 0.5 K/uL   Basophils Relative 0 %   Basophils Absolute 0.1 0.0 - 0.1 K/uL   Immature Granulocytes 1 %   Abs Immature Granulocytes 0.09 (H) 0.00 - 0.07 K/uL    Comment: Performed at [facility], [address], [place], [place] [address]  I-Stat Lactic Acid, ED     Status: None   Collection Time: 08/09/23 12:07 AM  Result Value Ref Range   Lactic Acid, Venous 0.6 0.5 - 1.9 mmol/L  Comprehensive metabolic panel     Status: Abnormal   Collection Time: 08/09/23  6:56 AM  Result Value Ref Range   Sodium 135  135 - 145 mmol/L   Potassium 3.8 3.5 - 5.1 mmol/L   Chloride 108 98 - 111 mmol/L   CO2 23 22 - 32 mmol/L  Glucose, Bld 162 (H) 70 - 99 mg/dL    Comment: Glucose reference range applies only to samples taken after fasting for at least 8 hours.   BUN 8 6 - 20 mg/dL   Creatinine, Ser 5.78 0.61 - 1.24 mg/dL   Calcium 8.6 (L) 8.9 - 10.3 mg/dL   Total Protein 6.2 (L) 6.5 - 8.1 g/dL   Albumin 2.8 (L) 3.5 - 5.0 g/dL   AST 27 15 - 41 U/L   ALT 31 0 - 44 U/L   Alkaline Phosphatase 53 38 - 126 U/L   Total Bilirubin 1.1 <1.2 mg/dL   GFR, Estimated >46 >96 mL/min    Comment: (NOTE) Calculated using the CKD-EPI Creatinine Equation (2021)    Anion gap 4 (L) 5 - 15    Comment: Performed at [facility], [address], [place], [place] [address]  CBC with Differential/Platelet     Status: Abnormal   Collection Time: 08/09/23  6:56 AM  Result Value Ref Range   WBC 10.4 4.0 - 10.5 K/uL   RBC 4.40 4.22 - 5.81 MIL/uL   Hemoglobin 12.2 (L) 13.0 - 17.0 g/dL   HCT 41.3 (L) 24.4 - 01.0 %   MCV 83.9 80.0 - 100.0 fL   MCH 27.7 26.0 - 34.0 pg   MCHC 33.1 30.0 - 36.0 g/dL   RDW 27.2 53.6 - 64.4 %   Platelets 203 150 - 400 K/uL   nRBC 0.0 0.0 - 0.2 %   Neutrophils Relative % 73 %   Neutro Abs 7.7 1.7 - 7.7 K/uL   Lymphocytes Relative 19 %   Lymphs Abs 1.9 0.7 - 4.0 K/uL   Monocytes Relative 6 %   Monocytes Absolute 0.6 0.1 - 1.0 K/uL   Eosinophils Relative 1 %   Eosinophils Absolute 0.1 0.0 - 0.5 K/uL   Basophils Relative 0 %   Basophils Absolute 0.0 0.0 - 0.1 K/uL   Immature Granulocytes 1 %   Abs Immature Granulocytes 0.05 0.00 - 0.07 K/uL    Comment: Performed at [facility], [address], [place], [place] [address]   DG Foot 2 Views Left  Result Date: 08/08/2023 CLINICAL DATA:  Left foot infection. Injected heroin in the top of his foot a few weeks ago. EXAM: LEFT FOOT - 2 VIEW COMPARISON:  None Available. FINDINGS: No acute fracture or dislocation. Prominent dorsal spurring  of the talar head. Joint spaces are preserved. No radiopaque foreign body identified. IMPRESSION: 1. No acute osseous abnormality or radiopaque foreign body. Electronically Signed   By: Obie Dredge M.D.   On: 08/08/2023 17:59    Pending Labs Unresulted Labs (From admission, onward)     Start     Ordered   08/16/23 0500  Creatinine, serum  (enoxaparin (LOVENOX)    CrCl >/= 30 ml/min)  Weekly,   R     Comments: while on enoxaparin therapy   Question:  Specimen collection method  Answer:  IV Team=IV Team collect   08/09/23 1020   08/10/23 0500  Basic metabolic panel  Tomorrow morning,   R       Question:  Specimen collection method  Answer:  IV Team=IV Team collect   08/09/23 1020   08/10/23 0500  CBC  Tomorrow morning,   R       Question:  Specimen collection method  Answer:  IV Team=IV Team collect   08/09/23 1020   08/09/23 1020  CBC  (enoxaparin (LOVENOX)  CrCl >/= 30 ml/min)  Once,   R       Comments: Baseline for enoxaparin therapy IF NOT ALREADY DRAWN.  Notify MD if PLT < 100 K.   Question:  Specimen collection method  Answer:  IV Team=IV Team collect   08/09/23 1020   08/09/23 1020  Creatinine, serum  (enoxaparin (LOVENOX)    CrCl >/= 30 ml/min)  Once,   R       Comments: Baseline for enoxaparin therapy IF NOT ALREADY DRAWN.   Question:  Specimen collection method  Answer:  IV Team=IV Team collect   08/09/23 1020   08/09/23 0655  Rapid urine drug screen (hospital performed)  ONCE - STAT,   STAT        08/09/23 0654   08/08/23 1446  Culture, blood (routine x 2)  BLOOD CULTURE X 2,   R (with STAT occurrences)      08/08/23 1445            Vitals/Pain Today's Vitals   08/09/23 0709 08/09/23 1011 08/09/23 1114 08/09/23 1125  BP:    (!) 104/57  Pulse:    66  Resp:    20  Temp: 98.2 F (36.8 C)   98.9 F (37.2 C)  TempSrc: Oral   Oral  SpO2:    100%  Weight:      Height:      PainSc:  7  4      Isolation Precautions No active  isolations  Medications Medications  ketorolac (TORADOL) 15 MG/ML injection 15 mg (15 mg Intravenous Given 08/09/23 1013)  pantoprazole (PROTONIX) EC tablet 40 mg (40 mg Oral Given 08/09/23 1012)  Vancomycin (VANCOCIN) 1,500 mg in sodium chloride 0.9 % 500 mL IVPB (has no administration in time range)  oxyCODONE-acetaminophen (PERCOCET/ROXICET) 5-325 MG per tablet 1-2 tablet (2 tablets Oral Given 08/09/23 1013)    And  oxyCODONE (Oxy IR/ROXICODONE) immediate release tablet 2.5 mg (has no administration in time range)  ceFEPIme (MAXIPIME) 2 g in sodium chloride 0.9 % 100 mL IVPB (has no administration in time range)  enoxaparin (LOVENOX) injection 40 mg (has no administration in time range)  sodium chloride flush (NS) 0.9 % injection 3 mL (3 mLs Intravenous Given 08/09/23 1113)  acetaminophen (TYLENOL) tablet 650 mg (has no administration in time range)    Or  acetaminophen (TYLENOL) suppository 650 mg (has no administration in time range)  ondansetron (ZOFRAN) tablet 4 mg (has no administration in time range)    Or  ondansetron (ZOFRAN) injection 4 mg (has no administration in time range)  doxycycline (VIBRAMYCIN) 100 mg in dextrose 5 % 250 mL IVPB (0 mg Intravenous Stopped 08/09/23 0213)  sodium chloride 0.9 % bolus 1,000 mL (0 mLs Intravenous Stopped 08/09/23 0322)  metroNIDAZOLE (FLAGYL) IVPB 500 mg (0 mg Intravenous Stopped 08/09/23 0549)  Vancomycin (VANCOCIN) 1,500 mg in sodium chloride 0.9 % 500 mL IVPB (0 mg Intravenous Stopped 08/09/23 0921)  ceFEPIme (MAXIPIME) 2 g in sodium chloride 0.9 % 100 mL IVPB (0 g Intravenous Stopped 08/09/23 0442)  potassium chloride SA (KLOR-CON M) CR tablet 40 mEq (40 mEq Oral Given 08/09/23 1012)    Mobility walks     Focused Assessments L ft infection   R Recommendations: See Admitting Provider Note  Report given to:   Additional Notes: IV drug user.

## 2023-08-09 NOTE — Progress Notes (Signed)
Pharmacy Antibiotic Note  Kaylyn Tafur is a 37 y.o. male admitted on 08/08/2023 with wound infection.  Pharmacy has been consulted for cefepime dosing.  Plan: Cefepime 2g IV q 8h Monitor renal function, Cx and clinical progression to narrow  Height: 6\' 1"  (185.4 cm) Weight: 79 kg (174 lb 2.6 oz) IBW/kg (Calculated) : 79.9  Temp (24hrs), Avg:98.3 F (36.8 C), Min:97.7 F (36.5 C), Max:98.9 F (37.2 C)  Recent Labs  Lab 08/08/23 2352 08/09/23 0007  WBC 14.6*  --   CREATININE 0.95  --   LATICACIDVEN  --  0.6    Estimated Creatinine Clearance: 117.8 mL/min (by C-G formula based on SCr of 0.95 mg/dL).    Allergies  Allergen Reactions   Codeine Nausea And Vomiting   Hydrocodone Itching   Tramadol Other (See Comments)    "messes with head"    Daylene Posey, PharmD, [facility] Clinical Pharmacist ED Pharmacist Phone # [phone] 08/09/2023 9:32 AM

## 2023-08-09 NOTE — Progress Notes (Signed)
NEW ADMISSION NOTE New Admission Note:   Arrival Method: Patient arrived from ED  Mental Orientation: alert and oriented x 4. Telemetry: N/A Assessment: Completed Skin: warm and dry except for cellulitis of L foot. IV: Left AC SL Pain: Denies any pain, Tubes: N/A Safety Measures: Safety Fall Prevention Plan has been given, discussed and implemented. Admission: Completed [facility] Orientation: Patient has been orientated to the room, unit and staff.  Family: None  Orders have been reviewed and implemented. Will continue to monitor the patient. Call light has been placed within reach and bed alarm has been activated.   Arvilla Meres, RN

## 2023-08-09 NOTE — Progress Notes (Signed)
Pharmacy Antibiotic Note  Jon Castro is a 37 y.o. male with h/o IVDU admitted on 08/08/2023 with LLE cellulitis.  Pharmacy has been consulted for Vancomycin dosing.  Plan: Vancomycin 1500 mg IV q12h   Height: 6\' 1"  (185.4 cm) Weight: 79 kg (174 lb 2.6 oz) IBW/kg (Calculated) : 79.9  Temp (24hrs), Avg:98.3 F (36.8 C), Min:97.7 F (36.5 C), Max:98.9 F (37.2 C)  Recent Labs  Lab 08/08/23 2352 08/09/23 0007  WBC 14.6*  --   CREATININE 0.95  --   LATICACIDVEN  --  0.6    Estimated Creatinine Clearance: 117.8 mL/min (by C-G formula based on SCr of 0.95 mg/dL).    Allergies  Allergen Reactions   Codeine Nausea And Vomiting   Hydrocodone Itching   Tramadol Other (See Comments)    "messes with head"     Eddie Candle 08/09/2023 7:01 AM

## 2023-08-09 NOTE — H&P (Signed)
History and Physical    Patient: Jon Castro UUV:[id number] DOB: [birth year] DOA: 08/08/2023 DOS: the patient was seen and examined on 08/09/2023 PCP: Patient, [name] Per  Patient coming from: Home Medical readiness/disposition: Anticipate ready for discharge on 12/14 pending additional workup.  Will discharge back home  Chief Complaint:  Chief Complaint  Patient presents with   Foot Pain   HPI: Jon Castro is a 37 y.o. male with medical history significant of major depressive disorder, chronic hepatitis, IV drug abuse in context of polysubstance abuse.  Also has a history of MSSA bacteremia and tricuspid valve endocarditis in 2021.  This patient has a history of multiple issues with skin wounds from utilizing IV and/or skin popping heroin.  He was last admitted on 10/10 for left foot infection ulceration but left AMA because he was unable to obtain IV narcotics noting he specifically requested Dilaudid.  He returned to the ED 1 time since then on 10/20 but was stable enough to be discharged home on doxycycline.  He returned to the ER on 12/10 due to continued issues with with purulent drainage.  His initial white count was 14,600, differential within normal limits.  Lactic acid normal.  Patient has been given IV vancomycin, Flagyl, Vibramycin and Maxipime while in the ER.  Hospitalist service has been asked to evaluate the patient for admission.  Upon my evaluation this patient he confirmed the above history.  He admitted to using heroin last use 24 hours prior.  A urine drug screen has been obtained that was positive for benzodiazepines, cocaine and THC.  Patient is having significant discomfort in the left foot with notable thick purulent drainage oozing out from a hole in the eschar.  Pulse present.  Review of Systems: As mentioned in the history of present illness. All other systems reviewed and are negative. Past Medical History:  Diagnosis Date   Back pain    Cellulitis  10/13/2019   Drug abuse (HCC)    Fungemia 05/02/2019   IV drug abuse (HCC)    IVDU (intravenous drug user)    LFTs abnormal    MRSA bacteremia 04/29/2019   Overdose of heroin, accidental or unintentional, initial encounter (HCC) 05/31/2019   Suicidal ideation 10/30/2019   Past Surgical History:  Procedure Laterality Date   I & D EXTREMITY Right 04/28/2019   Procedure: IRRIGATION AND DEBRIDEMENT ARM ABSCESS;  Surgeon: Sheral Apley, MD;  Location: [facility] OR;  Service: Orthopedics;  Laterality: Right;   I & D EXTREMITY Right 04/01/2020   Procedure: IRRIGATION AND DEBRIDEMENT OF ELBOW;  Surgeon: Bradly Bienenstock, MD;  Location: [facility] OR;  Service: Orthopedics;  Laterality: Right;   TEE WITHOUT CARDIOVERSION N/A 05/03/2019   Procedure: TRANSESOPHAGEAL ECHOCARDIOGRAM (TEE);  Surgeon: Jake Bathe, MD;  Location: [facility] ENDOSCOPY;  Service: Cardiovascular;  Laterality: N/A;   TEE WITHOUT CARDIOVERSION N/A 04/14/2020   Procedure: TRANSESOPHAGEAL ECHOCARDIOGRAM (TEE);  Surgeon: Jodelle Red, MD;  Location: [facility] ENDOSCOPY;  Service: Cardiovascular;  Laterality: N/A;   Social History:  reports that he has been smoking cigarettes. He has never used smokeless tobacco. He reports current alcohol use. He reports current drug use. Drugs: IV, Cocaine, Amphetamines, Marijuana, Heroin, and Methamphetamines.  Allergies  Allergen Reactions   Codeine Nausea And Vomiting   Hydrocodone Itching   Tramadol Other (See Comments)    "messes with head"    History reviewed. No pertinent family history.  Prior to Admission medications   Medication Sig Start Date End Date Taking? Authorizing  Provider  cloNIDine (CATAPRES) 0.1 MG tablet Take 1 tablet (0.1 mg total) by mouth 3 (three) times daily for 5 days. Patient not taking: Reported on 03/24/2019 03/08/19 03/24/19  Virgina Norfolk, DO  traZODone (DESYREL) 50 MG tablet Take 1 tablet (50 mg total) by mouth at bedtime as needed for sleep. Patient not taking: Reported on  02/21/2019 02/11/19 03/24/19  Aldean Baker, NP    Physical Exam: Vitals:   08/09/23 0300 08/09/23 0400 08/09/23 0530 08/09/23 0709  BP: 98/63 94/67 101/65   Pulse: 61 62 61   Resp:      Temp:    98.2 F (36.8 C)  TempSrc:    Oral  SpO2: 100% 100% 100%   Weight:      Height:       Constitutional: NAD, calm, uncomfortable Respiratory: clear to auscultation bilaterally, no wheezing, no crackles. Normal respiratory effort. No accessory muscle use. RA Cardiovascular: Regular rate and rhythm, no murmurs / rubs / gallops. No extremity edema. 2+ pedal pulses.  Abdomen: no tenderness, no masses palpated. No hepatosplenomegaly. Bowel sounds positive.  Musculoskeletal: no clubbing / cyanosis. No joint deformity upper and lower extremities. Good ROM, no contractures. Normal muscle tone.  Skin: Unremarkable except for erythema and swelling to left foot with associated eschar and purulent drainage. Neurologic: CN 2-12 grossly intact. Sensation intact, Strength 5/5 x all 4 extremities.  Psychiatric: Normal judgment and insight. Alert and oriented x 3. Normal mood.     Data Reviewed:  As per HPI  Assessment and Plan: Cellulitis with likely abscess left foot Wound has been ongoing apparently since October Current wound appears to have underlying abscess noting purulent drainage Orthopedic service has been consulted Continue IV vancomycin and Maxipime Will allow p.o. Percocet for pain along with scheduled IV Toradol plus PPI Blood cultures have been obtained   IV drug abuse/polysubstance abuse Patient admits to daily use of heroin with with last use 24 hours prior to presentation Urine drug screen was positive for benzodiazepines, cocaine and THC Monitor for withdrawal symptoms-uncertain if Percocet will help assist with withdrawal symptoms-need to transition to methadone if remains in the hospital for an extended.  History of tricuspid valve endocarditis/MSSA bacteremia Cardiac exam  unremarkable Follow-up on blood cultures  History of chronic hepatitis C Current LFTs are unremarkable   Advance Care Planning:   Code Status: Full Code   VTE prophylaxis: Lovenox  Consults: Orthopedics  Family Communication: Patient only  Severity of Illness: The appropriate patient status for this patient is OBSERVATION. Observation status is judged to be reasonable and necessary in order to provide the required intensity of service to ensure the patient's safety. The patient's presenting symptoms, physical exam findings, and initial radiographic and laboratory data in the context of their medical condition is felt to place them at decreased risk for further clinical deterioration. Furthermore, it is anticipated that the patient will be medically stable for discharge from the hospital within 2 midnights of admission.   Author: Junious Silk, NP 08/09/2023 11:09 AM  For on call review [email or web address].

## 2023-08-09 NOTE — ED Notes (Signed)
Patient able to ambulate without assistance.

## 2023-08-09 NOTE — Progress Notes (Signed)
ED Pharmacy Antibiotic Sign Off An antibiotic consult was received from an ED provider for Vancomycin and Cefepime  per pharmacy dosing for cellulitis. A chart review was completed to assess appropriateness.   The following one time order(s) were placed:  Vancomycin 1500 mg IV Cefepime 2 g IV  Further antibiotic and/or antibiotic pharmacy consults should be ordered by the admitting provider if indicated.   Thank you for allowing pharmacy to be a part of this patient's care.   Eddie Candle, [facility]  Clinical Pharmacist 08/09/23 6:23 AM

## 2023-08-09 NOTE — ED Notes (Signed)
Pt refused to have second blood cultures drawn via blood draw

## 2023-08-09 NOTE — ED Notes (Signed)
Pt explained that since he is a IV drug user, it is very hard to draw blood. He didn't mean to insult the ED phlebotomist and wanted it noted that he was just trying to explain the best possible way to draw his blood and wasn't refusing.

## 2023-08-09 NOTE — Consult Note (Signed)
ORTHOPAEDIC CONSULTATION  REQUESTING PHYSICIAN: Joseph Art, DO  Chief Complaint: Draining abscess dorsum left foot.  HPI: Jon Castro is a 37 y.o. male who presents with draining abscess dorsum of left foot.  Patient has had an abscess on both feet the right foot abscess has completely healed.  Patient complains of pain and purulent drainage from the dorsum of the left foot.  Past Medical History:  Diagnosis Date   Back pain    Cellulitis 10/13/2019   Drug abuse (HCC)    Fungemia 05/02/2019   IV drug abuse (HCC)    IVDU (intravenous drug user)    LFTs abnormal    MRSA bacteremia 04/29/2019   Overdose of heroin, accidental or unintentional, initial encounter (HCC) 05/31/2019   Suicidal ideation 10/30/2019   Past Surgical History:  Procedure Laterality Date   I & D EXTREMITY Right 04/28/2019   Procedure: IRRIGATION AND DEBRIDEMENT ARM ABSCESS;  Surgeon: Sheral Apley, MD;  Location: [facility] OR;  Service: Orthopedics;  Laterality: Right;   I & D EXTREMITY Right 04/01/2020   Procedure: IRRIGATION AND DEBRIDEMENT OF ELBOW;  Surgeon: Bradly Bienenstock, MD;  Location: [facility] OR;  Service: Orthopedics;  Laterality: Right;   TEE WITHOUT CARDIOVERSION N/A 05/03/2019   Procedure: TRANSESOPHAGEAL ECHOCARDIOGRAM (TEE);  Surgeon: Jake Bathe, MD;  Location: [facility] ENDOSCOPY;  Service: Cardiovascular;  Laterality: N/A;   TEE WITHOUT CARDIOVERSION N/A 04/14/2020   Procedure: TRANSESOPHAGEAL ECHOCARDIOGRAM (TEE);  Surgeon: Jodelle Red, MD;  Location: [facility] ENDOSCOPY;  Service: Cardiovascular;  Laterality: N/A;   Social History   Socioeconomic History   Marital status: Single    Spouse name: Not on file   Number of children: Not on file   Years of education: Not on file   Highest education level: Not on file  Occupational History   Not on file  Tobacco Use   Smoking status: Every Day    Current packs/day: 0.50    Types: Cigarettes   Smokeless tobacco: Never  Vaping Use   Vaping  status: Never Used  Substance and Sexual Activity   Alcohol use: Yes   Drug use: Yes    Types: IV, Cocaine, Amphetamines, Marijuana, Heroin, Methamphetamines    Comment: HEROIN   Sexual activity: Not on file  Other Topics Concern   Not on file  Social History Narrative   ** Merged History Encounter **       Social Determinants of Health   Financial Resource Strain: Not on file  Food Insecurity: No Food Insecurity (06/08/2023)   Hunger Vital Sign    Worried About Running Out of Food in the Last Year: Never true    Ran Out of Food in the Last Year: Never true  Transportation Needs: No Transportation Needs (06/08/2023)   PRAPARE - Administrator, Civil Service (Medical): No    Lack of Transportation (Non-Medical): No  Physical Activity: Not on file  Stress: Not on file  Social Connections: Not on file   History reviewed. No pertinent family history. - negative except otherwise stated in the family history section Allergies  Allergen Reactions   Codeine Nausea And Vomiting   Hydrocodone Itching   Tramadol Other (See Comments)    "messes with head"   Prior to Admission medications   Medication Sig Start Date End Date Taking? Authorizing Provider  cloNIDine (CATAPRES) 0.1 MG tablet Take 1 tablet (0.1 mg total) by mouth 3 (three) times daily for 5 days. Patient not taking: Reported on 03/24/2019  03/08/19 03/24/19  Curatolo, Adam, DO  traZODone (DESYREL) 50 MG tablet Take 1 tablet (50 mg total) by mouth at bedtime as needed for sleep. Patient not taking: Reported on 02/21/2019 02/11/19 03/24/19  Aldean Baker, NP   DG Foot 2 Views Left  Result Date: 08/08/2023 CLINICAL DATA:  Left foot infection. Injected heroin in the top of his foot a few weeks ago. EXAM: LEFT FOOT - 2 VIEW COMPARISON:  None Available. FINDINGS: No acute fracture or dislocation. Prominent dorsal spurring of the talar head. Joint spaces are preserved. No radiopaque foreign body identified. IMPRESSION: 1.  No acute osseous abnormality or radiopaque foreign body. Electronically Signed   By: Obie Dredge M.D.   On: 08/08/2023 17:59   - pertinent xrays, CT, MRI studies were reviewed and independently interpreted  Positive ROS: All other systems have been reviewed and were otherwise negative with the exception of those mentioned in the HPI and as above.  Physical Exam: General: Alert, no acute distress Psychiatric: Patient is competent for consent with normal mood and affect Lymphatic: No axillary or cervical lymphadenopathy Cardiovascular: No pedal edema Respiratory: No cyanosis, no use of accessory musculature GI: No organomegaly, abdomen is soft and non-tender    Images:  @ENCIMAGES @  Labs:  Lab Results  Component Value Date   HGBA1C 5.5 06/08/2023   HGBA1C 5.8 (H) 04/29/2019   ESRSEDRATE 46 (H) 06/08/2023   CRP 19.9 (H) 06/08/2023   REPTSTATUS PENDING 08/08/2023   GRAMSTAIN  04/01/2020    RARE WBC PRESENT, PREDOMINANTLY PMN FEW GRAM POSITIVE COCCI    CULT  08/08/2023    NO GROWTH < 12 HOURS Performed at [facility], [address], [place], [place] [address]    LABORGA PSEUDOMONAS AERUGINOSA 04/14/2020   LABORGA ENTEROBACTER CLOACAE 04/14/2020   LABORGA ACINETOBACTER CALCOACETICUS/BAUMANNII COMPLEX 04/14/2020    Lab Results  Component Value Date   ALBUMIN 2.8 (L) 08/09/2023   ALBUMIN 3.0 (L) 08/08/2023   ALBUMIN 3.2 (L) 06/18/2023   PREALBUMIN <5 (L) 06/08/2023        Latest Ref Rng & Units 08/09/2023    6:56 AM 08/08/2023   11:52 PM 06/18/2023    9:58 AM  CBC EXTENDED  WBC 4.0 - 10.5 K/uL 10.4  14.6  10.0   RBC 4.22 - 5.81 MIL/uL 4.40  4.50  4.91   Hemoglobin 13.0 - 17.0 g/dL 25.3  66.4  40.3   HCT 39.0 - 52.0 % 36.9  36.8  41.3   Platelets 150 - 400 K/uL 203  228  363   NEUT# 1.7 - 7.7 K/uL 7.7  9.8  6.0   Lymph# 0.7 - 4.0 K/uL 1.9  3.2  2.7     Neurologic: Patient does not have protective sensation bilateral lower  extremities.   MUSCULOSKELETAL:   Skin: Examination there is a small linear eschar on the dorsum of the left foot that is 1 cm x 4 cm.  There is a small amount of purulent drainage from the superficial ulcer.  There is surrounding cellulitis and it is tender to palpation.  On the right foot the dorsal lateral ulcer has completely healed with complete epithelialization.  Patient has a strong palpable dorsalis pedis pulse bilaterally.  Review of the radiographs shows no air in the soft tissue no destructive bony changes.  White cell count 10.4, albumin 2.8.  Assessment: Assessment: Superficial abscess dorsum of the left foot.  Plan: Plan for IV antibiotics.  I will place an order for  the wound to be covered with a Vashe soaked gauze and change daily.  Thank you for the consult and the opportunity to see Mr. Jon Bukoski, MD [facility] [phone] 1:20 PM

## 2023-08-10 DIAGNOSIS — L03116 Cellulitis of left lower limb: Secondary | ICD-10-CM | POA: Diagnosis not present

## 2023-08-10 LAB — MRSA NEXT GEN BY PCR, NASAL: MRSA by PCR Next Gen: DETECTED — AB

## 2023-08-10 LAB — BASIC METABOLIC PANEL
Anion gap: 6 (ref 5–15)
BUN: 9 mg/dL (ref 6–20)
CO2: 22 mmol/L (ref 22–32)
Calcium: 8.6 mg/dL — ABNORMAL LOW (ref 8.9–10.3)
Chloride: 114 mmol/L — ABNORMAL HIGH (ref 98–111)
Creatinine, Ser: 1.09 mg/dL (ref 0.61–1.24)
GFR, Estimated: >60 mL/min (ref >60)
Glucose, Bld: 98 mg/dL (ref 70–99)
Potassium: 3.8 mmol/L (ref 3.5–5.1)
Sodium: 141 mmol/L (ref 135–145)

## 2023-08-10 LAB — CBC
HCT: 38.9 % — ABNORMAL LOW (ref 39.0–52.0)
Hemoglobin: 13.2 g/dL (ref 13.0–17.0)
MCH: 28.1 pg (ref 26.0–34.0)
MCHC: 33.9 g/dL (ref 30.0–36.0)
MCV: 82.8 fL (ref 80.0–100.0)
Platelets: 211 10*3/uL (ref 150–400)
RBC: 4.7 MIL/uL (ref 4.22–5.81)
RDW: 14.6 % (ref 11.5–15.5)
WBC: 8.3 10*3/uL (ref 4.0–10.5)
nRBC: 0 % (ref 0.0–0.2)

## 2023-08-10 MED ORDER — OXYCODONE HCL 5 MG PO TABS
15.0000 mg | ORAL_TABLET | Freq: Four times a day (QID) | ORAL | Status: DC | PRN
  Administered 2023-08-10 – 2023-08-13 (×12): 15 mg via ORAL
  Filled 2023-08-10 (×12): qty 3

## 2023-08-10 MED ORDER — CHLORHEXIDINE GLUCONATE CLOTH 2 % EX PADS: 6.0000 | MEDICATED_PAD | Freq: Every day | CUTANEOUS | Status: DC

## 2023-08-10 MED ORDER — KETOROLAC TROMETHAMINE 30 MG/ML IJ SOLN
30.0000 mg | Freq: Four times a day (QID) | INTRAMUSCULAR | Status: DC
  Administered 2023-08-10 – 2023-08-12 (×7): 30 mg via INTRAVENOUS
  Filled 2023-08-10 (×7): qty 1

## 2023-08-10 MED ORDER — ZOLPIDEM TARTRATE 5 MG PO TABS
5.0000 mg | ORAL_TABLET | Freq: Every evening | ORAL | Status: AC | PRN
Start: 2023-08-10 — End: 2023-08-11
  Administered 2023-08-11: 5 mg via ORAL
  Filled 2023-08-10: qty 1

## 2023-08-10 MED ORDER — MUPIROCIN 2 % EX OINT
1.0000 | TOPICAL_OINTMENT | Freq: Two times a day (BID) | CUTANEOUS | Status: DC
  Administered 2023-08-10 – 2023-08-13 (×7): 1 via NASAL
  Filled 2023-08-10: qty 22

## 2023-08-10 NOTE — Progress Notes (Signed)
PROGRESS NOTE    Jon Castro  ZDG:[id number] DOB: [birth year] DOA: 08/08/2023 PCP: Patient, [name] Per    Brief Narrative:   Jon Castro is a 37 y.o. male with medical history significant of major depressive disorder, chronic hepatitis, IV drug abuse in context of polysubstance abuse.  Also has a history of MSSA bacteremia and tricuspid valve endocarditis in 2021.  This patient has a history of multiple issues with skin wounds from utilizing IV and/or skin popping heroin.  He was last admitted on 10/10 for left foot infection ulceration but left AMA because he was unable to obtain IV narcotics noting he specifically requested Dilaudid.  He returned to the ED 1 time since then on 10/20 but was stable enough to be discharged home on doxycycline.  He returned to the ER on 12/10 due to continued issues with with purulent drainage.    Assessment and Plan: Cellulitis with likely abscess left foot Orthopedic service has been consulted- continue IV abx Will allow p.o. Percocet for pain along with scheduled IV Toradol plus PPI Blood cultures have been obtained  IV drug abuse/polysubstance abuse Patient admits to daily use of heroin with with last use 24 hours prior to presentation Urine drug screen was positive for benzodiazepines, cocaine and THC   History of tricuspid valve endocarditis/MSSA bacteremia Cardiac exam unremarkable Follow-up on blood cultures   History of chronic hepatitis C Current LFTs are unremarkable     DVT prophylaxis: enoxaparin (LOVENOX) injection 40 mg Start: 08/09/23 1200    Code Status: Full Code   Disposition Plan:  Level of care: Med-Surg Status is: Observation The patient will require care spanning > 2 midnights and should be moved to inpatient    Consultants:  ortho   Subjective: Continues to c/o pain  Objective: Vitals:   08/09/23 1613 08/09/23 1958 08/10/23 0409 08/10/23 0913  BP: 127/75 121/70 127/84 125/79  Pulse: 76 74 63  67  Resp: 18 20 20 18   Temp: 99.2 F (37.3 C) 99 F (37.2 C) 98.3 F (36.8 C) 98.8 F (37.1 C)  TempSrc:      SpO2: 100% 99% 100% 99%  Weight:      Height:        Intake/Output Summary (Last 24 hours) at 08/10/2023 1233 Last data filed at 08/10/2023 9518 Gross per 24 hour  Intake 300 ml  Output 0 ml  Net 300 ml   Filed Weights   08/09/23 0159  Weight: 79 kg    Examination:   General: Appearance:    Older than stated age male in no acute distress     Lungs:     Clear to auscultation bilaterally, respirations unlabored  Heart:    Normal heart rate. Normal rhythm. No murmurs, rubs, or gallops.    MS:   All extremities are intact.    Neurologic:   Awake, alert, oriented x 3. No apparent focal neurological           defect.        Data Reviewed: I have personally reviewed following labs and imaging studies  CBC: Recent Labs  Lab 08/08/23 2352 08/09/23 0656  WBC 14.6* 10.4  NEUTROABS 9.8* 7.7  HGB 12.3* 12.2*  HCT 36.8* 36.9*  MCV 81.8 83.9  PLT 228 203   Basic Metabolic Panel: Recent Labs  Lab 08/08/23 2352 08/09/23 0656  NA 138 135  K 3.3* 3.8  CL 103 108  CO2 24 23  GLUCOSE 109* 162*  BUN 10  8  CREATININE 0.95 0.84  CALCIUM 8.7* 8.6*   GFR: Estimated Creatinine Clearance: 133.2 mL/min (by C-G formula based on SCr of 0.84 mg/dL). Liver Function Tests: Recent Labs  Lab 08/08/23 2352 08/09/23 0656  AST 27 27  ALT 33 31  ALKPHOS 61 53  BILITOT 1.1 1.1  PROT 6.3* 6.2*  ALBUMIN 3.0* 2.8*   No results for input(s): "LIPASE", "AMYLASE" in the last 168 hours. No results for input(s): "AMMONIA" in the last 168 hours. Coagulation Profile: No results for input(s): "INR", "PROTIME" in the last 168 hours. Cardiac Enzymes: No results for input(s): "CKTOTAL", "CKMB", "CKMBINDEX", "TROPONINI" in the last 168 hours. BNP (last 3 results) No results for input(s): "PROBNP" in the last 8760 hours. HbA1C: No results for input(s): "HGBA1C" in the last  72 hours. CBG: No results for input(s): "GLUCAP" in the last 168 hours. Lipid Profile: No results for input(s): "CHOL", "HDL", "LDLCALC", "TRIG", "CHOLHDL", "LDLDIRECT" in the last 72 hours. Thyroid Function Tests: No results for input(s): "TSH", "T4TOTAL", "FREET4", "T3FREE", "THYROIDAB" in the last 72 hours. Anemia Panel: No results for input(s): "VITAMINB12", "FOLATE", "FERRITIN", "TIBC", "IRON", "RETICCTPCT" in the last 72 hours. Sepsis Labs: Recent Labs  Lab 08/09/23 0007  LATICACIDVEN 0.6    Recent Results (from the past 240 hours)  Culture, blood (routine x 2)     Status: None (Preliminary result)   Collection Time: 08/08/23 10:36 PM   Specimen: BLOOD LEFT FOREARM  Result Value Ref Range Status   Specimen Description BLOOD LEFT FOREARM  Final   Special Requests   Final    BOTTLES DRAWN AEROBIC AND ANAEROBIC Blood Culture adequate volume   Culture   Final    NO GROWTH 1 DAY Performed at [facility], [address] [place], [place] [address]    Report Status PENDING  Incomplete  MRSA Next Gen by PCR, Nasal     Status: Abnormal   Collection Time: 08/10/23  3:53 AM   Specimen: Nasal Mucosa; Nasal Swab  Result Value Ref Range Status   MRSA by PCR Next Gen DETECTED (A) NOT DETECTED Final    Comment: RESULT CALLED TO, READ BACK BY AND VERIFIED WITH: DOLAN RN 12/122024 @ 0606 BY AB (NOTE) The GeneXpert MRSA Assay (FDA approved for NASAL specimens only), is one component of a comprehensive MRSA colonization surveillance program. It is not intended to diagnose MRSA infection nor to guide or monitor treatment for MRSA infections. Test performance is not FDA approved in patients less than 70 years old. Performed at [facility], [address] [place], [place] [address]          Radiology Studies: DG Foot 2 Views Left Result Date: 08/08/2023 CLINICAL DATA:  Left foot infection. Injected heroin in the top of his foot a few weeks ago. EXAM: LEFT FOOT - 2  VIEW COMPARISON:  None Available. FINDINGS: No acute fracture or dislocation. Prominent dorsal spurring of the talar head. Joint spaces are preserved. No radiopaque foreign body identified. IMPRESSION: 1. No acute osseous abnormality or radiopaque foreign body. Electronically Signed   By: Obie Dredge M.D.   On: 08/08/2023 17:59        Scheduled Meds:  Chlorhexidine Gluconate Cloth  6 each Topical Q0600   enoxaparin (LOVENOX) injection  40 mg Subcutaneous Q24H   ketorolac  30 mg Intravenous Q6H   mupirocin ointment  1 Application Nasal BID   nicotine  21 mg Transdermal Daily   pantoprazole  40 mg Oral  Daily   sodium chloride flush  3 mL Intravenous Q12H   Continuous Infusions:  ceFEPime (MAXIPIME) IV 2 g (08/10/23 1207)   vancomycin 1,500 mg (08/10/23 0630)     LOS: 0 days    Time spent: 45 minutes spent on chart review, discussion with nursing staff, consultants, updating family and interview/physical exam; more than 50% of that time was spent in counseling and/or coordination of care.    Joseph Art, DO [facility] Available via Epic secure chat 7am-7pm After these hours, please refer to coverage provider listed on [email or web address] 08/10/2023, 12:33 PM

## 2023-08-10 NOTE — Plan of Care (Signed)
  Problem: Education: Goal: Knowledge of General Education information will improve Description: Including pain rating scale, medication(s)/side effects and non-pharmacologic comfort measures Outcome: Progressing   Problem: Health Behavior/Discharge Planning: Goal: Ability to manage health-related needs will improve Outcome: Progressing   Problem: Clinical Measurements: Goal: Ability to maintain clinical measurements within normal limits will improve Outcome: Progressing Goal: Diagnostic test results will improve Outcome: Progressing Goal: Respiratory complications will improve Outcome: Progressing Goal: Cardiovascular complication will be avoided Outcome: Progressing   Problem: Activity: Goal: Risk for activity intolerance will decrease Outcome: Progressing   Problem: Nutrition: Goal: Adequate nutrition will be maintained Outcome: Progressing   Problem: Coping: Goal: Level of anxiety will decrease Outcome: Progressing   Problem: Elimination: Goal: Will not experience complications related to bowel motility Outcome: Progressing Goal: Will not experience complications related to urinary retention Outcome: Progressing   Problem: Pain Management: Goal: General experience of comfort will improve Outcome: Progressing   Problem: Safety: Goal: Ability to remain free from injury will improve Outcome: Progressing   Problem: Skin Integrity: Goal: Risk for impaired skin integrity will decrease Outcome: Progressing   Problem: Clinical Measurements: Goal: Will remain free from infection Outcome: Not Progressing  Abscess on foot, continue IV antibiotics, no worsening of condition

## 2023-08-11 DIAGNOSIS — L089 Local infection of the skin and subcutaneous tissue, unspecified: Secondary | ICD-10-CM | POA: Diagnosis present

## 2023-08-11 DIAGNOSIS — F1110 Opioid abuse, uncomplicated: Secondary | ICD-10-CM | POA: Diagnosis present

## 2023-08-11 DIAGNOSIS — L97529 Non-pressure chronic ulcer of other part of left foot with unspecified severity: Secondary | ICD-10-CM | POA: Diagnosis present

## 2023-08-11 DIAGNOSIS — Z885 Allergy status to narcotic agent status: Secondary | ICD-10-CM | POA: Diagnosis not present

## 2023-08-11 DIAGNOSIS — L02612 Cutaneous abscess of left foot: Secondary | ICD-10-CM | POA: Diagnosis present

## 2023-08-11 DIAGNOSIS — L03116 Cellulitis of left lower limb: Secondary | ICD-10-CM | POA: Diagnosis present

## 2023-08-11 DIAGNOSIS — F1910 Other psychoactive substance abuse, uncomplicated: Secondary | ICD-10-CM | POA: Diagnosis present

## 2023-08-11 DIAGNOSIS — B182 Chronic viral hepatitis C: Secondary | ICD-10-CM | POA: Diagnosis present

## 2023-08-11 DIAGNOSIS — F17210 Nicotine dependence, cigarettes, uncomplicated: Secondary | ICD-10-CM | POA: Diagnosis present

## 2023-08-11 NOTE — Plan of Care (Signed)

## 2023-08-11 NOTE — Progress Notes (Signed)
PROGRESS NOTE    Jon Castro  WUX:[id number] DOB: 08/04/1986 DOA: 08/08/2023 PCP: Patient, [name] Per    Brief Narrative:   Jon Castro is a 37 y.o. male with medical history significant of major depressive disorder, chronic hepatitis, IV drug abuse in context of polysubstance abuse.  Also has a history of MSSA bacteremia and tricuspid valve endocarditis in 2021.  This patient has a history of multiple issues with skin wounds from utilizing IV and/or skin popping heroin.  He was last admitted on 10/10 for left foot infection ulceration but left AMA because he was unable to obtain IV narcotics noting he specifically requested Dilaudid.  He returned to the ED 1 time since then on 10/20 but was stable enough to be discharged home on doxycycline.  He returned to the ER on 12/10 due to continued issues with with purulent drainage.    Assessment and Plan: Cellulitis with likely abscess left foot Orthopedic service has been consulted- continue IV abx Will allow p.o. Percocet for pain along with scheduled IV Toradol plus PPI Blood cultures have been obtained and NGTD- repeat if he has a fever  IV drug abuse/polysubstance abuse Patient admits to daily use of heroin with with last use 24 hours prior to presentation Urine drug screen was positive for benzodiazepines, cocaine and THC -TOC consulted-- patient not sure he wants to stop   History of tricuspid valve endocarditis/MSSA bacteremia Cardiac exam unremarkable Follow-up on blood cultures   History of chronic hepatitis C Current LFTs are unremarkable     DVT prophylaxis: enoxaparin (LOVENOX) injection 40 mg Start: 08/09/23 1200    Code Status: Full Code   Disposition Plan:  Level of care: Med-Surg Status is: inpt    Consultants:  ortho   Subjective: Not sure he wants to stop using or be placed on methadone/suboxone  Objective: Vitals:   08/10/23 1642 08/10/23 2100 08/11/23 0419 08/11/23 0916  BP: 121/80  125/79 129/80 111/68  Pulse: 66 70 60 100  Resp:  20 20   Temp: 98.4 F (36.9 C) 98.8 F (37.1 C) 98.2 F (36.8 C) 100.3 F (37.9 C)  TempSrc:      SpO2: 100% 100% 100% 100%  Weight:      Height:        Intake/Output Summary (Last 24 hours) at 08/11/2023 1105 Last data filed at 08/11/2023 2536 Gross per 24 hour  Intake 1702.53 ml  Output 200 ml  Net 1502.53 ml   Filed Weights   08/09/23 0159  Weight: 79 kg    Examination:    General: Appearance:    Older than stated age male in no acute distress     Lungs:      respirations unlabored  Heart:    Tachycardic.    MS:   All extremities are intact.   Neurologic:   Awake, alert       Data Reviewed: I have personally reviewed following labs and imaging studies  CBC: Recent Labs  Lab 08/08/23 2352 08/09/23 0656 08/10/23 1548  WBC 14.6* 10.4 8.3  NEUTROABS 9.8* 7.7  --   HGB 12.3* 12.2* 13.2  HCT 36.8* 36.9* 38.9*  MCV 81.8 83.9 82.8  PLT 228 203 211   Basic Metabolic Panel: Recent Labs  Lab 08/08/23 2352 08/09/23 0656 08/10/23 1548  NA 138 135 141  K 3.3* 3.8 3.8  CL 103 108 114*  CO2 24 23 22   GLUCOSE 109* 162* 98  BUN 10 8 9   CREATININE  0.95 0.84 1.09  CALCIUM 8.7* 8.6* 8.6*   GFR: Estimated Creatinine Clearance: 102.7 mL/min (by C-G formula based on SCr of 1.09 mg/dL). Liver Function Tests: Recent Labs  Lab 08/08/23 2352 08/09/23 0656  AST 27 27  ALT 33 31  ALKPHOS 61 53  BILITOT 1.1 1.1  PROT 6.3* 6.2*  ALBUMIN 3.0* 2.8*   No results for input(s): "LIPASE", "AMYLASE" in the last 168 hours. No results for input(s): "AMMONIA" in the last 168 hours. Coagulation Profile: No results for input(s): "INR", "PROTIME" in the last 168 hours. Cardiac Enzymes: No results for input(s): "CKTOTAL", "CKMB", "CKMBINDEX", "TROPONINI" in the last 168 hours. BNP (last 3 results) No results for input(s): "PROBNP" in the last 8760 hours. HbA1C: No results for input(s): "HGBA1C" in the last 72  hours. CBG: No results for input(s): "GLUCAP" in the last 168 hours. Lipid Profile: No results for input(s): "CHOL", "HDL", "LDLCALC", "TRIG", "CHOLHDL", "LDLDIRECT" in the last 72 hours. Thyroid Function Tests: No results for input(s): "TSH", "T4TOTAL", "FREET4", "T3FREE", "THYROIDAB" in the last 72 hours. Anemia Panel: No results for input(s): "VITAMINB12", "FOLATE", "FERRITIN", "TIBC", "IRON", "RETICCTPCT" in the last 72 hours. Sepsis Labs: Recent Labs  Lab 08/09/23 0007  LATICACIDVEN 0.6    Recent Results (from the past 240 hours)  Culture, blood (routine x 2)     Status: None (Preliminary result)   Collection Time: 08/08/23 10:36 PM   Specimen: BLOOD LEFT FOREARM  Result Value Ref Range Status   Specimen Description BLOOD LEFT FOREARM  Final   Special Requests   Final    BOTTLES DRAWN AEROBIC AND ANAEROBIC Blood Culture adequate volume   Culture   Final    NO GROWTH 2 DAYS Performed at [facility], [address] [place], [place] [address]    Report Status PENDING  Incomplete  MRSA Next Gen by PCR, Nasal     Status: Abnormal   Collection Time: 08/10/23  3:53 AM   Specimen: Nasal Mucosa; Nasal Swab  Result Value Ref Range Status   MRSA by PCR Next Gen DETECTED (A) NOT DETECTED Final    Comment: RESULT CALLED TO, READ BACK BY AND VERIFIED WITH: DOLAN RN 12/122024 @ 0606 BY AB (NOTE) The GeneXpert MRSA Assay (FDA approved for NASAL specimens only), is one component of a comprehensive MRSA colonization surveillance program. It is not intended to diagnose MRSA infection nor to guide or monitor treatment for MRSA infections. Test performance is not FDA approved in patients less than 14 years old. Performed at [facility], [address] [place], [place] [address]          Radiology Studies: No results found.       Scheduled Meds:  Chlorhexidine Gluconate Cloth  6 each Topical Q0600   enoxaparin (LOVENOX) injection  40 mg Subcutaneous Q24H    ketorolac  30 mg Intravenous Q6H   mupirocin ointment  1 Application Nasal BID   nicotine  21 mg Transdermal Daily   pantoprazole  40 mg Oral Daily   sodium chloride flush  3 mL Intravenous Q12H   Continuous Infusions:  ceFEPime (MAXIPIME) IV 2 g (08/11/23 0534)   vancomycin 1,500 mg (08/11/23 0631)     LOS: 0 days    Time spent: 45 minutes spent on chart review, discussion with nursing staff, consultants, updating family and interview/physical exam; more than 50% of that time was spent in counseling and/or coordination of care.    Joseph Art, DO [facility]  Available via Epic secure chat 7am-7pm After these hours, please refer to coverage provider listed on [email or web address] 08/11/2023, 11:05 AM

## 2023-08-12 DIAGNOSIS — L03116 Cellulitis of left lower limb: Secondary | ICD-10-CM | POA: Diagnosis not present

## 2023-08-12 MED ORDER — CLOTRIMAZOLE 1 % EX CREA
TOPICAL_CREAM | Freq: Two times a day (BID) | CUTANEOUS | Status: DC
  Filled 2023-08-12: qty 15

## 2023-08-12 MED ORDER — ZOLPIDEM TARTRATE 5 MG PO TABS
5.0000 mg | ORAL_TABLET | Freq: Every evening | ORAL | Status: AC | PRN
Start: 2023-08-12 — End: 2023-08-12
  Administered 2023-08-12: 5 mg via ORAL
  Filled 2023-08-12: qty 1

## 2023-08-12 NOTE — Progress Notes (Signed)
IV Team consult: Received for blood draw. Pt does not have a central line for VASTeam to draw blood. Labs per phlebotomy. Message to RN.

## 2023-08-12 NOTE — Progress Notes (Signed)
PROGRESS NOTE    Jon Castro  UJW:[id number] DOB: 21-Apr-1986 DOA: 08/08/2023 PCP: Patient, [name] Per    Brief Narrative:   Jon Castro is a 37 y.o. male with medical history significant of major depressive disorder, chronic hepatitis, IV drug abuse in context of polysubstance abuse.  Also has a history of MSSA bacteremia and tricuspid valve endocarditis in 2021.  This patient has a history of multiple issues with skin wounds from utilizing IV and/or skin popping heroin.  He was last admitted on 10/10 for left foot infection ulceration but left AMA because he was unable to obtain IV narcotics noting he specifically requested Dilaudid.  He returned to the ED 1 time since then on 10/20 but was stable enough to be discharged home on doxycycline.  He returned to the ER on 12/10 due to continued issues with with purulent drainage.    Assessment and Plan: Cellulitis with superficial draining abscess left foot Orthopedic service has been consulted- continue IV abx- no need for surgery Will allow p.o. Percocet for pain  Blood cultures have been obtained and NGTD- repeat if he has a fever -change to PO abx in AM and monitor as unable to get labs  IV drug abuse/polysubstance abuse Patient admits to daily use of heroin with with last use 24 hours prior to presentation Urine drug screen was positive for benzodiazepines, cocaine and THC -TOC consulted-- patient not sure he wants to stop   History of tricuspid valve endocarditis/MSSA bacteremia Cardiac exam unremarkable Follow-up on blood cultures   History of chronic hepatitis C Current LFTs are unremarkable     DVT prophylaxis: enoxaparin (LOVENOX) injection 40 mg Start: 08/09/23 1200    Code Status: Full Code   Disposition Plan:  Level of care: Med-Surg Status is: inpt-- still needs TOC and changed to Po abx    Consultants:  ortho   Subjective: Continues to c/o pain-- has not decided if he wants  methadone/suboxone  Objective: Vitals:   08/11/23 1636 08/11/23 2136 08/12/23 0430 08/12/23 0839  BP: 128/87 112/71 110/66 120/66  Pulse: 61 (!) 57 78 61  Resp: 18 18 18 18   Temp: 98.5 F (36.9 C) 98.3 F (36.8 C) 99.7 F (37.6 C) 98 F (36.7 C)  TempSrc:  Oral Oral   SpO2: 100% 100% 100% 100%  Weight:      Height:        Intake/Output Summary (Last 24 hours) at 08/12/2023 1118 Last data filed at 08/12/2023 0932 Gross per 24 hour  Intake 1660 ml  Output 400 ml  Net 1260 ml   Filed Weights   08/09/23 0159  Weight: 79 kg    Examination:   General: Appearance:    Well developed, well nourished male in no acute distress     Lungs:    respirations unlabored  Heart:    Normal heart rate. .   MS:   All extremities are intact.   Neurologic:   Awake, alert       Data Reviewed: I have personally reviewed following labs and imaging studies  CBC: Recent Labs  Lab 08/08/23 2352 08/09/23 0656 08/10/23 1548  WBC 14.6* 10.4 8.3  NEUTROABS 9.8* 7.7  --   HGB 12.3* 12.2* 13.2  HCT 36.8* 36.9* 38.9*  MCV 81.8 83.9 82.8  PLT 228 203 211   Basic Metabolic Panel: Recent Labs  Lab 08/08/23 2352 08/09/23 0656 08/10/23 1548  NA 138 135 141  K 3.3* 3.8 3.8  CL 103  108 114*  CO2 24 23 22   GLUCOSE 109* 162* 98  BUN 10 8 9   CREATININE 0.95 0.84 1.09  CALCIUM 8.7* 8.6* 8.6*   GFR: Estimated Creatinine Clearance: 102.7 mL/min (by C-G formula based on SCr of 1.09 mg/dL). Liver Function Tests: Recent Labs  Lab 08/08/23 2352 08/09/23 0656  AST 27 27  ALT 33 31  ALKPHOS 61 53  BILITOT 1.1 1.1  PROT 6.3* 6.2*  ALBUMIN 3.0* 2.8*   No results for input(s): "LIPASE", "AMYLASE" in the last 168 hours. No results for input(s): "AMMONIA" in the last 168 hours. Coagulation Profile: No results for input(s): "INR", "PROTIME" in the last 168 hours. Cardiac Enzymes: No results for input(s): "CKTOTAL", "CKMB", "CKMBINDEX", "TROPONINI" in the last 168 hours. BNP (last 3  results) No results for input(s): "PROBNP" in the last 8760 hours. HbA1C: No results for input(s): "HGBA1C" in the last 72 hours. CBG: No results for input(s): "GLUCAP" in the last 168 hours. Lipid Profile: No results for input(s): "CHOL", "HDL", "LDLCALC", "TRIG", "CHOLHDL", "LDLDIRECT" in the last 72 hours. Thyroid Function Tests: No results for input(s): "TSH", "T4TOTAL", "FREET4", "T3FREE", "THYROIDAB" in the last 72 hours. Anemia Panel: No results for input(s): "VITAMINB12", "FOLATE", "FERRITIN", "TIBC", "IRON", "RETICCTPCT" in the last 72 hours. Sepsis Labs: Recent Labs  Lab 08/09/23 0007  LATICACIDVEN 0.6    Recent Results (from the past 240 hours)  Culture, blood (routine x 2)     Status: None (Preliminary result)   Collection Time: 08/08/23 10:36 PM   Specimen: BLOOD LEFT FOREARM  Result Value Ref Range Status   Specimen Description BLOOD LEFT FOREARM  Final   Special Requests   Final    BOTTLES DRAWN AEROBIC AND ANAEROBIC Blood Culture adequate volume   Culture   Final    NO GROWTH 2 DAYS Performed at [facility], [address], [place], [place] [address]    Report Status PENDING  Incomplete  MRSA Next Gen by PCR, Nasal     Status: Abnormal   Collection Time: 08/10/23  3:53 AM   Specimen: Nasal Mucosa; Nasal Swab  Result Value Ref Range Status   MRSA by PCR Next Gen DETECTED (A) NOT DETECTED Final    Comment: RESULT CALLED TO, READ BACK BY AND VERIFIED WITH: DOLAN RN 12/122024 @ 0606 BY AB (NOTE) The GeneXpert MRSA Assay (FDA approved for NASAL specimens only), is one component of a comprehensive MRSA colonization surveillance program. It is not intended to diagnose MRSA infection nor to guide or monitor treatment for MRSA infections. Test performance is not FDA approved in patients less than 64 years old. Performed at [facility], [address], [place], [place] [address]          Radiology Studies: No results  found.       Scheduled Meds:  Chlorhexidine Gluconate Cloth  6 each Topical Q0600   clotrimazole   Topical BID   enoxaparin (LOVENOX) injection  40 mg Subcutaneous Q24H   mupirocin ointment  1 Application Nasal BID   nicotine  21 mg Transdermal Daily   pantoprazole  40 mg Oral Daily   sodium chloride flush  3 mL Intravenous Q12H   Continuous Infusions:  ceFEPime (MAXIPIME) IV 2 g (08/12/23 0427)   vancomycin 1,500 mg (08/12/23 0612)     LOS: 1 day    Time spent: 45 minutes spent on chart review, discussion with nursing staff, consultants, updating family and interview/physical exam; more than 50% of that  time was spent in counseling and/or coordination of care.    Joseph Art, DO [facility] Available via Epic secure chat 7am-7pm After these hours, please refer to coverage provider listed on [email or web address] 08/12/2023, 11:18 AM

## 2023-08-12 NOTE — Plan of Care (Signed)

## 2023-08-13 DIAGNOSIS — L03116 Cellulitis of left lower limb: Secondary | ICD-10-CM | POA: Diagnosis not present

## 2023-08-13 MED ORDER — SULFAMETHOXAZOLE-TRIMETHOPRIM 800-160 MG PO TABS: 1.0000 | ORAL_TABLET | Freq: Two times a day (BID) | ORAL | 0 refills | Status: DC

## 2023-08-13 MED ORDER — SULFAMETHOXAZOLE-TRIMETHOPRIM 800-160 MG PO TABS
1.0000 | ORAL_TABLET | Freq: Two times a day (BID) | ORAL | Status: DC
  Administered 2023-08-13: 1 via ORAL
  Filled 2023-08-13 (×2): qty 1

## 2023-08-13 NOTE — Progress Notes (Signed)
Patient refused dressing change to his left foot.

## 2023-08-13 NOTE — Progress Notes (Signed)
PROGRESS NOTE    Jon Castro  ZOX:[id number] DOB: Jan 05, 1986 DOA: 08/08/2023 PCP: Patient, [name] Per    Brief Narrative:  Jon Castro is a 37 y.o. male with medical history significant of major depressive disorder, chronic hepatitis, IV drug abuse in context of polysubstance abuse.  Also has a history of MSSA bacteremia and tricuspid valve endocarditis in 2021.  This patient has a history of multiple issues with skin wounds from utilizing IV and/or skin popping heroin.  He was last admitted on 10/10 for left foot infection ulceration but left AMA because he was unable to obtain IV narcotics noting he specifically requested Dilaudid.  He returned to the ED 1 time since then on 10/20 but was stable enough to be discharged home on doxycycline.  He returned to the ER on 12/10 due to continued issues with  purulent drainage but on right foot.   Assessment and Plan: Cellulitis with superficial draining abscess left foot Orthopedic  consulted- continue abx- no need for surgery Will allow p.o. Percocet for pain for now-- patient would like to do suboxone-- TOC consulted Blood cultures have been obtained and NGTD- repeat if he has a fever -change to PO abx    IV drug abuse/polysubstance abuse Patient admits to daily use of heroin with with last use 24 hours prior to presentation Urine drug screen was positive for benzodiazepines, cocaine and THC -TOC consulted-- would like to try suboxone-- will need to call [facility] to see if he can be followed there   History of tricuspid valve endocarditis/MSSA bacteremia Cardiac exam unremarkable blood cultures- NGTD   History of chronic hepatitis C Current LFTs are unremarkable   DVT prophylaxis: enoxaparin (LOVENOX) injection 40 mg Start: 08/09/23 1200    Code Status: Full Code   Disposition Plan:  Level of care: Med-Surg Status is: Inpatient Remains inpatient appropriate-- ? Suboxone-- can he go to [facility] for this?     Consultants:  ortho  Subjective: Foot feeling better  Objective: Vitals:   08/12/23 1527 08/12/23 2023 08/13/23 0601 08/13/23 0902  BP: 110/63 122/75 117/74 108/67  Pulse: 64 67 60 68  Resp: 18 16 16    Temp: 98.2 F (36.8 C) 99.9 F (37.7 C) 98.6 F (37 C) 98.1 F (36.7 C)  TempSrc: Oral Oral Oral Oral  SpO2: 100% 100% 100% 100%  Weight:      Height:        Intake/Output Summary (Last 24 hours) at 08/13/2023 1040 Last data filed at 08/13/2023 0908 Gross per 24 hour  Intake 1695.07 ml  Output 1175 ml  Net 520.07 ml   Filed Weights   08/09/23 0159  Weight: 79 kg    Examination:   General: Appearance:    Older than stated age male in no acute distress     Lungs:     respirations unlabored  Heart:    Normal heart rate..    MS:   All extremities are intact.    Neurologic:   Awake, alert       Data Reviewed: I have personally reviewed following labs and imaging studies  CBC: Recent Labs  Lab 08/08/23 2352 08/09/23 0656 08/10/23 1548  WBC 14.6* 10.4 8.3  NEUTROABS 9.8* 7.7  --   HGB 12.3* 12.2* 13.2  HCT 36.8* 36.9* 38.9*  MCV 81.8 83.9 82.8  PLT 228 203 211   Basic Metabolic Panel: Recent Labs  Lab 08/08/23 2352 08/09/23 0656 08/10/23 1548  NA 138 135 141  K 3.3*  3.8 3.8  CL 103 108 114*  CO2 24 23 22   GLUCOSE 109* 162* 98  BUN 10 8 9   CREATININE 0.95 0.84 1.09  CALCIUM 8.7* 8.6* 8.6*   GFR: Estimated Creatinine Clearance: 102.7 mL/min (by C-G formula based on SCr of 1.09 mg/dL). Liver Function Tests: Recent Labs  Lab 08/08/23 2352 08/09/23 0656  AST 27 27  ALT 33 31  ALKPHOS 61 53  BILITOT 1.1 1.1  PROT 6.3* 6.2*  ALBUMIN 3.0* 2.8*   No results for input(s): "LIPASE", "AMYLASE" in the last 168 hours. No results for input(s): "AMMONIA" in the last 168 hours. Coagulation Profile: No results for input(s): "INR", "PROTIME" in the last 168 hours. Cardiac Enzymes: No results for input(s): "CKTOTAL", "CKMB", "CKMBINDEX",  "TROPONINI" in the last 168 hours. BNP (last 3 results) No results for input(s): "PROBNP" in the last 8760 hours. HbA1C: No results for input(s): "HGBA1C" in the last 72 hours. CBG: No results for input(s): "GLUCAP" in the last 168 hours. Lipid Profile: No results for input(s): "CHOL", "HDL", "LDLCALC", "TRIG", "CHOLHDL", "LDLDIRECT" in the last 72 hours. Thyroid Function Tests: No results for input(s): "TSH", "T4TOTAL", "FREET4", "T3FREE", "THYROIDAB" in the last 72 hours. Anemia Panel: No results for input(s): "VITAMINB12", "FOLATE", "FERRITIN", "TIBC", "IRON", "RETICCTPCT" in the last 72 hours. Sepsis Labs: Recent Labs  Lab 08/09/23 0007  LATICACIDVEN 0.6    Recent Results (from the past 240 hours)  Culture, blood (routine x 2)     Status: None (Preliminary result)   Collection Time: 08/08/23 10:36 PM   Specimen: BLOOD LEFT FOREARM  Result Value Ref Range Status   Specimen Description BLOOD LEFT FOREARM  Final   Special Requests   Final    BOTTLES DRAWN AEROBIC AND ANAEROBIC Blood Culture adequate volume   Culture   Final    NO GROWTH 4 DAYS Performed at [facility], [address] [place], [place] [address]    Report Status PENDING  Incomplete  MRSA Next Gen by PCR, Nasal     Status: Abnormal   Collection Time: 08/10/23  3:53 AM   Specimen: Nasal Mucosa; Nasal Swab  Result Value Ref Range Status   MRSA by PCR Next Gen DETECTED (A) NOT DETECTED Final    Comment: RESULT CALLED TO, READ BACK BY AND VERIFIED WITH: DOLAN RN 12/122024 @ 0606 BY AB (NOTE) The GeneXpert MRSA Assay (FDA approved for NASAL specimens only), is one component of a comprehensive MRSA colonization surveillance program. It is not intended to diagnose MRSA infection nor to guide or monitor treatment for MRSA infections. Test performance is not FDA approved in patients less than 56 years old. Performed at [facility], [address] [place], [place] [address]           Radiology Studies: No results found.      Scheduled Meds:  Chlorhexidine Gluconate Cloth  6 each Topical Q0600   clotrimazole   Topical BID   enoxaparin (LOVENOX) injection  40 mg Subcutaneous Q24H   mupirocin ointment  1 Application Nasal BID   nicotine  21 mg Transdermal Daily   pantoprazole  40 mg Oral Daily   sodium chloride flush  3 mL Intravenous Q12H   sulfamethoxazole-trimethoprim  1 tablet Oral Q12H   Continuous Infusions:   LOS: 2 days    Time spent: 45 minutes spent on chart review, discussion with nursing staff, consultants, updating family and interview/physical exam; more than 50% of that time was spent in counseling  and/or coordination of care.    Joseph Art, DO [facility] Available via Epic secure chat 7am-7pm After these hours, please refer to coverage provider listed on [email or web address] 08/13/2023, 10:40 AM

## 2023-08-13 NOTE — Discharge Summary (Signed)
Physician Discharge Summary  Jon Castro ZOX:[id number] DOB: 07/04/1986 DOA: 08/08/2023  PCP: Patient, [name] Per  Admit date: 08/08/2023 Discharge date: 08/13/2023  Admitted From: home Discharge disposition: home   Patient requesting early d/c.  In order to facilitate a safe d/c I have sent in abx   Recommendations for Outpatient Follow-Up:   Wound care Finish abc   Discharge Diagnosis:   Principal Problem:   Cellulitis of left foot Active Problems:   Cutaneous abscess of left foot   Wound infection    Discharge Condition: Improved.  Diet recommendation: Regular.  Wound care: None.  Code status: Full.   History of Present Illness:   Jon Castro is a 37 y.o. male with medical history significant of major depressive disorder, chronic hepatitis, IV drug abuse in context of polysubstance abuse.  Also has a history of MSSA bacteremia and tricuspid valve endocarditis in 2021.  This patient has a history of multiple issues with skin wounds from utilizing IV and/or skin popping heroin.  He was last admitted on 10/10 for left foot infection ulceration but left AMA because he was unable to obtain IV narcotics noting he specifically requested Dilaudid.  He returned to the ED 1 time since then on 10/20 but was stable enough to be discharged home on doxycycline.  He returned to the ER on 12/10 due to continued issues with with purulent drainage.  His initial white count was 14,600, differential within normal limits.  Lactic acid normal.  Patient has been given IV vancomycin, Flagyl, Vibramycin and Maxipime while in the ER.  Hospitalist service has been asked to evaluate the patient for admission.   Upon my evaluation this patient he confirmed the above history.  He admitted to using heroin last use 24 hours prior.  A urine drug screen has been obtained that was positive for benzodiazepines, cocaine and THC.  Patient is having significant discomfort in the  left foot with notable thick purulent drainage oozing out from a hole in the eschar.  Pulse present.   Hospital Course by Problem:   Cellulitis with superficial draining abscess left foot Orthopedic  consulted- continue abx- no need for surgery Blood cultures have been obtained and NGTD -change to PO abx -- patient is requesting to leave today   IV drug abuse/polysubstance abuse Patient admits to daily use of heroin with with last use 24 hours prior to presentation Urine drug screen was positive for benzodiazepines, cocaine and THC -TOC consulted   History of tricuspid valve endocarditis/MSSA bacteremia Cardiac exam unremarkable blood cultures- NGTD   History of chronic hepatitis C Current LFTs are unremarkable    Medical Consultants:   ortho   Discharge Exam:   Vitals:   08/13/23 0601 08/13/23 0902  BP: 117/74 108/67  Pulse: 60 68  Resp: 16   Temp: 98.6 F (37 C) 98.1 F (36.7 C)  SpO2: 100% 100%   Vitals:   08/12/23 1527 08/12/23 2023 08/13/23 0601 08/13/23 0902  BP: 110/63 122/75 117/74 108/67  Pulse: 64 67 60 68  Resp: 18 16 16    Temp: 98.2 F (36.8 C) 99.9 F (37.7 C) 98.6 F (37 C) 98.1 F (36.7 C)  TempSrc: Oral Oral Oral Oral  SpO2: 100% 100% 100% 100%  Weight:      Height:        General exam: Appears calm and comfortable.    The results of significant diagnostics from this hospitalization (including imaging, microbiology, ancillary and laboratory)  are listed below for reference.     Procedures and Diagnostic Studies:   DG Foot 2 Views Left Result Date: 08/08/2023 CLINICAL DATA:  Left foot infection. Injected heroin in the top of his foot a few weeks ago. EXAM: LEFT FOOT - 2 VIEW COMPARISON:  None Available. FINDINGS: No acute fracture or dislocation. Prominent dorsal spurring of the talar head. Joint spaces are preserved. No radiopaque foreign body identified. IMPRESSION: 1. No acute osseous abnormality or radiopaque foreign body.  Electronically Signed   By: Obie Dredge M.D.   On: 08/08/2023 17:59     Labs:   Basic Metabolic Panel: Recent Labs  Lab 08/08/23 2352 08/09/23 0656 08/10/23 1548  NA 138 135 141  K 3.3* 3.8 3.8  CL 103 108 114*  CO2 24 23 22   GLUCOSE 109* 162* 98  BUN 10 8 9   CREATININE 0.95 0.84 1.09  CALCIUM 8.7* 8.6* 8.6*   GFR Estimated Creatinine Clearance: 102.7 mL/min (by C-G formula based on SCr of 1.09 mg/dL). Liver Function Tests: Recent Labs  Lab 08/08/23 2352 08/09/23 0656  AST 27 27  ALT 33 31  ALKPHOS 61 53  BILITOT 1.1 1.1  PROT 6.3* 6.2*  ALBUMIN 3.0* 2.8*   No results for input(s): "LIPASE", "AMYLASE" in the last 168 hours. No results for input(s): "AMMONIA" in the last 168 hours. Coagulation profile No results for input(s): "INR", "PROTIME" in the last 168 hours.  CBC: Recent Labs  Lab 08/08/23 2352 08/09/23 0656 08/10/23 1548  WBC 14.6* 10.4 8.3  NEUTROABS 9.8* 7.7  --   HGB 12.3* 12.2* 13.2  HCT 36.8* 36.9* 38.9*  MCV 81.8 83.9 82.8  PLT 228 203 211   Cardiac Enzymes: No results for input(s): "CKTOTAL", "CKMB", "CKMBINDEX", "TROPONINI" in the last 168 hours. BNP: Invalid input(s): "POCBNP" CBG: No results for input(s): "GLUCAP" in the last 168 hours. D-Dimer No results for input(s): "DDIMER" in the last 72 hours. Hgb A1c No results for input(s): "HGBA1C" in the last 72 hours. Lipid Profile No results for input(s): "CHOL", "HDL", "LDLCALC", "TRIG", "CHOLHDL", "LDLDIRECT" in the last 72 hours. Thyroid function studies No results for input(s): "TSH", "T4TOTAL", "T3FREE", "THYROIDAB" in the last 72 hours.  Invalid input(s): "FREET3" Anemia work up No results for input(s): "VITAMINB12", "FOLATE", "FERRITIN", "TIBC", "IRON", "RETICCTPCT" in the last 72 hours. Microbiology Recent Results (from the past 240 hours)  Culture, blood (routine x 2)     Status: None (Preliminary result)   Collection Time: 08/08/23 10:36 PM   Specimen: BLOOD LEFT  FOREARM  Result Value Ref Range Status   Specimen Description BLOOD LEFT FOREARM  Final   Special Requests   Final    BOTTLES DRAWN AEROBIC AND ANAEROBIC Blood Culture adequate volume   Culture   Final    NO GROWTH 4 DAYS Performed at [facility], [address], [place], [place] [address]    Report Status PENDING  Incomplete  MRSA Next Gen by PCR, Nasal     Status: Abnormal   Collection Time: 08/10/23  3:53 AM   Specimen: Nasal Mucosa; Nasal Swab  Result Value Ref Range Status   MRSA by PCR Next Gen DETECTED (A) NOT DETECTED Final    Comment: RESULT CALLED TO, READ BACK BY AND VERIFIED WITH: DOLAN RN 12/122024 @ 0606 BY AB (NOTE) The GeneXpert MRSA Assay (FDA approved for NASAL specimens only), is one component of a comprehensive MRSA colonization surveillance program. It is not intended to diagnose MRSA infection nor  to guide or monitor treatment for MRSA infections. Test performance is not FDA approved in patients less than 91 years old. Performed at [facility], [address], [place], [place] [address]      Discharge Instructions:   Discharge Instructions     Diet general   Complete by: As directed    Discharge wound care:   Complete by: As directed    wound to be covered with a Vashe soaked gauze and change daily      Allergies as of 08/13/2023       Reactions   Codeine Nausea And Vomiting   Hydrocodone Itching   Tramadol Other (See Comments)   "messes with head"        Medication List     TAKE these medications    sulfamethoxazole-trimethoprim 800-160 MG tablet Commonly known as: BACTRIM DS Take 1 tablet by mouth every 12 (twelve) hours.               Discharge Care Instructions  (From admission, onward)           Start     Ordered   08/13/23 0000  Discharge wound care:       Comments: wound to be covered with a Vashe soaked gauze and change daily   08/13/23 1524            Follow-up Information     Nadara Mustard, MD Follow up in 1 week(s).   Specialty: Orthopedic Surgery Contact information: [address] [place] [address] [phone]                  Time coordinating discharge: 45 min  Signed:  Joseph Art DO  [facility] 08/13/2023, 3:24 PM

## 2023-08-13 NOTE — Progress Notes (Signed)
Tad Moore to be discharged Home per MD order. Discussed prescriptions and follow up appointments with the patient. Prescriptions  and medication list explained in detail. Patient verbalized understanding.  Skin clean, dry and intact without evidence of skin break down, no evidence of skin tears noted. IV catheter discontinued intact. Site without signs and symptoms of complications. Dressing and pressure applied. Pt denies pain at the site currently. No complaints noted.  Patient free of lines, drains, and wounds.   An After Visit Summary (AVS) was printed and given to the patient. Patient escorted via wheelchair, and discharged home via private auto.  Arvilla Meres, RN

## 2023-08-14 LAB — CULTURE, BLOOD (ROUTINE X 2)
Culture: NO GROWTH
Special Requests: ADEQUATE

## 2023-09-05 ENCOUNTER — Other Ambulatory Visit: Payer: Self-pay

## 2023-09-05 ENCOUNTER — Emergency Department (HOSPITAL_COMMUNITY)
Admission: EM | Admit: 2023-09-05 | Discharge: 2023-09-06 | Disposition: A | Discharge status: Home / Self Care | Payer: 59 | Attending: Emergency Medicine | Admitting: Emergency Medicine

## 2023-09-05 ENCOUNTER — Encounter (HOSPITAL_COMMUNITY): Payer: Self-pay

## 2023-09-05 DIAGNOSIS — F1190 Opioid use, unspecified, uncomplicated: Secondary | ICD-10-CM | POA: Diagnosis present

## 2023-09-05 DIAGNOSIS — R400 Somnolence: Secondary | ICD-10-CM | POA: Insufficient documentation

## 2023-09-05 NOTE — ED Triage Notes (Signed)
 Pt BIB [facility] d/t Overdose of heroin.  Family called [facility] - Pt was arousal so no Narcan was given.

## 2023-09-06 LAB — CBG MONITORING, ED: Glucose-Capillary: 116 mg/dL — ABNORMAL HIGH (ref 70–99)

## 2023-09-06 NOTE — ED Provider Notes (Signed)
 [place] EMERGENCY DEPARTMENT AT [facility] Provider Note   CSN: [id number] Arrival date & time: 09/05/23  2233     History Chief Complaint  Patient presents with   Drug Overdose    Jon Castro is a 38 y.o. male with h/o polysubstance use presents emergency room today for evaluation after drug use.  Per EMS, patient was at a friend's house helping her move when she noticed the bathroom door was locked.  Fire reported on the door and found the patient with drugs.  Reports he used heroin however patient was arousable so no Narcan was given.  Patient is somnolent but arousable here.  Not having any complaints. He reports that he snorted heroin tonight. EMS found drugs and glass pipe that were removed.    Drug Overdose       Home Medications Prior to Admission medications   Medication Sig Start Date End Date Taking? Authorizing Provider  sulfamethoxazole -trimethoprim  (BACTRIM  DS) 800-160 MG tablet Take 1 tablet by mouth every 12 (twelve) hours. 08/13/23   Vann, Jessica U, DO  cloNIDine  (CATAPRES ) 0.1 MG tablet Take 1 tablet (0.1 mg total) by mouth 3 (three) times daily for 5 days. Patient not taking: Reported on 03/24/2019 03/08/19 03/24/19  Ruthe Cornet, DO  traZODone  (DESYREL ) 50 MG tablet Take 1 tablet (50 mg total) by mouth at bedtime as needed for sleep. Patient not taking: Reported on 02/21/2019 02/11/19 03/24/19  Wonda Clarita BRAVO, NP      Allergies    Codeine, Hydrocodone , and Tramadol     Review of Systems   Review of Systems See HPI  Physical Exam Updated Vital Signs BP 98/65 (BP Location: Right Arm)   Pulse 65   Temp 97.7 F (36.5 C) (Temporal)   Resp 16   Ht 6' 1 (1.854 m)   Wt 79 kg   SpO2 100%   BMI 22.98 kg/m  Physical Exam Vitals and nursing note reviewed.  Constitutional:      Comments: Somnolent, disheveled, arousable to voice  HENT:     Mouth/Throat:     Mouth: Mucous membranes are moist.     Comments: Poor dentition Eyes:      General: No scleral icterus. Cardiovascular:     Rate and Rhythm: Normal rate.  Pulmonary:     Effort: Pulmonary effort is normal. No respiratory distress.  Skin:    General: Skin is warm and dry.     Comments: Scarring seen to bilateral upper extremities.   Neurological:     General: No focal deficit present.     Mental Status: He is oriented to person, place, and time.     Comments: Moving all extremities, oriented x3.      ED Results / Procedures / Treatments   Labs (all labs ordered are listed, but only abnormal results are displayed) Labs Reviewed  CBG MONITORING, ED - Abnormal; Notable for the following components:      Result Value   Glucose-Capillary 116 (*)    All other components within normal limits    EKG None  Radiology No results found.  Procedures Procedures   Medications Ordered in ED Medications - No data to display  ED Course/ Medical Decision Making/ A&P Clinical Course as of 09/06/23 0707  Wed Sep 06, 2023  0047 Somnolent but awakens to voice. Will still observe. [RR]  [phone] Patient sleeping, but arousable. On pulse oximetry maintaining good saturation. [RR]  0346 Somnolent but still arousable [RR]  0456 Patient somnolent, but  more easily arousable [RR]    Clinical Course User Index [RR] Bernis Ernst, PA-C   Medical Decision Making  38 y.o. male presents to the ER for evaluation of drug use. Differential diagnosis includes but is not limited to overdose versus intoxication. Vital signs unremarkable. Physical exam as noted above.   I independently reviewed and interpreted the patient's labs. CBG 116.  Patient has been somnolent, but arousable. He has been assessed multiple times by myself and nursing staff. Given that he was maintaining his airway, did not see a need for Narcan.   He is now requesting a coke and food.SABRA He is conversational. Oriented x3. Moving all extremities. Still does not have any complaints. Requests more food and asks  if he can stay until daylight. Resources given.   We discussed the results of the labs/imaging. The plan is stop using drugs. We discussed strict return precautions and red flag symptoms. The patient verbalized their understanding and agrees to the plan. The patient is stable and being discharged home in good condition.  Portions of this report may have been transcribed using voice recognition software. Every effort was made to ensure accuracy; however, inadvertent computerized transcription errors may be present.   Final Clinical Impression(s) / ED Diagnoses Final diagnoses:  Heroin use    Rx / DC Orders ED Discharge Orders     None         Bernis Ernst, PA-C 09/06/23 9282    Melvenia Motto, MD 09/06/23 [phone]

## 2023-09-06 NOTE — Discharge Instructions (Signed)
Contact a health care provider if:  You cannot take your medicines as told.  Your symptoms get worse.  You have a relapse.  Get help right away if:  You may have taken too much of an opioid (overdosed). Common symptoms of an overdose include:  Sleepiness or difficulty waking from sleep.  Decrease in attention or confusion.  Slurred speech.  Slowed breathing and a slow pulse (bradycardia).  Nausea and vomiting.  Abnormally small pupils.  You have serious thoughts about hurting yourself or others.  These symptoms may represent a serious problem that is an emergency. Do not wait to see if the symptoms will go away. Get medical help right away. Call your local emergency services (911 in the [place] Do not drive yourself to the hospital.  If you were prescribed a drug (naloxone) that reverses the effects of an opioid overdose, a friend, family member, or emergency services provider can administer the drug in an emergency.  If you ever feel like you may hurt yourself or others, or have thoughts about taking your own life, get help right away. You can go to your nearest emergency department or call:  Your local emergency services (911 in the [place]  A suicide crisis helpline, such as the National Suicide Prevention Lifeline at 1-800-273-8255 or 988 in the [place] This is open 24 hours a day.

## 2024-03-17 ENCOUNTER — Emergency Department (HOSPITAL_COMMUNITY): Payer: MEDICAID

## 2024-03-17 ENCOUNTER — Other Ambulatory Visit: Payer: Self-pay

## 2024-03-17 ENCOUNTER — Observation Stay (HOSPITAL_COMMUNITY)
Admission: EM | Admit: 2024-03-17 | Discharge: 2024-03-18 | Disposition: A | Discharge status: Still In Hospital | Payer: MEDICAID | Attending: Internal Medicine | Admitting: Internal Medicine

## 2024-03-17 ENCOUNTER — Encounter (HOSPITAL_COMMUNITY): Payer: Self-pay | Admitting: *Deleted

## 2024-03-17 DIAGNOSIS — B182 Chronic viral hepatitis C: Secondary | ICD-10-CM | POA: Diagnosis not present

## 2024-03-17 DIAGNOSIS — L02416 Cutaneous abscess of left lower limb: Principal | ICD-10-CM | POA: Insufficient documentation

## 2024-03-17 DIAGNOSIS — E876 Hypokalemia: Secondary | ICD-10-CM | POA: Diagnosis not present

## 2024-03-17 DIAGNOSIS — F1910 Other psychoactive substance abuse, uncomplicated: Secondary | ICD-10-CM | POA: Insufficient documentation

## 2024-03-17 DIAGNOSIS — A419 Sepsis, unspecified organism: Secondary | ICD-10-CM | POA: Insufficient documentation

## 2024-03-17 DIAGNOSIS — L02419 Cutaneous abscess of limb, unspecified: Secondary | ICD-10-CM | POA: Diagnosis present

## 2024-03-17 DIAGNOSIS — M79662 Pain in left lower leg: Secondary | ICD-10-CM | POA: Diagnosis present

## 2024-03-17 DIAGNOSIS — R509 Fever, unspecified: Secondary | ICD-10-CM | POA: Diagnosis not present

## 2024-03-17 DIAGNOSIS — L03116 Cellulitis of left lower limb: Principal | ICD-10-CM | POA: Insufficient documentation

## 2024-03-17 NOTE — ED Notes (Signed)
 Pt called x3 to go to a room. Pt could not be found.

## 2024-03-17 NOTE — ED Notes (Signed)
 Two different phlebotomist stuck the patient to obtain blood. Both were not able to collect labs, triage RN aware

## 2024-03-17 NOTE — ED Triage Notes (Signed)
 The pt has an abscess on the lt lower leg that has been there  for several days.  No known temp red and swollen  the wound drained yesterday

## 2024-03-18 DIAGNOSIS — F1190 Opioid use, unspecified, uncomplicated: Secondary | ICD-10-CM | POA: Diagnosis not present

## 2024-03-18 DIAGNOSIS — L03119 Cellulitis of unspecified part of limb: Secondary | ICD-10-CM | POA: Diagnosis not present

## 2024-03-18 DIAGNOSIS — L02419 Cutaneous abscess of limb, unspecified: Secondary | ICD-10-CM

## 2024-03-18 LAB — COMPREHENSIVE METABOLIC PANEL WITH GFR
ALT: 69 U/L — ABNORMAL HIGH (ref 0–44)
AST: 52 U/L — ABNORMAL HIGH (ref 15–41)
Albumin: 2.8 g/dL — ABNORMAL LOW (ref 3.5–5.0)
Alkaline Phosphatase: 70 U/L (ref 38–126)
Anion gap: 10 (ref 5–15)
BUN: 6 mg/dL (ref 6–20)
CO2: 27 mmol/L (ref 22–32)
Calcium: 8.4 mg/dL — ABNORMAL LOW (ref 8.9–10.3)
Chloride: 99 mmol/L (ref 98–111)
Creatinine, Ser: 0.81 mg/dL (ref 0.61–1.24)
GFR, Estimated: >60 mL/min (ref >60)
Glucose, Bld: 149 mg/dL — ABNORMAL HIGH (ref 70–99)
Potassium: 3.4 mmol/L — ABNORMAL LOW (ref 3.5–5.1)
Sodium: 136 mmol/L (ref 135–145)
Total Bilirubin: 0.7 mg/dL (ref 0.0–1.2)
Total Protein: 6.6 g/dL (ref 6.5–8.1)

## 2024-03-18 LAB — I-STAT CG4 LACTIC ACID, ED: Lactic Acid, Venous: 1.8 mmol/L (ref 0.5–1.9)

## 2024-03-18 LAB — URINALYSIS, ROUTINE W REFLEX MICROSCOPIC
Bilirubin Urine: NEGATIVE
Glucose, UA: NEGATIVE mg/dL
Hgb urine dipstick: NEGATIVE
Ketones, ur: NEGATIVE mg/dL
Leukocytes,Ua: NEGATIVE
Nitrite: NEGATIVE
Protein, ur: NEGATIVE mg/dL
Specific Gravity, Urine: 1.012 (ref 1.005–1.030)
pH: 6 (ref 5.0–8.0)

## 2024-03-18 LAB — RAPID URINE DRUG SCREEN, HOSP PERFORMED
Amphetamines: NOT DETECTED
Barbiturates: NOT DETECTED
Benzodiazepines: NOT DETECTED
Cocaine: POSITIVE — AB
Opiates: POSITIVE — AB
Tetrahydrocannabinol: NOT DETECTED

## 2024-03-18 LAB — CBC
HCT: 35.1 % — ABNORMAL LOW (ref 39.0–52.0)
Hemoglobin: 11.9 g/dL — ABNORMAL LOW (ref 13.0–17.0)
MCH: 28.3 pg (ref 26.0–34.0)
MCHC: 33.9 g/dL (ref 30.0–36.0)
MCV: 83.6 fL (ref 80.0–100.0)
Platelets: 197 K/uL (ref 150–400)
RBC: 4.2 MIL/uL — ABNORMAL LOW (ref 4.22–5.81)
RDW: 13.6 % (ref 11.5–15.5)
WBC: 14.6 K/uL — ABNORMAL HIGH (ref 4.0–10.5)
nRBC: 0 % (ref 0.0–0.2)

## 2024-03-18 MED ORDER — OXYCODONE HCL 5 MG PO TABS
5.0000 mg | ORAL_TABLET | Freq: Four times a day (QID) | ORAL | Status: DC | PRN
  Administered 2024-03-18 (×2): 5 mg via ORAL
  Filled 2024-03-18 (×2): qty 1

## 2024-03-18 MED ORDER — MORPHINE SULFATE (PF) 4 MG/ML IV SOLN
4.0000 mg | Freq: Once | INTRAVENOUS | Status: AC
  Administered 2024-03-18: 4 mg via INTRAVENOUS
  Filled 2024-03-18: qty 1

## 2024-03-18 MED ORDER — KETOROLAC TROMETHAMINE 30 MG/ML IJ SOLN
30.0000 mg | Freq: Four times a day (QID) | INTRAMUSCULAR | Status: DC | PRN
  Administered 2024-03-18: 30 mg via INTRAVENOUS
  Filled 2024-03-18: qty 1

## 2024-03-18 MED ORDER — ONDANSETRON HCL 4 MG/2ML IJ SOLN
4.0000 mg | Freq: Once | INTRAMUSCULAR | Status: AC
  Administered 2024-03-18: 4 mg via INTRAVENOUS
  Filled 2024-03-18: qty 2

## 2024-03-18 MED ORDER — ACETAMINOPHEN 500 MG PO TABS
1000.0000 mg | ORAL_TABLET | Freq: Four times a day (QID) | ORAL | Status: DC | PRN
  Administered 2024-03-18: 1000 mg via ORAL
  Filled 2024-03-18: qty 2

## 2024-03-18 MED ORDER — SENNOSIDES-DOCUSATE SODIUM 8.6-50 MG PO TABS: 1.0000 | ORAL_TABLET | Freq: Every evening | ORAL | Status: DC | PRN

## 2024-03-18 MED ORDER — LIDOCAINE-EPINEPHRINE (PF) 2 %-1:200000 IJ SOLN
10.0000 mL | Freq: Once | INTRAMUSCULAR | Status: AC
  Administered 2024-03-18: 10 mL
  Filled 2024-03-18: qty 20

## 2024-03-18 MED ORDER — ENOXAPARIN SODIUM 40 MG/0.4ML IJ SOSY: 40.0000 mg | PREFILLED_SYRINGE | INTRAMUSCULAR | Status: DC

## 2024-03-18 MED ORDER — TETANUS-DIPHTH-ACELL PERTUSSIS 5-2.5-18.5 LF-MCG/0.5 IM SUSY
0.5000 mL | PREFILLED_SYRINGE | Freq: Once | INTRAMUSCULAR | Status: DC
Start: 2024-03-18 — End: 2024-03-18
  Filled 2024-03-18: qty 0.5

## 2024-03-18 MED ORDER — VANCOMYCIN HCL IN DEXTROSE 1-5 GM/200ML-% IV SOLN
1000.0000 mg | Freq: Three times a day (TID) | INTRAVENOUS | Status: DC
  Filled 2024-03-18: qty 200

## 2024-03-18 MED ORDER — ONDANSETRON HCL 4 MG/2ML IJ SOLN: 4.0000 mg | Freq: Four times a day (QID) | INTRAMUSCULAR | Status: DC | PRN

## 2024-03-18 MED ORDER — ALBUTEROL SULFATE (2.5 MG/3ML) 0.083% IN NEBU: 2.5000 mg | INHALATION_SOLUTION | RESPIRATORY_TRACT | Status: DC | PRN

## 2024-03-18 MED ORDER — VANCOMYCIN HCL 1750 MG/350ML IV SOLN
1750.0000 mg | Freq: Once | INTRAVENOUS | Status: AC
  Administered 2024-03-18: 1750 mg via INTRAVENOUS
  Filled 2024-03-18: qty 350

## 2024-03-18 MED ORDER — SODIUM CHLORIDE 0.9 % IV BOLUS
1000.0000 mL | Freq: Once | INTRAVENOUS | Status: AC
  Administered 2024-03-18: 1000 mL via INTRAVENOUS

## 2024-03-18 MED ORDER — IPRATROPIUM-ALBUTEROL 0.5-2.5 (3) MG/3ML IN SOLN: 3.0000 mL | RESPIRATORY_TRACT | Status: DC | PRN

## 2024-03-18 MED ORDER — ACETAMINOPHEN 325 MG PO TABS
650.0000 mg | ORAL_TABLET | Freq: Once | ORAL | Status: AC
  Administered 2024-03-18: 650 mg via ORAL
  Filled 2024-03-18: qty 2

## 2024-03-18 MED ORDER — POLYETHYLENE GLYCOL 3350 17 G PO PACK: 17.0000 g | PACK | Freq: Every day | ORAL | Status: DC | PRN

## 2024-03-18 MED ORDER — METOPROLOL TARTRATE 5 MG/5ML IV SOLN: 5.0000 mg | INTRAVENOUS | Status: DC | PRN

## 2024-03-18 MED ORDER — MELATONIN 3 MG PO TABS: 6.0000 mg | ORAL_TABLET | Freq: Every evening | ORAL | Status: DC | PRN

## 2024-03-18 MED ORDER — HYDRALAZINE HCL 20 MG/ML IJ SOLN: 10.0000 mg | INTRAMUSCULAR | Status: DC | PRN

## 2024-03-18 MED ORDER — OXYCODONE HCL 5 MG PO TABS: 5.0000 mg | ORAL_TABLET | Freq: Four times a day (QID) | ORAL | Status: DC | PRN

## 2024-03-18 NOTE — Hospital Course (Addendum)
 Brief Narrative:  38 year old with history of polysubstance abuse heroin and cocaine, prior MSSA bacteremia, TV endocarditis, chronic hepatitis C comes to the hospital with left lower extremity cellulitis with concerns of abscess. X-ray of the hip/hip shows diffuse edema without evidence of osteomyelitis.  Status post I&D in the ER.  Currently on IV vancomycin  Unfortunately later left AMA. Understood the risk of leaving AMA   Assessment & Plan:  Principal Problem:   Cellulitis and abscess of leg     Sepsis secondary to cellulitis and abscess of L lower leg, s/p I+D of abscess 7/21  X-ray of the hip/hip shows diffuse edema without evidence of osteomyelitis.  Status post I&D in the ER.  Currently on IV vancomycin  - Follow culture data. - Patient declined Tdap - Pain control  Hypokalemia - As needed repletion   Opiate use disorder, severe  Hx polysubstance use, former IVDU  -Counseled to quit using drugs.  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   Chronic hepatitis C, chronic hepatocellular liver injury: He should have surveillance imaging of his liver outpatient in setting of chronic Hep C and should be considered for treatment if he can sustain remission from drug use.    Body mass index is 22.98 kg/m.   Unfortunately patient is declining any further blood work at this time   DVT prophylaxis:  Lovenox  Code Status:  Full Code Family Communication:   Status is: Inpatient Continue hospital stay for antibiotics    Subjective: Seen at bedside reporting of left lower extremity pain   Examination:  General exam: Appears calm and comfortable  Respiratory system: Clear to auscultation. Respiratory effort normal. Cardiovascular system: S1 & S2 heard, RRR. No JVD, murmurs, rubs, gallops or clicks. No pedal edema. Gastrointestinal system: Abdomen is nondistended, soft and nontender. No organomegaly or masses felt. Normal bowel  sounds heard. Central nervous system: Alert and oriented. No focal neurological deficits. Extremities: Symmetric 5 x 5 power. Skin: Left lower extremity erythema noted.  Dressing is in place Psychiatry: Judgement and insight appear normal. Mood & affect appropriate.

## 2024-03-18 NOTE — ED Notes (Signed)
 Wound wrapped with nonadherent gauze and roller gauze

## 2024-03-18 NOTE — ED Notes (Signed)
 Suture cart w/ L/D tray outside pt room

## 2024-03-18 NOTE — Progress Notes (Signed)
 Pharmacy Antibiotic Note  Jon Castro is a 38 y.o. male admitted on 03/17/2024 with cellulitis and abscess.  Pharmacy has been consulted for vancomycin  dosing.  Plan: Vancomycin  1750mg  x1 then 1000mg  IV Q8H. Goal AUC 400-550.  Expected AUC 450.  Height: 6' 1 (185.4 cm) Weight: 79 kg (174 lb 2.6 oz) IBW/kg (Calculated) : 79.9  Temp (24hrs), Avg:100 F (37.8 C), Min:98.3 F (36.8 C), Max:102.6 F (39.2 C)  Recent Labs  Lab 03/17/24 0144 03/18/24 0141  WBC 14.6*  --   CREATININE 0.81  --   LATICACIDVEN  --  1.8    Estimated Creatinine Clearance: 136.8 mL/min (by C-G formula based on SCr of 0.81 mg/dL).    Allergies  Allergen Reactions   Codeine Nausea And Vomiting   Hydrocodone  Itching   Tramadol  Other (See Comments)    messes with head    Thank you for allowing pharmacy to be a part of this patient's care.  Marvetta Dauphin, PharmD, BCPS  03/18/2024 5:01 AM

## 2024-03-18 NOTE — ED Notes (Signed)
Pt provided with a sandwich and soda.  °

## 2024-03-18 NOTE — Progress Notes (Signed)
 PROGRESS NOTE    Jon Castro  FMW:[id number] DOB: 12-26-1985 DOA: 03/17/2024 PCP: Patient, [name] Per    Brief Narrative:  38 year old with history of polysubstance abuse heroin and cocaine, prior MSSA bacteremia, TV endocarditis, chronic hepatitis C comes to the hospital with left lower extremity cellulitis with concerns of abscess.   Assessment & Plan:  Principal Problem:   Cellulitis and abscess of leg     Sepsis secondary to cellulitis and abscess of L lower leg, s/p I+D of abscess 7/21  X-ray of the hip/hip shows diffuse edema without evidence of osteomyelitis.  Status post I&D in the ER.  Currently on IV vancomycin  - Follow culture data. - Patient declined Tdap - Pain control  Hypokalemia - As needed repletion   Opiate use disorder, severe  Hx polysubstance use, former IVDU  -Counseled to quit using drugs.  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   Chronic hepatitis C, chronic hepatocellular liver injury: He should have surveillance imaging of his liver outpatient in setting of chronic Hep C and should be considered for treatment if he can sustain remission from drug use.    Body mass index is 22.98 kg/m.   Unfortunately patient is declining any further blood work at this time   DVT prophylaxis:  Lovenox  Code Status:  Full Code Family Communication:   Status is: Inpatient Continue hospital stay for antibiotics    Subjective: Seen at bedside reporting of left lower extremity pain   Examination:  General exam: Appears calm and comfortable  Respiratory system: Clear to auscultation. Respiratory effort normal. Cardiovascular system: S1 & S2 heard, RRR. No JVD, murmurs, rubs, gallops or clicks. No pedal edema. Gastrointestinal system: Abdomen is nondistended, soft and nontender. No organomegaly or masses felt. Normal bowel sounds heard. Central nervous system: Alert and oriented. No focal  neurological deficits. Extremities: Symmetric 5 x 5 power. Skin: Left lower extremity erythema noted.  Dressing is in place Psychiatry: Judgement and insight appear normal. Mood & affect appropriate.                Diet Orders (From admission, onward)     Start     Ordered   03/18/24 0434  Diet regular Room service appropriate? Yes; Fluid consistency: Thin  Diet effective now       Question Answer Comment  Room service appropriate? Yes   Fluid consistency: Thin      03/18/24 0435            Objective: Vitals:   03/18/24 0500 03/18/24 0528 03/18/24 0540 03/18/24 0756  BP: (!) 128/93  134/86 129/76  Pulse: 96  89 76  Resp:    16  Temp:  98.6 F (37 C) 98.8 F (37.1 C) 98.3 F (36.8 C)  TempSrc:   Oral Oral  SpO2: 91%  100% 100%  Weight:      Height:        Intake/Output Summary (Last 24 hours) at 03/18/2024 1144 Last data filed at 03/18/2024 0606 Gross per 24 hour  Intake 240 ml  Output --  Net 240 ml   Filed Weights   03/17/24 2228  Weight: 79 kg    Scheduled Meds:  enoxaparin  (LOVENOX ) injection  40 mg Subcutaneous Q24H   Tdap  0.5 mL Intramuscular Once   Continuous Infusions:  vancomycin       Nutritional status     Body mass index is 22.98 kg/m.  Data Reviewed:  CBC: Recent Labs  Lab 03/17/24 0144  WBC 14.6*  HGB 11.9*  HCT 35.1*  MCV 83.6  PLT 197   Basic Metabolic Panel: Recent Labs  Lab 03/17/24 0144  NA 136  K 3.4*  CL 99  CO2 27  GLUCOSE 149*  BUN 6  CREATININE 0.81  CALCIUM 8.4*   GFR: Estimated Creatinine Clearance: 136.8 mL/min (by C-G formula based on SCr of 0.81 mg/dL). Liver Function Tests: Recent Labs  Lab 03/17/24 0144  AST 52*  ALT 69*  ALKPHOS 70  BILITOT 0.7  PROT 6.6  ALBUMIN 2.8*   No results for input(s): LIPASE, AMYLASE in the last 168 hours. No results for input(s): AMMONIA in the last 168 hours. Coagulation Profile: No results for input(s): INR, PROTIME in the last 168  hours. Cardiac Enzymes: No results for input(s): CKTOTAL, CKMB, CKMBINDEX, TROPONINI in the last 168 hours. BNP (last 3 results) No results for input(s): PROBNP in the last 8760 hours. HbA1C: No results for input(s): HGBA1C in the last 72 hours. CBG: No results for input(s): GLUCAP in the last 168 hours. Lipid Profile: No results for input(s): CHOL, HDL, LDLCALC, TRIG, CHOLHDL, LDLDIRECT in the last 72 hours. Thyroid Function Tests: No results for input(s): TSH, T4TOTAL, FREET4, T3FREE, THYROIDAB in the last 72 hours. Anemia Panel: No results for input(s): VITAMINB12, FOLATE, FERRITIN, TIBC, IRON, RETICCTPCT in the last 72 hours. Sepsis Labs: Recent Labs  Lab 03/18/24 0141  LATICACIDVEN 1.8    Recent Results (from the past 240 hours)  Culture, blood (routine x 2)     Status: None (Preliminary result)   Collection Time: 03/18/24  1:39 AM   Specimen: BLOOD  Result Value Ref Range Status   Specimen Description BLOOD BLOOD LEFT ARM  Final   Special Requests   Final    BOTTLES DRAWN AEROBIC ONLY Blood Culture adequate volume   Culture   Final    NO GROWTH < 12 HOURS Performed at [facility], [address] [place], [place] [address]    Report Status PENDING  Incomplete         Radiology Studies: DG Tibia/Fibula Left Result Date: 03/17/2024 CLINICAL DATA:  Abscess left lower leg. Redness and swelling for 2-3 days. EXAM: LEFT TIBIA AND FIBULA - 2 VIEW COMPARISON:  None Available. FINDINGS: No fracture. No evidence of erosion, periostitis or bone destruction. Knee and ankle alignment are maintained. Diffuse subcutaneous edema of the lower extremity. No convincing soft tissue gas or radiopaque foreign body. IMPRESSION: Diffuse subcutaneous edema of the lower extremity. No radiographic evidence of osteomyelitis. Electronically Signed   By: Andrea Gasman M.D.   On: 03/17/2024 22:53           LOS: 0 days   Time  spent= 35 mins    Burgess [name], MD [facility]  If 7PM-7AM, please contact night-coverage  03/18/2024, 11:44 AM

## 2024-03-18 NOTE — ED Notes (Signed)
 IV team at bedside

## 2024-03-18 NOTE — ED Provider Notes (Signed)
 [place] EMERGENCY DEPARTMENT AT [facility] Provider Note   CSN: [id number] Arrival date & time: 03/17/24  2207     Patient presents with: Leg Pain   Jon Castro is a 38 y.o. male.   38 year old male presents with complaint of abscess in his left lower leg.  States this started several days ago, unknown if he has had a fever.  No active drainage from the area.  Past medical history significant for IV drug abuse, MRSA bacteremia, fungemia.       Prior to Admission medications   Medication Sig Start Date End Date Taking? Authorizing Provider  cloNIDine  (CATAPRES ) 0.1 MG tablet Take 1 tablet (0.1 mg total) by mouth 3 (three) times daily for 5 days. Patient not taking: Reported on 03/24/2019 03/08/19 03/24/19  Ruthe Cornet, DO  traZODone  (DESYREL ) 50 MG tablet Take 1 tablet (50 mg total) by mouth at bedtime as needed for sleep. Patient not taking: Reported on 02/21/2019 02/11/19 03/24/19  Wonda Clarita BRAVO, NP    Allergies: Codeine, Hydrocodone , and Tramadol     Review of Systems Negative except as per HPI Updated Vital Signs BP (!) 123/95   Pulse 93   Temp (!) 102.6 F (39.2 C)   Resp 13   Ht 6' 1 (1.854 m)   Wt 79 kg   SpO2 100%   BMI 22.98 kg/m   Physical Exam Vitals and nursing note reviewed.  Constitutional:      General: He is not in acute distress.    Appearance: He is well-developed. He is not diaphoretic.  HENT:     Head: Normocephalic and atraumatic.  Cardiovascular:     Rate and Rhythm: Normal rate and regular rhythm.     Pulses: Normal pulses.     Heart sounds: Normal heart sounds.  Pulmonary:     Effort: Pulmonary effort is normal.     Breath sounds: Normal breath sounds.  Musculoskeletal:        General: Swelling and tenderness present.     Comments: Left lower leg with circumferential erythema, fluctuance with eschar noted anterior/medially, no active drainage   Skin:    Findings: Erythema present.  Neurological:     Mental  Status: He is alert and oriented to person, place, and time.  Psychiatric:        Behavior: Behavior normal.     (all labs ordered are listed, but only abnormal results are displayed) Labs Reviewed  COMPREHENSIVE METABOLIC PANEL WITH GFR - Abnormal; Notable for the following components:      Result Value   Potassium 3.4 (*)    Glucose, Bld 149 (*)    Calcium 8.4 (*)    Albumin 2.8 (*)    AST 52 (*)    ALT 69 (*)    All other components within normal limits  CBC - Abnormal; Notable for the following components:   WBC 14.6 (*)    RBC 4.20 (*)    Hemoglobin 11.9 (*)    HCT 35.1 (*)    All other components within normal limits  CULTURE, BLOOD (ROUTINE X 2)  CULTURE, BLOOD (ROUTINE X 2)  AEROBIC CULTURE W GRAM STAIN (SUPERFICIAL SPECIMEN)  URINALYSIS, ROUTINE W REFLEX MICROSCOPIC  I-STAT CG4 LACTIC ACID, ED    EKG: None  Radiology: DG Tibia/Fibula Left Result Date: 03/17/2024 CLINICAL DATA:  Abscess left lower leg. Redness and swelling for 2-3 days. EXAM: LEFT TIBIA AND FIBULA - 2 VIEW COMPARISON:  None Available. FINDINGS: No fracture. No  evidence of erosion, periostitis or bone destruction. Knee and ankle alignment are maintained. Diffuse subcutaneous edema of the lower extremity. No convincing soft tissue gas or radiopaque foreign body. IMPRESSION: Diffuse subcutaneous edema of the lower extremity. No radiographic evidence of osteomyelitis. Electronically Signed   By: Andrea Gasman M.D.   On: 03/17/2024 22:53     .Incision and Drainage  Date/Time: 03/18/2024 2:18 AM  Performed by: Beverley Leita LABOR, PA-C Authorized by: Beverley Leita LABOR, PA-C   Consent:    Consent obtained:  Verbal   Consent given by:  Patient   Risks discussed:  Bleeding, incomplete drainage, pain and damage to other organs   Alternatives discussed:  No treatment Universal protocol:    Procedure explained and questions answered to patient or proxy's satisfaction: yes     Relevant documents present and  verified: yes     Test results available : yes     Imaging studies available: yes     Required blood products, implants, devices, and special equipment available: yes     Site/side marked: yes     Immediately prior to procedure, a time out was called: yes     Patient identity confirmed:  Verbally with patient Location:    Type:  Abscess   Size:  4cm x 4cm   Location:  Lower extremity   Lower extremity location:  Leg   Leg location:  L lower leg Pre-procedure details:    Skin preparation:  Betadine Sedation:    Sedation type:  None Anesthesia:    Anesthesia method:  Local infiltration   Local anesthetic:  Lidocaine  2% WITH epi Procedure type:    Complexity:  Complex Procedure details:    Incision types:  Single straight   Incision depth:  Subcutaneous   Wound management:  Probed and deloculated, irrigated with saline and extensive cleaning   Drainage:  Purulent   Drainage amount:  Copious   Packing materials:  None Post-procedure details:    Procedure completion:  Tolerated well, no immediate complications    Medications Ordered in the ED  Tdap (BOOSTRIX ) injection 0.5 mL (0.5 mLs Intramuscular Not Given 03/18/24 0205)  vancomycin  (VANCOREADY) IVPB 1750 mg/350 mL (has no administration in time range)  lidocaine -EPINEPHrine  (XYLOCAINE  W/EPI) 2 %-1:200000 (PF) injection 10 mL (10 mLs Infiltration Given by Other 03/18/24 0224)  acetaminophen  (TYLENOL ) tablet 650 mg (650 mg Oral Given 03/18/24 0220)  sodium chloride  0.9 % bolus 1,000 mL (1,000 mLs Intravenous New Bag/Given 03/18/24 0227)  ondansetron  (ZOFRAN ) injection 4 mg (4 mg Intravenous Given 03/18/24 0220)  morphine  (PF) 4 MG/ML injection 4 mg (4 mg Intravenous Given 03/18/24 0222)                                    Medical Decision Making Risk OTC drugs. Prescription drug management. Decision regarding hospitalization.   This patient presents to the ED for concern of abscess, this involves an extensive number of  treatment options, and is a complaint that carries with it a high risk of complications and morbidity.  The differential diagnosis includes abscess, cellulitis    Co morbidities / Chronic conditions that complicate the patient evaluation  Chronic hepatitis C, IV drug abuse, MRSA bacteremia, endocarditis of the tricuspid valve   Additional history obtained:  Additional history obtained from EMR External records from outside source obtained and reviewed including prior labs on file. Last tetanus July 2020, offered tetanus today,  patient refuses, states maybe later but he doesn't want the td because it will hurt. .   Lab Tests:  I Ordered, and personally interpreted labs.  The pertinent results include: Lactic acid 1.8.  CBC with leukocytosis white count 14.6. CMP with mildly elevated AST/ALT, normal renal function.    Imaging Studies ordered:  I ordered imaging studies including XR tib/fib left  I independently visualized and interpreted imaging which showed soft tissue swelling I agree with the radiologist interpretation   Problem List / ED Course / Critical interventions / Medication management  38 yo male with left lower leg infection, concern for abscess hx MRSA. Cellulitis with abscess. I&D. Febrile after arrival, provided with tylenol  for fever. IV abx and admit.  I ordered medication including vancomycin , morphine /zofran , tylenol    Reevaluation of the patient after these medicines showed that the patient became febrile, provided with Tylenol . IV abx ordered with plan to admit.  I have reviewed the patients home medicines and have made adjustments as needed   Consultations Obtained:  I requested consultation with the hospitalist, Dr. Keturah,  and discussed lab and imaging findings as well as pertinent plan - they recommend: will consult for admission, add on wound cx.    Social Determinants of Health:  [name]   Test / Admission - Considered:  admit      Final  diagnoses:  Abscess of left lower leg  Cellulitis of left lower extremity  Fever, unspecified fever cause    ED Discharge Orders     None          Beverley Leita LABOR, PA-C 03/18/24 0240    Haze Lonni PARAS, MD 03/18/24 [phone]

## 2024-03-18 NOTE — H&P (Signed)
 History and Physical    Echo Allsbrook FMW:[id number] DOB: [birth year] DOA: 03/17/2024  PCP: Patient, [name] Per   Patient coming from: Home   Chief Complaint:  Chief Complaint  Patient presents with   Leg Pain    HPI:  Jon Castro is a 38 y.o. 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 Denies any active IV use or skin popping. Reports he still actively is using heroin, although snorts it. He is not interested in cessation. Denies fever / chills, prior to fever in ED. No other recent illness.    Review of Systems:  ROS complete and negative except as marked above   Allergies  Allergen Reactions   Codeine Nausea And Vomiting   Hydrocodone  Itching   Tramadol  Other (See Comments)    messes with head    Prior to Admission medications   Medication Sig Start Date End Date Taking? Authorizing Provider  cloNIDine  (CATAPRES ) 0.1 MG tablet Take 1 tablet (0.1 mg total) by mouth 3 (three) times daily for 5 days. Patient not taking: Reported on 03/24/2019 03/08/19 03/24/19  Ruthe Cornet, DO  traZODone  (DESYREL ) 50 MG tablet Take 1 tablet (50 mg total) by mouth at bedtime as needed for sleep. Patient not taking: Reported on 02/21/2019 02/11/19 03/24/19  Wonda Clarita BRAVO, NP    Past Medical History:  Diagnosis Date   Back pain    Cellulitis 10/13/2019   Drug abuse (HCC)    Fungemia 05/02/2019   IV drug abuse (HCC)    IVDU (intravenous drug user)    LFTs abnormal    MRSA bacteremia 04/29/2019   Overdose of heroin, accidental or unintentional, initial encounter (HCC) 05/31/2019   Suicidal ideation 10/30/2019    Past Surgical History:  Procedure Laterality Date   I & D EXTREMITY Right 04/28/2019   Procedure: IRRIGATION AND  DEBRIDEMENT ARM ABSCESS;  Surgeon: Beverley Evalene BIRCH, MD;  Location: [facility] OR;  Service: Orthopedics;  Laterality: Right;   I & D EXTREMITY Right 04/01/2020   Procedure: IRRIGATION AND DEBRIDEMENT OF ELBOW;  Surgeon: Shari Easter, MD;  Location: [facility] OR;  Service: Orthopedics;  Laterality: Right;   TEE WITHOUT CARDIOVERSION N/A 05/03/2019   Procedure: TRANSESOPHAGEAL ECHOCARDIOGRAM (TEE);  Surgeon: Jeffrie Oneil BROCKS, MD;  [facility] ENDOSCOPY;  Service: Cardiovascular;  Laterality: N/A;   TEE WITHOUT CARDIOVERSION N/A 04/14/2020   Procedure: TRANSESOPHAGEAL ECHOCARDIOGRAM (TEE);  Surgeon: Lonni Slain, MD;  Location: [facility] ENDOSCOPY;  Service: Cardiovascular;  Laterality: N/A;     reports that he has been smoking cigarettes. He has never used smokeless tobacco. He reports current alcohol use. He reports current drug use. Drugs: IV, Cocaine, Amphetamines, Marijuana, Heroin, and Methamphetamines.  History reviewed. No pertinent family history.   Physical Exam: Vitals:   03/18/24 0330 03/18/24 0345 03/18/24 0406 03/18/24 0415  BP: 116/79 125/74  130/74  Pulse: 89 82  77  Resp: 18 14    Temp:   99.1 F (37.3 C)   TempSrc:   Oral   SpO2: 100% 100%  92%  Weight:      Height:        Gen: Awake, alert, NAD, disheveled   CV: Regular, normal S1, S2, 1/6 SEM  Resp: Normal WOB, CTAB  Abd: Flat, normoactive,  nontender MSK: Symmetric, no edema  Skin: erythema and edema involving the Left lower leg, in the mid anterior portion there is an abscess which is draining purulent material.  Neuro: Alert and interactive  Psych: euthymic, appropriate    Data review:   Labs reviewed, notable for:   Lactate 1.8  K 3.4  AST 52, ALT 69  WBC 14   Micro:  Results for orders placed or performed during the hospital encounter of 08/08/23  Culture, blood (routine x 2)     Status: None   Collection Time: 08/08/23 10:36 PM   Specimen: BLOOD LEFT FOREARM  Result Value Ref Range Status   Specimen  Description BLOOD LEFT FOREARM  Final   Special Requests   Final    BOTTLES DRAWN AEROBIC AND ANAEROBIC Blood Culture adequate volume   Culture   Final    NO GROWTH 5 DAYS Performed at [facility], [address], [place], [place] [address]    Report Status 08/14/2023 FINAL  Final  MRSA Next Gen by PCR, Nasal     Status: Abnormal   Collection Time: 08/10/23  3:53 AM   Specimen: Nasal Mucosa; Nasal Swab  Result Value Ref Range Status   MRSA by PCR Next Gen DETECTED (A) NOT DETECTED Final    Comment: RESULT CALLED TO, READ BACK BY AND VERIFIED WITH: DOLAN RN 12/122024 @ 0606 BY AB (NOTE) The GeneXpert MRSA Assay (FDA approved for NASAL specimens only), is one component of a comprehensive MRSA colonization surveillance program. It is not intended to diagnose MRSA infection nor to guide or monitor treatment for MRSA infections. Test performance is not FDA approved in patients less than 44 years old. Performed at [facility], [address] [place], [place] [address]     Imaging reviewed:  DG Tibia/Fibula Left Result Date: 03/17/2024 CLINICAL DATA:  Abscess left lower leg. Redness and swelling for 2-3 days. EXAM: LEFT TIBIA AND FIBULA - 2 VIEW COMPARISON:  None Available. FINDINGS: No fracture. No evidence of erosion, periostitis or bone destruction. Knee and ankle alignment are maintained. Diffuse subcutaneous edema of the lower extremity. No convincing soft tissue gas or radiopaque foreign body. IMPRESSION: Diffuse subcutaneous edema of the lower extremity. No radiographic evidence of osteomyelitis. Electronically Signed   By: Andrea Gasman M.D.   On: 03/17/2024 22:53     ED Course:  I+D of abscess performed by ED, sent for culture. Treated with vancomycin      Assessment/Plan:  38 y.o. 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   cellulitis and abscess of L lower leg, s/p I+D of abscess 7/21  Denies skin popping / injection use around site. Febrile to T max 102.4F, WBC 14. XR tib fib with diffuse edema, no osteomyelitis.  - Continue vancomycin , pharmacy to dose  - F/u wound Cx, blood cultures  - Needs Tdap, he declined initial attempt in ED - For pain, tylenol  prn mild, toradol  for moderate, oxycodone  5 mg PO q6 hr prn for severe. Discussed wanting to avoid escalation in opiates due to his IVDU history.   Opiate use disorder, severe  Hx polysubstance use, former IVDU  He is precontemplative about cessation and not interested in this, against suboxone and methadone.  -  Counseled briefly on cessation, not interested at this time.  - check UDS  - Toc for substance use resources if he wants to pursue in future.   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  Chronic hepatitis C, chronic hepatocellular liver injury: He should have surveillance imaging of his liver outpatient in setting of chronic Hep C and should be considered for treatment if he can sustain remission from drug use.   Body mass index is 22.98 kg/m.    DVT prophylaxis:  Lovenox  Code Status:  Full Code Diet:  Diet Orders (From admission, onward)     Start     Ordered   03/18/24 0434  Diet regular Room service appropriate? Yes; Fluid consistency: Thin  Diet effective now       Question Answer Comment  Room service appropriate? Yes   Fluid consistency: Thin      03/18/24 0435           Family Communication:  None   Consults:  None   Admission status:   Inpatient, Med-Surg  Severity of Illness: The appropriate patient status for this patient is INPATIENT. Inpatient status is judged to be reasonable and necessary in order to provide the required intensity of service to ensure the patient's  safety. The patient's presenting symptoms, physical exam findings, and initial radiographic and laboratory data in the context of their chronic comorbidities is felt to place them at high risk for further clinical deterioration. Furthermore, it is not anticipated that the patient will be medically stable for discharge from the hospital within 2 midnights of admission.   * I certify that at the point of admission it is my clinical judgment that the patient will require inpatient hospital care spanning beyond 2 midnights from the point of admission due to high intensity of service, high risk for further deterioration and high frequency of surveillance required.*   Dorn Dawson, MD [facility]  How to contact the [facility] Attending or Consulting provider 7A - 7P or covering provider during after hours 7P -7A, for this patient.  Check the care team in [facility] and look for a) attending/consulting [facility] provider listed and b) the [facility] team listed Log into [email or web address] and use [place]'s universal password to access. If you do not have the password, please contact the hospital operator. Locate the [facility] provider you are looking for under [facility] and page to a number that you can be directly reached. If you still have difficulty reaching the provider, please page the [facility] (Director on Call) for the Hospitalists listed on amion for assistance.  03/18/2024, 4:35 AM

## 2024-03-18 NOTE — ED Notes (Addendum)
 6N notified pt coming to room 14

## 2024-03-18 NOTE — Discharge Summary (Signed)
 Physician Discharge Summary  Jon Castro FMW:[id number] DOB: 01-23-1986 DOA: 03/17/2024  PCP: Patient, [name] Per  Admit date: 03/17/2024 Discharge date: 03/18/2024  Admitted From: HOME Disposition:  LEFT AMA  Recommendations for Outpatient Follow-up:  LEFT AMA   Brief/Interim Summary: Brief Narrative:  38 year old with history of polysubstance abuse heroin and cocaine, prior MSSA bacteremia, TV endocarditis, chronic hepatitis C comes to the hospital with left lower extremity cellulitis with concerns of abscess. X-ray of the hip/hip shows diffuse edema without evidence of osteomyelitis.  Status post I&D in the ER.  Currently on IV vancomycin  Unfortunately later left AMA. Understood the risk of leaving AMA   Assessment & Plan:  Principal Problem:   Cellulitis and abscess of leg     Sepsis secondary to cellulitis and abscess of L lower leg, s/p I+D of abscess 7/21  X-ray of the hip/hip shows diffuse edema without evidence of osteomyelitis.  Status post I&D in the ER.  Currently on IV vancomycin  - Follow culture data. - Patient declined Tdap - Pain control  Hypokalemia - As needed repletion   Opiate use disorder, severe  Hx polysubstance use, former IVDU  -Counseled to quit using drugs.  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   Chronic hepatitis C, chronic hepatocellular liver injury: He should have surveillance imaging of his liver outpatient in setting of chronic Hep C and should be considered for treatment if he can sustain remission from drug use.    Body mass index is 22.98 kg/m.   Unfortunately patient is declining any further blood work at this time   DVT prophylaxis:  Lovenox  Code Status:  Full Code Family Communication:   Status is: Inpatient LEFT AMA  Discharge Diagnoses:  Principal Problem:   Cellulitis and abscess of leg      Discharge Exam: Vitals:   03/18/24 0540 03/18/24 0756  BP:  134/86 129/76  Pulse: 89 76  Resp:  16  Temp: 98.8 F (37.1 C) 98.3 F (36.8 C)  SpO2: 100% 100%   Vitals:   03/18/24 0500 03/18/24 0528 03/18/24 0540 03/18/24 0756  BP: (!) 128/93  134/86 129/76  Pulse: 96  89 76  Resp:    16  Temp:  98.6 F (37 C) 98.8 F (37.1 C) 98.3 F (36.8 C)  TempSrc:   Oral Oral  SpO2: 91%  100% 100%  Weight:      Height:          Discharge Instructions     Allergies  Allergen Reactions   Codeine Nausea And Vomiting   Hydrocodone  Itching   Tramadol  Other (See Comments)    messes with head    You were cared for by a hospitalist during your hospital stay. If you have any questions about your discharge medications or the care you received while you were in the hospital after you are discharged, you can call the unit and asked to speak with the hospitalist on call if the hospitalist that took care of you is not available. Once you are discharged, your primary care physician will handle any further medical issues. Please note that no refills for any discharge medications will be authorized once you are discharged, as it is imperative that you return to your primary care physician (or establish a relationship with a primary care physician if you do not have one) for your aftercare needs so that they can reassess your need for medications and monitor your lab  values.  You were cared for by a hospitalist during your hospital stay. If you have any questions about your discharge medications or the care you received while you were in the hospital after you are discharged, you can call the unit and asked to speak with the hospitalist on call if the hospitalist that took care of you is not available. Once you are discharged, your primary care physician will handle any further medical issues. Please note that NO REFILLS for any discharge medications will be authorized once you are discharged, as it is imperative that you return to your primary care physician (or  establish a relationship with a primary care physician if you do not have one) for your aftercare needs so that they can reassess your need for medications and monitor your lab values.  Please request your Prim.MD to go over all Hospital Tests and Procedure/Radiological results at the follow up, please get all Hospital records sent to your Prim MD by signing hospital release before you go home.  Get CBC, CMP, 2 view Chest X ray checked  by Primary MD during your next visit or SNF MD in 5-7 days ( we routinely change or add medications that can affect your baseline labs and fluid status, therefore we recommend that you get the mentioned basic workup next visit with your PCP, your PCP may decide not to get them or add new tests based on their clinical decision)  On your next visit with your primary care physician please Get Medicines reviewed and adjusted.  If you experience worsening of your admission symptoms, develop shortness of breath, life threatening emergency, suicidal or homicidal thoughts you must seek medical attention immediately by calling 911 or calling your MD immediately  if symptoms less severe.  You Must read complete instructions/literature along with all the possible adverse reactions/side effects for all the Medicines you take and that have been prescribed to you. Take any new Medicines after you have completely understood and accpet all the possible adverse reactions/side effects.   Do not drive, operate heavy machinery, perform activities at heights, swimming or participation in water activities or provide baby sitting services if your were admitted for syncope or siezures until you have seen by Primary MD or a Neurologist and advised to do so again.  Do not drive when taking Pain medications.   Procedures/Studies: DG Tibia/Fibula Left Result Date: 03/17/2024 CLINICAL DATA:  Abscess left lower leg. Redness and swelling for 2-3 days. EXAM: LEFT TIBIA AND FIBULA - 2 VIEW  COMPARISON:  None Available. FINDINGS: No fracture. No evidence of erosion, periostitis or bone destruction. Knee and ankle alignment are maintained. Diffuse subcutaneous edema of the lower extremity. No convincing soft tissue gas or radiopaque foreign body. IMPRESSION: Diffuse subcutaneous edema of the lower extremity. No radiographic evidence of osteomyelitis. Electronically Signed   By: Andrea Gasman M.D.   On: 03/17/2024 22:53     The results of significant diagnostics from this hospitalization (including imaging, microbiology, ancillary and laboratory) are listed below for reference.     Microbiology: Recent Results (from the past 240 hours)  Culture, blood (routine x 2)     Status: None (Preliminary result)   Collection Time: 03/18/24  1:39 AM   Specimen: BLOOD  Result Value Ref Range Status   Specimen Description BLOOD BLOOD LEFT ARM  Final   Special Requests   Final    BOTTLES DRAWN AEROBIC ONLY Blood Culture adequate volume   Culture   Final    NO  GROWTH < 12 HOURS Performed at [facility], [address], [place], [place] [address]    Report Status PENDING  Incomplete     Labs: BNP (last 3 results) No results for input(s): BNP in the last 8760 hours. Basic Metabolic Panel: Recent Labs  Lab 03/17/24 0144  NA 136  K 3.4*  CL 99  CO2 27  GLUCOSE 149*  BUN 6  CREATININE 0.81  CALCIUM 8.4*   Liver Function Tests: Recent Labs  Lab 03/17/24 0144  AST 52*  ALT 69*  ALKPHOS 70  BILITOT 0.7  PROT 6.6  ALBUMIN 2.8*   No results for input(s): LIPASE, AMYLASE in the last 168 hours. No results for input(s): AMMONIA in the last 168 hours. CBC: Recent Labs  Lab 03/17/24 0144  WBC 14.6*  HGB 11.9*  HCT 35.1*  MCV 83.6  PLT 197   Cardiac Enzymes: No results for input(s): CKTOTAL, CKMB, CKMBINDEX, TROPONINI in the last 168 hours. BNP: Invalid input(s): POCBNP CBG: No results for input(s): GLUCAP in the last 168  hours. D-Dimer No results for input(s): DDIMER in the last 72 hours. Hgb A1c No results for input(s): HGBA1C in the last 72 hours. Lipid Profile No results for input(s): CHOL, HDL, LDLCALC, TRIG, CHOLHDL, LDLDIRECT in the last 72 hours. Thyroid function studies No results for input(s): TSH, T4TOTAL, T3FREE, THYROIDAB in the last 72 hours.  Invalid input(s): FREET3 Anemia work up No results for input(s): VITAMINB12, FOLATE, FERRITIN, TIBC, IRON, RETICCTPCT in the last 72 hours. Urinalysis    Component Value Date/Time   COLORURINE YELLOW 03/17/2024 0330   APPEARANCEUR CLEAR 03/17/2024 0330   LABSPEC 1.012 03/17/2024 0330   PHURINE 6.0 03/17/2024 0330   GLUCOSEU NEGATIVE 03/17/2024 0330   HGBUR NEGATIVE 03/17/2024 0330   BILIRUBINUR NEGATIVE 03/17/2024 0330   KETONESUR NEGATIVE 03/17/2024 0330   PROTEINUR NEGATIVE 03/17/2024 0330   UROBILINOGEN 1.0 02/09/2011 2210   NITRITE NEGATIVE 03/17/2024 0330   LEUKOCYTESUR NEGATIVE 03/17/2024 0330   Sepsis Labs Recent Labs  Lab 03/17/24 0144  WBC 14.6*   Microbiology Recent Results (from the past 240 hours)  Culture, blood (routine x 2)     Status: None (Preliminary result)   Collection Time: 03/18/24  1:39 AM   Specimen: BLOOD  Result Value Ref Range Status   Specimen Description BLOOD BLOOD LEFT ARM  Final   Special Requests   Final    BOTTLES DRAWN AEROBIC ONLY Blood Culture adequate volume   Culture   Final    NO GROWTH < 12 HOURS Performed at [facility], [address], [place], [place] [address]    Report Status PENDING  Incomplete     Time coordinating discharge:  I have spent 35 minutes face to face with the patient and on the ward discussing the patients care, assessment, plan and disposition with other care givers. >50% of the time was devoted counseling the patient about the risks and benefits of treatment/Discharge disposition and coordinating care.    SIGNED:   Burgess [name], MD  [facility] 03/18/2024, 12:47 PM   If 7PM-7AM, please contact night-coverage

## 2024-03-24 LAB — AEROBIC CULTURE W GRAM STAIN (SUPERFICIAL SPECIMEN)

## 2024-03-25 LAB — CULTURE, BLOOD (ROUTINE X 2)
Culture: NO GROWTH
Special Requests: ADEQUATE
